# Patient Record
Sex: Female | Born: 1935
Health system: Southern US, Community
[De-identification: ages and names within clinical notes are randomized; demographics above are authoritative.]

## PROBLEM LIST (undated history)

## (undated) DIAGNOSIS — D649 Anemia, unspecified: Secondary | ICD-10-CM

## (undated) DIAGNOSIS — R233 Spontaneous ecchymoses: Secondary | ICD-10-CM

## (undated) DIAGNOSIS — R609 Edema, unspecified: Secondary | ICD-10-CM

## (undated) DIAGNOSIS — K219 Gastro-esophageal reflux disease without esophagitis: Secondary | ICD-10-CM

## (undated) DIAGNOSIS — D693 Immune thrombocytopenic purpura: Secondary | ICD-10-CM

## (undated) DIAGNOSIS — M1990 Unspecified osteoarthritis, unspecified site: Secondary | ICD-10-CM

## (undated) DIAGNOSIS — I219 Acute myocardial infarction, unspecified: Secondary | ICD-10-CM

## (undated) DIAGNOSIS — R238 Other skin changes: Secondary | ICD-10-CM

## (undated) DIAGNOSIS — C44511 Basal cell carcinoma of skin of breast: Secondary | ICD-10-CM

## (undated) DIAGNOSIS — R351 Nocturia: Secondary | ICD-10-CM

## (undated) DIAGNOSIS — I639 Cerebral infarction, unspecified: Secondary | ICD-10-CM

## (undated) DIAGNOSIS — H409 Unspecified glaucoma: Secondary | ICD-10-CM

## (undated) DIAGNOSIS — E119 Type 2 diabetes mellitus without complications: Secondary | ICD-10-CM

## (undated) DIAGNOSIS — Z8719 Personal history of other diseases of the digestive system: Secondary | ICD-10-CM

## (undated) DIAGNOSIS — R600 Localized edema: Secondary | ICD-10-CM

## (undated) DIAGNOSIS — R42 Dizziness and giddiness: Secondary | ICD-10-CM

## (undated) DIAGNOSIS — J439 Emphysema, unspecified: Secondary | ICD-10-CM

## (undated) DIAGNOSIS — E785 Hyperlipidemia, unspecified: Secondary | ICD-10-CM

## (undated) DIAGNOSIS — I10 Essential (primary) hypertension: Secondary | ICD-10-CM

## (undated) DIAGNOSIS — L309 Dermatitis, unspecified: Secondary | ICD-10-CM

## (undated) DIAGNOSIS — Z9989 Dependence on other enabling machines and devices: Secondary | ICD-10-CM

## (undated) HISTORY — PX: BREAST EXCISIONAL BIOPSY: SUR124

## (undated) HISTORY — DX: Basal cell carcinoma of skin of breast: C44.511

## (undated) HISTORY — PX: ESOPHAGOGASTRODUODENOSCOPY: SHX1529

## (undated) HISTORY — PX: BREAST LUMPECTOMY: SHX2

## (undated) HISTORY — DX: Unspecified glaucoma: H40.9

## (undated) HISTORY — DX: Essential (primary) hypertension: I10

## (undated) HISTORY — DX: Cerebral infarction, unspecified: I63.9

## (undated) HISTORY — DX: Type 2 diabetes mellitus without complications: E11.9

## (undated) HISTORY — DX: Hyperlipidemia, unspecified: E78.5

## (undated) HISTORY — PX: COLONOSCOPY: SHX174

## (undated) HISTORY — PX: TONSILLECTOMY: SUR1361

## (undated) HISTORY — PX: CATARACT EXTRACTION: SUR2

## (undated) HISTORY — PX: REFRACTIVE SURGERY: SHX103

## (undated) HISTORY — DX: Immune thrombocytopenic purpura: D69.3

---

## 1995-03-14 HISTORY — PX: MYOMECTOMY: SHX85

## 1996-02-07 DIAGNOSIS — C44511 Basal cell carcinoma of skin of breast: Secondary | ICD-10-CM

## 1996-02-07 HISTORY — DX: Basal cell carcinoma of skin of breast: C44.511

## 1997-07-29 ENCOUNTER — Ambulatory Visit (HOSPITAL_COMMUNITY): Admission: RE | Admit: 1997-07-29 | Discharge: 1997-07-29 | Payer: Self-pay | Admitting: Obstetrics and Gynecology

## 1997-08-27 ENCOUNTER — Other Ambulatory Visit: Admission: RE | Admit: 1997-08-27 | Discharge: 1997-08-27 | Payer: Self-pay | Admitting: Obstetrics and Gynecology

## 1998-08-12 ENCOUNTER — Encounter: Payer: Self-pay | Admitting: *Deleted

## 1998-08-12 ENCOUNTER — Ambulatory Visit (HOSPITAL_COMMUNITY): Admission: RE | Admit: 1998-08-12 | Discharge: 1998-08-12 | Payer: Self-pay | Admitting: *Deleted

## 1999-03-02 ENCOUNTER — Encounter (INDEPENDENT_AMBULATORY_CARE_PROVIDER_SITE_OTHER): Payer: Self-pay

## 1999-03-02 ENCOUNTER — Other Ambulatory Visit: Admission: RE | Admit: 1999-03-02 | Discharge: 1999-03-02 | Payer: Self-pay | Admitting: Obstetrics and Gynecology

## 1999-03-31 ENCOUNTER — Ambulatory Visit (HOSPITAL_COMMUNITY): Admission: RE | Admit: 1999-03-31 | Discharge: 1999-03-31 | Payer: Self-pay | Admitting: Obstetrics and Gynecology

## 1999-03-31 ENCOUNTER — Encounter (INDEPENDENT_AMBULATORY_CARE_PROVIDER_SITE_OTHER): Payer: Self-pay | Admitting: Specialist

## 2002-10-29 ENCOUNTER — Ambulatory Visit (HOSPITAL_COMMUNITY): Admission: RE | Admit: 2002-10-29 | Discharge: 2002-10-29 | Payer: Self-pay | Admitting: *Deleted

## 2003-10-23 ENCOUNTER — Encounter: Admission: RE | Admit: 2003-10-23 | Discharge: 2003-10-23 | Payer: Self-pay | Admitting: Obstetrics and Gynecology

## 2006-02-06 HISTORY — PX: PARATHYROIDECTOMY: SHX19

## 2006-04-19 ENCOUNTER — Ambulatory Visit (HOSPITAL_COMMUNITY): Admission: RE | Admit: 2006-04-19 | Discharge: 2006-04-19 | Payer: Self-pay | Admitting: Hematology and Oncology

## 2006-07-05 ENCOUNTER — Ambulatory Visit (HOSPITAL_COMMUNITY): Admission: RE | Admit: 2006-07-05 | Discharge: 2006-07-06 | Payer: Self-pay | Admitting: Surgery

## 2006-07-05 ENCOUNTER — Encounter (INDEPENDENT_AMBULATORY_CARE_PROVIDER_SITE_OTHER): Payer: Self-pay | Admitting: Surgery

## 2006-08-09 ENCOUNTER — Encounter: Admission: RE | Admit: 2006-08-09 | Discharge: 2006-08-09 | Payer: Self-pay | Admitting: Internal Medicine

## 2007-03-10 DIAGNOSIS — D693 Immune thrombocytopenic purpura: Secondary | ICD-10-CM

## 2007-03-10 HISTORY — DX: Immune thrombocytopenic purpura: D69.3

## 2007-04-01 ENCOUNTER — Ambulatory Visit: Payer: Self-pay | Admitting: Oncology

## 2007-04-09 LAB — CHCC SMEAR

## 2007-04-09 LAB — COMPREHENSIVE METABOLIC PANEL
ALT: 12 U/L (ref 0–35)
AST: 17 U/L (ref 0–37)
CO2: 25 mEq/L (ref 19–32)
Calcium: 9.1 mg/dL (ref 8.4–10.5)
Chloride: 105 mEq/L (ref 96–112)
Sodium: 141 mEq/L (ref 135–145)
Total Protein: 6.9 g/dL (ref 6.0–8.3)

## 2007-04-09 LAB — CBC WITH DIFFERENTIAL/PLATELET
Basophils Absolute: 0 10*3/uL (ref 0.0–0.1)
EOS%: 0.8 % (ref 0.0–7.0)
Eosinophils Absolute: 0.1 10*3/uL (ref 0.0–0.5)
HCT: 42 % (ref 34.8–46.6)
HGB: 14.5 g/dL (ref 11.6–15.9)
MONO#: 0.5 10*3/uL (ref 0.1–0.9)
NEUT#: 4.3 10*3/uL (ref 1.5–6.5)
NEUT%: 65 % (ref 39.6–76.8)
RDW: 13.1 % (ref 11.3–14.5)
WBC: 6.6 10*3/uL (ref 3.9–10.0)
lymph#: 1.7 10*3/uL (ref 0.9–3.3)

## 2007-04-09 LAB — RHEUMATOID FACTOR: Rhuematoid fact SerPl-aCnc: <20 IU/mL (ref 0–20)

## 2007-04-09 LAB — LACTATE DEHYDROGENASE: LDH: 227 U/L (ref 94–250)

## 2007-04-10 LAB — ANA: Anti Nuclear Antibody(ANA): POSITIVE — AB

## 2007-04-10 LAB — PROTHROMBIN TIME: INR: 1 (ref 0.0–1.5)

## 2007-04-10 LAB — ANTI-NUCLEAR AB-TITER (ANA TITER)

## 2007-04-12 LAB — BASIC METABOLIC PANEL
CO2: 23 mEq/L (ref 19–32)
Chloride: 106 mEq/L (ref 96–112)
Creatinine, Ser: 0.95 mg/dL (ref 0.40–1.20)
Potassium: 4.2 mEq/L (ref 3.5–5.3)
Sodium: 141 mEq/L (ref 135–145)

## 2007-04-12 LAB — CBC WITH DIFFERENTIAL/PLATELET
BASO%: 0.3 % (ref 0.0–2.0)
EOS%: 0.2 % (ref 0.0–7.0)
HCT: 41.1 % (ref 34.8–46.6)
MCH: 29.6 pg (ref 26.0–34.0)
MCHC: 35.1 g/dL (ref 32.0–36.0)
MONO#: 0.2 10*3/uL (ref 0.1–0.9)
NEUT%: 84.5 % — ABNORMAL HIGH (ref 39.6–76.8)
RDW: 13.6 % (ref 11.3–14.5)
WBC: 7.3 10*3/uL (ref 3.9–10.0)
lymph#: 0.9 10*3/uL (ref 0.9–3.3)

## 2007-04-15 LAB — CBC WITH DIFFERENTIAL/PLATELET
Basophils Absolute: 0 10*3/uL (ref 0.0–0.1)
EOS%: 0.1 % (ref 0.0–7.0)
Eosinophils Absolute: 0 10*3/uL (ref 0.0–0.5)
HCT: 41.1 % (ref 34.8–46.6)
HGB: 14.3 g/dL (ref 11.6–15.9)
LYMPH%: 14.7 % (ref 14.0–48.0)
MCH: 29.5 pg (ref 26.0–34.0)
MCV: 85.1 fL (ref 81.0–101.0)
MONO%: 1.1 % (ref 0.0–13.0)
NEUT#: 6.9 10*3/uL — ABNORMAL HIGH (ref 1.5–6.5)
NEUT%: 83.9 % — ABNORMAL HIGH (ref 39.6–76.8)
Platelets: 89 10*3/uL — ABNORMAL LOW (ref 145–400)
RDW: 13.6 % (ref 11.3–14.5)

## 2007-04-15 LAB — BASIC METABOLIC PANEL
BUN: 17 mg/dL (ref 6–23)
CO2: 22 mEq/L (ref 19–32)
Chloride: 104 mEq/L (ref 96–112)
Creatinine, Ser: 0.83 mg/dL (ref 0.40–1.20)
Potassium: 4.3 mEq/L (ref 3.5–5.3)

## 2007-04-18 LAB — BASIC METABOLIC PANEL
BUN: 15 mg/dL (ref 6–23)
Calcium: 8.7 mg/dL (ref 8.4–10.5)
Creatinine, Ser: 0.77 mg/dL (ref 0.40–1.20)
Glucose, Bld: 154 mg/dL — ABNORMAL HIGH (ref 70–99)

## 2007-04-18 LAB — CBC WITH DIFFERENTIAL/PLATELET
Basophils Absolute: 0 10*3/uL (ref 0.0–0.1)
EOS%: 0.9 % (ref 0.0–7.0)
Eosinophils Absolute: 0.1 10*3/uL (ref 0.0–0.5)
HGB: 13.9 g/dL (ref 11.6–15.9)
LYMPH%: 19.9 % (ref 14.0–48.0)
MCH: 29 pg (ref 26.0–34.0)
MCV: 85.1 fL (ref 81.0–101.0)
MONO%: 5.7 % (ref 0.0–13.0)
NEUT#: 6.1 10*3/uL (ref 1.5–6.5)
Platelets: 86 10*3/uL — ABNORMAL LOW (ref 145–400)

## 2007-04-25 LAB — CBC WITH DIFFERENTIAL/PLATELET
BASO%: 0.3 % (ref 0.0–2.0)
LYMPH%: 23.2 % (ref 14.0–48.0)
MCHC: 34.4 g/dL (ref 32.0–36.0)
MONO#: 0.6 10*3/uL (ref 0.1–0.9)
NEUT#: 6.4 10*3/uL (ref 1.5–6.5)
Platelets: 20 10*3/uL — ABNORMAL LOW (ref 145–400)
RBC: 4.99 10*6/uL (ref 3.70–5.32)
RDW: 13.7 % (ref 11.3–14.5)
WBC: 9.3 10*3/uL (ref 3.9–10.0)
lymph#: 2.2 10*3/uL (ref 0.9–3.3)

## 2007-05-09 LAB — CBC WITH DIFFERENTIAL/PLATELET
Basophils Absolute: 0 10*3/uL (ref 0.0–0.1)
EOS%: 0.1 % (ref 0.0–7.0)
HCT: 41.8 % (ref 34.8–46.6)
HGB: 14.2 g/dL (ref 11.6–15.9)
LYMPH%: 12.1 % — ABNORMAL LOW (ref 14.0–48.0)
MCH: 29 pg (ref 26.0–34.0)
MCV: 85.3 fL (ref 81.0–101.0)
MONO%: 2.1 % (ref 0.0–13.0)
NEUT%: 85.3 % — ABNORMAL HIGH (ref 39.6–76.8)
Platelets: 33 10*3/uL — ABNORMAL LOW (ref 145–400)

## 2007-05-09 LAB — COMPREHENSIVE METABOLIC PANEL
AST: 12 U/L (ref 0–37)
BUN: 16 mg/dL (ref 6–23)
Calcium: 9.5 mg/dL (ref 8.4–10.5)
Chloride: 102 mEq/L (ref 96–112)
Creatinine, Ser: 0.86 mg/dL (ref 0.40–1.20)

## 2007-05-14 LAB — COMPREHENSIVE METABOLIC PANEL
Alkaline Phosphatase: 46 U/L (ref 39–117)
Creatinine, Ser: 0.86 mg/dL (ref 0.40–1.20)
Glucose, Bld: 242 mg/dL — ABNORMAL HIGH (ref 70–99)
Sodium: 138 mEq/L (ref 135–145)
Total Bilirubin: 0.5 mg/dL (ref 0.3–1.2)
Total Protein: 6.7 g/dL (ref 6.0–8.3)

## 2007-05-14 LAB — CBC WITH DIFFERENTIAL/PLATELET
BASO%: 0.4 % (ref 0.0–2.0)
Eosinophils Absolute: 0 10*3/uL (ref 0.0–0.5)
LYMPH%: 14.9 % (ref 14.0–48.0)
MCHC: 34.7 g/dL (ref 32.0–36.0)
MCV: 85.4 fL (ref 81.0–101.0)
MONO%: 2.2 % (ref 0.0–13.0)
Platelets: 24 10*3/uL — ABNORMAL LOW (ref 145–400)
RBC: 5 10*6/uL (ref 3.70–5.32)

## 2007-05-16 ENCOUNTER — Ambulatory Visit: Payer: Self-pay | Admitting: Oncology

## 2007-05-21 ENCOUNTER — Other Ambulatory Visit: Admission: RE | Admit: 2007-05-21 | Discharge: 2007-05-21 | Payer: Self-pay | Admitting: Oncology

## 2007-05-21 ENCOUNTER — Encounter (HOSPITAL_COMMUNITY): Payer: Self-pay | Admitting: Oncology

## 2007-05-21 LAB — CBC WITH DIFFERENTIAL/PLATELET
BASO%: 0.5 % (ref 0.0–2.0)
Basophils Absolute: 0 10*3/uL (ref 0.0–0.1)
Eosinophils Absolute: 0.1 10*3/uL (ref 0.0–0.5)
HCT: 42.4 % (ref 34.8–46.6)
HGB: 14.5 g/dL (ref 11.6–15.9)
LYMPH%: 23.3 % (ref 14.0–48.0)
MCHC: 34.1 g/dL (ref 32.0–36.0)
MONO#: 0.4 10*3/uL (ref 0.1–0.9)
NEUT%: 70.8 % (ref 39.6–76.8)
Platelets: 32 10*3/uL — ABNORMAL LOW (ref 145–400)
WBC: 8 10*3/uL (ref 3.9–10.0)

## 2007-05-21 LAB — COMPREHENSIVE METABOLIC PANEL
BUN: 18 mg/dL (ref 6–23)
CO2: 27 mEq/L (ref 19–32)
Calcium: 9.2 mg/dL (ref 8.4–10.5)
Chloride: 99 mEq/L (ref 96–112)
Creatinine, Ser: 0.92 mg/dL (ref 0.40–1.20)
Glucose, Bld: 174 mg/dL — ABNORMAL HIGH (ref 70–99)
Total Bilirubin: 0.6 mg/dL (ref 0.3–1.2)

## 2007-05-21 LAB — LACTATE DEHYDROGENASE: LDH: 227 U/L (ref 94–250)

## 2007-05-29 LAB — LACTATE DEHYDROGENASE: LDH: 241 U/L (ref 94–250)

## 2007-05-29 LAB — CBC WITH DIFFERENTIAL/PLATELET
Eosinophils Absolute: 0.1 10*3/uL (ref 0.0–0.5)
HCT: 44.3 % (ref 34.8–46.6)
HGB: 14.8 g/dL (ref 11.6–15.9)
LYMPH%: 16.8 % (ref 14.0–48.0)
MONO#: 0.4 10*3/uL (ref 0.1–0.9)
NEUT#: 6.2 10*3/uL (ref 1.5–6.5)
NEUT%: 76 % (ref 39.6–76.8)
Platelets: 156 10*3/uL (ref 145–400)
WBC: 8.2 10*3/uL (ref 3.9–10.0)
lymph#: 1.4 10*3/uL (ref 0.9–3.3)

## 2007-05-29 LAB — COMPREHENSIVE METABOLIC PANEL
ALT: 15 U/L (ref 0–35)
CO2: 22 mEq/L (ref 19–32)
Calcium: 9.1 mg/dL (ref 8.4–10.5)
Chloride: 101 mEq/L (ref 96–112)
Creatinine, Ser: 0.86 mg/dL (ref 0.40–1.20)
Glucose, Bld: 203 mg/dL — ABNORMAL HIGH (ref 70–99)
Total Bilirubin: 0.8 mg/dL (ref 0.3–1.2)
Total Protein: 6.9 g/dL (ref 6.0–8.3)

## 2007-06-05 LAB — CBC WITH DIFFERENTIAL/PLATELET
BASO%: 2 % (ref 0.0–2.0)
EOS%: 0.2 % (ref 0.0–7.0)
HCT: 43.9 % (ref 34.8–46.6)
MCH: 29.1 pg (ref 26.0–34.0)
MCHC: 33.5 g/dL (ref 32.0–36.0)
MONO#: 0.5 10*3/uL (ref 0.1–0.9)
NEUT%: 84.6 % — ABNORMAL HIGH (ref 39.6–76.8)
RDW: 13 % (ref 11.3–14.5)
WBC: 8.8 10*3/uL (ref 3.9–10.0)
lymph#: 0.7 10*3/uL — ABNORMAL LOW (ref 0.9–3.3)

## 2007-06-12 LAB — COMPREHENSIVE METABOLIC PANEL
ALT: 17 U/L (ref 0–35)
Alkaline Phosphatase: 40 U/L (ref 39–117)
CO2: 23 mEq/L (ref 19–32)
Creatinine, Ser: 0.75 mg/dL (ref 0.40–1.20)
Glucose, Bld: 204 mg/dL — ABNORMAL HIGH (ref 70–99)
Total Bilirubin: 0.6 mg/dL (ref 0.3–1.2)

## 2007-06-12 LAB — CBC WITH DIFFERENTIAL/PLATELET
BASO%: 0.7 % (ref 0.0–2.0)
Basophils Absolute: 0.1 10*3/uL (ref 0.0–0.1)
EOS%: 0.7 % (ref 0.0–7.0)
HCT: 41.1 % (ref 34.8–46.6)
HGB: 14 g/dL (ref 11.6–15.9)
LYMPH%: 14.1 % (ref 14.0–48.0)
MCH: 29.2 pg (ref 26.0–34.0)
MCHC: 34 g/dL (ref 32.0–36.0)
MCV: 85.7 fL (ref 81.0–101.0)
NEUT%: 80.9 % — ABNORMAL HIGH (ref 39.6–76.8)
Platelets: 79 10*3/uL — ABNORMAL LOW (ref 145–400)

## 2007-06-12 LAB — LACTATE DEHYDROGENASE: LDH: 253 U/L — ABNORMAL HIGH (ref 94–250)

## 2007-07-02 ENCOUNTER — Ambulatory Visit: Payer: Self-pay | Admitting: Oncology

## 2007-07-04 LAB — CBC WITH DIFFERENTIAL/PLATELET
Basophils Absolute: 0.1 10*3/uL (ref 0.0–0.1)
HCT: 41.2 % (ref 34.8–46.6)
HGB: 14 g/dL (ref 11.6–15.9)
MONO#: 0.4 10*3/uL (ref 0.1–0.9)
NEUT%: 73.2 % (ref 39.6–76.8)
Platelets: 94 10*3/uL — ABNORMAL LOW (ref 145–400)
WBC: 7 10*3/uL (ref 3.9–10.0)
lymph#: 1.3 10*3/uL (ref 0.9–3.3)

## 2007-07-11 LAB — CBC WITH DIFFERENTIAL/PLATELET
BASO%: 1.3 % (ref 0.0–2.0)
Basophils Absolute: 0.1 10*3/uL (ref 0.0–0.1)
EOS%: 0.6 % (ref 0.0–7.0)
HGB: 14.8 g/dL (ref 11.6–15.9)
MCH: 30 pg (ref 26.0–34.0)
MCHC: 34.9 g/dL (ref 32.0–36.0)
RDW: 13.4 % (ref 11.3–14.5)
WBC: 9.5 10*3/uL (ref 3.9–10.0)
lymph#: 1.6 10*3/uL (ref 0.9–3.3)

## 2007-07-18 LAB — CBC WITH DIFFERENTIAL/PLATELET
Basophils Absolute: 0.1 10*3/uL (ref 0.0–0.1)
Eosinophils Absolute: 0 10*3/uL (ref 0.0–0.5)
HGB: 14.2 g/dL (ref 11.6–15.9)
NEUT#: 6.5 10*3/uL (ref 1.5–6.5)
RDW: 13.8 % (ref 11.3–14.5)
lymph#: 1.4 10*3/uL (ref 0.9–3.3)

## 2007-07-25 LAB — COMPREHENSIVE METABOLIC PANEL
Alkaline Phosphatase: 43 U/L (ref 39–117)
Creatinine, Ser: 0.82 mg/dL (ref 0.40–1.20)
Glucose, Bld: 196 mg/dL — ABNORMAL HIGH (ref 70–99)
Sodium: 140 mEq/L (ref 135–145)
Total Bilirubin: 0.6 mg/dL (ref 0.3–1.2)
Total Protein: 6.7 g/dL (ref 6.0–8.3)

## 2007-07-25 LAB — CBC WITH DIFFERENTIAL/PLATELET
Eosinophils Absolute: 0 10*3/uL (ref 0.0–0.5)
LYMPH%: 12.7 % — ABNORMAL LOW (ref 14.0–48.0)
MCHC: 34.8 g/dL (ref 32.0–36.0)
MCV: 86 fL (ref 81.0–101.0)
MONO%: 3.9 % (ref 0.0–13.0)
Platelets: 118 10*3/uL — ABNORMAL LOW (ref 145–400)
RBC: 4.82 10*6/uL (ref 3.70–5.32)

## 2007-08-01 LAB — CBC WITH DIFFERENTIAL/PLATELET
Basophils Absolute: 0.1 10*3/uL (ref 0.0–0.1)
Eosinophils Absolute: 0.1 10*3/uL (ref 0.0–0.5)
HCT: 41.7 % (ref 34.8–46.6)
HGB: 14.5 g/dL (ref 11.6–15.9)
LYMPH%: 13.5 % — ABNORMAL LOW (ref 14.0–48.0)
MCHC: 34.8 g/dL (ref 32.0–36.0)
MONO#: 0.5 10*3/uL (ref 0.1–0.9)
NEUT#: 7.4 10*3/uL — ABNORMAL HIGH (ref 1.5–6.5)
NEUT%: 79.2 % — ABNORMAL HIGH (ref 39.6–76.8)
Platelets: 119 10*3/uL — ABNORMAL LOW (ref 145–400)
WBC: 9.3 10*3/uL (ref 3.9–10.0)

## 2007-08-08 LAB — CBC WITH DIFFERENTIAL/PLATELET
EOS%: 1 % (ref 0.0–7.0)
Eosinophils Absolute: 0.1 10*3/uL (ref 0.0–0.5)
LYMPH%: 15.5 % (ref 14.0–48.0)
MCH: 30.4 pg (ref 26.0–34.0)
MCHC: 34.6 g/dL (ref 32.0–36.0)
MCV: 87.9 fL (ref 81.0–101.0)
MONO%: 8 % (ref 0.0–13.0)
Platelets: 136 10*3/uL — ABNORMAL LOW (ref 145–400)
RBC: 4.91 10*6/uL (ref 3.70–5.32)
RDW: 13.8 % (ref 11.3–14.5)

## 2007-08-12 ENCOUNTER — Ambulatory Visit: Payer: Self-pay | Admitting: Oncology

## 2007-08-15 LAB — CBC WITH DIFFERENTIAL/PLATELET
BASO%: 1.2 % (ref 0.0–2.0)
LYMPH%: 13.8 % — ABNORMAL LOW (ref 14.0–48.0)
MCHC: 34.4 g/dL (ref 32.0–36.0)
MONO#: 0.5 10*3/uL (ref 0.1–0.9)
MONO%: 5.1 % (ref 0.0–13.0)
Platelets: 140 10*3/uL — ABNORMAL LOW (ref 145–400)
RBC: 4.74 10*6/uL (ref 3.70–5.32)
RDW: 13.4 % (ref 11.3–14.5)
WBC: 9.7 10*3/uL (ref 3.9–10.0)

## 2007-08-22 LAB — CBC WITH DIFFERENTIAL/PLATELET
BASO%: 0.8 % (ref 0.0–2.0)
Eosinophils Absolute: 0 10*3/uL (ref 0.0–0.5)
HCT: 40 % (ref 34.8–46.6)
MCHC: 34.3 g/dL (ref 32.0–36.0)
MONO#: 0.4 10*3/uL (ref 0.1–0.9)
NEUT#: 9.1 10*3/uL — ABNORMAL HIGH (ref 1.5–6.5)
RBC: 4.51 10*6/uL (ref 3.70–5.32)
WBC: 10.6 10*3/uL — ABNORMAL HIGH (ref 3.9–10.0)
lymph#: 1.1 10*3/uL (ref 0.9–3.3)

## 2007-08-29 LAB — CBC WITH DIFFERENTIAL/PLATELET
BASO%: 0.3 % (ref 0.0–2.0)
Eosinophils Absolute: 0.1 10*3/uL (ref 0.0–0.5)
MCHC: 34.1 g/dL (ref 32.0–36.0)
MONO#: 0.7 10*3/uL (ref 0.1–0.9)
NEUT#: 8.1 10*3/uL — ABNORMAL HIGH (ref 1.5–6.5)
RBC: 4.49 10*6/uL (ref 3.70–5.32)
WBC: 10.2 10*3/uL — ABNORMAL HIGH (ref 3.9–10.0)
lymph#: 1.3 10*3/uL (ref 0.9–3.3)

## 2007-08-29 LAB — COMPREHENSIVE METABOLIC PANEL
ALT: 13 U/L (ref 0–35)
Albumin: 4.2 g/dL (ref 3.5–5.2)
CO2: 25 mEq/L (ref 19–32)
Calcium: 9.8 mg/dL (ref 8.4–10.5)
Chloride: 101 mEq/L (ref 96–112)
Glucose, Bld: 104 mg/dL — ABNORMAL HIGH (ref 70–99)
Potassium: 3.7 mEq/L (ref 3.5–5.3)
Sodium: 139 mEq/L (ref 135–145)
Total Bilirubin: 0.5 mg/dL (ref 0.3–1.2)
Total Protein: 6.4 g/dL (ref 6.0–8.3)

## 2007-08-29 LAB — LACTATE DEHYDROGENASE: LDH: 244 U/L (ref 94–250)

## 2007-09-05 LAB — CBC WITH DIFFERENTIAL/PLATELET
BASO%: 0.9 % (ref 0.0–2.0)
EOS%: 0.6 % (ref 0.0–7.0)
HCT: 40.1 % (ref 34.8–46.6)
MCH: 30.5 pg (ref 26.0–34.0)
MCHC: 34.5 g/dL (ref 32.0–36.0)
NEUT%: 79.5 % — ABNORMAL HIGH (ref 39.6–76.8)
RBC: 4.54 10*6/uL (ref 3.70–5.32)
lymph#: 1.1 10*3/uL (ref 0.9–3.3)

## 2007-09-12 LAB — CBC WITH DIFFERENTIAL/PLATELET
BASO%: 0.6 % (ref 0.0–2.0)
EOS%: 0.3 % (ref 0.0–7.0)
HCT: 40.2 % (ref 34.8–46.6)
LYMPH%: 11.4 % — ABNORMAL LOW (ref 14.0–48.0)
MCH: 30.4 pg (ref 26.0–34.0)
MCHC: 34.1 g/dL (ref 32.0–36.0)
MCV: 89.2 fL (ref 81.0–101.0)
MONO%: 3.9 % (ref 0.0–13.0)
NEUT%: 83.8 % — ABNORMAL HIGH (ref 39.6–76.8)
Platelets: 138 10*3/uL — ABNORMAL LOW (ref 145–400)
RBC: 4.51 10*6/uL (ref 3.70–5.32)

## 2007-09-26 LAB — CBC WITH DIFFERENTIAL/PLATELET
Basophils Absolute: 0.1 10*3/uL (ref 0.0–0.1)
EOS%: 0.2 % (ref 0.0–7.0)
Eosinophils Absolute: 0 10*3/uL (ref 0.0–0.5)
HCT: 41.6 % (ref 34.8–46.6)
HGB: 14.2 g/dL (ref 11.6–15.9)
MCH: 30.1 pg (ref 26.0–34.0)
MCV: 88.1 fL (ref 81.0–101.0)
NEUT#: 7.8 10*3/uL — ABNORMAL HIGH (ref 1.5–6.5)
NEUT%: 81.1 % — ABNORMAL HIGH (ref 39.6–76.8)
RDW: 12.5 % (ref 11.3–14.5)
lymph#: 1.4 10*3/uL (ref 0.9–3.3)

## 2007-10-03 ENCOUNTER — Ambulatory Visit: Payer: Self-pay | Admitting: Oncology

## 2007-10-03 LAB — CBC WITH DIFFERENTIAL/PLATELET
Basophils Absolute: 0.1 10*3/uL (ref 0.0–0.1)
EOS%: 0.3 % (ref 0.0–7.0)
Eosinophils Absolute: 0 10*3/uL (ref 0.0–0.5)
HGB: 14 g/dL (ref 11.6–15.9)
LYMPH%: 11.7 % — ABNORMAL LOW (ref 14.0–48.0)
MCH: 30.1 pg (ref 26.0–34.0)
MCV: 88.5 fL (ref 81.0–101.0)
MONO%: 5.2 % (ref 0.0–13.0)
NEUT#: 8.4 10*3/uL — ABNORMAL HIGH (ref 1.5–6.5)
NEUT%: 81.9 % — ABNORMAL HIGH (ref 39.6–76.8)
Platelets: 149 10*3/uL (ref 145–400)

## 2007-10-10 LAB — CBC WITH DIFFERENTIAL/PLATELET
Basophils Absolute: 0.1 10*3/uL (ref 0.0–0.1)
Eosinophils Absolute: 0 10*3/uL (ref 0.0–0.5)
HGB: 13.6 g/dL (ref 11.6–15.9)
MCV: 86.8 fL (ref 81.0–101.0)
MONO#: 0.2 10*3/uL (ref 0.1–0.9)
MONO%: 2.7 % (ref 0.0–13.0)
NEUT#: 7.1 10*3/uL — ABNORMAL HIGH (ref 1.5–6.5)
RDW: 12.1 % (ref 11.3–14.5)
WBC: 8.4 10*3/uL (ref 3.9–10.0)

## 2007-10-15 LAB — COMPREHENSIVE METABOLIC PANEL
Alkaline Phosphatase: 46 U/L (ref 39–117)
BUN: 14 mg/dL (ref 6–23)
CO2: 26 mEq/L (ref 19–32)
Glucose, Bld: 102 mg/dL — ABNORMAL HIGH (ref 70–99)
Total Bilirubin: 0.4 mg/dL (ref 0.3–1.2)

## 2007-10-15 LAB — CBC WITH DIFFERENTIAL/PLATELET
Basophils Absolute: 0 10*3/uL (ref 0.0–0.1)
Eosinophils Absolute: 0 10*3/uL (ref 0.0–0.5)
HCT: 38.8 % (ref 34.8–46.6)
HGB: 13.3 g/dL (ref 11.6–15.9)
LYMPH%: 13.6 % — ABNORMAL LOW (ref 14.0–48.0)
MCV: 88.9 fL (ref 81.0–101.0)
MONO%: 5.8 % (ref 0.0–13.0)
NEUT#: 6.5 10*3/uL (ref 1.5–6.5)
NEUT%: 79.8 % — ABNORMAL HIGH (ref 39.6–76.8)
Platelets: 119 10*3/uL — ABNORMAL LOW (ref 145–400)

## 2007-10-24 LAB — CBC WITH DIFFERENTIAL/PLATELET
EOS%: 0.1 % (ref 0.0–7.0)
Eosinophils Absolute: 0 10*3/uL (ref 0.0–0.5)
LYMPH%: 11.6 % — ABNORMAL LOW (ref 14.0–48.0)
MCH: 29.8 pg (ref 26.0–34.0)
MCV: 88 fL (ref 81.0–101.0)
MONO%: 2.7 % (ref 0.0–13.0)
Platelets: 160 10*3/uL (ref 145–400)
RBC: 4.66 10*6/uL (ref 3.70–5.32)
RDW: 13.1 % (ref 11.3–14.5)

## 2007-10-31 LAB — CBC WITH DIFFERENTIAL/PLATELET
BASO%: 0.3 % (ref 0.0–2.0)
Eosinophils Absolute: 0 10*3/uL (ref 0.0–0.5)
LYMPH%: 12.6 % — ABNORMAL LOW (ref 14.0–48.0)
MCHC: 33.8 g/dL (ref 32.0–36.0)
MCV: 88 fL (ref 81.0–101.0)
MONO#: 0.4 10*3/uL (ref 0.1–0.9)
MONO%: 4.6 % (ref 0.0–13.0)
NEUT#: 7.5 10*3/uL — ABNORMAL HIGH (ref 1.5–6.5)
Platelets: 143 10*3/uL — ABNORMAL LOW (ref 145–400)
RBC: 4.7 10*6/uL (ref 3.70–5.32)
RDW: 13.2 % (ref 11.3–14.5)
WBC: 9.1 10*3/uL (ref 3.9–10.0)

## 2007-10-31 LAB — COMPREHENSIVE METABOLIC PANEL
ALT: 15 U/L (ref 0–35)
Albumin: 4.2 g/dL (ref 3.5–5.2)
Alkaline Phosphatase: 49 U/L (ref 39–117)
Potassium: 4.1 mEq/L (ref 3.5–5.3)
Sodium: 139 mEq/L (ref 135–145)
Total Bilirubin: 0.4 mg/dL (ref 0.3–1.2)
Total Protein: 6.7 g/dL (ref 6.0–8.3)

## 2007-11-07 LAB — CBC WITH DIFFERENTIAL/PLATELET
BASO%: 0.5 % (ref 0.0–2.0)
Basophils Absolute: 0 10*3/uL (ref 0.0–0.1)
EOS%: 0.2 % (ref 0.0–7.0)
HCT: 40.5 % (ref 34.8–46.6)
HGB: 13.6 g/dL (ref 11.6–15.9)
LYMPH%: 12.2 % — ABNORMAL LOW (ref 14.0–48.0)
MCH: 28.8 pg (ref 26.0–34.0)
MCHC: 33.6 g/dL (ref 32.0–36.0)
MCV: 85.7 fL (ref 81.0–101.0)
MONO%: 3.7 % (ref 0.0–13.0)
NEUT%: 83.3 % — ABNORMAL HIGH (ref 39.6–76.8)

## 2007-11-14 LAB — CBC WITH DIFFERENTIAL/PLATELET
BASO%: 0.6 % (ref 0.0–2.0)
Basophils Absolute: 0 10*3/uL (ref 0.0–0.1)
EOS%: 0.5 % (ref 0.0–7.0)
MCH: 28.7 pg (ref 26.0–34.0)
MCHC: 33.7 g/dL (ref 32.0–36.0)
MCV: 85.2 fL (ref 81.0–101.0)
MONO%: 5.3 % (ref 0.0–13.0)
RBC: 4.64 10*6/uL (ref 3.70–5.32)
RDW: 12.6 % (ref 11.3–14.5)

## 2007-11-19 ENCOUNTER — Ambulatory Visit: Payer: Self-pay | Admitting: Oncology

## 2007-11-21 LAB — CBC WITH DIFFERENTIAL/PLATELET
Eosinophils Absolute: 0 10*3/uL (ref 0.0–0.5)
MCV: 85.6 fL (ref 81.0–101.0)
MONO%: 3.7 % (ref 0.0–13.0)
NEUT#: 7.4 10*3/uL — ABNORMAL HIGH (ref 1.5–6.5)
RBC: 4.88 10*6/uL (ref 3.70–5.32)
RDW: 12.7 % (ref 11.3–14.5)
WBC: 8.7 10*3/uL (ref 3.9–10.0)
lymph#: 1 10*3/uL (ref 0.9–3.3)

## 2007-11-28 LAB — CBC WITH DIFFERENTIAL/PLATELET
BASO%: 0.7 % (ref 0.0–2.0)
EOS%: 0.2 % (ref 0.0–7.0)
MCH: 28.6 pg (ref 26.0–34.0)
MCHC: 33.7 g/dL (ref 32.0–36.0)
RDW: 12.7 % (ref 11.3–14.5)
lymph#: 1.3 10*3/uL (ref 0.9–3.3)

## 2007-12-05 LAB — CBC WITH DIFFERENTIAL/PLATELET
Basophils Absolute: 0 10*3/uL (ref 0.0–0.1)
EOS%: 0.3 % (ref 0.0–7.0)
Eosinophils Absolute: 0 10*3/uL (ref 0.0–0.5)
HGB: 13.4 g/dL (ref 11.6–15.9)
MCH: 28.6 pg (ref 26.0–34.0)
NEUT#: 6.4 10*3/uL (ref 1.5–6.5)
RDW: 13 % (ref 11.3–14.5)
lymph#: 0.9 10*3/uL (ref 0.9–3.3)

## 2007-12-12 LAB — CBC WITH DIFFERENTIAL/PLATELET
BASO%: 1.1 % (ref 0.0–2.0)
Basophils Absolute: 0.1 10*3/uL (ref 0.0–0.1)
EOS%: 0.7 % (ref 0.0–7.0)
HCT: 41.5 % (ref 34.8–46.6)
HGB: 13.9 g/dL (ref 11.6–15.9)
LYMPH%: 15.9 % (ref 14.0–48.0)
MCH: 28.4 pg (ref 26.0–34.0)
MCHC: 33.5 g/dL (ref 32.0–36.0)
NEUT%: 74.7 % (ref 39.6–76.8)
Platelets: 158 10*3/uL (ref 145–400)

## 2007-12-19 LAB — CBC WITH DIFFERENTIAL/PLATELET
BASO%: 0.2 % (ref 0.0–2.0)
Basophils Absolute: 0 10*3/uL (ref 0.0–0.1)
EOS%: 0.2 % (ref 0.0–7.0)
HCT: 42.4 % (ref 34.8–46.6)
MCH: 28.8 pg (ref 26.0–34.0)
MCHC: 33.5 g/dL (ref 32.0–36.0)
MCV: 86.2 fL (ref 81.0–101.0)
MONO%: 4.6 % (ref 0.0–13.0)
NEUT%: 81.6 % — ABNORMAL HIGH (ref 39.6–76.8)
RDW: 14 % (ref 11.3–14.5)
lymph#: 1.1 10*3/uL (ref 0.9–3.3)

## 2007-12-19 LAB — COMPREHENSIVE METABOLIC PANEL
ALT: 16 U/L (ref 0–35)
AST: 16 U/L (ref 0–37)
Alkaline Phosphatase: 58 U/L (ref 39–117)
BUN: 17 mg/dL (ref 6–23)
Calcium: 9.4 mg/dL (ref 8.4–10.5)
Chloride: 100 mEq/L (ref 96–112)
Creatinine, Ser: 0.84 mg/dL (ref 0.40–1.20)

## 2007-12-24 LAB — CBC WITH DIFFERENTIAL/PLATELET
BASO%: 1.2 % (ref 0.0–2.0)
EOS%: 1.1 % (ref 0.0–7.0)
HCT: 38.4 % (ref 34.8–46.6)
MCH: 28.3 pg (ref 26.0–34.0)
MCHC: 33.7 g/dL (ref 32.0–36.0)
MONO#: 0.7 10*3/uL (ref 0.1–0.9)
NEUT%: 69 % (ref 39.6–76.8)
RBC: 4.57 10*6/uL (ref 3.70–5.32)
WBC: 7.7 10*3/uL (ref 3.9–10.0)
lymph#: 1.5 10*3/uL (ref 0.9–3.3)

## 2007-12-24 LAB — COMPREHENSIVE METABOLIC PANEL
ALT: 13 U/L (ref 0–35)
AST: 15 U/L (ref 0–37)
CO2: 25 mEq/L (ref 19–32)
Chloride: 101 mEq/L (ref 96–112)
Creatinine, Ser: 0.7 mg/dL (ref 0.40–1.20)
Sodium: 139 mEq/L (ref 135–145)
Total Bilirubin: 0.5 mg/dL (ref 0.3–1.2)
Total Protein: 6.3 g/dL (ref 6.0–8.3)

## 2008-01-07 ENCOUNTER — Ambulatory Visit: Payer: Self-pay | Admitting: Oncology

## 2008-01-09 LAB — CBC WITH DIFFERENTIAL/PLATELET
BASO%: 1.1 % (ref 0.0–2.0)
Basophils Absolute: 0.1 10*3/uL (ref 0.0–0.1)
EOS%: 1.2 % (ref 0.0–7.0)
HCT: 38.8 % (ref 34.8–46.6)
HGB: 12.9 g/dL (ref 11.6–15.9)
LYMPH%: 25.9 % (ref 14.0–48.0)
MCH: 27.9 pg (ref 26.0–34.0)
MCHC: 33.3 g/dL (ref 32.0–36.0)
MCV: 83.9 fL (ref 81.0–101.0)
MONO%: 8.7 % (ref 0.0–13.0)
NEUT%: 63 % (ref 39.6–76.8)
lymph#: 2.4 10*3/uL (ref 0.9–3.3)

## 2008-01-10 ENCOUNTER — Encounter: Admission: RE | Admit: 2008-01-10 | Discharge: 2008-01-10 | Payer: Self-pay | Admitting: Internal Medicine

## 2008-01-23 LAB — CBC WITH DIFFERENTIAL/PLATELET
Basophils Absolute: 0.1 10*3/uL (ref 0.0–0.1)
Eosinophils Absolute: 0.1 10*3/uL (ref 0.0–0.5)
HCT: 36.2 % (ref 34.8–46.6)
HGB: 12.4 g/dL (ref 11.6–15.9)
LYMPH%: 25.6 % (ref 14.0–48.0)
MONO#: 0.8 10*3/uL (ref 0.1–0.9)
NEUT#: 4.7 10*3/uL (ref 1.5–6.5)
NEUT%: 60.9 % (ref 39.6–76.8)
Platelets: 207 10*3/uL (ref 145–400)
WBC: 7.8 10*3/uL (ref 3.9–10.0)

## 2008-02-13 LAB — CBC WITH DIFFERENTIAL/PLATELET
Eosinophils Absolute: 0 10*3/uL (ref 0.0–0.5)
HCT: 37.5 % (ref 34.8–46.6)
HGB: 12.4 g/dL (ref 11.6–15.9)
LYMPH%: 13.3 % — ABNORMAL LOW (ref 14.0–48.0)
MONO#: 0.3 10*3/uL (ref 0.1–0.9)
NEUT#: 6.3 10*3/uL (ref 1.5–6.5)
NEUT%: 82.4 % — ABNORMAL HIGH (ref 39.6–76.8)
Platelets: 195 10*3/uL (ref 145–400)
WBC: 7.7 10*3/uL (ref 3.9–10.0)
lymph#: 1 10*3/uL (ref 0.9–3.3)

## 2008-03-03 ENCOUNTER — Ambulatory Visit: Payer: Self-pay | Admitting: Oncology

## 2008-03-05 LAB — CBC WITH DIFFERENTIAL/PLATELET
BASO%: 0.4 % (ref 0.0–2.0)
Basophils Absolute: 0 10*3/uL (ref 0.0–0.1)
EOS%: 1.5 % (ref 0.0–7.0)
HCT: 36.2 % (ref 34.8–46.6)
HGB: 12.1 g/dL (ref 11.6–15.9)
LYMPH%: 24.6 % (ref 14.0–48.0)
MCH: 27.2 pg (ref 26.0–34.0)
MCHC: 33.3 g/dL (ref 32.0–36.0)
MONO#: 0.6 10*3/uL (ref 0.1–0.9)
NEUT%: 64.7 % (ref 39.6–76.8)
Platelets: 174 10*3/uL (ref 145–400)

## 2008-03-26 LAB — CBC WITH DIFFERENTIAL/PLATELET
Basophils Absolute: 0 10*3/uL (ref 0.0–0.1)
Eosinophils Absolute: 0 10*3/uL (ref 0.0–0.5)
LYMPH%: 14 % (ref 14.0–48.0)
MCV: 80.5 fL — ABNORMAL LOW (ref 81.0–101.0)
MONO%: 4.3 % (ref 0.0–13.0)
NEUT#: 6 10*3/uL (ref 1.5–6.5)
NEUT%: 81.1 % — ABNORMAL HIGH (ref 39.6–76.8)
Platelets: 206 10*3/uL (ref 145–400)
RBC: 4.74 10*6/uL (ref 3.70–5.32)

## 2008-03-26 LAB — COMPREHENSIVE METABOLIC PANEL
Alkaline Phosphatase: 71 U/L (ref 39–117)
BUN: 16 mg/dL (ref 6–23)
Creatinine, Ser: 0.78 mg/dL (ref 0.40–1.20)
Glucose, Bld: 157 mg/dL — ABNORMAL HIGH (ref 70–99)
Sodium: 139 mEq/L (ref 135–145)
Total Bilirubin: 0.3 mg/dL (ref 0.3–1.2)

## 2008-04-16 LAB — CBC WITH DIFFERENTIAL/PLATELET
Basophils Absolute: 0 10*3/uL (ref 0.0–0.1)
Eosinophils Absolute: 0.1 10*3/uL (ref 0.0–0.5)
HCT: 36.5 % (ref 34.8–46.6)
HGB: 12 g/dL (ref 11.6–15.9)
LYMPH%: 22.5 % (ref 14.0–49.7)
MCV: 79.1 fL — ABNORMAL LOW (ref 79.5–101.0)
MONO%: 10.3 % (ref 0.0–14.0)
NEUT#: 4.6 10*3/uL (ref 1.5–6.5)
Platelets: 195 10*3/uL (ref 145–400)
RDW: 14.8 % — ABNORMAL HIGH (ref 11.2–14.5)

## 2008-04-16 LAB — COMPREHENSIVE METABOLIC PANEL
Albumin: 4 g/dL (ref 3.5–5.2)
Alkaline Phosphatase: 59 U/L (ref 39–117)
BUN: 14 mg/dL (ref 6–23)
Calcium: 8.9 mg/dL (ref 8.4–10.5)
Glucose, Bld: 76 mg/dL (ref 70–99)
Potassium: 4.1 mEq/L (ref 3.5–5.3)

## 2008-05-04 ENCOUNTER — Ambulatory Visit: Payer: Self-pay | Admitting: Oncology

## 2008-05-07 LAB — CBC WITH DIFFERENTIAL/PLATELET
Basophils Absolute: 0.1 10*3/uL (ref 0.0–0.1)
Eosinophils Absolute: 0 10*3/uL (ref 0.0–0.5)
HGB: 12.5 g/dL (ref 11.6–15.9)
LYMPH%: 18.9 % (ref 14.0–49.7)
MCV: 77.8 fL — ABNORMAL LOW (ref 79.5–101.0)
MONO#: 0.3 10*3/uL (ref 0.1–0.9)
MONO%: 5.4 % (ref 0.0–14.0)
NEUT#: 4.7 10*3/uL (ref 1.5–6.5)
Platelets: 136 10*3/uL — ABNORMAL LOW (ref 145–400)
RDW: 15 % — ABNORMAL HIGH (ref 11.2–14.5)

## 2008-05-28 LAB — COMPREHENSIVE METABOLIC PANEL
AST: 13 U/L (ref 0–37)
Alkaline Phosphatase: 64 U/L (ref 39–117)
BUN: 11 mg/dL (ref 6–23)
Creatinine, Ser: 0.72 mg/dL (ref 0.40–1.20)
Total Bilirubin: 0.3 mg/dL (ref 0.3–1.2)

## 2008-05-28 LAB — CBC WITH DIFFERENTIAL/PLATELET
EOS%: 1.5 % (ref 0.0–7.0)
Eosinophils Absolute: 0.1 10*3/uL (ref 0.0–0.5)
MCH: 25.2 pg (ref 25.1–34.0)
MCV: 78.2 fL — ABNORMAL LOW (ref 79.5–101.0)
MONO%: 8.9 % (ref 0.0–14.0)
NEUT#: 4.4 10*3/uL (ref 1.5–6.5)
RBC: 4.9 10*6/uL (ref 3.70–5.45)
RDW: 16.3 % — ABNORMAL HIGH (ref 11.2–14.5)
WBC: 6.9 10*3/uL (ref 3.9–10.3)
lymph#: 1.7 10*3/uL (ref 0.9–3.3)

## 2008-05-28 LAB — LACTATE DEHYDROGENASE: LDH: 185 U/L (ref 94–250)

## 2008-06-18 ENCOUNTER — Ambulatory Visit: Payer: Self-pay | Admitting: Oncology

## 2008-06-18 LAB — CBC WITH DIFFERENTIAL/PLATELET
Basophils Absolute: 0 10*3/uL (ref 0.0–0.1)
Eosinophils Absolute: 0.1 10*3/uL (ref 0.0–0.5)
HGB: 12.6 g/dL (ref 11.6–15.9)
NEUT#: 4 10*3/uL (ref 1.5–6.5)
RDW: 16.8 % — ABNORMAL HIGH (ref 11.2–14.5)
WBC: 6.3 10*3/uL (ref 3.9–10.3)
lymph#: 1.6 10*3/uL (ref 0.9–3.3)

## 2008-07-09 LAB — CBC WITH DIFFERENTIAL/PLATELET
BASO%: 1 % (ref 0.0–2.0)
EOS%: 1.5 % (ref 0.0–7.0)
HCT: 39.8 % (ref 34.8–46.6)
MCH: 25 pg — ABNORMAL LOW (ref 25.1–34.0)
MCHC: 32.2 g/dL (ref 31.5–36.0)
NEUT%: 54.9 % (ref 38.4–76.8)
lymph#: 1.8 10*3/uL (ref 0.9–3.3)

## 2008-07-28 ENCOUNTER — Ambulatory Visit: Payer: Self-pay | Admitting: Oncology

## 2008-07-30 LAB — CBC WITH DIFFERENTIAL/PLATELET
BASO%: 0.5 % (ref 0.0–2.0)
Basophils Absolute: 0 10*3/uL (ref 0.0–0.1)
EOS%: 0.7 % (ref 0.0–7.0)
HGB: 13.1 g/dL (ref 11.6–15.9)
MCH: 26.3 pg (ref 25.1–34.0)
MCHC: 33.4 g/dL (ref 31.5–36.0)
RDW: 16.6 % — ABNORMAL HIGH (ref 11.2–14.5)
lymph#: 1.4 10*3/uL (ref 0.9–3.3)

## 2008-08-21 LAB — CBC WITH DIFFERENTIAL/PLATELET
BASO%: 0.6 % (ref 0.0–2.0)
Eosinophils Absolute: 0.1 10*3/uL (ref 0.0–0.5)
HCT: 39.6 % (ref 34.8–46.6)
HGB: 13.1 g/dL (ref 11.6–15.9)
MCHC: 33.1 g/dL (ref 31.5–36.0)
MONO#: 0.6 10*3/uL (ref 0.1–0.9)
NEUT#: 4.5 10*3/uL (ref 1.5–6.5)
NEUT%: 65.7 % (ref 38.4–76.8)
Platelets: 151 10*3/uL (ref 145–400)
WBC: 6.8 10*3/uL (ref 3.9–10.3)
lymph#: 1.6 10*3/uL (ref 0.9–3.3)

## 2008-08-21 LAB — COMPREHENSIVE METABOLIC PANEL
ALT: 10 U/L (ref 0–35)
CO2: 25 mEq/L (ref 19–32)
Calcium: 9.2 mg/dL (ref 8.4–10.5)
Chloride: 103 mEq/L (ref 96–112)
Creatinine, Ser: 0.89 mg/dL (ref 0.40–1.20)
Glucose, Bld: 152 mg/dL — ABNORMAL HIGH (ref 70–99)
Total Protein: 6.4 g/dL (ref 6.0–8.3)

## 2008-08-21 LAB — LACTATE DEHYDROGENASE: LDH: 165 U/L (ref 94–250)

## 2008-09-15 ENCOUNTER — Ambulatory Visit: Payer: Self-pay | Admitting: Oncology

## 2008-09-17 LAB — CBC WITH DIFFERENTIAL/PLATELET
BASO%: 0.9 % (ref 0.0–2.0)
Eosinophils Absolute: 0.1 10*3/uL (ref 0.0–0.5)
LYMPH%: 30.4 % (ref 14.0–49.7)
MCHC: 33.3 g/dL (ref 31.5–36.0)
MONO#: 0.5 10*3/uL (ref 0.1–0.9)
NEUT#: 3.4 10*3/uL (ref 1.5–6.5)
RBC: 5.22 10*6/uL (ref 3.70–5.45)
RDW: 16.5 % — ABNORMAL HIGH (ref 11.2–14.5)
WBC: 5.8 10*3/uL (ref 3.9–10.3)
lymph#: 1.8 10*3/uL (ref 0.9–3.3)

## 2008-10-15 ENCOUNTER — Ambulatory Visit: Payer: Self-pay | Admitting: Oncology

## 2008-10-15 LAB — CBC WITH DIFFERENTIAL/PLATELET
BASO%: 0.4 % (ref 0.0–2.0)
EOS%: 1.4 % (ref 0.0–7.0)
Eosinophils Absolute: 0.1 10*3/uL (ref 0.0–0.5)
LYMPH%: 27.8 % (ref 14.0–49.7)
MCHC: 33.4 g/dL (ref 31.5–36.0)
MCV: 81.4 fL (ref 79.5–101.0)
MONO%: 8.5 % (ref 0.0–14.0)
NEUT#: 3.4 10*3/uL (ref 1.5–6.5)
Platelets: 116 10*3/uL — ABNORMAL LOW (ref 145–400)
RBC: 5 10*6/uL (ref 3.70–5.45)
RDW: 15.7 % — ABNORMAL HIGH (ref 11.2–14.5)

## 2008-11-10 ENCOUNTER — Ambulatory Visit: Payer: Self-pay | Admitting: Oncology

## 2008-11-12 LAB — CBC WITH DIFFERENTIAL/PLATELET
BASO%: 1.3 % (ref 0.0–2.0)
Basophils Absolute: 0.1 10*3/uL (ref 0.0–0.1)
EOS%: 1.1 % (ref 0.0–7.0)
HCT: 43.8 % (ref 34.8–46.6)
HGB: 14.5 g/dL (ref 11.6–15.9)
MCH: 27.7 pg (ref 25.1–34.0)
MCHC: 33.1 g/dL (ref 31.5–36.0)
MCV: 83.7 fL (ref 79.5–101.0)
MONO%: 9.7 % (ref 0.0–14.0)
NEUT%: 55.6 % (ref 38.4–76.8)
RDW: 15.3 % — ABNORMAL HIGH (ref 11.2–14.5)
lymph#: 2 10*3/uL (ref 0.9–3.3)

## 2008-12-09 LAB — CBC WITH DIFFERENTIAL/PLATELET
BASO%: 0.5 % (ref 0.0–2.0)
Basophils Absolute: 0 10*3/uL (ref 0.0–0.1)
EOS%: 1.3 % (ref 0.0–7.0)
HGB: 14.6 g/dL (ref 11.6–15.9)
MCH: 28 pg (ref 25.1–34.0)
MONO#: 0.6 10*3/uL (ref 0.1–0.9)
RDW: 14.5 % (ref 11.2–14.5)
WBC: 6.2 10*3/uL (ref 3.9–10.3)
lymph#: 2 10*3/uL (ref 0.9–3.3)

## 2009-01-04 ENCOUNTER — Ambulatory Visit: Payer: Self-pay | Admitting: Oncology

## 2009-01-06 LAB — CBC WITH DIFFERENTIAL/PLATELET
BASO%: 0.7 % (ref 0.0–2.0)
Basophils Absolute: 0 10*3/uL (ref 0.0–0.1)
Eosinophils Absolute: 0.1 10*3/uL (ref 0.0–0.5)
HCT: 43 % (ref 34.8–46.6)
HGB: 14.5 g/dL (ref 11.6–15.9)
LYMPH%: 32.5 % (ref 14.0–49.7)
MONO#: 0.5 10*3/uL (ref 0.1–0.9)
NEUT%: 55.1 % (ref 38.4–76.8)
Platelets: 122 10*3/uL — ABNORMAL LOW (ref 145–400)
WBC: 5.3 10*3/uL (ref 3.9–10.3)
lymph#: 1.7 10*3/uL (ref 0.9–3.3)

## 2009-01-06 LAB — COMPREHENSIVE METABOLIC PANEL
ALT: 14 U/L (ref 0–35)
BUN: 12 mg/dL (ref 6–23)
CO2: 27 mEq/L (ref 19–32)
Calcium: 8.9 mg/dL (ref 8.4–10.5)
Chloride: 104 mEq/L (ref 96–112)
Creatinine, Ser: 0.69 mg/dL (ref 0.40–1.20)
Glucose, Bld: 128 mg/dL — ABNORMAL HIGH (ref 70–99)
Total Bilirubin: 0.4 mg/dL (ref 0.3–1.2)

## 2009-01-06 LAB — LACTATE DEHYDROGENASE: LDH: 181 U/L (ref 94–250)

## 2009-02-03 ENCOUNTER — Inpatient Hospital Stay (HOSPITAL_COMMUNITY): Admission: EM | Admit: 2009-02-03 | Discharge: 2009-02-07 | Payer: Self-pay | Admitting: Emergency Medicine

## 2009-02-03 DIAGNOSIS — I639 Cerebral infarction, unspecified: Secondary | ICD-10-CM

## 2009-02-03 HISTORY — DX: Cerebral infarction, unspecified: I63.9

## 2009-02-06 ENCOUNTER — Encounter (INDEPENDENT_AMBULATORY_CARE_PROVIDER_SITE_OTHER): Payer: Self-pay | Admitting: Internal Medicine

## 2009-02-12 ENCOUNTER — Encounter: Admission: RE | Admit: 2009-02-12 | Discharge: 2009-05-05 | Payer: Self-pay | Admitting: Neurology

## 2009-03-09 ENCOUNTER — Ambulatory Visit: Payer: Self-pay | Admitting: Oncology

## 2009-03-18 LAB — CBC WITH DIFFERENTIAL/PLATELET
BASO%: 0.7 % (ref 0.0–2.0)
HCT: 41.1 % (ref 34.8–46.6)
MCHC: 32.6 g/dL (ref 31.5–36.0)
MONO#: 0.4 10*3/uL (ref 0.1–0.9)
NEUT%: 62.8 % (ref 38.4–76.8)
RDW: 13.5 % (ref 11.2–14.5)
WBC: 6.8 10*3/uL (ref 3.9–10.3)
lymph#: 2 10*3/uL (ref 0.9–3.3)

## 2009-05-05 ENCOUNTER — Ambulatory Visit: Payer: Self-pay | Admitting: Oncology

## 2009-05-07 LAB — CBC WITH DIFFERENTIAL/PLATELET
Eosinophils Absolute: 0.1 10*3/uL (ref 0.0–0.5)
MONO#: 0.6 10*3/uL (ref 0.1–0.9)
MONO%: 8.3 % (ref 0.0–14.0)
NEUT#: 4 10*3/uL (ref 1.5–6.5)
RBC: 4.98 10*6/uL (ref 3.70–5.45)
RDW: 13.9 % (ref 11.2–14.5)
WBC: 6.9 10*3/uL (ref 3.9–10.3)
nRBC: 0 % (ref 0–0)

## 2009-08-02 ENCOUNTER — Ambulatory Visit: Payer: Self-pay | Admitting: Oncology

## 2009-08-03 LAB — CBC WITH DIFFERENTIAL/PLATELET
EOS%: 2.3 % (ref 0.0–7.0)
MCH: 29.2 pg (ref 25.1–34.0)
MCV: 86 fL (ref 79.5–101.0)
MONO%: 9.6 % (ref 0.0–14.0)
RBC: 4.48 10*6/uL (ref 3.70–5.45)
RDW: 14.9 % — ABNORMAL HIGH (ref 11.2–14.5)

## 2009-10-01 ENCOUNTER — Ambulatory Visit: Payer: Self-pay | Admitting: Oncology

## 2009-10-05 LAB — CBC WITH DIFFERENTIAL/PLATELET
BASO%: 0.9 % (ref 0.0–2.0)
EOS%: 2.6 % (ref 0.0–7.0)
HCT: 43.4 % (ref 34.8–46.6)
LYMPH%: 31.7 % (ref 14.0–49.7)
MCH: 29.1 pg (ref 25.1–34.0)
MCHC: 33.4 g/dL (ref 31.5–36.0)
NEUT%: 56.1 % (ref 38.4–76.8)
Platelets: 141 10*3/uL — ABNORMAL LOW (ref 145–400)
lymph#: 2.2 10*3/uL (ref 0.9–3.3)

## 2009-11-26 ENCOUNTER — Ambulatory Visit: Payer: Self-pay | Admitting: Oncology

## 2010-01-24 ENCOUNTER — Ambulatory Visit: Payer: Self-pay | Admitting: Oncology

## 2010-01-25 LAB — COMPREHENSIVE METABOLIC PANEL
Albumin: 3.6 g/dL (ref 3.5–5.2)
BUN: 13 mg/dL (ref 6–23)
CO2: 29 mEq/L (ref 19–32)
Calcium: 9 mg/dL (ref 8.4–10.5)
Chloride: 106 mEq/L (ref 96–112)
Creatinine, Ser: 0.85 mg/dL (ref 0.40–1.20)
Glucose, Bld: 122 mg/dL — ABNORMAL HIGH (ref 70–99)

## 2010-01-25 LAB — CBC WITH DIFFERENTIAL/PLATELET
Basophils Absolute: 0.1 10*3/uL (ref 0.0–0.1)
Eosinophils Absolute: 0.1 10*3/uL (ref 0.0–0.5)
HCT: 41.9 % (ref 34.8–46.6)
HGB: 14.2 g/dL (ref 11.6–15.9)
MCH: 29.6 pg (ref 25.1–34.0)
MONO#: 0.6 10*3/uL (ref 0.1–0.9)
NEUT#: 3.7 10*3/uL (ref 1.5–6.5)
NEUT%: 57.8 % (ref 38.4–76.8)
WBC: 6.3 10*3/uL (ref 3.9–10.3)
lymph#: 1.9 10*3/uL (ref 0.9–3.3)

## 2010-01-25 LAB — LACTATE DEHYDROGENASE: LDH: 158 U/L (ref 94–250)

## 2010-01-27 ENCOUNTER — Ambulatory Visit: Payer: Self-pay | Admitting: Emergency Medicine

## 2010-03-10 NOTE — Assessment & Plan Note (Signed)
Summary: FLU SHOT  Nurse Visit   Vital Signs:  Patient profile:   77 year old female Temp:     98.1 degrees F oral  Vitals Entered By: Lajean Saver RN (January 27, 2010 10:14 AM)  Allergies (verified): No Known Drug Allergies  Immunizations Administered:  Influenza Vaccine:    Vaccine Type: fluzone    Site: left deltoid    Mfr: Sanofi Pasteur    Dose: 0.5 ml    Route: IM    Given by: Lajean Saver RN    Exp. Date: 08/06/2010    Lot #: [id number]    VIS given: 08/31/09 version given January 27, 2010.   Immunizations Administered:  Influenza Vaccine:    Vaccine Type: fluzone    Site: left deltoid    Mfr: Sanofi Pasteur    Dose: 0.5 ml    Route: IM    Given by: Lajean Saver RN    Exp. Date: 08/06/2010    Lot #: [id number]    VIS given: 08/31/09 version given January 27, 2010.  Flu Vaccine Consent Questions:    Do you have a history of severe allergic reactions to this vaccine? no    Any prior history of allergic reactions to egg and/or gelatin? no    Do you have a sensitivity to the preservative Thimersol? no    Do you have a past history of Guillan-Barre Syndrome? no    Do you currently have an acute febrile illness? no    Have you ever had a severe reaction to latex? no    Vaccine information given and explained to patient? yes    Are you currently pregnant? no

## 2010-04-24 LAB — GLUCOSE, CAPILLARY
Glucose-Capillary: 121 mg/dL — ABNORMAL HIGH (ref 70–99)
Glucose-Capillary: 128 mg/dL — ABNORMAL HIGH (ref 70–99)
Glucose-Capillary: 144 mg/dL — ABNORMAL HIGH (ref 70–99)
Glucose-Capillary: 156 mg/dL — ABNORMAL HIGH (ref 70–99)

## 2010-04-26 ENCOUNTER — Encounter (HOSPITAL_BASED_OUTPATIENT_CLINIC_OR_DEPARTMENT_OTHER): Payer: Medicare Other | Admitting: Oncology

## 2010-04-26 ENCOUNTER — Other Ambulatory Visit (HOSPITAL_COMMUNITY): Payer: Self-pay | Admitting: Oncology

## 2010-04-26 DIAGNOSIS — D693 Immune thrombocytopenic purpura: Secondary | ICD-10-CM

## 2010-04-26 LAB — CBC WITH DIFFERENTIAL/PLATELET
BASO%: 0.3 % (ref 0.0–2.0)
Basophils Absolute: 0 10*3/uL (ref 0.0–0.1)
EOS%: 1.4 % (ref 0.0–7.0)
HCT: 41.7 % (ref 34.8–46.6)
HGB: 14.2 g/dL (ref 11.6–15.9)
MCHC: 34.1 g/dL (ref 31.5–36.0)
MONO#: 0.6 10*3/uL (ref 0.1–0.9)
NEUT%: 60.2 % (ref 38.4–76.8)
RDW: 14.1 % (ref 11.2–14.5)
WBC: 7 10*3/uL (ref 3.9–10.3)
lymph#: 2.1 10*3/uL (ref 0.9–3.3)

## 2010-05-09 LAB — CARDIAC PANEL(CRET KIN+CKTOT+MB+TROPI)
CK, MB: 2.1 ng/mL (ref 0.3–4.0)
CK, MB: 2.8 ng/mL (ref 0.3–4.0)
Relative Index: INVALID (ref 0.0–2.5)
Total CK: 43 U/L (ref 7–177)
Total CK: 55 U/L (ref 7–177)
Troponin I: 0.01 ng/mL (ref 0.00–0.06)
Troponin I: 0.02 ng/mL (ref 0.00–0.06)

## 2010-05-09 LAB — COMPREHENSIVE METABOLIC PANEL
ALT: 17 U/L (ref 0–35)
AST: 25 U/L (ref 0–37)
Albumin: 3.3 g/dL — ABNORMAL LOW (ref 3.5–5.2)
Albumin: 3.5 g/dL (ref 3.5–5.2)
Alkaline Phosphatase: 72 U/L (ref 39–117)
Alkaline Phosphatase: 78 U/L (ref 39–117)
BUN: 11 mg/dL (ref 6–23)
CO2: 26 mEq/L (ref 19–32)
Chloride: 103 mEq/L (ref 96–112)
Chloride: 103 mEq/L (ref 96–112)
Creatinine, Ser: 0.64 mg/dL (ref 0.4–1.2)
Creatinine, Ser: 0.71 mg/dL (ref 0.4–1.2)
GFR calc non Af Amer: >60 mL/min (ref >60)
Glucose, Bld: 122 mg/dL — ABNORMAL HIGH (ref 70–99)
Potassium: 3.9 mEq/L (ref 3.5–5.1)
Potassium: 4.2 mEq/L (ref 3.5–5.1)
Sodium: 139 mEq/L (ref 135–145)
Total Bilirubin: 0.4 mg/dL (ref 0.3–1.2)
Total Bilirubin: 0.5 mg/dL (ref 0.3–1.2)
Total Protein: 6.2 g/dL (ref 6.0–8.3)

## 2010-05-09 LAB — SEDIMENTATION RATE: Sed Rate: 11 mm/hr (ref 0–22)

## 2010-05-09 LAB — DIFFERENTIAL
Basophils Absolute: 0 10*3/uL (ref 0.0–0.1)
Basophils Relative: 0 % (ref 0–1)
Eosinophils Absolute: 0 10*3/uL (ref 0.0–0.7)
Eosinophils Relative: 0 % (ref 0–5)
Monocytes Absolute: 0.3 10*3/uL (ref 0.1–1.0)
Monocytes Relative: 4 % (ref 3–12)
Neutro Abs: 6.1 10*3/uL (ref 1.7–7.7)

## 2010-05-09 LAB — GLUCOSE, CAPILLARY
Glucose-Capillary: 123 mg/dL — ABNORMAL HIGH (ref 70–99)
Glucose-Capillary: 134 mg/dL — ABNORMAL HIGH (ref 70–99)
Glucose-Capillary: 186 mg/dL — ABNORMAL HIGH (ref 70–99)

## 2010-05-09 LAB — CBC
HCT: 41.4 % (ref 36.0–46.0)
HCT: 42.7 % (ref 36.0–46.0)
Hemoglobin: 13.8 g/dL (ref 12.0–15.0)
Hemoglobin: 14.2 g/dL (ref 12.0–15.0)
MCV: 86.3 fL (ref 78.0–100.0)
Platelets: 136 10*3/uL — ABNORMAL LOW (ref 150–400)
RBC: 4.98 MIL/uL (ref 3.87–5.11)
RDW: 13.8 % (ref 11.5–15.5)
RDW: 14 % (ref 11.5–15.5)

## 2010-05-09 LAB — LIPID PANEL
LDL Cholesterol: 136 mg/dL — ABNORMAL HIGH (ref 0–99)
Total CHOL/HDL Ratio: 4.8 RATIO
Triglycerides: 141 mg/dL (ref <150)
VLDL: 28 mg/dL (ref 0–40)

## 2010-05-09 LAB — APTT: aPTT: 32 seconds (ref 24–37)

## 2010-05-09 LAB — HEMOGLOBIN A1C: Hgb A1c MFr Bld: 7.2 % — ABNORMAL HIGH (ref 4.6–6.1)

## 2010-05-09 LAB — CK TOTAL AND CKMB (NOT AT ARMC): CK, MB: 3 ng/mL (ref 0.3–4.0)

## 2010-05-09 LAB — ACETYLCHOLINE RECEPTOR, BINDING: Acetylcholine Receptor Ab: <0.3 nmol/L (ref <=0.30)

## 2010-05-09 LAB — BASIC METABOLIC PANEL
BUN: 14 mg/dL (ref 6–23)
CO2: 27 mEq/L (ref 19–32)
Chloride: 105 mEq/L (ref 96–112)
Creatinine, Ser: 0.68 mg/dL (ref 0.4–1.2)
Potassium: 4.3 mEq/L (ref 3.5–5.1)

## 2010-05-09 LAB — C-REACTIVE PROTEIN: CRP: 0.8 mg/dL — ABNORMAL HIGH (ref <0.6)

## 2010-05-09 LAB — SJOGRENS SYNDROME-B EXTRACTABLE NUCLEAR ANTIBODY: SSB (La) (ENA) Antibody, IgG: <0.2 AI (ref <1.0)

## 2010-06-21 NOTE — Op Note (Signed)
NAMETERRENCE, PIZANA                 ACCOUNT NO.:  [id number]   MEDICAL RECORD NO.:  [id number]          PATIENT TYPE:  AMB   LOCATION:  DAY                          FACILITY[facility]  [facility]   PHYSICIAN:  Velora Heckler, MD      DATE OF BIRTH:  01-14-1929   DATE OF PROCEDURE:  07/05/2006  DATE OF DISCHARGE:                               OPERATIVE REPORT   PREOPERATIVE DIAGNOSIS:  Primary hyperparathyroidism.   POSTOPERATIVE DIAGNOSIS:  Primary hyperparathyroidism.   PROCEDURE:  Left superior minimally invasive parathyroidectomy.   SURGEON:  Velora Heckler, M.D., FACS   ASSISTANT:  Sheppard Plumber. Earlene Plater, M.D., FACS   ANESTHESIA:  General per Dr. Lucille Passy.   ESTIMATED BLOOD LOSS:  Minimal.   PREPARATION:  Betadine.   COMPLICATIONS:  None.   INDICATIONS:  Chelsea [name] is a pleasant, 77 year old, white female, from  [place], [place].  She recently relocated to [place],  [place].  She has had longstanding osteoporosis.  Primary care  physician noted elevated serum calcium levels ranging from 10.6 to 11.2.  An intact PTH level was elevated at 108.  A Sestamibi scan performed at  [facility] in March 2008 demonstrated uptake in the left  superior position suspicious for parathyroid adenoma.  The patient now  comes to surgery for parathyroidectomy.   The procedure is done in OR #11 at [facility].  The patient is brought to the operating room and placed in a supine  position on the operating room table.  Following the administration of  general endotracheal anesthesia, the patient is positioned and then  prepped and draped in the usual strict aseptic fashion.  After  ascertaining that an adequate level of anesthesia had been obtained, a  left neck incision was made with a #15 blade.  Dissection was carried  through subcutaneous tissues and platysma.  Hemostasis is obtained via  electrocautery.  Skin flaps are elevated cephalad and  caudad and a  Weitlaner retractor was placed for exposure.  Strap muscles are incised  in the midline and reflected laterally.  The left thyroid lobe is  exposed.  Venous tributaries to the left lobe are divided between small  Ligaclips and with the harmonic scalpel.  The gland is rolled  anteriorly.  Exploration in the tracheal esophageal groove demonstrates  an enlarged, left superior parathyroid gland.  It is gently dissected  out with blunt dissection.  The recurrent nerve was identified and  preserved.  Vascular structures to the parathyroid gland are divided  between small Ligaclips.  The gland is excised in its entirety and  submitted to pathology for review.  Frozen section by Dr. Guerry Bruin  confirms parathyroid tissue consistent with parathyroid adenoma.  Exploration inferiorly shows no evidence of enlarged parathyroid tissue.  Good hemostasis is noted.  Surgicel is placed in the operative field.  Strap muscles are reapproximated in the midline with interrupted 3-0  Vicryl sutures.  The platysma is closed with interrupted 3-0 Vicryl  sutures.  Local Marcaine anesthetic is injected  circumferentially.  The skin is closed with a  running 4-0 Monocryl  subcuticular suture.  The wound is washed and dried and Benzoin and  Steri-Strips are applied.  Sterile dressings are applied.  The patient  is awakened from anesthesia and brought to the recovery room in stable  condition.  The patient tolerated the procedure well.      Velora Heckler, MD  Electronically Signed     TMG/MEDQ  D:  07/05/2006  T:  07/05/2006  Job:  [id number]   cc:   Juline Patch, M.D.  Fax: [phone]   Velora Heckler, MD  [phone] [address] [place]  [place] [address]

## 2010-07-28 ENCOUNTER — Other Ambulatory Visit (HOSPITAL_COMMUNITY): Payer: Self-pay | Admitting: Oncology

## 2010-07-28 ENCOUNTER — Encounter (HOSPITAL_BASED_OUTPATIENT_CLINIC_OR_DEPARTMENT_OTHER): Payer: Medicare Other | Admitting: Oncology

## 2010-07-28 DIAGNOSIS — D693 Immune thrombocytopenic purpura: Secondary | ICD-10-CM

## 2010-07-28 LAB — COMPREHENSIVE METABOLIC PANEL
ALT: 14 U/L (ref 0–35)
AST: 19 U/L (ref 0–37)
Calcium: 9.8 mg/dL (ref 8.4–10.5)
Chloride: 102 mEq/L (ref 96–112)
Creatinine, Ser: 0.76 mg/dL (ref 0.50–1.10)
Sodium: 139 mEq/L (ref 135–145)
Total Bilirubin: 0.4 mg/dL (ref 0.3–1.2)
Total Protein: 6.8 g/dL (ref 6.0–8.3)

## 2010-07-28 LAB — LACTATE DEHYDROGENASE: LDH: 193 U/L (ref 94–250)

## 2010-07-28 LAB — CBC WITH DIFFERENTIAL/PLATELET
BASO%: 0.7 % (ref 0.0–2.0)
EOS%: 1.5 % (ref 0.0–7.0)
HCT: 39.7 % (ref 34.8–46.6)
MCH: 30.5 pg (ref 25.1–34.0)
MCHC: 34.3 g/dL (ref 31.5–36.0)
NEUT%: 58.4 % (ref 38.4–76.8)
RBC: 4.47 10*6/uL (ref 3.70–5.45)
WBC: 6.5 10*3/uL (ref 3.9–10.3)
lymph#: 2 10*3/uL (ref 0.9–3.3)

## 2010-09-07 ENCOUNTER — Other Ambulatory Visit: Payer: Self-pay | Admitting: Dermatology

## 2010-11-01 LAB — BONE MARROW EXAM

## 2010-12-19 ENCOUNTER — Other Ambulatory Visit (HOSPITAL_BASED_OUTPATIENT_CLINIC_OR_DEPARTMENT_OTHER): Payer: Medicare Other

## 2010-12-19 ENCOUNTER — Other Ambulatory Visit: Payer: Self-pay | Admitting: Oncology

## 2010-12-19 ENCOUNTER — Other Ambulatory Visit (HOSPITAL_COMMUNITY): Payer: Self-pay | Admitting: Oncology

## 2010-12-19 DIAGNOSIS — D693 Immune thrombocytopenic purpura: Secondary | ICD-10-CM

## 2010-12-19 LAB — CBC WITH DIFFERENTIAL/PLATELET
Basophils Absolute: 0.1 10*3/uL (ref 0.0–0.1)
EOS%: 0.9 % (ref 0.0–7.0)
HCT: 39 % (ref 34.8–46.6)
HGB: 13 g/dL (ref 11.6–15.9)
MCH: 29.5 pg (ref 25.1–34.0)
MCV: 88.5 fL (ref 79.5–101.0)
MONO%: 9.1 % (ref 0.0–14.0)
NEUT%: 63.9 % (ref 38.4–76.8)
RDW: 14.3 % (ref 11.2–14.5)

## 2011-02-27 DIAGNOSIS — H40039 Anatomical narrow angle, unspecified eye: Secondary | ICD-10-CM | POA: Diagnosis not present

## 2011-02-27 DIAGNOSIS — H402290 Chronic angle-closure glaucoma, unspecified eye, stage unspecified: Secondary | ICD-10-CM | POA: Diagnosis not present

## 2011-02-27 DIAGNOSIS — H1045 Other chronic allergic conjunctivitis: Secondary | ICD-10-CM | POA: Diagnosis not present

## 2011-03-27 DIAGNOSIS — Z803 Family history of malignant neoplasm of breast: Secondary | ICD-10-CM | POA: Diagnosis not present

## 2011-03-27 DIAGNOSIS — Z1231 Encounter for screening mammogram for malignant neoplasm of breast: Secondary | ICD-10-CM | POA: Diagnosis not present

## 2011-04-04 DIAGNOSIS — N63 Unspecified lump in unspecified breast: Secondary | ICD-10-CM | POA: Diagnosis not present

## 2011-04-04 DIAGNOSIS — R928 Other abnormal and inconclusive findings on diagnostic imaging of breast: Secondary | ICD-10-CM | POA: Diagnosis not present

## 2011-04-14 ENCOUNTER — Encounter: Payer: Self-pay | Admitting: Oncology

## 2011-04-14 ENCOUNTER — Ambulatory Visit (HOSPITAL_BASED_OUTPATIENT_CLINIC_OR_DEPARTMENT_OTHER): Payer: Medicare Other | Admitting: Oncology

## 2011-04-14 ENCOUNTER — Other Ambulatory Visit (HOSPITAL_BASED_OUTPATIENT_CLINIC_OR_DEPARTMENT_OTHER): Payer: Medicare Other | Admitting: Lab

## 2011-04-14 ENCOUNTER — Telehealth: Payer: Self-pay | Admitting: Oncology

## 2011-04-14 VITALS — BP 142/85 | HR 98 | Temp 97.2°F | Ht 63.5 in | Wt 207.8 lb

## 2011-04-14 DIAGNOSIS — Z862 Personal history of diseases of the blood and blood-forming organs and certain disorders involving the immune mechanism: Secondary | ICD-10-CM | POA: Insufficient documentation

## 2011-04-14 DIAGNOSIS — D693 Immune thrombocytopenic purpura: Secondary | ICD-10-CM | POA: Diagnosis not present

## 2011-04-14 LAB — CBC WITH DIFFERENTIAL/PLATELET
EOS%: 1.7 % (ref 0.0–7.0)
MCH: 28.9 pg (ref 25.1–34.0)
MCHC: 33.4 g/dL (ref 31.5–36.0)
MCV: 86.7 fL (ref 79.5–101.0)
MONO%: 10.2 % (ref 0.0–14.0)
RBC: 4.77 10*6/uL (ref 3.70–5.45)
RDW: 16 % — ABNORMAL HIGH (ref 11.2–14.5)

## 2011-04-14 LAB — COMPREHENSIVE METABOLIC PANEL
AST: 17 U/L (ref 0–37)
Albumin: 4 g/dL (ref 3.5–5.2)
Alkaline Phosphatase: 74 U/L (ref 39–117)
Potassium: 4.1 mEq/L (ref 3.5–5.3)
Sodium: 140 mEq/L (ref 135–145)
Total Protein: 6.4 g/dL (ref 6.0–8.3)

## 2011-04-14 NOTE — Progress Notes (Signed)
This office note has been dictated.  #[id number]

## 2011-04-14 NOTE — Telephone Encounter (Signed)
pt stated that she will call to scheduled 1 year appt

## 2011-04-14 NOTE — Progress Notes (Signed)
CC:   Juline Patch, M.D. Levert Feinstein, MD  PROBLEM LIST: 1. ITP diagnosed in February 2009.  Bone marrow was carried out on     06/20/2007 showing 40% to 50% cellularity and increased     megakaryocytes.  ANA was positive at 1:160.  The patient was     treated with prednisone initially and then Rituxan.  Rituxan was     given in April and May of 2009.  Prednisone was weaned and     ultimately discontinued in April 2010.  The patient has remained in     remission since that time off of all treatment. 2. Hypertension. 3. Diabetes mellitus. 4. Dyslipidemia. 5. Osteoporosis. 6. History of primary hyperparathyroidism due to a parathyroid adenoma     with surgical resection on 07/05/2006. 7. 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  The patient     remains with some visual impairment.  MEDICATIONS: 1. Aspirin 81 mg daily. 2. Lipitor 5 mg daily. 3. Vitamin D3 two thousand units daily. 4. Fish oil omega-3 fatty acids 1000 mg daily. 5. Ibuprofen 200 mg every 6 hours as needed. 6. Metformin 500 mg twice daily. 7. Diovan HCT 320/25 mg 1 tablet daily.  HISTORY:  Chelsea [name] is now 78 years old and is followed by [place] for a history of ITP going back to February 2009. I.e., 4 years ago.  The patient is off treatment.  She is here today with her daughter, with whom she lives.  The patient was last seen by [place] on 07/28/2010.  There have been no problems related to her ITP.  She has had a normal platelet count going back now about 3 years.  The patient's main problem is visual impairment as a residual of her stroke.  She is not able to drive.  Her health has been generally excellent without any health problems since her last visit here on July 28, 2010.  She is without complaints today.  No bleeding or bruising problems.  She does have decreased energy and decreased stamina over the past several years.  PHYSICAL EXAMINATION:  Weight is 207.8 pounds, height 5 feet 3-1/2 inches, body  surface area 2.05 sq. m.  Blood pressure 142/85.  Other vital signs are normal.  There is no scleral icterus.  Mouth and pharynx are benign.  No peripheral adenopathy palpable.  Heart and lungs: Normal.  Abdomen with the patient sitting is obese, nontender, with no organomegaly or masses palpable.  Extremities:  No peripheral edema. Neurologic:  Grossly normal.  Breasts:  Not examined.  The patient does have yearly mammograms at [facility].  She had these most recently on March 27, 2011.  LABORATORY DATA:  Today, white count 7.0, ANC 4.5, hemoglobin 13.8, hematocrit 41.3, platelets 209,000.  Chemistries today are pending. Chemistries from 07/28/2010 were normal except for a glucose of 108.  IMAGING STUDIES: 1. MRI of the head without IV contrast on 02/03/2009 showed advanced     atrophy and chronic microvascular ischemic change with no acute     brainstem stroke observed.  There was no parasellar, cavernous     sinus or orbital apex mass to explain the patient's 3rd nerve     palsy. 2. CT angiogram of head and neck carried out on 02/03/2009 showed no     significant extracranial atherosclerotic change.  There was also an     unremarkable CT angiography of the intracranial circulation.  IMPRESSION AND PLAN:  Ms. Wolfley continues to do well, now 4  years from the time of diagnosis.  Ms. Pinder will have a CBC done by Dr. Ricki Miller in approximately 6 months.  We will plan to see her again in 1 year, at which time we will check CBC and chemistries.    ______________________________ Samul Dada, M.D. DSM/MEDQ  D:  04/14/2011  T:  04/14/2011  Job:  [id number]

## 2011-06-19 DIAGNOSIS — E559 Vitamin D deficiency, unspecified: Secondary | ICD-10-CM | POA: Diagnosis not present

## 2011-06-19 DIAGNOSIS — R21 Rash and other nonspecific skin eruption: Secondary | ICD-10-CM | POA: Diagnosis not present

## 2011-06-19 DIAGNOSIS — E119 Type 2 diabetes mellitus without complications: Secondary | ICD-10-CM | POA: Diagnosis not present

## 2011-06-19 DIAGNOSIS — I10 Essential (primary) hypertension: Secondary | ICD-10-CM | POA: Diagnosis not present

## 2011-06-22 DIAGNOSIS — M949 Disorder of cartilage, unspecified: Secondary | ICD-10-CM | POA: Diagnosis not present

## 2011-06-22 DIAGNOSIS — E119 Type 2 diabetes mellitus without complications: Secondary | ICD-10-CM | POA: Diagnosis not present

## 2011-06-22 DIAGNOSIS — I10 Essential (primary) hypertension: Secondary | ICD-10-CM | POA: Diagnosis not present

## 2011-06-22 DIAGNOSIS — M899 Disorder of bone, unspecified: Secondary | ICD-10-CM | POA: Diagnosis not present

## 2011-06-22 DIAGNOSIS — E559 Vitamin D deficiency, unspecified: Secondary | ICD-10-CM | POA: Diagnosis not present

## 2011-10-06 DIAGNOSIS — B351 Tinea unguium: Secondary | ICD-10-CM | POA: Diagnosis not present

## 2011-10-06 DIAGNOSIS — M79609 Pain in unspecified limb: Secondary | ICD-10-CM | POA: Diagnosis not present

## 2011-12-13 DIAGNOSIS — E119 Type 2 diabetes mellitus without complications: Secondary | ICD-10-CM | POA: Diagnosis not present

## 2011-12-18 DIAGNOSIS — I10 Essential (primary) hypertension: Secondary | ICD-10-CM | POA: Diagnosis not present

## 2011-12-18 DIAGNOSIS — Z23 Encounter for immunization: Secondary | ICD-10-CM | POA: Diagnosis not present

## 2011-12-18 DIAGNOSIS — E119 Type 2 diabetes mellitus without complications: Secondary | ICD-10-CM | POA: Diagnosis not present

## 2011-12-18 DIAGNOSIS — E7800 Pure hypercholesterolemia, unspecified: Secondary | ICD-10-CM | POA: Diagnosis not present

## 2011-12-18 DIAGNOSIS — Z1331 Encounter for screening for depression: Secondary | ICD-10-CM | POA: Diagnosis not present

## 2011-12-20 DIAGNOSIS — H409 Unspecified glaucoma: Secondary | ICD-10-CM | POA: Diagnosis not present

## 2011-12-20 DIAGNOSIS — H35369 Drusen (degenerative) of macula, unspecified eye: Secondary | ICD-10-CM | POA: Diagnosis not present

## 2011-12-20 DIAGNOSIS — H402290 Chronic angle-closure glaucoma, unspecified eye, stage unspecified: Secondary | ICD-10-CM | POA: Diagnosis not present

## 2011-12-20 DIAGNOSIS — H1045 Other chronic allergic conjunctivitis: Secondary | ICD-10-CM | POA: Diagnosis not present

## 2011-12-20 DIAGNOSIS — H2510 Age-related nuclear cataract, unspecified eye: Secondary | ICD-10-CM | POA: Diagnosis not present

## 2012-03-25 DIAGNOSIS — E119 Type 2 diabetes mellitus without complications: Secondary | ICD-10-CM | POA: Diagnosis not present

## 2012-03-28 DIAGNOSIS — I10 Essential (primary) hypertension: Secondary | ICD-10-CM | POA: Diagnosis not present

## 2012-03-28 DIAGNOSIS — Z0000 Encounter for general adult medical examination without abnormal findings: Secondary | ICD-10-CM | POA: Diagnosis not present

## 2012-03-28 DIAGNOSIS — H1045 Other chronic allergic conjunctivitis: Secondary | ICD-10-CM | POA: Diagnosis not present

## 2012-03-28 DIAGNOSIS — H402290 Chronic angle-closure glaucoma, unspecified eye, stage unspecified: Secondary | ICD-10-CM | POA: Diagnosis not present

## 2012-03-28 DIAGNOSIS — H2510 Age-related nuclear cataract, unspecified eye: Secondary | ICD-10-CM | POA: Diagnosis not present

## 2012-03-28 DIAGNOSIS — H409 Unspecified glaucoma: Secondary | ICD-10-CM | POA: Diagnosis not present

## 2012-03-28 DIAGNOSIS — Z1331 Encounter for screening for depression: Secondary | ICD-10-CM | POA: Diagnosis not present

## 2012-04-18 DIAGNOSIS — H402290 Chronic angle-closure glaucoma, unspecified eye, stage unspecified: Secondary | ICD-10-CM | POA: Diagnosis not present

## 2012-04-18 DIAGNOSIS — H409 Unspecified glaucoma: Secondary | ICD-10-CM | POA: Diagnosis not present

## 2012-04-18 DIAGNOSIS — H538 Other visual disturbances: Secondary | ICD-10-CM | POA: Diagnosis not present

## 2012-04-18 DIAGNOSIS — H2510 Age-related nuclear cataract, unspecified eye: Secondary | ICD-10-CM | POA: Diagnosis not present

## 2012-04-18 DIAGNOSIS — E119 Type 2 diabetes mellitus without complications: Secondary | ICD-10-CM | POA: Diagnosis not present

## 2012-04-18 DIAGNOSIS — H01009 Unspecified blepharitis unspecified eye, unspecified eyelid: Secondary | ICD-10-CM | POA: Diagnosis not present

## 2012-04-25 DIAGNOSIS — N63 Unspecified lump in unspecified breast: Secondary | ICD-10-CM | POA: Diagnosis not present

## 2012-05-07 DIAGNOSIS — H2510 Age-related nuclear cataract, unspecified eye: Secondary | ICD-10-CM | POA: Diagnosis not present

## 2012-05-07 DIAGNOSIS — H269 Unspecified cataract: Secondary | ICD-10-CM | POA: Diagnosis not present

## 2012-06-27 DIAGNOSIS — E119 Type 2 diabetes mellitus without complications: Secondary | ICD-10-CM | POA: Diagnosis not present

## 2012-06-27 DIAGNOSIS — J301 Allergic rhinitis due to pollen: Secondary | ICD-10-CM | POA: Diagnosis not present

## 2012-06-27 DIAGNOSIS — R7309 Other abnormal glucose: Secondary | ICD-10-CM | POA: Diagnosis not present

## 2012-06-27 DIAGNOSIS — Z0000 Encounter for general adult medical examination without abnormal findings: Secondary | ICD-10-CM | POA: Diagnosis not present

## 2012-06-27 DIAGNOSIS — I10 Essential (primary) hypertension: Secondary | ICD-10-CM | POA: Diagnosis not present

## 2012-07-02 DIAGNOSIS — H2510 Age-related nuclear cataract, unspecified eye: Secondary | ICD-10-CM | POA: Diagnosis not present

## 2012-07-02 DIAGNOSIS — H269 Unspecified cataract: Secondary | ICD-10-CM | POA: Diagnosis not present

## 2012-07-04 DIAGNOSIS — Q828 Other specified congenital malformations of skin: Secondary | ICD-10-CM | POA: Diagnosis not present

## 2012-07-04 DIAGNOSIS — M79609 Pain in unspecified limb: Secondary | ICD-10-CM | POA: Diagnosis not present

## 2012-07-04 DIAGNOSIS — B351 Tinea unguium: Secondary | ICD-10-CM | POA: Diagnosis not present

## 2012-10-02 DIAGNOSIS — D1801 Hemangioma of skin and subcutaneous tissue: Secondary | ICD-10-CM | POA: Diagnosis not present

## 2012-10-02 DIAGNOSIS — L821 Other seborrheic keratosis: Secondary | ICD-10-CM | POA: Diagnosis not present

## 2012-10-02 DIAGNOSIS — L299 Pruritus, unspecified: Secondary | ICD-10-CM | POA: Diagnosis not present

## 2012-10-02 DIAGNOSIS — L723 Sebaceous cyst: Secondary | ICD-10-CM | POA: Diagnosis not present

## 2012-10-02 DIAGNOSIS — Z85828 Personal history of other malignant neoplasm of skin: Secondary | ICD-10-CM | POA: Diagnosis not present

## 2012-10-02 DIAGNOSIS — D239 Other benign neoplasm of skin, unspecified: Secondary | ICD-10-CM | POA: Diagnosis not present

## 2012-10-24 DIAGNOSIS — B351 Tinea unguium: Secondary | ICD-10-CM | POA: Diagnosis not present

## 2012-10-24 DIAGNOSIS — M79609 Pain in unspecified limb: Secondary | ICD-10-CM | POA: Diagnosis not present

## 2012-10-24 DIAGNOSIS — Q828 Other specified congenital malformations of skin: Secondary | ICD-10-CM | POA: Diagnosis not present

## 2012-12-11 DIAGNOSIS — I10 Essential (primary) hypertension: Secondary | ICD-10-CM | POA: Diagnosis not present

## 2012-12-11 DIAGNOSIS — E119 Type 2 diabetes mellitus without complications: Secondary | ICD-10-CM | POA: Diagnosis not present

## 2012-12-11 DIAGNOSIS — E559 Vitamin D deficiency, unspecified: Secondary | ICD-10-CM | POA: Diagnosis not present

## 2012-12-16 DIAGNOSIS — I10 Essential (primary) hypertension: Secondary | ICD-10-CM | POA: Diagnosis not present

## 2012-12-16 DIAGNOSIS — Z23 Encounter for immunization: Secondary | ICD-10-CM | POA: Diagnosis not present

## 2012-12-16 DIAGNOSIS — K219 Gastro-esophageal reflux disease without esophagitis: Secondary | ICD-10-CM | POA: Diagnosis not present

## 2012-12-16 DIAGNOSIS — I8390 Asymptomatic varicose veins of unspecified lower extremity: Secondary | ICD-10-CM | POA: Diagnosis not present

## 2013-01-23 DIAGNOSIS — H35379 Puckering of macula, unspecified eye: Secondary | ICD-10-CM | POA: Diagnosis not present

## 2013-01-23 DIAGNOSIS — H402290 Chronic angle-closure glaucoma, unspecified eye, stage unspecified: Secondary | ICD-10-CM | POA: Diagnosis not present

## 2013-01-23 DIAGNOSIS — E119 Type 2 diabetes mellitus without complications: Secondary | ICD-10-CM | POA: Diagnosis not present

## 2013-01-23 DIAGNOSIS — Z961 Presence of intraocular lens: Secondary | ICD-10-CM | POA: Diagnosis not present

## 2013-01-23 DIAGNOSIS — H01009 Unspecified blepharitis unspecified eye, unspecified eyelid: Secondary | ICD-10-CM | POA: Diagnosis not present

## 2013-01-23 DIAGNOSIS — D3130 Benign neoplasm of unspecified choroid: Secondary | ICD-10-CM | POA: Diagnosis not present

## 2013-03-24 DIAGNOSIS — IMO0001 Reserved for inherently not codable concepts without codable children: Secondary | ICD-10-CM | POA: Diagnosis not present

## 2013-03-24 DIAGNOSIS — E7800 Pure hypercholesterolemia, unspecified: Secondary | ICD-10-CM | POA: Diagnosis not present

## 2013-04-16 DIAGNOSIS — IMO0001 Reserved for inherently not codable concepts without codable children: Secondary | ICD-10-CM | POA: Diagnosis not present

## 2013-04-16 DIAGNOSIS — I10 Essential (primary) hypertension: Secondary | ICD-10-CM | POA: Diagnosis not present

## 2013-04-16 DIAGNOSIS — K219 Gastro-esophageal reflux disease without esophagitis: Secondary | ICD-10-CM | POA: Diagnosis not present

## 2013-04-16 DIAGNOSIS — D693 Immune thrombocytopenic purpura: Secondary | ICD-10-CM | POA: Diagnosis not present

## 2013-04-29 DIAGNOSIS — Z803 Family history of malignant neoplasm of breast: Secondary | ICD-10-CM | POA: Diagnosis not present

## 2013-04-29 DIAGNOSIS — Z1231 Encounter for screening mammogram for malignant neoplasm of breast: Secondary | ICD-10-CM | POA: Diagnosis not present

## 2013-05-28 DIAGNOSIS — Z961 Presence of intraocular lens: Secondary | ICD-10-CM | POA: Diagnosis not present

## 2013-05-28 DIAGNOSIS — H402290 Chronic angle-closure glaucoma, unspecified eye, stage unspecified: Secondary | ICD-10-CM | POA: Diagnosis not present

## 2013-05-28 DIAGNOSIS — H01009 Unspecified blepharitis unspecified eye, unspecified eyelid: Secondary | ICD-10-CM | POA: Diagnosis not present

## 2013-07-14 DIAGNOSIS — E119 Type 2 diabetes mellitus without complications: Secondary | ICD-10-CM | POA: Diagnosis not present

## 2013-07-14 DIAGNOSIS — I10 Essential (primary) hypertension: Secondary | ICD-10-CM | POA: Diagnosis not present

## 2013-07-17 DIAGNOSIS — E119 Type 2 diabetes mellitus without complications: Secondary | ICD-10-CM | POA: Diagnosis not present

## 2013-07-17 DIAGNOSIS — R42 Dizziness and giddiness: Secondary | ICD-10-CM | POA: Diagnosis not present

## 2013-07-17 DIAGNOSIS — L299 Pruritus, unspecified: Secondary | ICD-10-CM | POA: Diagnosis not present

## 2013-08-27 DIAGNOSIS — H524 Presbyopia: Secondary | ICD-10-CM | POA: Diagnosis not present

## 2013-08-27 DIAGNOSIS — E119 Type 2 diabetes mellitus without complications: Secondary | ICD-10-CM | POA: Diagnosis not present

## 2013-08-27 DIAGNOSIS — H402290 Chronic angle-closure glaucoma, unspecified eye, stage unspecified: Secondary | ICD-10-CM | POA: Diagnosis not present

## 2013-09-26 DIAGNOSIS — H402290 Chronic angle-closure glaucoma, unspecified eye, stage unspecified: Secondary | ICD-10-CM | POA: Diagnosis not present

## 2013-10-21 DIAGNOSIS — H402290 Chronic angle-closure glaucoma, unspecified eye, stage unspecified: Secondary | ICD-10-CM | POA: Diagnosis not present

## 2013-11-17 DIAGNOSIS — E119 Type 2 diabetes mellitus without complications: Secondary | ICD-10-CM | POA: Diagnosis not present

## 2013-11-17 DIAGNOSIS — E780 Pure hypercholesterolemia: Secondary | ICD-10-CM | POA: Diagnosis not present

## 2013-11-18 ENCOUNTER — Ambulatory Visit (INDEPENDENT_AMBULATORY_CARE_PROVIDER_SITE_OTHER): Payer: Medicare Other | Admitting: Podiatry

## 2013-11-18 ENCOUNTER — Encounter: Payer: Self-pay | Admitting: Podiatry

## 2013-11-18 DIAGNOSIS — M79673 Pain in unspecified foot: Secondary | ICD-10-CM

## 2013-11-18 DIAGNOSIS — B351 Tinea unguium: Secondary | ICD-10-CM

## 2013-11-18 NOTE — Patient Instructions (Signed)
Diabetes and Foot Care Diabetes may cause you to have problems because of poor blood supply (circulation) to your feet and legs. This may cause the skin on your feet to become thinner, break easier, and heal more slowly. Your skin may become dry, and the skin may peel and crack. You may also have nerve damage in your legs and feet causing decreased feeling in them. You may not notice minor injuries to your feet that could lead to infections or more serious problems. Taking care of your feet is one of the most important things you can do for yourself.  HOME CARE INSTRUCTIONS  Wear shoes at all times, even in the house. Do not go barefoot. Bare feet are easily injured.  Check your feet daily for blisters, cuts, and redness. If you cannot see the bottom of your feet, use a mirror or ask someone for help.  Wash your feet with warm water (do not use hot water) and mild soap. Then pat your feet and the areas between your toes until they are completely dry. Do not soak your feet as this can dry your skin.  Apply a moisturizing lotion or petroleum jelly (that does not contain alcohol and is unscented) to the skin on your feet and to dry, brittle toenails. Do not apply lotion between your toes.  Trim your toenails straight across. Do not dig under them or around the cuticle. File the edges of your nails with an emery board or nail file.  Do not cut corns or calluses or try to remove them with medicine.  Wear clean socks or stockings every day. Make sure they are not too tight. Do not wear knee-high stockings since they may decrease blood flow to your legs.  Wear shoes that fit properly and have enough cushioning. To break in new shoes, wear them for just a few hours a day. This prevents you from injuring your feet. Always look in your shoes before you put them on to be sure there are no objects inside.  Do not cross your legs. This may decrease the blood flow to your feet.  If you find a minor scrape,  cut, or break in the skin on your feet, keep it and the skin around it clean and dry. These areas may be cleansed with mild soap and water. Do not cleanse the area with peroxide, alcohol, or iodine.  When you remove an adhesive bandage, be sure not to damage the skin around it.  If you have a wound, look at it several times a day to make sure it is healing.  Do not use heating pads or hot water bottles. They may burn your skin. If you have lost feeling in your feet or legs, you may not know it is happening until it is too late.  Make sure your health care provider performs a complete foot exam at least annually or more often if you have foot problems. Report any cuts, sores, or bruises to your health care provider immediately. SEEK MEDICAL CARE IF:   You have an injury that is not healing.  You have cuts or breaks in the skin.  You have an ingrown nail.  You notice redness on your legs or feet.  You feel burning or tingling in your legs or feet.  You have pain or cramps in your legs and feet.  Your legs or feet are numb.  Your feet always feel cold. SEEK IMMEDIATE MEDICAL CARE IF:   There is increasing redness,   swelling, or pain in or around a wound.  There is a red line that goes up your leg.  Pus is coming from a wound.  You develop a fever or as directed by your health care provider.  You notice a bad smell coming from an ulcer or wound. Document Released: 01/21/2000 Document Revised: 09/25/2012 Document Reviewed: 07/02/2012 ExitCare Patient Information 2015 [employer]. This information is not intended to replace advice given to you by your health care provider. Make sure you discuss any questions you have with your health care provider.  

## 2013-11-18 NOTE — Progress Notes (Signed)
Subjective:     Patient ID: Chelsea [name], female   DOB: Jul 16, 1928, 80 y.o.   MRN: [id number]  HPI patient presents stating I have pain in my nails and they're thick and I cannot cut   Review of Systems     Objective:   Physical Exam Neurovascular status intact with thick yellow brittle toenails 1-5 both feet    Assessment:     Mycotic nail infection with pain 1-5 both feet    Plan:     Debris painful nailbeds 1-5 both feet with no iatrogenic bleeding noted

## 2013-11-24 DIAGNOSIS — Z0000 Encounter for general adult medical examination without abnormal findings: Secondary | ICD-10-CM | POA: Diagnosis not present

## 2013-11-24 DIAGNOSIS — E1165 Type 2 diabetes mellitus with hyperglycemia: Secondary | ICD-10-CM | POA: Diagnosis not present

## 2013-11-24 DIAGNOSIS — Z1389 Encounter for screening for other disorder: Secondary | ICD-10-CM | POA: Diagnosis not present

## 2013-11-24 DIAGNOSIS — Z23 Encounter for immunization: Secondary | ICD-10-CM | POA: Diagnosis not present

## 2013-12-18 DIAGNOSIS — H402213 Chronic angle-closure glaucoma, right eye, severe stage: Secondary | ICD-10-CM | POA: Diagnosis not present

## 2013-12-18 DIAGNOSIS — Z23 Encounter for immunization: Secondary | ICD-10-CM | POA: Diagnosis not present

## 2013-12-18 DIAGNOSIS — H402221 Chronic angle-closure glaucoma, left eye, mild stage: Secondary | ICD-10-CM | POA: Diagnosis not present

## 2014-02-23 ENCOUNTER — Ambulatory Visit (INDEPENDENT_AMBULATORY_CARE_PROVIDER_SITE_OTHER): Payer: Medicare Other | Admitting: Podiatry

## 2014-02-23 DIAGNOSIS — M79673 Pain in unspecified foot: Secondary | ICD-10-CM

## 2014-02-23 DIAGNOSIS — B351 Tinea unguium: Secondary | ICD-10-CM | POA: Diagnosis not present

## 2014-02-23 DIAGNOSIS — E1165 Type 2 diabetes mellitus with hyperglycemia: Secondary | ICD-10-CM | POA: Diagnosis not present

## 2014-02-23 DIAGNOSIS — I10 Essential (primary) hypertension: Secondary | ICD-10-CM | POA: Diagnosis not present

## 2014-02-23 NOTE — Progress Notes (Signed)
Patient ID: Chelsea [name], female   DOB: 01-16-1929, 81 y.o.   MRN: [id number]  Subjective: 81 year old female presents the office for painful, elongated toenails which are painful particularly shoe gear. She states they are thickened she and is unable to cut them herself. She denies any recent redness or drainage from the nail sites. No other complaints at this time.  Objective: AAO 3, NAD DP/PT pulses palpable, CRT less than 3 seconds Protective sensation appears to be intact with Simms was monofilament Nails hypertrophic, dystrophic, elongated, brittle, discolored 10. No swelling erythema or drainage from the nail sites. No open lesions or pre-ulcer lesions identified. No pain with calf compression, swelling, warmth, erythema. No areas of tenderness bilateral lower extremities there is no overlying edema, erythema, increase in warmth.  Assessment: 81 year old female with symptomatic onychomycosis  Plan: -Nails are showing debrided 10 without complication/bleeding -Discussed daily foot inspection. -Follow-up in 3 months or sooner should any problems arise (She would like to follow-up in 4 months) In the meantime, encouraged call the office with any questions, concerns, change in symptoms.

## 2014-02-26 DIAGNOSIS — D649 Anemia, unspecified: Secondary | ICD-10-CM | POA: Diagnosis not present

## 2014-02-26 DIAGNOSIS — E1165 Type 2 diabetes mellitus with hyperglycemia: Secondary | ICD-10-CM | POA: Diagnosis not present

## 2014-02-26 DIAGNOSIS — I10 Essential (primary) hypertension: Secondary | ICD-10-CM | POA: Diagnosis not present

## 2014-02-26 DIAGNOSIS — K219 Gastro-esophageal reflux disease without esophagitis: Secondary | ICD-10-CM | POA: Diagnosis not present

## 2014-03-02 ENCOUNTER — Other Ambulatory Visit: Payer: Medicare Other

## 2014-03-16 DIAGNOSIS — Z1212 Encounter for screening for malignant neoplasm of rectum: Secondary | ICD-10-CM | POA: Diagnosis not present

## 2014-03-19 ENCOUNTER — Encounter: Payer: Self-pay | Admitting: Gastroenterology

## 2014-03-24 ENCOUNTER — Ambulatory Visit (INDEPENDENT_AMBULATORY_CARE_PROVIDER_SITE_OTHER): Payer: Medicare Other | Admitting: Gastroenterology

## 2014-03-24 ENCOUNTER — Encounter: Payer: Self-pay | Admitting: Gastroenterology

## 2014-03-24 VITALS — BP 140/86 | HR 88 | Ht 63.5 in | Wt 201.0 lb

## 2014-03-24 DIAGNOSIS — Z862 Personal history of diseases of the blood and blood-forming organs and certain disorders involving the immune mechanism: Secondary | ICD-10-CM | POA: Diagnosis not present

## 2014-03-24 DIAGNOSIS — R195 Other fecal abnormalities: Secondary | ICD-10-CM | POA: Insufficient documentation

## 2014-03-24 NOTE — Patient Instructions (Signed)
You have been scheduled for a colonoscopy. Please follow written instructions given to you at your visit today.  Please pick up your prep kit at the pharmacy within the next 1-3 days. If you use inhalers (even only as needed), please bring them with you on the day of your procedure.  

## 2014-03-24 NOTE — Progress Notes (Signed)
_                                                                                                                History of Present Illness:  Ms. Gray is a pleasant 81 year old white female with history of ITP and diabetes referred for evaluation of Hemoccult-positive stool.  This was noted on routine testing.  The patient has no GI complaints including change of bowel habits, abdominal pain, melena or hematochezia.  Approximately one year ago hemoglobin was 12.5.  In January it was 11.1 with MCV 80.  Platelet count was 269,000.  The patient does not take any gastric irritants including nonsteroidals.  Last colonoscopy was over 10 years ago.   Past Medical History  Diagnosis Date  . ITP (idiopathic thrombocytopenic purpura) 2/09  . Basal cell carcinoma of breast 1998  . HTN (hypertension)   . DM (diabetes mellitus)   . Dyslipidemia   . Osteoporosis   . Hyperparathyroidism, primary 07/05/06  . Stroke 02/03/09    mid brain, diplopia  . Hyperlipidemia   . Glaucoma    Past Surgical History  Procedure Laterality Date  . Tonsillectomy     family history includes Breast cancer in her mother; Colon cancer in her father; Diabetes in her father; Heart disease in her maternal grandfather; Multiple sclerosis in her daughter. Current Outpatient Prescriptions  Medication Sig Dispense Refill  . aspirin 81 MG tablet Take 81 mg by mouth daily.    . calcium carbonate (TUMS - DOSED IN MG ELEMENTAL CALCIUM) 500 MG chewable tablet Chew 1 tablet by mouth as needed for indigestion or heartburn.    . Cholecalciferol (VITAMIN D3) 2000 UNITS TABS Take 2,000 Units by mouth daily.    Marland Kitchen Fexofenadine HCl (ALLEGRA PO) Take 180 mg by mouth daily.     . metFORMIN (GLUCOPHAGE-XR) 500 MG 24 hr tablet Take 500 mg by mouth 2 (two) times daily.    . Omega 3 1200 MG CAPS Take 1 capsule by mouth daily.    . pantoprazole (PROTONIX) 40 MG tablet Take 40 mg by mouth as needed.    .  valsartan-hydrochlorothiazide (DIOVAN-HCT) 320-25 MG per tablet Take 0.5 tablets by mouth daily.      No current facility-administered medications for this visit.   Allergies as of 03/24/2014 - Review Complete 03/24/2014  Allergen Reaction Noted  . Travatan z [travoprost (bak free)]  03/24/2014    reports that she has never smoked. She has never used smokeless tobacco. She reports that she does not drink alcohol. Her drug history is not on file.   Review of Systems: She has lower extremity weakness and is unstable and plating.  She walks with a walker.  Pertinent positive and negative review of systems were noted in the above HPI section. All other review of systems were otherwise negative.  Vital signs were reviewed in today's medical record Physical Exam: General: Well developed , well nourished, no acute distress Skin: anicteric Head: Normocephalic and atraumatic Eyes:  sclerae anicteric, EOMI  Ears: Normal auditory acuity Mouth: No deformity or lesions Neck: Supple, no masses or thyromegaly Lungs: Clear throughout to auscultation Heart: Regular rate and rhythm; no murmurs, rubs or bruits Abdomen: Soft, non tender and non distended. No masses, hepatosplenomegaly or hernias noted. Normal Bowel sounds Rectal:deferred Musculoskeletal: Symmetrical with no gross deformities  Skin: No lesions on visible extremities Pulses:  Normal pulses noted Extremities: No clubbing, cyanosis, edema or deformities noted Neurological: Alert oriented x 4, grossly nonfocal Cervical Nodes:  No significant cervical adenopathy Inguinal Nodes: No significant inguinal adenopathy Psychological:  Alert and cooperative. Normal mood and affect  See Assessment and Plan under Problem List

## 2014-03-24 NOTE — Assessment & Plan Note (Signed)
Hemoccult-positive stool with approximate 1.5 g in hemoglobin drop in the past year and microcytic RBC indices certainly suggest a chronic GI bleeding source.  Occult malignancy, AVMs and polyps should be ruled out.  Recommendations #1 colonoscopy; if negative I will schedule upper endoscopy

## 2014-04-06 ENCOUNTER — Telehealth: Payer: Self-pay | Admitting: Gastroenterology

## 2014-04-06 MED ORDER — NA SULFATE-K SULFATE-MG SULF 17.5-3.13-1.6 GM/177ML PO SOLN
1.0000 | Freq: Once | ORAL | Status: DC
Start: 2014-04-06 — End: 2014-05-11

## 2014-04-06 NOTE — Telephone Encounter (Signed)
Prep sent to the pharmacy

## 2014-04-10 DIAGNOSIS — H402221 Chronic angle-closure glaucoma, left eye, mild stage: Secondary | ICD-10-CM | POA: Diagnosis not present

## 2014-04-10 DIAGNOSIS — H402213 Chronic angle-closure glaucoma, right eye, severe stage: Secondary | ICD-10-CM | POA: Diagnosis not present

## 2014-05-11 ENCOUNTER — Ambulatory Visit (AMBULATORY_SURGERY_CENTER): Payer: Medicare Other | Admitting: Gastroenterology

## 2014-05-11 ENCOUNTER — Encounter: Payer: Self-pay | Admitting: Gastroenterology

## 2014-05-11 VITALS — BP 114/58 | HR 67 | Temp 97.5°F | Resp 26 | Ht 63.5 in | Wt 201.0 lb

## 2014-05-11 DIAGNOSIS — K5731 Diverticulosis of large intestine without perforation or abscess with bleeding: Secondary | ICD-10-CM

## 2014-05-11 DIAGNOSIS — R195 Other fecal abnormalities: Secondary | ICD-10-CM | POA: Diagnosis not present

## 2014-05-11 DIAGNOSIS — E119 Type 2 diabetes mellitus without complications: Secondary | ICD-10-CM | POA: Diagnosis not present

## 2014-05-11 DIAGNOSIS — K5730 Diverticulosis of large intestine without perforation or abscess without bleeding: Secondary | ICD-10-CM

## 2014-05-11 DIAGNOSIS — Z8673 Personal history of transient ischemic attack (TIA), and cerebral infarction without residual deficits: Secondary | ICD-10-CM | POA: Diagnosis not present

## 2014-05-11 DIAGNOSIS — I10 Essential (primary) hypertension: Secondary | ICD-10-CM | POA: Diagnosis not present

## 2014-05-11 MED ORDER — SODIUM CHLORIDE 0.9 % IV SOLN: 500.0000 mL | INTRAVENOUS | Status: DC

## 2014-05-11 NOTE — Patient Instructions (Addendum)
YOU HAD AN ENDOSCOPIC PROCEDURE TODAY AT [facility]:   Refer to the procedure report that was given to you for any specific questions about what was found during the examination.  If the procedure report does not answer your questions, please call your gastroenterologist to clarify.  If you requested that your care partner not be given the details of your procedure findings, then the procedure report has been included in a sealed envelope for you to review at your convenience later.  YOU SHOULD EXPECT: Some feelings of bloating in the abdomen. Passage of more gas than usual.  Walking can help get rid of the air that was put into your GI tract during the procedure and reduce the bloating. If you had a lower endoscopy (such as a colonoscopy or flexible sigmoidoscopy) you may notice spotting of blood in your stool or on the toilet paper. If you underwent a bowel prep for your procedure, you may not have a normal bowel movement for a few days.  Please Note:  You might notice some irritation and congestion in your nose or some drainage.  This is from the oxygen used during your procedure.  There is no need for concern and it should clear up in a day or so.  SYMPTOMS TO REPORT IMMEDIATELY:   Following lower endoscopy (colonoscopy or flexible sigmoidoscopy):  Excessive amounts of blood in the stool  Significant tenderness or worsening of abdominal pains  Swelling of the abdomen that is new, acute  Fever of 100F or higher    For urgent or emergent issues, a gastroenterologist can be reached at any hour by calling [phone].   DIET: Your first meal following the procedure should be a small meal and then it is ok to progress to your normal diet. Heavy or fried foods are harder to digest and may make you feel nauseous or bloated.  Likewise, meals heavy in dairy and vegetables can increase bloating.  Drink plenty of fluids but you should avoid alcoholic beverages for 24  hours.  ACTIVITY:  You should plan to take it easy for the rest of today and you should NOT DRIVE or use heavy machinery until tomorrow (because of the sedation medicines used during the test).    FOLLOW UP: Our staff will call the number listed on your records the next business day following your procedure to check on you and address any questions or concerns that you may have regarding the information given to you following your procedure. If we do not reach you, we will leave a message.  However, if you are feeling well and you are not experiencing any problems, there is no need to return our call.  We will assume that you have returned to your regular daily activities without incident.  If any biopsies were taken you will be contacted by phone or by letter within the next 1-3 weeks.  Please call us at [phone] if you have not heard about the biopsies in 3 weeks.    SIGNATURES/CONFIDENTIALITY: You and/or your care partner have signed paperwork which will be entered into your electronic medical record.  These signatures attest to the fact that that the information above on your After Visit Summary has been reviewed and is understood.  Full responsibility of the confidentiality of this discharge information lies with you and/or your care-partner.   INFORMATION ON DIVERTICULOSIS GIVEN TO YOU TODAY  PRE VISIT WITH NURSE FOR ENDOSCOPY INSTRUCTIONS SET UP AND ENDOSCOPY SET UP SEE  TOP OF PAGE FOR DATE AND TIME FOR [facility]

## 2014-05-11 NOTE — Op Note (Signed)
[place]  [address] [place], [address]   COLONOSCOPY PROCEDURE REPORT  PATIENT: [name], Chelsea  MR#: [id number] BIRTHDATE: [birth year] , 85  yrs. old GENDER: female ENDOSCOPIST: Inda Castle, MD REFERRED BY: PROCEDURE DATE:  05/11/2014 PROCEDURE:   Colonoscopy, diagnostic First Screening Colonoscopy - Avg.  risk and is 50 yrs.  old or older - No.  Prior Negative Screening - Now for repeat screening. 10 or more years since last screening  History of Adenoma - Now for follow-up colonoscopy & has been > or = to 3 yrs.  N/A ASA CLASS:   Class II INDICATIONS:Evaluation of unexplained GI bleeding and heme-positive stool. MEDICATIONS: Monitored anesthesia care and Propofol 140 mg IV  DESCRIPTION OF PROCEDURE:   After the risks benefits and alternatives of the procedure were thoroughly explained, informed consent was obtained.  The digital rectal exam revealed no abnormalities of the rectum.   The LB CF-H180AL Loaner [id number] endoscope was introduced through the anus and advanced to the cecum, which was identified by both the appendix and ileocecal valve. No adverse events experienced.   The quality of the prep was (Suprep was used) good.  The instrument was then slowly withdrawn as the colon was fully examined.      COLON FINDINGS: There was mild diverticulosis noted in the ascending colon.   The examination was otherwise normal.  Retroflexed views revealed no abnormalities. The time to cecum = 2.9 Withdrawal time = 6.2   The scope was withdrawn and the procedure completed. COMPLICATIONS: There were no immediate complications.  ENDOSCOPIC IMPRESSION: 1.   Mild diverticulosis was noted in the ascending colon 2.   The examination was otherwise normal  RECOMMENDATIONS: Upper endoscopy will be scheduled  eSigned:  Inda Castle, MD 05/11/2014 1:21 PM   cc: [name], MD

## 2014-05-11 NOTE — Progress Notes (Signed)
Report to PACU, RN, vss, BBS= Clear.  

## 2014-05-12 ENCOUNTER — Telehealth: Payer: Self-pay

## 2014-05-12 NOTE — Telephone Encounter (Signed)
  Follow up Call-  Call back number 05/11/2014  Post procedure Call Back phone  # [phone]  Permission to leave phone message Yes     Patient questions:  Do you have a fever, pain , or abdominal swelling? No. Pain Score  0 *  Have you tolerated food without any problems? Yes.    Have you been able to return to your normal activities? Yes.    Do you have any questions about your discharge instructions: Diet   No. Medications  No. Follow up visit  No.  Do you have questions or concerns about your Care? No.  Actions: * If pain score is 4 or above: No action needed, pain <4.

## 2014-05-25 ENCOUNTER — Ambulatory Visit: Payer: Medicare Other

## 2014-05-26 ENCOUNTER — Ambulatory Visit (AMBULATORY_SURGERY_CENTER): Payer: Self-pay

## 2014-05-26 VITALS — Ht 62.5 in | Wt 198.6 lb

## 2014-05-26 DIAGNOSIS — R195 Other fecal abnormalities: Secondary | ICD-10-CM

## 2014-05-26 NOTE — Progress Notes (Signed)
Per pt, no allergies to soy or egg products.Pt not taking any weight loss meds or using  O2 at home. 

## 2014-06-08 ENCOUNTER — Ambulatory Visit: Payer: Medicare Other

## 2014-06-09 ENCOUNTER — Encounter: Payer: Self-pay | Admitting: Gastroenterology

## 2014-06-09 ENCOUNTER — Ambulatory Visit (AMBULATORY_SURGERY_CENTER): Payer: Medicare Other | Admitting: Gastroenterology

## 2014-06-09 VITALS — BP 107/59 | HR 67 | Temp 97.2°F | Resp 20 | Ht 62.0 in | Wt 198.0 lb

## 2014-06-09 DIAGNOSIS — K921 Melena: Secondary | ICD-10-CM | POA: Diagnosis not present

## 2014-06-09 DIAGNOSIS — I10 Essential (primary) hypertension: Secondary | ICD-10-CM | POA: Diagnosis not present

## 2014-06-09 DIAGNOSIS — R195 Other fecal abnormalities: Secondary | ICD-10-CM | POA: Diagnosis not present

## 2014-06-09 DIAGNOSIS — Z8673 Personal history of transient ischemic attack (TIA), and cerebral infarction without residual deficits: Secondary | ICD-10-CM | POA: Diagnosis not present

## 2014-06-09 DIAGNOSIS — E119 Type 2 diabetes mellitus without complications: Secondary | ICD-10-CM | POA: Diagnosis not present

## 2014-06-09 DIAGNOSIS — K222 Esophageal obstruction: Secondary | ICD-10-CM | POA: Diagnosis not present

## 2014-06-09 DIAGNOSIS — E213 Hyperparathyroidism, unspecified: Secondary | ICD-10-CM | POA: Diagnosis not present

## 2014-06-09 DIAGNOSIS — K449 Diaphragmatic hernia without obstruction or gangrene: Secondary | ICD-10-CM | POA: Diagnosis not present

## 2014-06-09 MED ORDER — SODIUM CHLORIDE 0.9 % IV SOLN: 500.0000 mL | INTRAVENOUS | Status: DC

## 2014-06-09 NOTE — Patient Instructions (Signed)
Discharge instructions given. Normal exam. Hemoccults cards given in recovery room. Handout on a hiatal hernia. Resume previous medications. YOU HAD AN ENDOSCOPIC PROCEDURE TODAY AT [facility]:   Refer to the procedure report that was given to you for any specific questions about what was found during the examination.  If the procedure report does not answer your questions, please call your gastroenterologist to clarify.  If you requested that your care partner not be given the details of your procedure findings, then the procedure report has been included in a sealed envelope for you to review at your convenience later.  YOU SHOULD EXPECT: Some feelings of bloating in the abdomen. Passage of more gas than usual.  Walking can help get rid of the air that was put into your GI tract during the procedure and reduce the bloating. If you had a lower endoscopy (such as a colonoscopy or flexible sigmoidoscopy) you may notice spotting of blood in your stool or on the toilet paper. If you underwent a bowel prep for your procedure, you may not have a normal bowel movement for a few days.  Please Note:  You might notice some irritation and congestion in your nose or some drainage.  This is from the oxygen used during your procedure.  There is no need for concern and it should clear up in a day or so.  SYMPTOMS TO REPORT IMMEDIATELY:    Following upper endoscopy (EGD)  Vomiting of blood or coffee ground material  New chest pain or pain under the shoulder blades  Painful or persistently difficult swallowing  New shortness of breath  Fever of 100F or higher  Black, tarry-looking stools  For urgent or emergent issues, a gastroenterologist can be reached at any hour by calling [phone].   DIET: Your first meal following the procedure should be a small meal and then it is ok to progress to your normal diet. Heavy or fried foods are harder to digest and may make you feel nauseous or  bloated.  Likewise, meals heavy in dairy and vegetables can increase bloating.  Drink plenty of fluids but you should avoid alcoholic beverages for 24 hours.  ACTIVITY:  You should plan to take it easy for the rest of today and you should NOT DRIVE or use heavy machinery until tomorrow (because of the sedation medicines used during the test).    FOLLOW UP: Our staff will call the number listed on your records the next business day following your procedure to check on you and address any questions or concerns that you may have regarding the information given to you following your procedure. If we do not reach you, we will leave a message.  However, if you are feeling well and you are not experiencing any problems, there is no need to return our call.  We will assume that you have returned to your regular daily activities without incident.  If any biopsies were taken you will be contacted by phone or by letter within the next 1-3 weeks.  Please call us at [phone] if you have not heard about the biopsies in 3 weeks.    SIGNATURES/CONFIDENTIALITY: You and/or your care partner have signed paperwork which will be entered into your electronic medical record.  These signatures attest to the fact that that the information above on your After Visit Summary has been reviewed and is understood.  Full responsibility of the confidentiality of this discharge information lies with you and/or your care-partner.

## 2014-06-09 NOTE — Progress Notes (Signed)
Stable to RR 

## 2014-06-09 NOTE — Op Note (Addendum)
[place]  [address] [place], [address]   ENDOSCOPY PROCEDURE REPORT  PATIENT: Chelsea, [name]  MR#: [id number] BIRTHDATE: Sep 24, 1928 , 85  yrs. old GENDER: female ENDOSCOPIST: Inda Castle, MD REFERRED BY:  [name], M.D. PROCEDURE DATE:  06/09/2014 PROCEDURE:  EGD, diagnostic ASA CLASS:     Class II INDICATIONS:  occult blood positive. MEDICATIONS: Monitored anesthesia care, Propofol 100 mg IV, Lidocaine 40 mg IV, and Simethicone 40mg  PO TOPICAL ANESTHETIC:  DESCRIPTION OF PROCEDURE: After the risks benefits and alternatives of the procedure were thoroughly explained, informed consent was obtained.  The LB [id number] endoscope was introduced through the mouth and advanced to the second portion of the duodenum , Without limitations.  The instrument was slowly withdrawn as the mucosa was fully examined.    ESOPHAGUS: There was a peptic stricture at the gastroesophageal junction.  The stricture was traversable. STOMACH: A 7 cm hiatal hernia was noted.  cm hiatal hernia was noted. The remainder the exam was normal. Retroflexed views revealed no abnormalities.     The scope was then withdrawn from the patient and the procedure completed.  COMPLICATIONS: There were no immediate complications.  ENDOSCOPIC IMPRESSION: 1.   There was a stricture at the gastroesophageal junction 2.  large sliding hiatal hernia 3.   EGD was otherwise normal  RECOMMENDATIONS: Hemmoccult stools  REPEAT EXAM:  eSigned:  Inda Castle, MD 06/09/2014 10:12 AM Revised: 06/09/2014 10:12 AM    CC:

## 2014-06-10 ENCOUNTER — Telehealth: Payer: Self-pay

## 2014-06-10 NOTE — Telephone Encounter (Signed)
No answer, left voicemail

## 2014-06-17 DIAGNOSIS — N390 Urinary tract infection, site not specified: Secondary | ICD-10-CM | POA: Diagnosis not present

## 2014-06-17 DIAGNOSIS — E559 Vitamin D deficiency, unspecified: Secondary | ICD-10-CM | POA: Diagnosis not present

## 2014-06-17 DIAGNOSIS — D649 Anemia, unspecified: Secondary | ICD-10-CM | POA: Diagnosis not present

## 2014-06-17 DIAGNOSIS — E1165 Type 2 diabetes mellitus with hyperglycemia: Secondary | ICD-10-CM | POA: Diagnosis not present

## 2014-06-17 DIAGNOSIS — I10 Essential (primary) hypertension: Secondary | ICD-10-CM | POA: Diagnosis not present

## 2014-06-22 ENCOUNTER — Ambulatory Visit: Payer: Medicare Other

## 2014-06-24 ENCOUNTER — Telehealth: Payer: Self-pay | Admitting: Gastroenterology

## 2014-06-24 DIAGNOSIS — Z23 Encounter for immunization: Secondary | ICD-10-CM | POA: Diagnosis not present

## 2014-06-24 DIAGNOSIS — D509 Iron deficiency anemia, unspecified: Secondary | ICD-10-CM | POA: Diagnosis not present

## 2014-06-24 DIAGNOSIS — E1165 Type 2 diabetes mellitus with hyperglycemia: Secondary | ICD-10-CM | POA: Diagnosis not present

## 2014-06-24 DIAGNOSIS — E780 Pure hypercholesterolemia: Secondary | ICD-10-CM | POA: Diagnosis not present

## 2014-06-24 DIAGNOSIS — I10 Essential (primary) hypertension: Secondary | ICD-10-CM | POA: Diagnosis not present

## 2014-06-24 NOTE — Telephone Encounter (Signed)
Received records from [facility] forwarded to Dr. Deatra Ina 06/24/14 fbg,

## 2014-06-26 ENCOUNTER — Other Ambulatory Visit: Payer: Self-pay

## 2014-06-26 ENCOUNTER — Other Ambulatory Visit: Payer: Medicare Other

## 2014-06-26 ENCOUNTER — Other Ambulatory Visit (INDEPENDENT_AMBULATORY_CARE_PROVIDER_SITE_OTHER): Payer: Medicare Other

## 2014-06-26 DIAGNOSIS — K921 Melena: Secondary | ICD-10-CM

## 2014-06-26 LAB — HEMOCCULT SLIDES (X 3 CARDS)
FECAL OCCULT BLD: NEGATIVE
OCCULT 1: NEGATIVE
OCCULT 2: NEGATIVE
OCCULT 3: NEGATIVE
OCCULT 4: NEGATIVE
OCCULT 5: POSITIVE — AB

## 2014-06-29 ENCOUNTER — Other Ambulatory Visit: Payer: Self-pay

## 2014-06-29 ENCOUNTER — Telehealth: Payer: Self-pay

## 2014-06-29 DIAGNOSIS — K921 Melena: Secondary | ICD-10-CM

## 2014-06-29 NOTE — Telephone Encounter (Signed)
Patient is interested in procedure, but has to arrange her transportation. Per her request, information is mailed to her home. Call back in 1 week.

## 2014-07-09 ENCOUNTER — Other Ambulatory Visit: Payer: Self-pay

## 2014-07-09 DIAGNOSIS — K921 Melena: Secondary | ICD-10-CM

## 2014-07-21 ENCOUNTER — Ambulatory Visit (INDEPENDENT_AMBULATORY_CARE_PROVIDER_SITE_OTHER): Payer: Self-pay | Admitting: Gastroenterology

## 2014-07-21 DIAGNOSIS — R195 Other fecal abnormalities: Secondary | ICD-10-CM

## 2014-07-21 NOTE — Progress Notes (Signed)
Patient here for capsule endoscopy. Tolerated procedure. Verbalizes understanding of written and verbal instructions. Lot [id number] and expiration 2017-07.

## 2014-07-24 ENCOUNTER — Encounter: Payer: Self-pay | Admitting: Gastroenterology

## 2014-08-20 ENCOUNTER — Telehealth: Payer: Self-pay | Admitting: Gastroenterology

## 2014-08-20 ENCOUNTER — Other Ambulatory Visit: Payer: Self-pay

## 2014-08-20 DIAGNOSIS — D649 Anemia, unspecified: Secondary | ICD-10-CM

## 2014-08-20 NOTE — Telephone Encounter (Signed)
Clarified instructions for the hemoccults cards. Verified for her she does not need an appointment for her CBC and she does not need to fast.

## 2014-08-24 DIAGNOSIS — D509 Iron deficiency anemia, unspecified: Secondary | ICD-10-CM | POA: Diagnosis not present

## 2014-08-31 DIAGNOSIS — H26493 Other secondary cataract, bilateral: Secondary | ICD-10-CM | POA: Diagnosis not present

## 2014-08-31 DIAGNOSIS — H402213 Chronic angle-closure glaucoma, right eye, severe stage: Secondary | ICD-10-CM | POA: Diagnosis not present

## 2014-08-31 DIAGNOSIS — H402221 Chronic angle-closure glaucoma, left eye, mild stage: Secondary | ICD-10-CM | POA: Diagnosis not present

## 2014-08-31 DIAGNOSIS — E119 Type 2 diabetes mellitus without complications: Secondary | ICD-10-CM | POA: Diagnosis not present

## 2014-09-08 ENCOUNTER — Other Ambulatory Visit (INDEPENDENT_AMBULATORY_CARE_PROVIDER_SITE_OTHER): Payer: Medicare Other

## 2014-09-08 DIAGNOSIS — Z85828 Personal history of other malignant neoplasm of skin: Secondary | ICD-10-CM | POA: Diagnosis not present

## 2014-09-08 DIAGNOSIS — C44319 Basal cell carcinoma of skin of other parts of face: Secondary | ICD-10-CM | POA: Diagnosis not present

## 2014-09-08 DIAGNOSIS — D485 Neoplasm of uncertain behavior of skin: Secondary | ICD-10-CM | POA: Diagnosis not present

## 2014-09-08 DIAGNOSIS — C4401 Basal cell carcinoma of skin of lip: Secondary | ICD-10-CM | POA: Diagnosis not present

## 2014-09-08 DIAGNOSIS — L723 Sebaceous cyst: Secondary | ICD-10-CM | POA: Diagnosis not present

## 2014-09-08 DIAGNOSIS — L304 Erythema intertrigo: Secondary | ICD-10-CM | POA: Diagnosis not present

## 2014-09-08 DIAGNOSIS — D2261 Melanocytic nevi of right upper limb, including shoulder: Secondary | ICD-10-CM | POA: Diagnosis not present

## 2014-09-08 DIAGNOSIS — L570 Actinic keratosis: Secondary | ICD-10-CM | POA: Diagnosis not present

## 2014-09-08 DIAGNOSIS — D649 Anemia, unspecified: Secondary | ICD-10-CM | POA: Diagnosis not present

## 2014-09-08 DIAGNOSIS — L821 Other seborrheic keratosis: Secondary | ICD-10-CM | POA: Diagnosis not present

## 2014-09-08 LAB — CBC WITH DIFFERENTIAL/PLATELET
BASOS ABS: 0 10*3/uL (ref 0.0–0.1)
BASOS PCT: 0.6 % (ref 0.0–3.0)
Eosinophils Absolute: 0.1 10*3/uL (ref 0.0–0.7)
Eosinophils Relative: 1.8 % (ref 0.0–5.0)
HCT: 37.3 % (ref 36.0–46.0)
HEMOGLOBIN: 12.1 g/dL (ref 12.0–15.0)
LYMPHS PCT: 34.6 % (ref 12.0–46.0)
Lymphs Abs: 2.2 10*3/uL (ref 0.7–4.0)
MCHC: 32.4 g/dL (ref 30.0–36.0)
MCV: 75.7 fl — ABNORMAL LOW (ref 78.0–100.0)
MONO ABS: 0.7 10*3/uL (ref 0.1–1.0)
MONOS PCT: 10.4 % (ref 3.0–12.0)
NEUTROS ABS: 3.3 10*3/uL (ref 1.4–7.7)
Neutrophils Relative %: 52.6 % (ref 43.0–77.0)
Platelets: 250 10*3/uL (ref 150.0–400.0)
RBC: 4.92 Mil/uL (ref 3.87–5.11)
RDW: 25.1 % — AB (ref 11.5–15.5)
WBC: 6.4 10*3/uL (ref 4.0–10.5)

## 2014-09-09 ENCOUNTER — Other Ambulatory Visit: Payer: Self-pay

## 2014-09-09 DIAGNOSIS — D539 Nutritional anemia, unspecified: Secondary | ICD-10-CM

## 2014-09-09 MED ORDER — FERROUS SULFATE 325 (65 FE) MG PO TABS: 325.0000 mg | ORAL_TABLET | Freq: Every day | ORAL | Status: DC

## 2014-09-10 ENCOUNTER — Other Ambulatory Visit (INDEPENDENT_AMBULATORY_CARE_PROVIDER_SITE_OTHER): Payer: Medicare Other

## 2014-09-10 ENCOUNTER — Other Ambulatory Visit: Payer: Self-pay | Admitting: *Deleted

## 2014-09-10 DIAGNOSIS — K921 Melena: Secondary | ICD-10-CM

## 2014-09-10 LAB — HEMOCCULT SLIDES (X 3 CARDS)
Fecal Occult Blood: NEGATIVE
OCCULT 1: NEGATIVE
OCCULT 2: NEGATIVE
OCCULT 3: NEGATIVE
OCCULT 4: NEGATIVE
OCCULT 5: NEGATIVE

## 2014-09-11 ENCOUNTER — Other Ambulatory Visit: Payer: Self-pay

## 2014-09-11 DIAGNOSIS — D649 Anemia, unspecified: Secondary | ICD-10-CM

## 2014-09-11 NOTE — Progress Notes (Signed)
Quick Note:  Please inform the patient that Hemoccults were negative. Her hemoglobin is stable. She should continue iron. No further GI testing. ______

## 2014-09-16 DIAGNOSIS — D485 Neoplasm of uncertain behavior of skin: Secondary | ICD-10-CM | POA: Diagnosis not present

## 2014-09-16 DIAGNOSIS — Z85828 Personal history of other malignant neoplasm of skin: Secondary | ICD-10-CM | POA: Diagnosis not present

## 2014-09-16 DIAGNOSIS — L98499 Non-pressure chronic ulcer of skin of other sites with unspecified severity: Secondary | ICD-10-CM | POA: Diagnosis not present

## 2014-09-18 DIAGNOSIS — Z803 Family history of malignant neoplasm of breast: Secondary | ICD-10-CM | POA: Diagnosis not present

## 2014-09-18 DIAGNOSIS — Z1231 Encounter for screening mammogram for malignant neoplasm of breast: Secondary | ICD-10-CM | POA: Diagnosis not present

## 2014-09-25 DIAGNOSIS — R921 Mammographic calcification found on diagnostic imaging of breast: Secondary | ICD-10-CM | POA: Diagnosis not present

## 2014-10-01 ENCOUNTER — Other Ambulatory Visit: Payer: Self-pay | Admitting: Radiology

## 2014-10-01 DIAGNOSIS — D0511 Intraductal carcinoma in situ of right breast: Secondary | ICD-10-CM | POA: Diagnosis not present

## 2014-10-01 DIAGNOSIS — R921 Mammographic calcification found on diagnostic imaging of breast: Secondary | ICD-10-CM | POA: Diagnosis not present

## 2014-10-01 DIAGNOSIS — Z0000 Encounter for general adult medical examination without abnormal findings: Secondary | ICD-10-CM | POA: Diagnosis not present

## 2014-10-05 ENCOUNTER — Telehealth: Payer: Self-pay | Admitting: *Deleted

## 2014-10-05 DIAGNOSIS — C50211 Malignant neoplasm of upper-inner quadrant of right female breast: Secondary | ICD-10-CM | POA: Insufficient documentation

## 2014-10-05 NOTE — Telephone Encounter (Signed)
Confirmed [facility] for 10/07/14 at 8am .  Instructions and contact information given. 

## 2014-10-06 DIAGNOSIS — C4401 Basal cell carcinoma of skin of lip: Secondary | ICD-10-CM | POA: Diagnosis not present

## 2014-10-06 DIAGNOSIS — Z85828 Personal history of other malignant neoplasm of skin: Secondary | ICD-10-CM | POA: Diagnosis not present

## 2014-10-06 NOTE — Progress Notes (Signed)
[facility]  [phone] Fax:([phone]) [place] Note   Patient Care Team: Thressa Sheller, MD as PCP - General (Internal Medicine) Thressa Sheller, MD as Consulting Physician (Internal Medicine) Fanny Skates, MD as Consulting Physician (General Surgery) Truitt Merle, MD as Consulting Physician (Hematology) Thea Silversmith, MD as Consulting Physician (Radiation Oncology) Mauro Kaufmann, RN as Registered Nurse Rockwell Germany, RN as Registered Nurse Sylvan Cheese, NP as Nurse Practitioner (Nurse Practitioner) 10/07/2014  CHIEF COMPLAINTS/PURPOSE OF CONSULTATION:  Newly diagnosed right breast DCIS  Oncology History   Breast cancer of upper-inner quadrant of right female breast   Staging form: Breast, AJCC 7th Edition     Clinical: Stage 0 (Tis (DCIS), N0, M0) - Unsigned       Breast cancer of upper-inner quadrant of right female breast   10/01/2014 Initial Diagnosis Breast cancer of upper-inner quadrant of right female breast   10/01/2014 Initial Biopsy Right breast needle core biopsy showed ductal carcinoma in situ with necrosis and calcification.  '  HISTORY OF PRESENTING ILLNESS:  Chelsea [name] 81 y.o. female is here because of newly diagnosed right breast DCIS. She presents to our multidisciplinary breast clinic with her daughter.  This was discovered by screening mammogram. She did not have palpable breast mass, and denies any new symptoms.  She lives with her daughter, works around with a cane. She is not very active, able to do self-care, but not much other activity. She had a stroke 6 years ago, and she has residual double vision. She also has multiple other medical issues, as listed below. She was previously seen at our cancer center by Dr. Ulanda Edison for ITP in 2009, she received prednisone and Rituxan, and has not had recurrence since then.  MEDICAL HISTORY:  Past Medical History  Diagnosis Date  . ITP (idiopathic  thrombocytopenic purpura) 2/09  . Basal cell carcinoma of breast 1998    left  . HTN (hypertension)   . DM (diabetes mellitus)   . Dyslipidemia   . Osteoporosis   . Hyperparathyroidism, primary 07/05/06  . Stroke 02/03/09    mid brain, diplopia  . Hyperlipidemia   . Glaucoma     Bil  . Cataract     SURGICAL HISTORY: Past Surgical History  Procedure Laterality Date  . Tonsillectomy    . Myomectomy N/A 03/14/1995    late 90's per pt  . Cataract extraction      Bil  . Refractive surgery      Bil  . Parathyroidectomy     GYN HISTORY  Menarchal: 15 LMP: in 1970 Contraceptive: no   HRT: no  G1P1:   SOCIAL HISTORY: Social History   Social History  . Marital Status: Divorced    Spouse Name: N/A  . Number of Children: N/A  . Years of Education: N/A   Occupational History  . Not on file.   Social History Main Topics  . Smoking status: Never Smoker   . Smokeless tobacco: Never Used  . Alcohol Use: No  . Drug Use: No  . Sexual Activity: Not on file   Other Topics Concern  . Not on file   Social History Narrative    FAMILY HISTORY: Family History  Problem Relation Age of Onset  . Breast cancer Mother 12  . Colon cancer Father   . Diabetes Father     questionable  . Multiple sclerosis Daughter   . Heart disease Maternal Grandfather     ALLERGIES:  is allergic to travatan z.  MEDICATIONS:  Current Outpatient Prescriptions  Medication Sig Dispense Refill  . aspirin 81 MG tablet Take 81 mg by mouth daily.    . Cholecalciferol (VITAMIN D3) 2000 UNITS TABS Take 2,000 Units by mouth daily.    Marland Kitchen doxycycline (VIBRAMYCIN) 100 MG capsule Take 100 mg by mouth 2 (two) times daily. X 5 days through 10/11/14    . ferrous sulfate (FERROUSUL) 325 (65 FE) MG tablet Take 1 tablet (325 mg total) by mouth daily with breakfast. 30 tablet 3  . metFORMIN (GLUCOPHAGE-XR) 500 MG 24 hr tablet Take 500 mg by mouth 2 (two) times daily.    . Omega 3 1200 MG CAPS Take 1 capsule by  mouth daily.    . pantoprazole (PROTONIX) 40 MG tablet Take 40 mg by mouth as needed.    . valsartan-hydrochlorothiazide (DIOVAN-HCT) 320-25 MG per tablet Take 0.5 tablets by mouth daily.     . calcium carbonate (TUMS - DOSED IN MG ELEMENTAL CALCIUM) 500 MG chewable tablet Chew 1 tablet by mouth as needed for indigestion or heartburn.    . fexofenadine (ALLEGRA) 180 MG tablet Take 180 mg by mouth daily as needed.   3   No current facility-administered medications for this visit.    REVIEW OF SYSTEMS:   Constitutional: Denies fevers, chills or abnormal night sweats Eyes: Denies blurriness of vision, double vision or watery eyes Ears, nose, mouth, throat, and face: Denies mucositis or sore throat Respiratory: Denies cough, dyspnea or wheezes Cardiovascular: Denies palpitation, chest discomfort or lower extremity swelling Gastrointestinal:  Denies nausea, heartburn or change in bowel habits Skin: Denies abnormal skin rashes Lymphatics: Denies new lymphadenopathy or easy bruising Neurological:Denies numbness, tingling or new weaknesses Behavioral/Psych: Mood is stable, no new changes  All other systems were reviewed with the patient and are negative.  PHYSICAL EXAMINATION: ECOG PERFORMANCE STATUS: 3 - Symptomatic, >50% confined to bed  Filed Vitals:   10/07/14 0906  BP: 164/86  Pulse: 87  Temp: 98.3 F (36.8 C)  Resp: 18   Filed Weights   10/07/14 0906  Weight: 194 lb 11.2 oz (88.315 kg)    GENERAL:alert, no distress and comfortable SKIN: skin color, texture, turgor are normal, no rashes or significant lesions EYES: normal, conjunctiva are pink and non-injected, sclera clear OROPHARYNX:no exudate, no erythema and lips, buccal mucosa, and tongue normal  NECK: supple, thyroid normal size, non-tender, without nodularity LYMPH:  no palpable lymphadenopathy in the cervical, axillary or inguinal LUNGS: clear to auscultation and percussion with normal breathing effort HEART: regular  rate & rhythm and no murmurs and no lower extremity edema ABDOMEN:abdomen soft, non-tender and normal bowel sounds Musculoskeletal:no cyanosis of digits and no clubbing  PSYCH: alert & oriented x 3 with fluent speech NEURO: no focal motor/sensory deficits Breasts: Breast inspection showed them to be symmetrical with no nipple discharge. (+) Ecchymosis at the right breast biopsy site. There is a 1.5 cm lump at the biopsy site, likely hematoma from the biopsy. Palpation of the left breast and axilla revealed no obvious mass that I could appreciate.   LABORATORY DATA:  I have reviewed the data as listed Lab Results  Component Value Date   WBC 5.5 10/07/2014   HGB 13.0 10/07/2014   HCT 39.7 10/07/2014   MCV 76.8* 10/07/2014   PLT 207 10/07/2014    Recent Labs  10/07/14 0820  NA 142  K 4.5  CO2 30*  GLUCOSE 119  BUN 11.6  CREATININE 0.8  CALCIUM 9.4  PROT 7.1  ALBUMIN 3.7  AST 20  ALT 19  ALKPHOS 62  BILITOT 0.32    PATHOLOGY REPORT  Diagnosis 10/01/2014 Breast, right, needle core biopsy, calcs - DUCTAL CARCINOMA IN SITU WITH NECROSIS AND CALCIFICATIONS. - SEE MICROSCOPIC DESCRIPTION. Microscopic Comment The findings are consistent with intermediate grade ductal carcinoma in situ. Estrogen and progesterone receptors will be performed.   RADIOGRAPHIC STUDIES: I have personally reviewed the radiological images as listed and agreed with the findings in the report.  Diagnostic mammogram 09/25/2014 The new cluster of heterogeneous calcifications in the right breast is suspicious of malignancy. Biopsies recommended.  ASSESSMENT & PLAN:  81 year old Caucasian female, postmenopausal, with multiple medical comorbidities, presented with screening distracted right breast DCIS.  1. Left breast DCIS, intermediate-grade, ER/PR result pending  -I discussed her scan and breast biopsy results. -The patient had early stage disease. She is considered cured of disease after completing  surgical resection.  -She was seen by Dr. Dalbert Batman today, lumpectomy without sentinel lymph nodes biopsy was discussed with patient and her daughter. Given her advanced age and medical comorbidities, she does have some risk with anesthesia. Patient and her daughter are in favor of having surgery. Any form of adjuvant treatment is for prevention of disease recurrence.  The tumor ER/PR status is still pending today. Given her advanced age and comorbidities, I think the benefit of antiestrogen chemoprevention is limited even if her tumor is ER or PR positive. I do not strongly recommend it. Potential side effects of tamoxifen and anastrozole or discussed with patient and her daughter. If she has wide negative surgical margin, she may not benefit much from adjuvant radiation and also, given her advanced age. She was seen by Dr. Pablo Ledger today.  Plan -She is likely going to proceed with right breast lumpectomy without sentinel lymph node -I'll plan to see her back after surgery, to review her surgical path, and discuss adjuvant antiestrogen therapy again.    All questions were answered. The patient knows to call the clinic with any problems, questions or concerns. I spent 40 minutes counseling the patient face to face. The total time spent in the appointment was 55 minutes and more than 50% was on counseling.     Truitt Merle, MD 10/07/2014 11:23 PM

## 2014-10-07 ENCOUNTER — Encounter: Payer: Self-pay | Admitting: Hematology

## 2014-10-07 ENCOUNTER — Ambulatory Visit: Payer: Medicare Other | Admitting: Physical Therapy

## 2014-10-07 ENCOUNTER — Other Ambulatory Visit: Payer: Self-pay | Admitting: General Surgery

## 2014-10-07 ENCOUNTER — Ambulatory Visit (HOSPITAL_BASED_OUTPATIENT_CLINIC_OR_DEPARTMENT_OTHER): Payer: Medicare Other | Admitting: Hematology

## 2014-10-07 ENCOUNTER — Encounter: Payer: Self-pay | Admitting: Skilled Nursing Facility1

## 2014-10-07 ENCOUNTER — Encounter: Payer: Self-pay | Admitting: Nurse Practitioner

## 2014-10-07 ENCOUNTER — Ambulatory Visit
Admission: RE | Admit: 2014-10-07 | Discharge: 2014-10-07 | Disposition: A | Discharge status: Home / Self Care | Payer: Medicare Other | Source: Ambulatory Visit | Attending: Radiation Oncology | Admitting: Radiation Oncology

## 2014-10-07 ENCOUNTER — Other Ambulatory Visit (HOSPITAL_BASED_OUTPATIENT_CLINIC_OR_DEPARTMENT_OTHER): Payer: Medicare Other

## 2014-10-07 ENCOUNTER — Ambulatory Visit: Payer: Medicare Other

## 2014-10-07 VITALS — BP 164/86 | HR 87 | Temp 98.3°F | Resp 18 | Ht 62.0 in | Wt 194.7 lb

## 2014-10-07 DIAGNOSIS — C50211 Malignant neoplasm of upper-inner quadrant of right female breast: Secondary | ICD-10-CM

## 2014-10-07 DIAGNOSIS — C50911 Malignant neoplasm of unspecified site of right female breast: Secondary | ICD-10-CM

## 2014-10-07 DIAGNOSIS — D539 Nutritional anemia, unspecified: Secondary | ICD-10-CM | POA: Diagnosis not present

## 2014-10-07 DIAGNOSIS — D0511 Intraductal carcinoma in situ of right breast: Secondary | ICD-10-CM | POA: Diagnosis not present

## 2014-10-07 DIAGNOSIS — D0512 Intraductal carcinoma in situ of left breast: Secondary | ICD-10-CM

## 2014-10-07 DIAGNOSIS — D693 Immune thrombocytopenic purpura: Secondary | ICD-10-CM | POA: Diagnosis not present

## 2014-10-07 DIAGNOSIS — E892 Postprocedural hypoparathyroidism: Secondary | ICD-10-CM | POA: Diagnosis not present

## 2014-10-07 DIAGNOSIS — K219 Gastro-esophageal reflux disease without esophagitis: Secondary | ICD-10-CM | POA: Diagnosis not present

## 2014-10-07 DIAGNOSIS — I10 Essential (primary) hypertension: Secondary | ICD-10-CM | POA: Diagnosis not present

## 2014-10-07 DIAGNOSIS — E119 Type 2 diabetes mellitus without complications: Secondary | ICD-10-CM | POA: Diagnosis not present

## 2014-10-07 DIAGNOSIS — Z862 Personal history of diseases of the blood and blood-forming organs and certain disorders involving the immune mechanism: Secondary | ICD-10-CM | POA: Diagnosis not present

## 2014-10-07 DIAGNOSIS — Z8673 Personal history of transient ischemic attack (TIA), and cerebral infarction without residual deficits: Secondary | ICD-10-CM | POA: Diagnosis not present

## 2014-10-07 LAB — COMPREHENSIVE METABOLIC PANEL (CC13)
ALBUMIN: 3.7 g/dL (ref 3.5–5.0)
ALK PHOS: 69 U/L (ref 40–150)
ALT: 19 U/L (ref 0–55)
AST: 20 U/L (ref 5–34)
Anion Gap: 7 mEq/L (ref 3–11)
BUN: 11.6 mg/dL (ref 7.0–26.0)
CO2: 30 mEq/L — ABNORMAL HIGH (ref 22–29)
Calcium: 9.4 mg/dL (ref 8.4–10.4)
Chloride: 105 mEq/L (ref 98–109)
Creatinine: 0.8 mg/dL (ref 0.6–1.1)
EGFR: 65 mL/min/{1.73_m2} — AB (ref >90)
GLUCOSE: 119 mg/dL (ref 70–140)
POTASSIUM: 4.5 meq/L (ref 3.5–5.1)
SODIUM: 142 meq/L (ref 136–145)
Total Bilirubin: 0.32 mg/dL (ref 0.20–1.20)
Total Protein: 7.1 g/dL (ref 6.4–8.3)

## 2014-10-07 LAB — CBC WITH DIFFERENTIAL/PLATELET
BASO%: 1 % (ref 0.0–2.0)
Basophils Absolute: 0.1 10*3/uL (ref 0.0–0.1)
EOS%: 3.4 % (ref 0.0–7.0)
Eosinophils Absolute: 0.2 10*3/uL (ref 0.0–0.5)
HCT: 39.7 % (ref 34.8–46.6)
HEMOGLOBIN: 13 g/dL (ref 11.6–15.9)
LYMPH%: 28.9 % (ref 14.0–49.7)
MCH: 25.1 pg (ref 25.1–34.0)
MCHC: 32.7 g/dL (ref 31.5–36.0)
MCV: 76.8 fL — ABNORMAL LOW (ref 79.5–101.0)
MONO#: 0.6 10*3/uL (ref 0.1–0.9)
MONO%: 10.5 % (ref 0.0–14.0)
NEUT#: 3.1 10*3/uL (ref 1.5–6.5)
NEUT%: 56.2 % (ref 38.4–76.8)
Platelets: 207 10*3/uL (ref 145–400)
RBC: 5.17 10*6/uL (ref 3.70–5.45)
RDW: 21.6 % — ABNORMAL HIGH (ref 11.2–14.5)
WBC: 5.5 10*3/uL (ref 3.9–10.3)
lymph#: 1.6 10*3/uL (ref 0.9–3.3)

## 2014-10-07 NOTE — Progress Notes (Signed)
Subjective:     Patient ID: Chelsea [name], female   DOB: 02-Aug-1928, 81 y.o.   MRN: [id number]  HPI   Review of Systems     Objective:   Physical Exam For the patient to understand and be given the tools to implement a healthy plant based diet during their cancer diagnosis.     Assessment:     Patient was seen today and found to be in good spirits and accompanied by her daughter. Pts ht 5'2'', BMI 35.7, and wt 194 pounds. Pts current/relevant medications: hydrochlorothiazide, metformin, vitamin D, omega 3, ferrous sulfate. Pt does have type 2 diabetes and walks with a cane. Pts Co2 30, GFR 65.      Plan:     Dietitian educated the patient on implementing a plant based diet by incorporating more plant proteins, fruits, and vegetables. As a part of a healthy routine physical activity was discussed. The importance of legitimate, evidence based information was discussed and examples were given. A folder of evidence based information with a focus on a plant based diet and general nutrition during cancer was given to the patient.  As a part of the continuum of care the cancer dietitian's contact information was given to the patient in the event they would like to have a follow up appointment.

## 2014-10-07 NOTE — Progress Notes (Signed)
Checked in new patient br pt. Pt has 2 insurances so may not need my services but has my card if needed.Pt states she received packet in the mail and has it with her.

## 2014-10-07 NOTE — Progress Notes (Addendum)
  Radiation Oncology         [phone]) [phone] ________________________________  Initial Outpatient Consultation - Date: 10/07/2014   Name: Chelsea [name] MRN: [id number]   DOB: [birth year]  REFERRING PHYSICIAN: Fanny Skates, MD  DIAGNOSIS AND STAGE: Breast cancer of upper-inner quadrant of right female breast   Staging form: Breast, AJCC 7th Edition     Clinical: Stage 0 (Tis (DCIS), N0, M0) - Unsigned   HISTORY OF PRESENT ILLNESS:Chelsea [name] is a 81 y.o. female presenting to clinic in regards to her cancer of the right female breast. A needle core biopsy was completed. Ductal carcinoma in SITU with necrosis and calcifications were discovered. Estrogen and progesterone receptors are pending. She reports symptoms of fatigue and currently lives with her daughter.  PREVIOUS RADIATION THERAPY: No  Past medical, social and family history were reviewed in the electronic chart. Review of symptoms was reviewed in the electronic chart. Medications were reviewed in the electronic chart.   OBGYN HISTORY: The patient presented with her first menstrual period at the age of 81 years old. She is currently not experiencing menstural cycles and the approximate date of her last period was stated as the "mid 57's". The patient has carried one child to term and experienced her first live birth at the age of 81 years old. She denies using hormone replacement, birth control pills or hormone shots for contraceptions. In the year of 2010, a pap smear was complete. In the years of 2005 and 2016, the patient completed a colonoscopy. In the years of 2009 and 2013, the patient completed a bone density.  PHYSICAL EXAM:  Vitals 10/07/2014 06/09/2014    BP 164/86 mmHg    Pulse Rate 87    Resp 18    Temp 98.3 F (36.8 C)    Temp Source Oral    SpO2 97 %    Weight 194 lb 11.2 oz (88.315 kg)    Height 5\' 2"  (1.575 m)          IMPRESSION: Chelsea [name] is a 81 year old female presenting to clinic in regards to her  Stage 0 DCIS of the right female breast. Radiation therapy is not recommended at this time.  PLAN: Due to her age and this small area of DCIS, I think she would be fine to proceed with surgery alone. As long as her margins are adequate, she can consider anti-estrogen therapy as well. I am doubtful that radiation will provide any benefit for her. She understands she will need annual mammograms for monitoring. Dr. Burr Medico may consider anti-estrogen therapy. She will meet with Dr. Burr Medico after surgery to discuss this treatment option.   If the patient develops any further questions or concerns, she has been encouraged to contact Dr. Pablo Ledger.  I spent 30 minutes  face to face with the patient and more than 50% of that time was spent in counseling and/or coordination of care.   ------------------------------------------------  Thea Silversmith, MD

## 2014-10-07 NOTE — Progress Notes (Signed)
Ms. Bleiler is a very pleasant 81 y.o. female from [place], [place] with newly diagnosed grade 2 ductal carcinoma in situ of the right breast. Estrogen and progesterone markers are still pending at this time.  She presents today with her daughter to the [facility]) for treatment consideration and recommendations from the breast surgeon, radiation oncologist, and medical oncologist.     I briefly met with Ms. Teti and her daughter during her [facility] visit today. We discussed the purpose of the Survivorship Clinic, which will include monitoring for recurrence, coordinating completion of age and gender-appropriate cancer screenings, promotion of overall wellness, as well as managing potential late/long-term side effects of anti-cancer treatments.    The treatment plan for Ms. Cardon will likely include surgery.   As of today, the intent of treatment for Ms. Schlender is cure, therefore she will be eligible for the Survivorship Clinic upon her completion of treatment.  Her survivorship care plan (SCP) document will be drafted and updated throughout the course of her treatment trajectory. She will receive the SCP in an office visit with myself in the Survivorship Clinic once she has completed treatment.   Ms. Lafoy was encouraged to ask questions and all questions were answered to her satisfaction.  She was given my business card and encouraged to contact me with any concerns regarding survivorship.  I look forward to participating in her care.   [name], [place] [phone]

## 2014-10-13 DIAGNOSIS — Z4802 Encounter for removal of sutures: Secondary | ICD-10-CM | POA: Diagnosis not present

## 2014-10-14 ENCOUNTER — Telehealth: Payer: Self-pay | Admitting: Hematology

## 2014-10-14 ENCOUNTER — Telehealth: Payer: Self-pay | Admitting: *Deleted

## 2014-10-14 ENCOUNTER — Other Ambulatory Visit: Payer: Self-pay | Admitting: *Deleted

## 2014-10-14 NOTE — Telephone Encounter (Signed)
Patient called in to reschedule her appointment °

## 2014-10-14 NOTE — Telephone Encounter (Signed)
lvm for pt regarding to Sept appt.... °

## 2014-10-14 NOTE — Telephone Encounter (Signed)
Left message for a return phone call to follow from [facility] 10/07/14.  Awaiting patient response.

## 2014-10-20 ENCOUNTER — Encounter (HOSPITAL_COMMUNITY): Payer: Self-pay

## 2014-10-20 ENCOUNTER — Other Ambulatory Visit: Payer: Self-pay

## 2014-10-20 ENCOUNTER — Encounter (HOSPITAL_COMMUNITY)
Admission: RE | Admit: 2014-10-20 | Discharge: 2014-10-20 | Disposition: A | Discharge status: Home / Self Care | Payer: Medicare Other | Source: Ambulatory Visit | Attending: General Surgery | Admitting: General Surgery

## 2014-10-20 DIAGNOSIS — M1990 Unspecified osteoarthritis, unspecified site: Secondary | ICD-10-CM | POA: Diagnosis not present

## 2014-10-20 DIAGNOSIS — D0511 Intraductal carcinoma in situ of right breast: Secondary | ICD-10-CM | POA: Diagnosis not present

## 2014-10-20 DIAGNOSIS — I69398 Other sequelae of cerebral infarction: Secondary | ICD-10-CM | POA: Diagnosis not present

## 2014-10-20 DIAGNOSIS — K219 Gastro-esophageal reflux disease without esophagitis: Secondary | ICD-10-CM | POA: Diagnosis not present

## 2014-10-20 DIAGNOSIS — E119 Type 2 diabetes mellitus without complications: Secondary | ICD-10-CM | POA: Diagnosis not present

## 2014-10-20 DIAGNOSIS — Z170 Estrogen receptor positive status [ER+]: Secondary | ICD-10-CM | POA: Diagnosis not present

## 2014-10-20 DIAGNOSIS — D539 Nutritional anemia, unspecified: Secondary | ICD-10-CM | POA: Diagnosis not present

## 2014-10-20 DIAGNOSIS — Z803 Family history of malignant neoplasm of breast: Secondary | ICD-10-CM | POA: Diagnosis not present

## 2014-10-20 DIAGNOSIS — J449 Chronic obstructive pulmonary disease, unspecified: Secondary | ICD-10-CM | POA: Diagnosis not present

## 2014-10-20 DIAGNOSIS — H539 Unspecified visual disturbance: Secondary | ICD-10-CM | POA: Diagnosis not present

## 2014-10-20 DIAGNOSIS — I10 Essential (primary) hypertension: Secondary | ICD-10-CM | POA: Diagnosis not present

## 2014-10-20 HISTORY — DX: Anemia, unspecified: D64.9

## 2014-10-20 HISTORY — DX: Emphysema, unspecified: J43.9

## 2014-10-20 HISTORY — DX: Unspecified osteoarthritis, unspecified site: M19.90

## 2014-10-20 HISTORY — DX: Dermatitis, unspecified: L30.9

## 2014-10-20 HISTORY — DX: Edema, unspecified: R60.9

## 2014-10-20 HISTORY — DX: Dizziness and giddiness: R42

## 2014-10-20 HISTORY — DX: Spontaneous ecchymoses: R23.3

## 2014-10-20 HISTORY — DX: Nocturia: R35.1

## 2014-10-20 HISTORY — DX: Other skin changes: R23.8

## 2014-10-20 HISTORY — DX: Personal history of other diseases of the digestive system: Z87.19

## 2014-10-20 HISTORY — DX: Gastro-esophageal reflux disease without esophagitis: K21.9

## 2014-10-20 HISTORY — DX: Localized edema: R60.0

## 2014-10-20 LAB — COMPREHENSIVE METABOLIC PANEL
ALT: 16 U/L (ref 14–54)
AST: 25 U/L (ref 15–41)
Albumin: 3.6 g/dL (ref 3.5–5.0)
Alkaline Phosphatase: 62 U/L (ref 38–126)
Anion gap: 10 (ref 5–15)
BUN: 11 mg/dL (ref 6–20)
CALCIUM: 9.2 mg/dL (ref 8.9–10.3)
CHLORIDE: 103 mmol/L (ref 101–111)
CO2: 26 mmol/L (ref 22–32)
CREATININE: 0.81 mg/dL (ref 0.44–1.00)
Glucose, Bld: 152 mg/dL — ABNORMAL HIGH (ref 65–99)
Potassium: 4.7 mmol/L (ref 3.5–5.1)
Sodium: 139 mmol/L (ref 135–145)
Total Bilirubin: 0.2 mg/dL — ABNORMAL LOW (ref 0.3–1.2)
Total Protein: 6.9 g/dL (ref 6.5–8.1)

## 2014-10-20 LAB — CBC WITH DIFFERENTIAL/PLATELET
Basophils Absolute: 0 10*3/uL (ref 0.0–0.1)
Basophils Relative: 1 % (ref 0–1)
EOS PCT: 3 % (ref 0–5)
Eosinophils Absolute: 0.2 10*3/uL (ref 0.0–0.7)
HCT: 40.7 % (ref 36.0–46.0)
Hemoglobin: 13.1 g/dL (ref 12.0–15.0)
LYMPHS ABS: 2.1 10*3/uL (ref 0.7–4.0)
LYMPHS PCT: 33 % (ref 12–46)
MCH: 25.7 pg — AB (ref 26.0–34.0)
MCHC: 32.2 g/dL (ref 30.0–36.0)
MCV: 79.8 fL (ref 78.0–100.0)
MONO ABS: 0.5 10*3/uL (ref 0.1–1.0)
MONOS PCT: 8 % (ref 3–12)
Neutro Abs: 3.6 10*3/uL (ref 1.7–7.7)
Neutrophils Relative %: 55 % (ref 43–77)
PLATELETS: 207 10*3/uL (ref 150–400)
RBC: 5.1 MIL/uL (ref 3.87–5.11)
RDW: 18.6 % — ABNORMAL HIGH (ref 11.5–15.5)
WBC: 6.4 10*3/uL (ref 4.0–10.5)

## 2014-10-20 LAB — GLUCOSE, CAPILLARY: Glucose-Capillary: 153 mg/dL — ABNORMAL HIGH (ref 65–99)

## 2014-10-20 MED ORDER — CHLORHEXIDINE GLUCONATE 4 % EX LIQD: 1.0000 "application " | Freq: Once | CUTANEOUS | Status: DC

## 2014-10-20 NOTE — Pre-Procedure Instructions (Signed)
Chelsea [name]  10/20/2014      [id number] - [place], [place] [address] Phone: [phone] Fax: [phone]    Your procedure is scheduled on Fri, Sept 16 @ 7:30 AM  Report to [facility] Admitting at 5:30 AM  Call this number if you have problems the morning of surgery:  [phone]   Remember:  Do not eat food or drink liquids after midnight.  Take these medicines the morning of surgery with A SIP OF WATER Allegra(Fexofenadine) and Pantoprazole(Protonix)              Stop taking your Aspirin and Omega 3. No Goody's,BC's,Aleve,Ibuprofen,Fish Oil,or any Herbal Medications.               How to Manage Your Diabetes Before Surgery   Why is it important to control my blood sugar before and after surgery?   Improving blood sugar levels before and after surgery helps healing and can limit problems.  A way of improving blood sugar control is eating a healthy diet by:  - Eating less sugar and carbohydrates  - Increasing activity/exercise  - Talk with your doctor about reaching your blood sugar goals  High blood sugars (greater than 180 mg/dL) can raise your risk of infections and slow down your recovery so you will need to focus on controlling your diabetes during the weeks before surgery.  Make sure that the doctor who takes care of your diabetes knows about your planned surgery including the date and location.  How do I manage my blood sugars before surgery?   Check your blood sugar at least 4 times a day, 2 days before surgery to make sure that they are not too high or low.   Check your blood sugar the morning of your surgery when you wake up and every 2               hours until you get to the Short-Stay unit.  If your blood sugar is less than 70 mg/dL, you will need to treat for low blood sugar by:  Treat a low blood sugar (less than 70 mg/dL) with 1/2 cup of clear juice (cranberry or  apple), 4 glucose tablets, OR glucose gel.  Recheck blood sugar in 15 minutes after treatment (to make sure it is greater than 70 mg/dL).  If blood sugar is not greater than 70 mg/dL on re-check, call [phone] for further instructions.   Report your blood sugar to the Short-Stay nurse when you get to Short-Stay.  References:  [facility], 2007 "How to Manage your Diabetes Before and After Surgery".  What do I do about my diabetes medications?   Do not take oral diabetes medicines (pills) the morning of surgery.          Do not take other diabetes injectables the day of surgery including Byetta, Victoza, Bydureon, and Trulicity.    If your CBG is greater than 220 mg/dL, you may take 1/2 of your sliding scale (correction) dose of insulin.   For patients with "Insulin Pumps":  Contact your diabetes doctor for specific instructions before surgery.   Decrease basal insulin rates by 20% at midnight the night before surgery.  Note that if your surgery is planned to be longer than 2 hours, your insulin pump will be removed and intravenous (IV) insulin will be started and managed by the nurses and anesthesiologist.  You  will be able to restart your insulin pump once you are awake and able to manage it.  Make sure to bring insulin pump supplies to the hospital with you in case your site needs to be changed.         Do not wear jewelry, make-up or nail polish.  Do not wear lotions, powders, or perfumes.  You may wear deodorant.  Do not shave 48 hours prior to surgery.  Men may shave face and neck.  Do not bring valuables to the hospital.  [facility] is not responsible for any belongings or valuables.  Contacts, dentures or bridgework may not be worn into surgery.  Leave your suitcase in the car.  After surgery it may be brought to your room.  For patients admitted to the hospital, discharge time will be determined by your treatment  team.  Patients discharged the day of surgery will not be allowed to drive home.    Special instructions:  [facility] - Preparing for Surgery  Before surgery, you can play an important role.  Because skin is not sterile, your skin needs to be as free of germs as possible.  You can reduce the number of germs on you skin by washing with CHG (chlorahexidine gluconate) soap before surgery.  CHG is an antiseptic cleaner which kills germs and bonds with the skin to continue killing germs even after washing.  Please DO NOT use if you have an allergy to CHG or antibacterial soaps.  If your skin becomes reddened/irritated stop using the CHG and inform your nurse when you arrive at Short Stay.  Do not shave (including legs and underarms) for at least 48 hours prior to the first CHG shower.  You may shave your face.  Please follow these instructions carefully:   1.  Shower with CHG Soap the night before surgery and the                                morning of Surgery.  2.  If you choose to wash your hair, wash your hair first as usual with your       normal shampoo.  3.  After you shampoo, rinse your hair and body thoroughly to remove the                      Shampoo.  4.  Use CHG as you would any other liquid soap.  You can apply chg directly       to the skin and wash gently with scrungie or a clean washcloth.  5.  Apply the CHG Soap to your body ONLY FROM THE NECK DOWN.        Do not use on open wounds or open sores.  Avoid contact with your eyes,       ears, mouth and genitals (private parts).  Wash genitals (private parts)       with your normal soap.  6.  Wash thoroughly, paying special attention to the area where your surgery        will be performed.  7.  Thoroughly rinse your body with warm water from the neck down.  8.  DO NOT shower/wash with your normal soap after using and rinsing off       the CHG Soap.  9.  Pat yourself dry with a clean towel.  10.  Wear clean pajamas.             11.  Place clean sheets on your bed the night of your first shower and do not        sleep with pets.  Day of Surgery  Do not apply any lotions/deoderants the morning of surgery.  Please wear clean clothes to the hospital/surgery center.    Please read over the following fact sheets that you were given. Pain Booklet, Coughing and Deep Breathing and Surgical Site Infection Prevention

## 2014-10-20 NOTE — Progress Notes (Addendum)
Cardiologist denies having one  Medical Md is Dr.Brian Noah Delaine  Echo report in epic from 2011  Heart cath denies ever having one  Stress test denies ever having one  EKG denies having one in the past yr  CXR denies having one in the past yr

## 2014-10-21 LAB — HEMOGLOBIN A1C
HEMOGLOBIN A1C: 7.2 % — AB (ref 4.8–5.6)
MEAN PLASMA GLUCOSE: 160 mg/dL

## 2014-10-22 DIAGNOSIS — Z0000 Encounter for general adult medical examination without abnormal findings: Secondary | ICD-10-CM | POA: Diagnosis not present

## 2014-10-22 DIAGNOSIS — C50911 Malignant neoplasm of unspecified site of right female breast: Secondary | ICD-10-CM | POA: Diagnosis not present

## 2014-10-22 NOTE — H&P (Signed)
Chelsea [name]  Location: [facility] Patient #: [id number] DOB: Jul 07, 1928 Undefined / Language: Chelsea [name] / Race: White Female       History of Present Illness The patient is a 81 year old female who presents with breast cancer. This is an 81 year old Caucasian female, referred by Dr. 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 She is being seen in the [facility] today by Dr. Pablo Ledger, Dr. Burr Medico, and me. Dr. Dorita Fray is her PCP.  Recent imaging studies reveal a small focal density in the right breast, centrally, and slightly medially, is slightly above the nipple. Image guided biopsy shows ductal carcinoma in situ with necrosis. Estrogen receptor and progesterone receptor studies are pending. stage Tis, NX We discussed her case in breast conference this morning.       Recommendations are partial mastectomy with radioactive seed. Might be able to avoid radiation therapy if we get good margins. She might be offered antiestrogen therapy but might not need that.       I had a long talk with her and her daughter. She is in favor of breast conservation. She will be scheduled for right breast lumpectomy with radioactive seed localization. I discussed the indications, details, techniques, and numerous risk of the surgery with them. She's aware the risk of bleeding, infection, cosmetic deformity, skin necrosis, nerve damage with chronic pain, and other unforeseen prompts. She understands all these issues. All of her questions are answered. She agrees with this plan. Comorbidities and include no prior breast disease. ITP treated by Dr. Ralene Ok and with long-term remission. Recent evaluation for heme positive stools with extensive testing by Dr. Deatra Ina, negative. Hypertension. Diabetes. History parathyroidectomy by Dr. Harlow Asa 2008. Stroke 2010 with some visual changes residual.  GERD. Recent basal cell cancer removed from left face. Family history reveals her mother was diagnosed with breast cancer at age 45 and died at age 69 from recurrent disease. No ovarian cancer. The patient lives with her daughter, her daughter's husband, and her daughter's 32 year old son. She and relates with a cane.  Addendum Note ER/PR both positive.    Medication History Conni Slipper, RN; 10/07/2014 10:46 AM) Medications Reconciled  Physical Exam  General Mental Status-Alert. General Appearance-Consistent with stated age. Hydration-Well hydrated. Voice-Normal. Note: Pleasant. Alert. Oriented. Mental status normal. Ambulate with a cane.   Head and Neck Head-normocephalic, atraumatic with no lesions or palpable masses. Trachea-midline. Thyroid Gland Characteristics - normal size and consistency. Note: Bandage left face at nasolabial fold from recent skin biopsy.   Eye Eyeball - Bilateral-Extraocular movements intact. Sclera/Conjunctiva - Bilateral-No scleral icterus.  Chest and Lung Exam Chest and lung exam reveals -quiet, even and easy respiratory effort with no use of accessory muscles and on auscultation, normal breath sounds, no adventitious sounds and normal vocal resonance. Inspection Chest Wall - Normal. Back - normal.  Breast Note: No axillary adenopathy in either axilla. Small bruise right breast. No hematoma. No mass in either breast   Cardiovascular Cardiovascular examination reveals -normal heart sounds, regular rate and rhythm with no murmurs and normal pedal pulses bilaterally.  Abdomen Inspection Inspection of the abdomen reveals - No Hernias. Skin - Scar - no surgical scars. Palpation/Percussion Palpation and Percussion of the abdomen reveal - Soft, Non Tender, No Rebound tenderness, No Rigidity (guarding) and No hepatosplenomegaly. Auscultation Auscultation of the abdomen reveals - Bowel sounds  normal.  Neurologic Neurologic evaluation reveals -alert and oriented x 3 with no  impairment of recent or remote memory. Mental Status-Normal.  Musculoskeletal Normal Exam - Left-Upper Extremity Strength Normal and Lower Extremity Strength Normal. Normal Exam - Right-Upper Extremity Strength Normal and Lower Extremity Strength Normal.  Lymphatic Head & Neck  General Head & Neck Lymphatics: Bilateral - Description - Normal. Axillary  General Axillary Region: Bilateral - Description - Normal. Tenderness - Non Tender. Femoral & Inguinal  Generalized Femoral & Inguinal Lymphatics: Bilateral - Description - Normal. Tenderness - Non Tender.    Assessment & Plan  DUCTAL CARCINOMA IN SITU OF RIGHT BREAST (233.0  D05.11) Current Plans  You are being scheduled for surgery - Our schedulers will call you.  Your recent imaging studies and biopsies show an area of ductal carcinoma in situ in the right breast, centrally and slightly medially. This is a small area that can be removed by a lumpectomy, and you do not need a mastectomy. You have agreed to proceed with a right breast lumpectomy with radioactive seed localization. We have discussed the indications, techniques, and numerous risk of this surgery in detail You will not need any lymph node biopsy Please read the printed information that I have given you     You should hear from our office's scheduling department within 5 working days about the location, date, and time of surgery. We try to make accommodations for patient's preferences in scheduling surgery, but sometimes the OR schedule or the surgeon's schedule prevents [place] from making those accommodations.  If you have not heard from our office [phone]) in 5 working days, call the office and ask for your surgeon's nurse.  If you have other questions about your diagnosis, plan, or surgery, call the office and ask for your surgeon's nurse. Pt Education - [facility] Breast  Cancer Information Given - [facility] "Breast Journey" Package Pt Education - Lumpectomy and Axillary Lymph Node Dissection: cancer    HISTORY OF CEREBROVASCULAR ACCIDENT (V12.54  Z86.73) Impression: Visual changes persist BENIGN HYPERTENSION (401.1  I10) CHRONIC GERD (530.81  K21.9) ANEMIA, DEFICIENCY (281.9  D53.9) Impression: Dr. Deatra Ina has performed extensive GI workup which is negative HISTORY OF PARATHYROIDECTOMY (V15.29  E89.2) Impression: Primary hyperparathyroidism. Dr. Harlow Asa. 2008. HISTORY OF ITP (V12.3  Z86.2) Impression: Treated by [place] in remission. CONTROLLED TYPE 2 DIABETES MELLITUS WITHOUT COMPLICATION, WITHOUT LONG-TERM CURRENT USE OF INSULIN (250.00  E11.9) Addendum Note(Ramyah Pankowski M. Dalbert Batman MD; 10/12/2014 4:43 PM) ER/PR both positive(+).    Edsel Petrin. Dalbert Batman, M.D., [facility] General and Minimally invasive Surgery Breast and Colorectal Surgery Office:   [phone] Pager:   [phone]

## 2014-10-23 ENCOUNTER — Encounter (HOSPITAL_COMMUNITY): Payer: Self-pay | Admitting: *Deleted

## 2014-10-23 ENCOUNTER — Encounter (HOSPITAL_COMMUNITY)
Admission: RE | Disposition: A | Discharge status: Home / Self Care | Payer: Self-pay | Source: Ambulatory Visit | Attending: General Surgery

## 2014-10-23 ENCOUNTER — Ambulatory Visit (HOSPITAL_COMMUNITY)
Admission: RE | Admit: 2014-10-23 | Discharge: 2014-10-23 | Disposition: A | Discharge status: Home / Self Care | Payer: Medicare Other | Source: Ambulatory Visit | Attending: General Surgery | Admitting: General Surgery

## 2014-10-23 ENCOUNTER — Ambulatory Visit (HOSPITAL_COMMUNITY): Payer: Medicare Other | Admitting: Anesthesiology

## 2014-10-23 DIAGNOSIS — C50211 Malignant neoplasm of upper-inner quadrant of right female breast: Secondary | ICD-10-CM | POA: Diagnosis present

## 2014-10-23 DIAGNOSIS — J449 Chronic obstructive pulmonary disease, unspecified: Secondary | ICD-10-CM | POA: Insufficient documentation

## 2014-10-23 DIAGNOSIS — D539 Nutritional anemia, unspecified: Secondary | ICD-10-CM | POA: Insufficient documentation

## 2014-10-23 DIAGNOSIS — D0511 Intraductal carcinoma in situ of right breast: Secondary | ICD-10-CM | POA: Diagnosis not present

## 2014-10-23 DIAGNOSIS — Z0000 Encounter for general adult medical examination without abnormal findings: Secondary | ICD-10-CM | POA: Diagnosis not present

## 2014-10-23 DIAGNOSIS — Z170 Estrogen receptor positive status [ER+]: Secondary | ICD-10-CM | POA: Insufficient documentation

## 2014-10-23 DIAGNOSIS — I69398 Other sequelae of cerebral infarction: Secondary | ICD-10-CM | POA: Insufficient documentation

## 2014-10-23 DIAGNOSIS — I10 Essential (primary) hypertension: Secondary | ICD-10-CM | POA: Diagnosis not present

## 2014-10-23 DIAGNOSIS — M1990 Unspecified osteoarthritis, unspecified site: Secondary | ICD-10-CM | POA: Diagnosis not present

## 2014-10-23 DIAGNOSIS — Z803 Family history of malignant neoplasm of breast: Secondary | ICD-10-CM | POA: Diagnosis not present

## 2014-10-23 DIAGNOSIS — E119 Type 2 diabetes mellitus without complications: Secondary | ICD-10-CM | POA: Insufficient documentation

## 2014-10-23 DIAGNOSIS — K219 Gastro-esophageal reflux disease without esophagitis: Secondary | ICD-10-CM | POA: Insufficient documentation

## 2014-10-23 DIAGNOSIS — D649 Anemia, unspecified: Secondary | ICD-10-CM | POA: Diagnosis not present

## 2014-10-23 DIAGNOSIS — D0581 Other specified type of carcinoma in situ of right breast: Secondary | ICD-10-CM | POA: Diagnosis not present

## 2014-10-23 DIAGNOSIS — H539 Unspecified visual disturbance: Secondary | ICD-10-CM | POA: Insufficient documentation

## 2014-10-23 DIAGNOSIS — R921 Mammographic calcification found on diagnostic imaging of breast: Secondary | ICD-10-CM | POA: Diagnosis not present

## 2014-10-23 HISTORY — PX: BREAST LUMPECTOMY WITH RADIOACTIVE SEED LOCALIZATION: SHX6424

## 2014-10-23 LAB — GLUCOSE, CAPILLARY
GLUCOSE-CAPILLARY: 114 mg/dL — AB (ref 65–99)
Glucose-Capillary: 124 mg/dL — ABNORMAL HIGH (ref 65–99)

## 2014-10-23 SURGERY — BREAST LUMPECTOMY WITH RADIOACTIVE SEED LOCALIZATION
Anesthesia: General | Site: Breast | Laterality: Right

## 2014-10-23 MED ORDER — SODIUM CHLORIDE 0.9 % IJ SOLN
INTRAMUSCULAR | Status: AC
  Filled 2014-10-23: qty 10

## 2014-10-23 MED ORDER — EPHEDRINE SULFATE 50 MG/ML IJ SOLN
INTRAMUSCULAR | Status: AC
  Filled 2014-10-23: qty 1

## 2014-10-23 MED ORDER — BUPIVACAINE-EPINEPHRINE (PF) 0.25% -1:200000 IJ SOLN
INTRAMUSCULAR | Status: AC
  Filled 2014-10-23: qty 30

## 2014-10-23 MED ORDER — OXYCODONE HCL 5 MG PO TABS
5.0000 mg | ORAL_TABLET | Freq: Once | ORAL | Status: AC | PRN
  Administered 2014-10-23: 5 mg via ORAL

## 2014-10-23 MED ORDER — ONDANSETRON HCL 4 MG/2ML IJ SOLN
INTRAMUSCULAR | Status: DC | PRN
  Administered 2014-10-23: 4 mg via INTRAVENOUS

## 2014-10-23 MED ORDER — SUCCINYLCHOLINE CHLORIDE 20 MG/ML IJ SOLN
INTRAMUSCULAR | Status: AC
  Filled 2014-10-23: qty 1

## 2014-10-23 MED ORDER — SODIUM CHLORIDE 0.9 % IJ SOLN: 3.0000 mL | INTRAMUSCULAR | Status: DC | PRN

## 2014-10-23 MED ORDER — MIDAZOLAM HCL 2 MG/2ML IJ SOLN
INTRAMUSCULAR | Status: AC
  Filled 2014-10-23: qty 2

## 2014-10-23 MED ORDER — ACETAMINOPHEN 325 MG PO TABS
ORAL_TABLET | ORAL | Status: AC
  Administered 2014-10-23: 650 mg via ORAL
  Filled 2014-10-23: qty 2

## 2014-10-23 MED ORDER — FENTANYL CITRATE (PF) 100 MCG/2ML IJ SOLN
INTRAMUSCULAR | Status: AC
  Administered 2014-10-23: 50 ug via INTRAVENOUS
  Filled 2014-10-23: qty 2

## 2014-10-23 MED ORDER — 0.9 % SODIUM CHLORIDE (POUR BTL) OPTIME
TOPICAL | Status: DC | PRN
  Administered 2014-10-23: 1000 mL

## 2014-10-23 MED ORDER — SODIUM CHLORIDE 0.9 % IV SOLN: 250.0000 mL | INTRAVENOUS | Status: DC | PRN

## 2014-10-23 MED ORDER — ONDANSETRON HCL 4 MG/2ML IJ SOLN
INTRAMUSCULAR | Status: AC
  Filled 2014-10-23: qty 2

## 2014-10-23 MED ORDER — OXYCODONE HCL 5 MG PO TABS
ORAL_TABLET | ORAL | Status: AC
  Filled 2014-10-23: qty 1

## 2014-10-23 MED ORDER — FENTANYL CITRATE (PF) 100 MCG/2ML IJ SOLN
25.0000 ug | INTRAMUSCULAR | Status: DC | PRN
  Administered 2014-10-23: 50 ug via INTRAVENOUS

## 2014-10-23 MED ORDER — OXYCODONE HCL 5 MG/5ML PO SOLN: 5.0000 mg | Freq: Once | ORAL | Status: AC | PRN

## 2014-10-23 MED ORDER — CEFAZOLIN SODIUM-DEXTROSE 2-3 GM-% IV SOLR
2.0000 g | INTRAVENOUS | Status: AC
  Administered 2014-10-23: 2 g via INTRAVENOUS

## 2014-10-23 MED ORDER — BUPIVACAINE-EPINEPHRINE 0.25% -1:200000 IJ SOLN
INTRAMUSCULAR | Status: DC | PRN
  Administered 2014-10-23: 15 mL

## 2014-10-23 MED ORDER — ACETAMINOPHEN 650 MG RE SUPP
650.0000 mg | RECTAL | Status: DC | PRN
  Filled 2014-10-23: qty 1

## 2014-10-23 MED ORDER — ACETAMINOPHEN 325 MG PO TABS
650.0000 mg | ORAL_TABLET | ORAL | Status: DC | PRN
  Administered 2014-10-23: 650 mg via ORAL
  Filled 2014-10-23 (×2): qty 2

## 2014-10-23 MED ORDER — LACTATED RINGERS IV SOLN
INTRAVENOUS | Status: DC | PRN
  Administered 2014-10-23: 07:00:00 via INTRAVENOUS

## 2014-10-23 MED ORDER — SODIUM CHLORIDE 0.9 % IV SOLN: INTRAVENOUS | Status: DC

## 2014-10-23 MED ORDER — CEFAZOLIN SODIUM-DEXTROSE 2-3 GM-% IV SOLR
INTRAVENOUS | Status: AC
  Filled 2014-10-23: qty 50

## 2014-10-23 MED ORDER — FENTANYL CITRATE (PF) 250 MCG/5ML IJ SOLN
INTRAMUSCULAR | Status: AC
  Filled 2014-10-23: qty 5

## 2014-10-23 MED ORDER — FENTANYL CITRATE (PF) 100 MCG/2ML IJ SOLN
INTRAMUSCULAR | Status: DC | PRN
  Administered 2014-10-23 (×2): 50 ug via INTRAVENOUS

## 2014-10-23 MED ORDER — HYDROCODONE-ACETAMINOPHEN 5-325 MG PO TABS: 1.0000 | ORAL_TABLET | Freq: Four times a day (QID) | ORAL | Status: DC | PRN

## 2014-10-23 MED ORDER — PROPOFOL 10 MG/ML IV BOLUS
INTRAVENOUS | Status: AC
  Filled 2014-10-23: qty 20

## 2014-10-23 MED ORDER — OXYCODONE HCL 5 MG PO TABS
ORAL_TABLET | ORAL | Status: AC
  Administered 2014-10-23: 5 mg via ORAL
  Filled 2014-10-23: qty 1

## 2014-10-23 MED ORDER — OXYCODONE HCL 5 MG PO TABS
5.0000 mg | ORAL_TABLET | ORAL | Status: DC | PRN
  Administered 2014-10-23: 5 mg via ORAL
  Filled 2014-10-23: qty 2

## 2014-10-23 MED ORDER — PHENYLEPHRINE 40 MCG/ML (10ML) SYRINGE FOR IV PUSH (FOR BLOOD PRESSURE SUPPORT)
PREFILLED_SYRINGE | INTRAVENOUS | Status: AC
  Filled 2014-10-23: qty 10

## 2014-10-23 MED ORDER — ROCURONIUM BROMIDE 50 MG/5ML IV SOLN
INTRAVENOUS | Status: AC
  Filled 2014-10-23: qty 1

## 2014-10-23 MED ORDER — LIDOCAINE HCL (CARDIAC) 20 MG/ML IV SOLN
INTRAVENOUS | Status: DC | PRN
  Administered 2014-10-23: 60 mg via INTRAVENOUS

## 2014-10-23 MED ORDER — SODIUM CHLORIDE 0.9 % IJ SOLN: 3.0000 mL | Freq: Two times a day (BID) | INTRAMUSCULAR | Status: DC

## 2014-10-23 MED ORDER — ONDANSETRON HCL 4 MG/2ML IJ SOLN: 4.0000 mg | Freq: Four times a day (QID) | INTRAMUSCULAR | Status: DC | PRN

## 2014-10-23 MED ORDER — PROPOFOL 10 MG/ML IV BOLUS
INTRAVENOUS | Status: DC | PRN
  Administered 2014-10-23: 120 mg via INTRAVENOUS

## 2014-10-23 SURGICAL SUPPLY — 47 items
APPLIER CLIP 9.375 MED OPEN (MISCELLANEOUS) ×3
BINDER BREAST LRG (GAUZE/BANDAGES/DRESSINGS) IMPLANT
BINDER BREAST XLRG (GAUZE/BANDAGES/DRESSINGS) ×3 IMPLANT
BLADE SURG 15 STRL LF DISP TIS (BLADE) ×1 IMPLANT
BLADE SURG 15 STRL SS (BLADE) ×2
CANISTER SUCTION 2500CC (MISCELLANEOUS) ×3 IMPLANT
CHLORAPREP W/TINT 26ML (MISCELLANEOUS) ×3 IMPLANT
CLIP APPLIE 9.375 MED OPEN (MISCELLANEOUS) ×1 IMPLANT
COVER PROBE W GEL 5X96 (DRAPES) ×3 IMPLANT
COVER SURGICAL LIGHT HANDLE (MISCELLANEOUS) ×3 IMPLANT
DERMABOND ADVANCED (GAUZE/BANDAGES/DRESSINGS) ×2
DERMABOND ADVANCED .7 DNX12 (GAUZE/BANDAGES/DRESSINGS) ×1 IMPLANT
DEVICE DUBIN SPECIMEN MAMMOGRA (MISCELLANEOUS) ×3 IMPLANT
DRAPE CHEST BREAST 15X10 FENES (DRAPES) ×3 IMPLANT
DRAPE UTILITY W/TAPE 26X15 (DRAPES) ×3 IMPLANT
ELECT CAUTERY BLADE 6.4 (BLADE) ×3 IMPLANT
ELECT REM PT RETURN 9FT ADLT (ELECTROSURGICAL) ×3
ELECTRODE REM PT RTRN 9FT ADLT (ELECTROSURGICAL) ×1 IMPLANT
GAUZE SPONGE 4X4 12PLY STRL (GAUZE/BANDAGES/DRESSINGS) ×3 IMPLANT
GLOVE BIOGEL PI IND STRL 7.0 (GLOVE) ×1 IMPLANT
GLOVE BIOGEL PI INDICATOR 7.0 (GLOVE) ×2
GLOVE EUDERMIC 7 POWDERFREE (GLOVE) ×6 IMPLANT
GLOVE SURG SS PI 7.0 STRL IVOR (GLOVE) ×3 IMPLANT
GOWN STRL REUS W/ TWL LRG LVL3 (GOWN DISPOSABLE) ×1 IMPLANT
GOWN STRL REUS W/ TWL XL LVL3 (GOWN DISPOSABLE) ×1 IMPLANT
GOWN STRL REUS W/TWL LRG LVL3 (GOWN DISPOSABLE) ×2
GOWN STRL REUS W/TWL XL LVL3 (GOWN DISPOSABLE) ×2
KIT BASIN OR (CUSTOM PROCEDURE TRAY) ×3 IMPLANT
KIT MARKER MARGIN INK (KITS) ×3 IMPLANT
LIQUID BAND (GAUZE/BANDAGES/DRESSINGS) ×3 IMPLANT
NEEDLE HYPO 25X1 1.5 SAFETY (NEEDLE) ×3 IMPLANT
NS IRRIG 1000ML POUR BTL (IV SOLUTION) ×3 IMPLANT
PACK SURGICAL SETUP 50X90 (CUSTOM PROCEDURE TRAY) ×3 IMPLANT
PAD ABD 8X10 STRL (GAUZE/BANDAGES/DRESSINGS) ×3 IMPLANT
PENCIL BUTTON HOLSTER BLD 10FT (ELECTRODE) ×3 IMPLANT
SPONGE LAP 18X18 X RAY DECT (DISPOSABLE) ×3 IMPLANT
SUT MNCRL AB 4-0 PS2 18 (SUTURE) ×3 IMPLANT
SUT SILK 2 0 SH (SUTURE) ×3 IMPLANT
SUT VIC AB 2-0 CT1 36 (SUTURE) ×3 IMPLANT
SUT VIC AB 3-0 SH 18 (SUTURE) ×3 IMPLANT
SYR BULB 3OZ (MISCELLANEOUS) ×3 IMPLANT
SYR CONTROL 10ML LL (SYRINGE) ×3 IMPLANT
TOWEL OR 17X24 6PK STRL BLUE (TOWEL DISPOSABLE) ×3 IMPLANT
TOWEL OR 17X26 10 PK STRL BLUE (TOWEL DISPOSABLE) ×3 IMPLANT
TUBE CONNECTING 12'X1/4 (SUCTIONS) ×1
TUBE CONNECTING 12X1/4 (SUCTIONS) ×2 IMPLANT
YANKAUER SUCT BULB TIP NO VENT (SUCTIONS) ×3 IMPLANT

## 2014-10-23 NOTE — Interval H&P Note (Signed)
History and Physical Interval Note:  10/23/2014 6:58 AM  Chelsea [name]  has presented today for surgery, with the diagnosis of ductal carcinoma in situ right breast   The various methods of treatment have been discussed with the patient and family. After consideration of risks, benefits and other options for treatment, the patient has consented to  Procedure(s): RIGHT BREAST LUMPECTOMY WITH RADIOACTIVE SEED LOCALIZATION (Right) as a surgical intervention .  The patient's history has been reviewed, patient examined, no change in status, stable for surgery.  I have reviewed the patient's chart and labs.  Questions were answered to the patient's satisfaction.     [name]

## 2014-10-23 NOTE — Anesthesia Postprocedure Evaluation (Signed)
  Anesthesia Post-op Note  Patient: Chelsea [name]  Procedure(s) Performed: Procedure(s): RIGHT BREAST LUMPECTOMY WITH RADIOACTIVE SEED LOCALIZATION (Right)  Patient Location: PACU  Anesthesia Type:General  Level of Consciousness: awake  Airway and Oxygen Therapy: Patient Spontanous Breathing  Post-op Pain: mild  Post-op Assessment: Post-op Vital signs reviewed, Patient's Cardiovascular Status Stable, Respiratory Function Stable, No signs of Nausea or vomiting and Pain level controlled              Post-op Vital Signs: Reviewed and stable  Last Vitals:  Filed Vitals:   10/23/14 1008  BP: 130/60  Pulse: 66  Temp:   Resp:     Complications: No apparent anesthesia complications

## 2014-10-23 NOTE — Discharge Instructions (Signed)
[facility] °Office Phone Number [phone] ° °BREAST BIOPSY/ PARTIAL MASTECTOMY: POST OP INSTRUCTIONS ° °Always review your discharge instruction sheet given to you by the facility where your surgery was performed. ° °IF YOU HAVE DISABILITY OR FAMILY LEAVE FORMS, YOU MUST BRING THEM TO THE OFFICE FOR PROCESSING.  DO NOT GIVE THEM TO YOUR DOCTOR. ° °1. A prescription for pain medication may be given to you upon discharge.  Take your pain medication as prescribed, if needed.  If narcotic pain medicine is not needed, then you may take acetaminophen (Tylenol) or ibuprofen (Advil) as needed. °2. Take your usually prescribed medications unless otherwise directed °3. If you need a refill on your pain medication, please contact your pharmacy.  They will contact our office to request authorization.  Prescriptions will not be filled after 5pm or on week-ends. °4. You should eat very light the first 24 hours after surgery, such as soup, crackers, pudding, etc.  Resume your normal diet the day after surgery. °5. Most patients will experience some swelling and bruising in the breast.  Ice packs and a good support bra will help.  Swelling and bruising can take several days to resolve.  °6. It is common to experience some constipation if taking pain medication after surgery.  Increasing fluid intake and taking a stool softener will usually help or prevent this problem from occurring.  A mild laxative (Milk of Magnesia or Miralax) should be taken according to package directions if there are no bowel movements after 48 hours. °7. Unless discharge instructions indicate otherwise, you may remove your bandages 24-48 hours after surgery, and you may shower at that time.  You may have steri-strips (small skin tapes) in place directly over the incision.  These strips should be left on the skin for 7-10 days.  If your surgeon used skin glue on the incision, you may shower in 24 hours.  The glue will flake off over the  next 2-3 weeks.  Any sutures or staples will be removed at the office during your follow-up visit. °8. ACTIVITIES:  You may resume regular daily activities (gradually increasing) beginning the next day.  Wearing a good support bra or sports bra minimizes pain and swelling.  You may have sexual intercourse when it is comfortable. °a. You may drive when you no longer are taking prescription pain medication, you can comfortably wear a seatbelt, and you can safely maneuver your car and apply brakes. °b. RETURN TO WORK:  ______________________________________________________________________________________ °9. You should see your doctor in the office for a follow-up appointment approximately two weeks after your surgery.  Your doctor’s nurse will typically make your follow-up appointment when she calls you with your pathology report.  Expect your pathology report 2-3 business days after your surgery.  You may call to check if you do not hear from us after three days. °10. OTHER INSTRUCTIONS: _______________________________________________________________________________________________ _____________________________________________________________________________________________________________________________________ °_____________________________________________________________________________________________________________________________________ °_____________________________________________________________________________________________________________________________________ ° °WHEN TO CALL YOUR DOCTOR: °1. Fever over 101.0 °2. Nausea and/or vomiting. °3. Extreme swelling or bruising. °4. Continued bleeding from incision. °5. Increased pain, redness, or drainage from the incision. ° °The clinic staff is available to answer your questions during regular business hours.  Please don’t hesitate to call and ask to speak to one of the nurses for clinical concerns.  If you have a medical emergency, go to the nearest  emergency room or call 911.  A surgeon from [facility] is always on call at the hospital. ° °For further questions, please visit [email or web address]  °

## 2014-10-23 NOTE — Anesthesia Preprocedure Evaluation (Addendum)
Anesthesia Evaluation  Patient identified by MRN, date of birth, ID band Patient awake    Reviewed: Allergy & Precautions, NPO status , Patient's Chart, lab work & pertinent test results  Airway Mallampati: II   Neck ROM: full    Dental   Pulmonary COPD,    breath sounds clear to auscultation       Cardiovascular hypertension,  Rhythm:regular Rate:Normal     Neuro/Psych CVA    GI/Hepatic hiatal hernia, GERD  ,  Endo/Other  diabetes, Type 2  Renal/GU      Musculoskeletal  (+) Arthritis ,   Abdominal   Peds  Hematology   Anesthesia Other Findings   Reproductive/Obstetrics                            Anesthesia Physical Anesthesia Plan  ASA: III  Anesthesia Plan: General   Post-op Pain Management:    Induction: Intravenous  Airway Management Planned: LMA  Additional Equipment:   Intra-op Plan:   Post-operative Plan:   Informed Consent: I have reviewed the patients History and Physical, chart, labs and discussed the procedure including the risks, benefits and alternatives for the proposed anesthesia with the patient or authorized representative who has indicated his/her understanding and acceptance.     Plan Discussed with: CRNA, Anesthesiologist and Surgeon  Anesthesia Plan Comments:         Anesthesia Quick Evaluation

## 2014-10-23 NOTE — Anesthesia Procedure Notes (Signed)
Procedure Name: LMA Insertion Date/Time: 10/23/2014 7:21 AM Performed by: Izora Gala Pre-anesthesia Checklist: Patient identified, Emergency Drugs available, Suction available and Patient being monitored Patient Re-evaluated:Patient Re-evaluated prior to inductionOxygen Delivery Method: Circle system utilized Preoxygenation: Pre-oxygenation with 100% oxygen Intubation Type: IV induction Ventilation: Mask ventilation without difficulty LMA: LMA inserted LMA Size: 4.0 Number of attempts: 1 Placement Confirmation: ETT inserted through vocal cords under direct vision,  positive ETCO2 and breath sounds checked- equal and bilateral Tube secured with: Tape Dental Injury: Teeth and Oropharynx as per pre-operative assessment

## 2014-10-23 NOTE — Op Note (Signed)
Patient Name:           Chelsea [name]   Date of Surgery:        10/23/2014  Pre op Diagnosis:      Ductal carcinoma in situ right breast, estrogen receptor positive, progesterone receptor positive, upper inner quadrant  Post op Diagnosis:    Same  Procedure:                 Right partial mastectomy with radioactive seed localization  Surgeon:                     Edsel Petrin. Dalbert Batman, M.D., FACS  Assistant:                      OR staff  Operative Indications:    This is an 81 year old Caucasian female, referred by Dr. Joanell Rising at [facility] mammography for evaluation of recently diagnosed ductal carcinoma in situ right breast, central and slightly medial. She was seen in the [facility] recently by Dr. Pablo Ledger, Dr. Burr Medico, and me. Dr. Dorita Fray is her PCP.  Recent imaging studies reveal a small focal density in the right breast, centrally, and  medially, and slightly above the nipple. Image guided biopsy shows ductal carcinoma in situ with necrosis. Estrogen receptor and progesterone receptor studies are positive. stage Tis, NX We discussed her case in breast conference this morning.   Recommendations are partial mastectomy with radioactive seed. Might be able to avoid radiation therapy if we get good margins. She might be offered antiestrogen therapy.  I had a long talk with her and her daughter. She is in favor of breast conservation. She will be scheduled for right breast lumpectomy with radioactive seed localization.  Family history reveals her mother was diagnosed with breast cancer at age 63 and died at age 62 from recurrent disease. No ovarian cancer. The patient lives with her daughter, her daughter's husband, and her daughter's 101 year old son. She ambulates with a cane.  Operative Findings:       The case was delayed for a short while because the imaging films were not delivered by the courier, and we had to get [facility] mammography to copy them and send another  copy over.  I had a conversation with Dr. Marcelo Baldy who was very helpful.  The biopsy marker clip is present in the upper inner quadrant.  The radioactive seed had migrated about 1.5 cm medially.  This required [place] to resect not only the seed but to go much further medially to be sure that we got the cancer, in hopes of having a negative margin and getting a good margin, hopefully to avoid radiation therapy.  The specimen mammogram looked good with both the marker clip and the seed within the specimen and a fair amount of tissue medially  Procedure in Detail:          Following the induction of general LMA anesthesia the patient's right breast was prepped and draped in a sterile fashion.  Surgical timeout was performed.  Intravenous antibiotics were given.  0.5% Marcaine with epinephrine was used as local infiltration anesthetic.  Using the neoprobe I could hear the radioactive signal and I marked that area on the breast.  The incision was planned with a marking pen to be sure to encompass a fair amount of tissue medial to the radioactive seed, as described above.  The incision was made.  The breast tissue was dissected around the seed and the  marker clip,  and significant area medially as described above.  The neoprobe was used frequently to guide the dissection.  The specimen was removed and marked with silk sutures and the 6 color ink kit to orient the pathologist.  Specimen mammogram looked good as described above.  The radioactive signal was within the specimen.  Specimen was sent to the lab.      After irrigating the wound and ensuring good hemostasis the lumpectomy cavity was marked with metal clips.  The breast tissues were closed in layers with interrupted 2-0 and 3-0 Vicryl.  The skin was closed with a running subcuticular 4-0 Monocryl and Dermabond.  After the Dermabond dried we placed clean bandages and a breast binder.  The patient tolerated the procedure well was taken to PACU in stable condition.   EBL 15 mL.  Counts correct.  Complications none.        Edsel Petrin. Dalbert Batman, M.D., FACS General and Minimally Invasive Surgery Breast and Colorectal Surgery  10/23/2014 8:48 AM

## 2014-10-23 NOTE — Transfer of Care (Signed)
Immediate Anesthesia Transfer of Care Note  Patient: Chelsea [name]  Procedure(s) Performed: Procedure(s): RIGHT BREAST LUMPECTOMY WITH RADIOACTIVE SEED LOCALIZATION (Right)  Patient Location: PACU  Anesthesia Type:General  Level of Consciousness: awake, alert  and oriented  Airway & Oxygen Therapy: Patient Spontanous Breathing and Patient connected to face mask oxygen  Post-op Assessment: Report given to RN, Post -op Vital signs reviewed and stable and Patient moving all extremities X 4  Post vital signs: Reviewed and stable  Last Vitals:  Filed Vitals:   10/23/14 0604  BP: 157/80  Pulse: 76  Temp: 36.8 C  Resp: 18    Complications: No apparent anesthesia complications

## 2014-10-26 ENCOUNTER — Encounter (HOSPITAL_COMMUNITY): Payer: Self-pay | Admitting: General Surgery

## 2014-10-26 NOTE — Progress Notes (Signed)
Quick Note:  Call pathology report to patient. Tell her that she had a 1 cm area of ductal carcinoma in situ. Margins are widely negative. This is good news. She will not need any further surgery. Make sure she has follow-up appointments with medical oncology and radiation oncology. let know that you have been able to discuss this with her..  hmi ______

## 2014-10-28 ENCOUNTER — Telehealth: Payer: Self-pay | Admitting: Gastroenterology

## 2014-10-28 NOTE — Telephone Encounter (Signed)
Order is in.

## 2014-10-30 ENCOUNTER — Telehealth: Payer: Self-pay | Admitting: Hematology

## 2014-10-30 NOTE — Telephone Encounter (Signed)
per [employer] to r/s pt appt-cld & r/s appt left message w/time & date-mailed avs

## 2014-11-03 ENCOUNTER — Other Ambulatory Visit (INDEPENDENT_AMBULATORY_CARE_PROVIDER_SITE_OTHER): Payer: Medicare Other

## 2014-11-03 DIAGNOSIS — D539 Nutritional anemia, unspecified: Secondary | ICD-10-CM

## 2014-11-03 LAB — CBC WITH DIFFERENTIAL/PLATELET
BASOS ABS: 0.1 10*3/uL (ref 0.0–0.1)
Basophils Relative: 1.1 % (ref 0.0–3.0)
EOS ABS: 0.3 10*3/uL (ref 0.0–0.7)
EOS PCT: 5.7 % — AB (ref 0.0–5.0)
HEMATOCRIT: 39.7 % (ref 36.0–46.0)
HEMOGLOBIN: 12.9 g/dL (ref 12.0–15.0)
Lymphocytes Relative: 29.7 % (ref 12.0–46.0)
Lymphs Abs: 1.6 10*3/uL (ref 0.7–4.0)
MCHC: 32.5 g/dL (ref 30.0–36.0)
MCV: 80.7 fl (ref 78.0–100.0)
Monocytes Absolute: 0.6 10*3/uL (ref 0.1–1.0)
Monocytes Relative: 11.3 % (ref 3.0–12.0)
Neutro Abs: 2.8 10*3/uL (ref 1.4–7.7)
Neutrophils Relative %: 52.2 % (ref 43.0–77.0)
Platelets: 207 10*3/uL (ref 150.0–400.0)
RBC: 4.92 Mil/uL (ref 3.87–5.11)
RDW: 19.6 % — ABNORMAL HIGH (ref 11.5–15.5)
WBC: 5.3 10*3/uL (ref 4.0–10.5)

## 2014-11-04 ENCOUNTER — Telehealth: Payer: Self-pay | Admitting: Hematology

## 2014-11-04 ENCOUNTER — Other Ambulatory Visit: Payer: Self-pay | Admitting: *Deleted

## 2014-11-04 DIAGNOSIS — C50211 Malignant neoplasm of upper-inner quadrant of right female breast: Secondary | ICD-10-CM

## 2014-11-04 NOTE — Telephone Encounter (Signed)
Spoke with patient and she declines survivorship at this time

## 2014-11-05 ENCOUNTER — Ambulatory Visit: Payer: Medicare Other | Admitting: Hematology

## 2014-11-06 ENCOUNTER — Ambulatory Visit: Payer: Medicare Other | Admitting: Hematology

## 2014-11-10 ENCOUNTER — Telehealth: Payer: Self-pay | Admitting: Hematology

## 2014-11-10 ENCOUNTER — Encounter: Payer: Self-pay | Admitting: Hematology

## 2014-11-10 ENCOUNTER — Ambulatory Visit (HOSPITAL_BASED_OUTPATIENT_CLINIC_OR_DEPARTMENT_OTHER): Payer: Medicare Other | Admitting: Hematology

## 2014-11-10 VITALS — BP 140/73 | HR 77 | Temp 98.2°F | Resp 18 | Ht 62.0 in | Wt 193.5 lb

## 2014-11-10 DIAGNOSIS — D0512 Intraductal carcinoma in situ of left breast: Secondary | ICD-10-CM | POA: Diagnosis not present

## 2014-11-10 DIAGNOSIS — Z170 Estrogen receptor positive status [ER+]: Secondary | ICD-10-CM

## 2014-11-10 DIAGNOSIS — C50211 Malignant neoplasm of upper-inner quadrant of right female breast: Secondary | ICD-10-CM

## 2014-11-10 NOTE — Telephone Encounter (Signed)
per pof to sch pt appt-gave pt copy of avs °

## 2014-11-10 NOTE — Progress Notes (Signed)
Chelsea [name]  [phone] [phone]  Clinic Follow Up Note   Patient Care Team: Thressa Sheller, MD as PCP - General (Internal Medicine) Thressa Sheller, MD as Consulting Physician (Internal Medicine) Fanny Skates, MD as Consulting Physician (General Surgery) Truitt Merle, MD as Consulting Physician (Hematology) Thea Silversmith, MD as Consulting Physician (Radiation Oncology) Mauro Kaufmann, RN as Registered Nurse Rockwell Germany, RN as Registered Nurse Sylvan Cheese, NP as Nurse Practitioner (Nurse Practitioner) 11/10/2014  CHIEF COMPLAINTS:  Follow up right breast DCIS  Oncology History   Breast cancer of upper-inner quadrant of right female breast   Staging form: Breast, AJCC 7th Edition     Clinical: Stage 0 (Tis (DCIS), N0, M0) - Unsigned       Breast cancer of upper-inner quadrant of right female breast ([place])   09/25/2014 Mammogram The new cluster of heterogeneous calcifications in the right breast upper inner quadrant is suspicious of malignancy.   10/01/2014 Receptors her2 ER 100% positive, PR 40% positive   10/01/2014 Initial Diagnosis Breast cancer of upper-inner quadrant of right female breast   10/01/2014 Initial Biopsy Right breast needle core biopsy showed ductal carcinoma in situ with necrosis and calcification.   10/23/2014 Surgery Right breast lumpectomy, surgical margins were negative.   10/23/2014 Pathology Results Right breast lumpectomy showed DCIS with calcification and necrosis, 1 cm, margins were negative.  '  HISTORY OF PRESENTING ILLNESS:  Chelsea [name] 81 y.o. female is here because of newly diagnosed right breast DCIS. She presents to our multidisciplinary breast clinic with her daughter.  This was discovered by screening mammogram. She did not have palpable breast mass, and denies any new symptoms.  She lives with her daughter, works around with a cane. She is not very active, able to do self-care, but not  much other activity. She had a stroke 6 years ago, and she has residual double vision. She also has multiple other medical issues, as listed below. She was previously seen at our cancer center by Dr. Ulanda Edison for ITP in 2009, she received prednisone and Rituxan, and has not had recurrence since then.  INTERIM HISTORY [place] returns for follow-up. She underwent right breast lumpectomy, which showed DCIS. She tolerated surgery very well, occasionally has little pain at his incision, she is not taking any pain medication. No arm swelling. She has good appetite, no other new complaints. She lives was her daughter and her family, not very physically active, still independent in her ADLs. She does not drive, was brought in by her son-in-law today.  MEDICAL HISTORY:  Past Medical History  Diagnosis Date  . ITP (idiopathic thrombocytopenic purpura) 2/09  . Osteoporosis   . Stroke ([place]) 02/03/09    mid brain, diplopia  . Hyperlipidemia   . Glaucoma     Bil  . HTN (hypertension)     takes Diovan daily  . GERD (gastroesophageal reflux disease)     takes Protonix daily  . Anemia     takes Ferrous Sulfate daily  . Bruises easily   . Emphysema lung ([place])   . Dizziness   . Arthritis   . Joint pain   . Joint swelling   . Peripheral edema   . Eczema   . Basal cell carcinoma of breast 1998  . History of hiatal hernia   . Nocturia   . DM (diabetes mellitus) ([place])     takes Metformin daily    SURGICAL HISTORY: Past Surgical History  Procedure Laterality Date  .  Myomectomy N/A 03/14/1995    late 90's per pt  . Cataract extraction Bilateral   . Refractive surgery Bilateral   . Parathyroidectomy  2008  . Tonsillectomy  as a child  . Colonoscopy    . Esophagogastroduodenoscopy    . Breast lumpectomy with radioactive seed localization Right 10/23/2014    Procedure: RIGHT BREAST LUMPECTOMY WITH RADIOACTIVE SEED LOCALIZATION;  Surgeon: Fanny Skates, MD;  Location: [facility];  Service: General;   Laterality: Right;   GYN HISTORY  Menarchal: 15 LMP: in 1970 Contraceptive: no   HRT: no  G1P1:   SOCIAL HISTORY: Social History   Social History  . Marital Status: Divorced    Spouse Name: N/A  . Number of Children: N/A  . Years of Education: N/A   Occupational History  . Not on file.   Social History Main Topics  . Smoking status: Never Smoker   . Smokeless tobacco: Never Used  . Alcohol Use: No  . Drug Use: No  . Sexual Activity: Not on file   Other Topics Concern  . Not on file   Social History Narrative    FAMILY HISTORY: Family History  Problem Relation Age of Onset  . Breast cancer Mother 37  . Colon cancer Father   . Diabetes Father     questionable  . Multiple sclerosis Daughter   . Heart disease Maternal Grandfather     ALLERGIES:  is allergic to travatan z.  MEDICATIONS:  Current Outpatient Prescriptions  Medication Sig Dispense Refill  . aspirin 81 MG tablet Take 81 mg by mouth daily.    . calcium carbonate (TUMS - DOSED IN MG ELEMENTAL CALCIUM) 500 MG chewable tablet Chew 1 tablet by mouth as needed for indigestion or heartburn.    . Cholecalciferol (VITAMIN D3) 2000 UNITS TABS Take 2,000 Units by mouth daily.    . ferrous sulfate (FERROUSUL) 325 (65 FE) MG tablet Take 1 tablet (325 mg total) by mouth daily with breakfast. 30 tablet 3  . fexofenadine (ALLEGRA) 180 MG tablet Take 180 mg by mouth daily as needed for allergies.   3  . HYDROcodone-acetaminophen (NORCO) 5-325 MG per tablet Take 1-2 tablets by mouth every 6 (six) hours as needed for moderate pain or severe pain. 30 tablet 0  . metFORMIN (GLUCOPHAGE) 500 MG tablet Take 500 mg by mouth 2 (two) times daily with a meal.    . Omega 3 1200 MG CAPS Take 1,200 mg by mouth daily.     . pantoprazole (PROTONIX) 40 MG tablet Take 40 mg by mouth daily.     . valsartan-hydrochlorothiazide (DIOVAN-HCT) 320-25 MG per tablet Take 0.5 tablets by mouth daily.      No current facility-administered  medications for this visit.    REVIEW OF SYSTEMS:   Constitutional: Denies fevers, chills or abnormal night sweats Eyes: Denies blurriness of vision, double vision or watery eyes Ears, nose, mouth, throat, and face: Denies mucositis or sore throat Respiratory: Denies cough, dyspnea or wheezes Cardiovascular: Denies palpitation, chest discomfort or lower extremity swelling Gastrointestinal:  Denies nausea, heartburn or change in bowel habits Skin: Denies abnormal skin rashes Lymphatics: Denies new lymphadenopathy or easy bruising Neurological:Denies numbness, tingling or new weaknesses Behavioral/Psych: Mood is stable, no new changes  All other systems were reviewed with the patient and are negative.  PHYSICAL EXAMINATION: ECOG PERFORMANCE STATUS: 2  Filed Vitals:   11/10/14 0906  BP: 140/73  Pulse: 77  Temp: 98.2 F (36.8 C)  Resp: 18  Filed Weights   11/10/14 0906  Weight: 193 lb 8 oz (87.771 kg)    GENERAL:alert, no distress and comfortable SKIN: skin color, texture, turgor are normal, no rashes or significant lesions EYES: normal, conjunctiva are pink and non-injected, sclera clear OROPHARYNX:no exudate, no erythema and lips, buccal mucosa, and tongue normal  NECK: supple, thyroid normal size, non-tender, without nodularity LYMPH:  no palpable lymphadenopathy in the cervical, axillary or inguinal LUNGS: clear to auscultation and percussion with normal breathing effort HEART: regular rate & rhythm and no murmurs and no lower extremity edema ABDOMEN:abdomen soft, non-tender and normal bowel sounds Musculoskeletal:no cyanosis of digits and no clubbing  PSYCH: alert & oriented x 3 with fluent speech NEURO: no focal motor/sensory deficits Breasts: Breast inspection showed them to be symmetrical with no nipple discharge. Right breast lumpectomy incision site clean, no discharge or skin erythema. Healing well. . Palpation of the left breast and axilla revealed no obvious mass  that I could appreciate.   LABORATORY DATA:  I have reviewed the data as listed Lab Results  Component Value Date   WBC 5.3 11/03/2014   HGB 12.9 11/03/2014   HCT 39.7 11/03/2014   MCV 80.7 11/03/2014   PLT 207.0 11/03/2014    Recent Labs  10/07/14 0820 10/20/14 1417  NA 142 139  K 4.5 4.7  CL  --  103  CO2 30* 26  GLUCOSE 119 152*  BUN 11.6 11  CREATININE 0.8 0.81  CALCIUM 9.4 9.2  GFRNONAA  --  >60  GFRAA  --  >60  PROT 7.1 6.9  ALBUMIN 3.7 3.6  AST 20 25  ALT 19 16  ALKPHOS 69 62  BILITOT 0.32 0.2*    PATHOLOGY REPORT  Diagnosis 10/23/2014  Breast, lumpectomy, Right - DUCTAL CARCINOMA IN SITU WITH CALCIFICATIONS AND NECROSIS, 1 CM. - MARGINS NOT INVOLVED. - CLOSEST MARGIN POSTERIOR AT 0.9 CM. - BIOPSY SITE REACTION. - FIBROCYSTIC CHANGES WITH FOCAL USUAL DUCTAL HYPERPLASIA AND CALCIFICATIONS. Microscopic Comment BREAST, IN SITU CARCINOMA Specimen, including laterality: Right breast. Procedure (include lymph node sampling sentinel-non-sentinel): Localized lumpectomy without lymph nodes. Grade of carcinoma: High grade. Necrosis: Present. Estimated tumor size: (glass slide measurement): 1 cm Treatment effect: No If present, treatment effect in breast tissue, lymph nodes or both: N/A Distance to closest margin: 0.9 cm from posterior and superior margins. If margin positive, focally or broadly: N/A Breast prognostic profile: [id number] Estrogen receptor: 100%, positive, strong staining. Progesterone receptor: 40%, strong staining. Lymph nodes: Examined: 0 Sentinel 0 Non-sentinel 0 Total Lymph nodes with metastasis: N/A Isolated tumor cells (< 0.2 mm): N/A Micrometastasis ( > 0.2 mm and < 2.0 mm): N/A Macrometastasis (> 2.0 mm): N/A Extranodal extension: N/A TNM: pTis, pNX   RADIOGRAPHIC STUDIES: I have personally reviewed the radiological images as listed and agreed with the findings in the report.  Diagnostic mammogram 09/25/2014 The new  cluster of heterogeneous calcifications in the right breast is suspicious of malignancy. Biopsies recommended.  ASSESSMENT & PLAN:  81 year old Caucasian female, postmenopausal, with multiple medical comorbidities, presented with screening distracted right breast DCIS.  1. Right breast DCIS, stage 0, intermediate-grade, ER/PR positive -I discussed her surgical pathology findings. -The patient had early stage disease. She is considered cured of disease by complete surgical resection.  -Her surgical margins were widely negative (0.9cm), I do not think she needs radiation due to her advanced age. -Her tumor is ER/PR positive, we discussed the role of chemoprevention with tamoxifen or anastrozole. Giving her advanced age and  comorbidities, I think the benefit is small, certainly there is no survival benefit. So I do not recommend-in therapy after surgery. -We discussed breast cancer surveillance. She agrees to continue annual mammogram. I'll see her every 6-12 months for physical exam including breast exam -I encouraged her to have healthy diet, and to be as physically active as she can.  2. She will continue follow-up with her primary care physician for other medical issues.  Plan -I'll see her back in 6 months with lab and physical exam -She is scheduled to have a flu shot early next months.   All questions were answered. The patient knows to call the clinic with any problems, questions or concerns. I spent 20 minutes counseling the patient face to face. The total time spent in the appointment was 25 minutes and more than 50% was on counseling.     Truitt Merle, MD 11/10/2014 9:31 AM

## 2014-12-03 DIAGNOSIS — H402221 Chronic angle-closure glaucoma, left eye, mild stage: Secondary | ICD-10-CM | POA: Diagnosis not present

## 2014-12-03 DIAGNOSIS — H402213 Chronic angle-closure glaucoma, right eye, severe stage: Secondary | ICD-10-CM | POA: Diagnosis not present

## 2014-12-30 ENCOUNTER — Encounter: Payer: Self-pay | Admitting: Podiatry

## 2014-12-30 ENCOUNTER — Ambulatory Visit (INDEPENDENT_AMBULATORY_CARE_PROVIDER_SITE_OTHER): Payer: Medicare Other | Admitting: Podiatry

## 2014-12-30 DIAGNOSIS — M79673 Pain in unspecified foot: Secondary | ICD-10-CM

## 2014-12-30 DIAGNOSIS — Z85828 Personal history of other malignant neoplasm of skin: Secondary | ICD-10-CM | POA: Diagnosis not present

## 2014-12-30 DIAGNOSIS — I8312 Varicose veins of left lower extremity with inflammation: Secondary | ICD-10-CM | POA: Diagnosis not present

## 2014-12-30 DIAGNOSIS — B351 Tinea unguium: Secondary | ICD-10-CM | POA: Diagnosis not present

## 2014-12-30 DIAGNOSIS — I872 Venous insufficiency (chronic) (peripheral): Secondary | ICD-10-CM | POA: Diagnosis not present

## 2014-12-30 DIAGNOSIS — I8311 Varicose veins of right lower extremity with inflammation: Secondary | ICD-10-CM | POA: Diagnosis not present

## 2014-12-31 NOTE — Progress Notes (Signed)
Subjective:     Patient ID: Chelsea [name], female   DOB: 1928/04/27, 81 y.o.   MRN: [id number]  HPI patient presents with significant elongation of nails 1-5 both feet that are thick yellow and brittle and she cannot cut them herself with long-term history of diabetes   Review of Systems     Objective:   Physical Exam Neurovascular status diminished but intact with thick yellow brittle nailbeds 1-5 both feet that are painful    Assessment:     Mycotic nail infection with pain 1-5 both feet    Plan:     Debride at risk nails that she cannot take care of herself 1-5 both feet with no iatrogenic bleeding noted

## 2015-01-04 ENCOUNTER — Encounter: Payer: Self-pay | Admitting: Nurse Practitioner

## 2015-01-04 DIAGNOSIS — C50211 Malignant neoplasm of upper-inner quadrant of right female breast: Secondary | ICD-10-CM

## 2015-01-04 NOTE — Progress Notes (Signed)
The Survivorship Care Plan was mailed to [name] as she reported not being able to come in to the Survivorship Clinic for an in-person visit at this time. A letter was mailed to her outlining the purpose of the content of the care plan, as well as encouraging her to reach out to me with any questions or concerns.  My business card was included in the correspondence to the patient as well.  A copy of the care plan was also routed/faxed/mailed to [name], MD, the patient's PCP.  I will not be placing any follow-up appointments to the Survivorship Clinic for Chelsea [name], but I am happy to see her at any time in the future for any survivorship concerns that may arise. Thank you for allowing me to participate in her care!  [name], [place] [phone]

## 2015-01-25 DIAGNOSIS — E119 Type 2 diabetes mellitus without complications: Secondary | ICD-10-CM | POA: Diagnosis not present

## 2015-01-25 DIAGNOSIS — E559 Vitamin D deficiency, unspecified: Secondary | ICD-10-CM | POA: Diagnosis not present

## 2015-01-25 DIAGNOSIS — Z23 Encounter for immunization: Secondary | ICD-10-CM | POA: Diagnosis not present

## 2015-01-25 DIAGNOSIS — D649 Anemia, unspecified: Secondary | ICD-10-CM | POA: Diagnosis not present

## 2015-01-25 DIAGNOSIS — Z0000 Encounter for general adult medical examination without abnormal findings: Secondary | ICD-10-CM | POA: Diagnosis not present

## 2015-01-25 DIAGNOSIS — I10 Essential (primary) hypertension: Secondary | ICD-10-CM | POA: Diagnosis not present

## 2015-02-10 ENCOUNTER — Telehealth: Payer: Self-pay | Admitting: Internal Medicine

## 2015-02-10 NOTE — Telephone Encounter (Signed)
pt cld to r/s appt-gave r/s time & date 

## 2015-04-07 ENCOUNTER — Ambulatory Visit: Payer: Medicare Other | Admitting: Podiatry

## 2015-04-08 ENCOUNTER — Encounter: Payer: Self-pay | Admitting: Podiatry

## 2015-04-08 ENCOUNTER — Ambulatory Visit (INDEPENDENT_AMBULATORY_CARE_PROVIDER_SITE_OTHER): Payer: Medicare Other | Admitting: Podiatry

## 2015-04-08 DIAGNOSIS — Q828 Other specified congenital malformations of skin: Secondary | ICD-10-CM

## 2015-04-08 DIAGNOSIS — B351 Tinea unguium: Secondary | ICD-10-CM | POA: Diagnosis not present

## 2015-04-08 DIAGNOSIS — M79673 Pain in unspecified foot: Secondary | ICD-10-CM

## 2015-04-08 NOTE — Progress Notes (Signed)
Patient ID: Chelsea [name], female   DOB: Mar 26, 1928, 82 y.o.   MRN: [id number] Complaint:  Visit Type: Patient returns to my office for continued preventative foot care services. Complaint: Patient states" my nails have grown long and thick and become painful to walk and wear shoes" Patient has been diagnosed with DM with no foot complications. The patient presents for preventative foot care services. No changes to ROS.  Painful callus under the outside ball left foot.  She is concerned about her right foot hammer toes being tight in her shoes.l  Podiatric Exam: Vascular: dorsalis pedis and posterior tibial pulses are palpable bilateral. Capillary return is immediate. Temperature gradient is WNL. Skin turgor WNL  Sensorium: Normal Semmes Weinstein monofilament test. Normal tactile sensation bilaterally. Nail Exam: Pt has thick disfigured discolored nails with subungual debris noted bilateral entire nail secon  through fifth toenails Ulcer Exam: There is no evidence of ulcer or pre-ulcerative changes or infection. Orthopedic Exam: Muscle tone and strength are WNL. No limitations in general ROM. No crepitus or effusions noted. Foot type and digits show no abnormalities. Bony prominences are unremarkable. Hammer toes 2-4 B/L. Skin:  Porokeratosis sub 5th left foot.No infection or ulcers  Diagnosis:  Onychomycosis, , Pain in right toe, pain in left toes  Treatment & Plan Procedures and Treatment: Consent by patient was obtained for treatment procedures. The patient understood the discussion of treatment and procedures well. All questions were answered thoroughly reviewed. Debridement of mycotic and hypertrophic toenails, 1 through 5 bilateral and clearing of subungual debris. No ulceration, no infection noted. Dispense crest pad.  Debride porokeratosis left foot.  Return Visit-Office Procedure: Patient instructed to return to the office for a follow up visit 3 months for continued evaluation and  treatment.    Gardiner Barefoot DPM

## 2015-04-20 DIAGNOSIS — I10 Essential (primary) hypertension: Secondary | ICD-10-CM | POA: Diagnosis not present

## 2015-04-20 DIAGNOSIS — E119 Type 2 diabetes mellitus without complications: Secondary | ICD-10-CM | POA: Diagnosis not present

## 2015-04-21 DIAGNOSIS — H402213 Chronic angle-closure glaucoma, right eye, severe stage: Secondary | ICD-10-CM | POA: Diagnosis not present

## 2015-04-21 DIAGNOSIS — H402221 Chronic angle-closure glaucoma, left eye, mild stage: Secondary | ICD-10-CM | POA: Diagnosis not present

## 2015-04-26 DIAGNOSIS — I10 Essential (primary) hypertension: Secondary | ICD-10-CM | POA: Diagnosis not present

## 2015-04-26 DIAGNOSIS — E559 Vitamin D deficiency, unspecified: Secondary | ICD-10-CM | POA: Diagnosis not present

## 2015-04-26 DIAGNOSIS — E119 Type 2 diabetes mellitus without complications: Secondary | ICD-10-CM | POA: Diagnosis not present

## 2015-04-29 DIAGNOSIS — D539 Nutritional anemia, unspecified: Secondary | ICD-10-CM | POA: Diagnosis not present

## 2015-04-29 DIAGNOSIS — K219 Gastro-esophageal reflux disease without esophagitis: Secondary | ICD-10-CM | POA: Diagnosis not present

## 2015-04-29 DIAGNOSIS — E119 Type 2 diabetes mellitus without complications: Secondary | ICD-10-CM | POA: Diagnosis not present

## 2015-04-29 DIAGNOSIS — D0511 Intraductal carcinoma in situ of right breast: Secondary | ICD-10-CM | POA: Diagnosis not present

## 2015-04-29 DIAGNOSIS — I10 Essential (primary) hypertension: Secondary | ICD-10-CM | POA: Diagnosis not present

## 2015-04-29 DIAGNOSIS — Z8673 Personal history of transient ischemic attack (TIA), and cerebral infarction without residual deficits: Secondary | ICD-10-CM | POA: Diagnosis not present

## 2015-04-29 DIAGNOSIS — E892 Postprocedural hypoparathyroidism: Secondary | ICD-10-CM | POA: Diagnosis not present

## 2015-04-29 DIAGNOSIS — Z862 Personal history of diseases of the blood and blood-forming organs and certain disorders involving the immune mechanism: Secondary | ICD-10-CM | POA: Diagnosis not present

## 2015-05-11 ENCOUNTER — Ambulatory Visit: Payer: Medicare Other | Admitting: Hematology

## 2015-05-11 ENCOUNTER — Other Ambulatory Visit: Payer: Medicare Other

## 2015-05-17 ENCOUNTER — Telehealth: Payer: Self-pay | Admitting: Hematology

## 2015-05-17 ENCOUNTER — Telehealth: Payer: Self-pay | Admitting: Nurse Practitioner

## 2015-05-17 NOTE — Telephone Encounter (Signed)
returned call and lvm for pt confirming April appt

## 2015-05-17 NOTE — Telephone Encounter (Signed)
Called patient's daughter to introduce to the [facility] telemedicine video visit.  There was no answer and voicemail was left.

## 2015-05-18 ENCOUNTER — Ambulatory Visit (HOSPITAL_BASED_OUTPATIENT_CLINIC_OR_DEPARTMENT_OTHER): Payer: Medicare Other | Admitting: Hematology

## 2015-05-18 ENCOUNTER — Encounter: Payer: Self-pay | Admitting: Hematology

## 2015-05-18 ENCOUNTER — Telehealth: Payer: Self-pay | Admitting: Hematology

## 2015-05-18 ENCOUNTER — Other Ambulatory Visit (HOSPITAL_BASED_OUTPATIENT_CLINIC_OR_DEPARTMENT_OTHER): Payer: Medicare Other

## 2015-05-18 VITALS — BP 154/75 | HR 73 | Temp 97.7°F | Resp 18 | Wt 184.5 lb

## 2015-05-18 DIAGNOSIS — D0512 Intraductal carcinoma in situ of left breast: Secondary | ICD-10-CM

## 2015-05-18 DIAGNOSIS — C50211 Malignant neoplasm of upper-inner quadrant of right female breast: Secondary | ICD-10-CM

## 2015-05-18 LAB — CBC WITH DIFFERENTIAL/PLATELET
BASO%: 1.4 % (ref 0.0–2.0)
BASOS ABS: 0.1 10*3/uL (ref 0.0–0.1)
EOS ABS: 0.1 10*3/uL (ref 0.0–0.5)
EOS%: 2.9 % (ref 0.0–7.0)
HEMATOCRIT: 44.1 % (ref 34.8–46.6)
HEMOGLOBIN: 14.5 g/dL (ref 11.6–15.9)
LYMPH#: 1.5 10*3/uL (ref 0.9–3.3)
LYMPH%: 30.8 % (ref 14.0–49.7)
MCH: 29.6 pg (ref 25.1–34.0)
MCHC: 33 g/dL (ref 31.5–36.0)
MCV: 89.8 fL (ref 79.5–101.0)
MONO#: 0.5 10*3/uL (ref 0.1–0.9)
MONO%: 10.8 % (ref 0.0–14.0)
NEUT#: 2.6 10*3/uL (ref 1.5–6.5)
NEUT%: 54.1 % (ref 38.4–76.8)
PLATELETS: 159 10*3/uL (ref 145–400)
RBC: 4.91 10*6/uL (ref 3.70–5.45)
RDW: 13.6 % (ref 11.2–14.5)
WBC: 4.9 10*3/uL (ref 3.9–10.3)

## 2015-05-18 LAB — COMPREHENSIVE METABOLIC PANEL
ALBUMIN: 3.6 g/dL (ref 3.5–5.0)
ALK PHOS: 47 U/L (ref 40–150)
ALT: 14 U/L (ref 0–55)
ANION GAP: 9 meq/L (ref 3–11)
AST: 18 U/L (ref 5–34)
BUN: 11.9 mg/dL (ref 7.0–26.0)
CALCIUM: 9.4 mg/dL (ref 8.4–10.4)
CO2: 29 mEq/L (ref 22–29)
CREATININE: 0.8 mg/dL (ref 0.6–1.1)
Chloride: 105 mEq/L (ref 98–109)
EGFR: 68 mL/min/{1.73_m2} — ABNORMAL LOW (ref >90)
Glucose: 105 mg/dl (ref 70–140)
POTASSIUM: 4 meq/L (ref 3.5–5.1)
Sodium: 143 mEq/L (ref 136–145)
Total Bilirubin: 0.68 mg/dL (ref 0.20–1.20)
Total Protein: 6.8 g/dL (ref 6.4–8.3)

## 2015-05-18 NOTE — Telephone Encounter (Signed)
Gave adn printed April 2017 and April 2018

## 2015-05-18 NOTE — Telephone Encounter (Signed)
Gave adn pritned appt sched and avs for pt for Aug 2017 and April 2018

## 2015-05-18 NOTE — Progress Notes (Signed)
[name]  [phone] [phone]  Clinic Follow Up Note   Patient Care Team: Jani Gravel, MD as PCP - General (Internal Medicine) Thressa Sheller, MD as Consulting Physician (Internal Medicine) Fanny Skates, MD as Consulting Physician (General Surgery) Truitt Merle, MD as Consulting Physician (Hematology) Thea Silversmith, MD as Consulting Physician (Radiation Oncology) Mauro Kaufmann, RN as Registered Nurse Rockwell Germany, RN as Registered Nurse Sylvan Cheese, NP as Nurse Practitioner (Nurse Practitioner)  CHIEF COMPLAINTS:  Follow up right breast DCIS  Oncology History   Breast cancer of upper-inner quadrant of right female breast   Staging form: Breast, AJCC 7th Edition     Clinical: Stage 0 (Tis (DCIS), N0, M0) - Unsigned       Breast cancer of upper-inner quadrant of right female breast ([place])   09/25/2014 Mammogram Right breast: new cluster of heterogeneous calcifications in upper inner quadrant, suspicious of malignancy.   10/01/2014 Initial Biopsy Right breast needle core biopsy; ductal carcinoma in situ with necrosis and calcification, ER+ (100%), PR+ (40%)   10/02/2014 Clinical Stage Stage 0: Tis N0   10/23/2014 Definitive Surgery Right breast lumpectomy Chelsea [name]): DCIS with calcifications and necrosis, 1 cm, surgical margins were negative.   10/23/2014 Pathologic Stage Stage 0: Tis N0   01/04/2015 Survivorship Survivorship care plan completed and mailed to patient in lieu of in person visit.  '  HISTORY OF PRESENTING ILLNESS:  Chelsea [name] 82 y.o. female is here because of newly diagnosed right breast DCIS. She presents to our multidisciplinary breast clinic with her daughter.  This was discovered by screening mammogram. She did not have palpable breast mass, and denies any new symptoms.  She lives with her daughter, works around with a cane. She is not very active, able to do self-care, but not much other activity. She had a  stroke 6 years ago, and she has residual double vision. She also has multiple other medical issues, as listed below. She was previously seen at our cancer center by Dr. Ulanda Edison for ITP in 2009, she received prednisone and Rituxan, and has not had recurrence since then.  CURRENT THERAPY: [facility]   INTERIM HISTORY Chelsea [name] returns for follow-up. She is doing well. She denies any new pain, or other symptoms. She lives with her daughter and her family, but still quite independent. She denies any ED visit or hospitalization in the past 6 months.   MEDICAL HISTORY:  Past Medical History  Diagnosis Date  . ITP (idiopathic thrombocytopenic purpura) 2/09  . Osteoporosis   . Stroke ([place]) 02/03/09    mid brain, diplopia  . Hyperlipidemia   . Glaucoma     Bil  . HTN (hypertension)     takes Diovan daily  . GERD (gastroesophageal reflux disease)     takes Protonix daily  . Anemia     takes Ferrous Sulfate daily  . Bruises easily   . Emphysema lung ([place])   . Dizziness   . Arthritis   . Joint pain   . Joint swelling   . Peripheral edema   . Eczema   . Basal cell carcinoma of breast 1998  . History of hiatal hernia   . Nocturia   . DM (diabetes mellitus) ([place])     takes Metformin daily    SURGICAL HISTORY: Past Surgical History  Procedure Laterality Date  . Myomectomy N/A 03/14/1995    late 90's per pt  . Cataract extraction Bilateral   . Refractive surgery Bilateral   .  Parathyroidectomy  2008  . Tonsillectomy  as a child  . Colonoscopy    . Esophagogastroduodenoscopy    . Breast lumpectomy with radioactive seed localization Right 10/23/2014    Procedure: RIGHT BREAST LUMPECTOMY WITH RADIOACTIVE SEED LOCALIZATION;  Surgeon: Fanny Skates, MD;  Location: [facility];  Service: General;  Laterality: Right;   GYN HISTORY  Menarchal: 15 LMP: in 1970 Contraceptive: no   HRT: no  G1P1:   SOCIAL HISTORY: Social History   Social History  . Marital Status: Divorced    Spouse Name:  N/A  . Number of Children: N/A  . Years of Education: N/A   Occupational History  . Not on file.   Social History Main Topics  . Smoking status: Never Smoker   . Smokeless tobacco: Never Used  . Alcohol Use: No  . Drug Use: No  . Sexual Activity: Not on file   Other Topics Concern  . Not on file   Social History Narrative    FAMILY HISTORY: Family History  Problem Relation Age of Onset  . Breast cancer Mother 22  . Colon cancer Father   . Diabetes Father     questionable  . Multiple sclerosis Daughter   . Heart disease Maternal Grandfather     ALLERGIES:  is allergic to travatan z.  MEDICATIONS:  Current Outpatient Prescriptions  Medication Sig Dispense Refill  . aspirin 81 MG tablet Take 81 mg by mouth daily.    . calcium carbonate (TUMS - DOSED IN MG ELEMENTAL CALCIUM) 500 MG chewable tablet Chew 1 tablet by mouth as needed for indigestion or heartburn.    . Cholecalciferol (VITAMIN D3) 2000 UNITS TABS Take 2,000 Units by mouth daily.    . ferrous sulfate (FERROUSUL) 325 (65 FE) MG tablet Take 1 tablet (325 mg total) by mouth daily with breakfast. 30 tablet 3  . fexofenadine (ALLEGRA) 180 MG tablet Take 180 mg by mouth daily as needed for allergies.   3  . metFORMIN (GLUCOPHAGE) 500 MG tablet Take 500 mg by mouth 2 (two) times daily with a meal.    . Omega 3 1200 MG CAPS Take 1,200 mg by mouth daily.     . pantoprazole (PROTONIX) 40 MG tablet Take 40 mg by mouth daily.     . valsartan-hydrochlorothiazide (DIOVAN-HCT) 320-25 MG per tablet Take 0.5 tablets by mouth daily.      No current facility-administered medications for this visit.    REVIEW OF SYSTEMS:   Constitutional: Denies fevers, chills or abnormal night sweats Eyes: Denies blurriness of vision, double vision or watery eyes Ears, nose, mouth, throat, and face: Denies mucositis or sore throat Respiratory: Denies cough, dyspnea or wheezes Cardiovascular: Denies palpitation, chest discomfort or lower  extremity swelling Gastrointestinal:  Denies nausea, heartburn or change in bowel habits Skin: Denies abnormal skin rashes Lymphatics: Denies new lymphadenopathy or easy bruising Neurological:Denies numbness, tingling or new weaknesses Behavioral/Psych: Mood is stable, no new changes  All other systems were reviewed with the patient and are negative.  PHYSICAL EXAMINATION: ECOG PERFORMANCE STATUS: 2  Filed Vitals:   05/18/15 0842  BP: 154/75  Pulse: 73  Temp: 97.7 F (36.5 C)  Resp: 18   Filed Weights   05/18/15 0842  Weight: 184 lb 8 oz (83.689 kg)    GENERAL:alert, no distress and comfortable SKIN: skin color, texture, turgor are normal, no rashes or significant lesions EYES: normal, conjunctiva are pink and non-injected, sclera clear OROPHARYNX:no exudate, no erythema and lips, buccal mucosa, and  tongue normal  NECK: supple, thyroid normal size, non-tender, without nodularity LYMPH:  no palpable lymphadenopathy in the cervical, axillary or inguinal LUNGS: clear to auscultation and percussion with normal breathing effort HEART: regular rate & rhythm and no murmurs and no lower extremity edema ABDOMEN:abdomen soft, non-tender and normal bowel sounds Musculoskeletal:no cyanosis of digits and no clubbing  PSYCH: alert & oriented x 3 with fluent speech NEURO: no focal motor/sensory deficits Breasts: Breast inspection showed them to be symmetrical with no nipple discharge. Right breast lumpectomy incision site clean, no discharge or skin erythema. Healing well. . Palpation of the left breast and axilla revealed no obvious mass that I could appreciate.   LABORATORY DATA:  I have reviewed the data as listed Lab Results  Component Value Date   WBC 4.9 05/18/2015   HGB 14.5 05/18/2015   HCT 44.1 05/18/2015   MCV 89.8 05/18/2015   PLT 159 05/18/2015    Recent Labs  10/07/14 0820 10/20/14 1417 05/18/15 0816  NA 142 139 143  K 4.5 4.7 4.0  CL  --  103  --   CO2 30* 26  29  GLUCOSE 119 152* 105  BUN 11.6 11 11.9  CREATININE 0.8 0.81 0.8  CALCIUM 9.4 9.2 9.4  GFRNONAA  --  >60  --   GFRAA  --  >60  --   PROT 7.1 6.9 6.8  ALBUMIN 3.7 3.6 3.6  AST 20 25 18   ALT 19 16 14   ALKPHOS 69 62 47  BILITOT 0.32 0.2* 0.68    PATHOLOGY REPORT  Diagnosis 10/23/2014  Breast, lumpectomy, Right - DUCTAL CARCINOMA IN SITU WITH CALCIFICATIONS AND NECROSIS, 1 CM. - MARGINS NOT INVOLVED. - CLOSEST MARGIN POSTERIOR AT 0.9 CM. - BIOPSY SITE REACTION. - FIBROCYSTIC CHANGES WITH FOCAL USUAL DUCTAL HYPERPLASIA AND CALCIFICATIONS. Microscopic Comment BREAST, IN SITU CARCINOMA Specimen, including laterality: Right breast. Procedure (include lymph node sampling sentinel-non-sentinel): Localized lumpectomy without lymph nodes. Grade of carcinoma: High grade. Necrosis: Present. Estimated tumor size: (glass slide measurement): 1 cm Treatment effect: No If present, treatment effect in breast tissue, lymph nodes or both: N/A Distance to closest margin: 0.9 cm from posterior and superior margins. If margin positive, focally or broadly: N/A Breast prognostic profile: [id number] Estrogen receptor: 100%, positive, strong staining. Progesterone receptor: 40%, strong staining. Lymph nodes: Examined: 0 Sentinel 0 Non-sentinel 0 Total Lymph nodes with metastasis: N/A Isolated tumor cells (< 0.2 mm): N/A Micrometastasis ( > 0.2 mm and < 2.0 mm): N/A Macrometastasis (> 2.0 mm): N/A Extranodal extension: N/A TNM: pTis, pNX   RADIOGRAPHIC STUDIES: I have personally reviewed the radiological images as listed and agreed with the findings in the report.  Diagnostic mammogram 09/25/2014 The new cluster of heterogeneous calcifications in the right breast is suspicious of malignancy. Biopsies recommended.  ASSESSMENT & PLAN:  82 year old Caucasian female, postmenopausal, with multiple medical comorbidities, presented with screening distracted right breast DCIS.  1. Right breast  DCIS, stage 0, intermediate-grade, ER/PR positive -I previously discussed her surgical pathology findings. -The patient had early stage disease. She is considered cured of disease by complete surgical resection.  -Her surgical margins were widely negative (0.9cm), I do not think she needs radiation due to her advanced age. -Her tumor is ER/PR positive, we discussed the role of chemoprevention with tamoxifen or anastrozole. Giving her advanced age and comorbidities, I think the benefit is small, certainly there is no survival benefit. So I do not recommend-in therapy after surgery. -Will continue breast cancer surveillance, including  annual mammogram, self exam and following up with [place] once a year.  -I offered her following up with our survivorship clinic, she declined  -I encouraged her to have healthy diet, and to be as physically active as she can.  2. She will continue follow-up with her primary care physician for other medical issues.  Plan -I'll see her back in 6 months with physical exam, no lab  -She will have mammogram in sep    All questions were answered. The patient knows to call the clinic with any problems, questions or concerns.  I spent 20 minutes counseling the patient face to face. The total time spent in the appointment was 25 minutes and more than 50% was on counseling.     Truitt Merle, MD 05/18/2015    Darreld Mclean

## 2015-05-19 ENCOUNTER — Telehealth: Payer: Self-pay

## 2015-05-19 NOTE — Telephone Encounter (Signed)
Called to speak with patient about telemedicine. No Answer, Left message

## 2015-07-08 ENCOUNTER — Ambulatory Visit: Payer: Medicare Other | Admitting: Podiatry

## 2015-07-15 ENCOUNTER — Ambulatory Visit: Payer: Medicare Other | Admitting: Podiatry

## 2015-07-16 DIAGNOSIS — M810 Age-related osteoporosis without current pathological fracture: Secondary | ICD-10-CM | POA: Diagnosis not present

## 2015-07-20 DIAGNOSIS — E119 Type 2 diabetes mellitus without complications: Secondary | ICD-10-CM | POA: Diagnosis not present

## 2015-07-20 DIAGNOSIS — E559 Vitamin D deficiency, unspecified: Secondary | ICD-10-CM | POA: Diagnosis not present

## 2015-07-20 DIAGNOSIS — I10 Essential (primary) hypertension: Secondary | ICD-10-CM | POA: Diagnosis not present

## 2015-07-26 DIAGNOSIS — E1142 Type 2 diabetes mellitus with diabetic polyneuropathy: Secondary | ICD-10-CM | POA: Diagnosis not present

## 2015-07-26 DIAGNOSIS — R609 Edema, unspecified: Secondary | ICD-10-CM | POA: Diagnosis not present

## 2015-07-28 ENCOUNTER — Other Ambulatory Visit: Payer: Self-pay | Admitting: Internal Medicine

## 2015-07-28 DIAGNOSIS — R609 Edema, unspecified: Secondary | ICD-10-CM

## 2015-07-30 ENCOUNTER — Ambulatory Visit
Admission: RE | Admit: 2015-07-30 | Discharge: 2015-07-30 | Disposition: A | Discharge status: Home / Self Care | Payer: Medicare Other | Source: Ambulatory Visit | Attending: Internal Medicine | Admitting: Internal Medicine

## 2015-07-30 DIAGNOSIS — R609 Edema, unspecified: Secondary | ICD-10-CM

## 2015-07-30 DIAGNOSIS — M7989 Other specified soft tissue disorders: Secondary | ICD-10-CM | POA: Diagnosis not present

## 2015-08-23 DIAGNOSIS — R609 Edema, unspecified: Secondary | ICD-10-CM | POA: Diagnosis not present

## 2015-08-23 DIAGNOSIS — E119 Type 2 diabetes mellitus without complications: Secondary | ICD-10-CM | POA: Diagnosis not present

## 2015-08-23 DIAGNOSIS — I10 Essential (primary) hypertension: Secondary | ICD-10-CM | POA: Diagnosis not present

## 2015-08-26 ENCOUNTER — Encounter: Payer: Self-pay | Admitting: Podiatry

## 2015-08-26 ENCOUNTER — Ambulatory Visit (INDEPENDENT_AMBULATORY_CARE_PROVIDER_SITE_OTHER): Payer: Medicare Other | Admitting: Podiatry

## 2015-08-26 DIAGNOSIS — M79673 Pain in unspecified foot: Secondary | ICD-10-CM

## 2015-08-26 DIAGNOSIS — B351 Tinea unguium: Secondary | ICD-10-CM | POA: Diagnosis not present

## 2015-08-26 NOTE — Progress Notes (Signed)
Patient ID: Chelsea [name], female   DOB: [birth year], 82 y.o.   MRN: [id number] Complaint:  Visit Type: Patient returns to my office for continued preventative foot care services. Complaint: Patient states" my nails have grown long and thick and become painful to walk and wear shoes" Patient has been diagnosed with DM with no foot complications. The patient presents for preventative foot care services. No changes to ROS.  Painful callus under the outside ball left foot.  She is concerned about her right foot hammer toes being tight in her shoes.l  Podiatric Exam: Vascular: dorsalis pedis and posterior tibial pulses are palpable bilateral. Capillary return is immediate. Temperature gradient is WNL. Skin turgor WNL  Sensorium: Normal Semmes Weinstein monofilament test. Normal tactile sensation bilaterally. Nail Exam: Pt has thick disfigured discolored nails with subungual debris noted bilateral entire nail secon  through fifth toenails Ulcer Exam: There is no evidence of ulcer or pre-ulcerative changes or infection. Orthopedic Exam: Muscle tone and strength are WNL. No limitations in general ROM. No crepitus or effusions noted. Foot type and digits show no abnormalities. Bony prominences are unremarkable. Hammer toes 2-4 B/L. Skin: Asymptomatic sub 5th metatarsal left foot..No infection or ulcers  Diagnosis:  Onychomycosis, , Pain in right toe, pain in left toes  Treatment & Plan Procedures and Treatment: Consent by patient was obtained for treatment procedures. The patient understood the discussion of treatment and procedures well. All questions were answered thoroughly reviewed. Debridement of mycotic and hypertrophic toenails, 1 through 5 bilateral and clearing of subungual debris. No ulceration, no infection noted.  Return Visit-Office Procedure: Patient instructed to return to the office for a follow up visit 3 months for continued evaluation and treatment.    Gardiner Barefoot DPM

## 2015-09-02 DIAGNOSIS — H402213 Chronic angle-closure glaucoma, right eye, severe stage: Secondary | ICD-10-CM | POA: Diagnosis not present

## 2015-09-02 DIAGNOSIS — E119 Type 2 diabetes mellitus without complications: Secondary | ICD-10-CM | POA: Diagnosis not present

## 2015-09-02 DIAGNOSIS — Z961 Presence of intraocular lens: Secondary | ICD-10-CM | POA: Diagnosis not present

## 2015-09-02 DIAGNOSIS — H402221 Chronic angle-closure glaucoma, left eye, mild stage: Secondary | ICD-10-CM | POA: Diagnosis not present

## 2015-09-02 DIAGNOSIS — H26491 Other secondary cataract, right eye: Secondary | ICD-10-CM | POA: Diagnosis not present

## 2015-09-02 DIAGNOSIS — H26492 Other secondary cataract, left eye: Secondary | ICD-10-CM | POA: Diagnosis not present

## 2015-09-23 DIAGNOSIS — H26492 Other secondary cataract, left eye: Secondary | ICD-10-CM | POA: Diagnosis not present

## 2015-11-02 DIAGNOSIS — R922 Inconclusive mammogram: Secondary | ICD-10-CM | POA: Diagnosis not present

## 2015-11-02 DIAGNOSIS — Z853 Personal history of malignant neoplasm of breast: Secondary | ICD-10-CM | POA: Diagnosis not present

## 2015-11-19 DIAGNOSIS — L821 Other seborrheic keratosis: Secondary | ICD-10-CM | POA: Diagnosis not present

## 2015-11-19 DIAGNOSIS — L720 Epidermal cyst: Secondary | ICD-10-CM | POA: Diagnosis not present

## 2015-11-19 DIAGNOSIS — D1801 Hemangioma of skin and subcutaneous tissue: Secondary | ICD-10-CM | POA: Diagnosis not present

## 2015-11-19 DIAGNOSIS — Z85828 Personal history of other malignant neoplasm of skin: Secondary | ICD-10-CM | POA: Diagnosis not present

## 2015-11-23 DIAGNOSIS — E892 Postprocedural hypoparathyroidism: Secondary | ICD-10-CM | POA: Diagnosis not present

## 2015-11-23 DIAGNOSIS — Z862 Personal history of diseases of the blood and blood-forming organs and certain disorders involving the immune mechanism: Secondary | ICD-10-CM | POA: Diagnosis not present

## 2015-11-23 DIAGNOSIS — Z8673 Personal history of transient ischemic attack (TIA), and cerebral infarction without residual deficits: Secondary | ICD-10-CM | POA: Diagnosis not present

## 2015-11-23 DIAGNOSIS — K219 Gastro-esophageal reflux disease without esophagitis: Secondary | ICD-10-CM | POA: Diagnosis not present

## 2015-11-23 DIAGNOSIS — I10 Essential (primary) hypertension: Secondary | ICD-10-CM | POA: Diagnosis not present

## 2015-11-23 DIAGNOSIS — E119 Type 2 diabetes mellitus without complications: Secondary | ICD-10-CM | POA: Diagnosis not present

## 2015-11-23 DIAGNOSIS — D0511 Intraductal carcinoma in situ of right breast: Secondary | ICD-10-CM | POA: Diagnosis not present

## 2015-11-23 DIAGNOSIS — D539 Nutritional anemia, unspecified: Secondary | ICD-10-CM | POA: Diagnosis not present

## 2015-11-30 DIAGNOSIS — R609 Edema, unspecified: Secondary | ICD-10-CM | POA: Diagnosis not present

## 2015-11-30 DIAGNOSIS — I10 Essential (primary) hypertension: Secondary | ICD-10-CM | POA: Diagnosis not present

## 2015-12-09 DIAGNOSIS — E119 Type 2 diabetes mellitus without complications: Secondary | ICD-10-CM | POA: Diagnosis not present

## 2015-12-09 DIAGNOSIS — E559 Vitamin D deficiency, unspecified: Secondary | ICD-10-CM | POA: Diagnosis not present

## 2015-12-09 DIAGNOSIS — I10 Essential (primary) hypertension: Secondary | ICD-10-CM | POA: Diagnosis not present

## 2015-12-09 DIAGNOSIS — Z23 Encounter for immunization: Secondary | ICD-10-CM | POA: Diagnosis not present

## 2015-12-09 DIAGNOSIS — M8580 Other specified disorders of bone density and structure, unspecified site: Secondary | ICD-10-CM | POA: Diagnosis not present

## 2015-12-16 ENCOUNTER — Ambulatory Visit: Payer: Medicare Other | Admitting: Podiatry

## 2015-12-23 ENCOUNTER — Ambulatory Visit: Payer: Medicare Other | Admitting: Podiatry

## 2016-01-05 ENCOUNTER — Encounter: Payer: Self-pay | Admitting: Podiatry

## 2016-01-05 ENCOUNTER — Ambulatory Visit (INDEPENDENT_AMBULATORY_CARE_PROVIDER_SITE_OTHER): Payer: Medicare Other | Admitting: Podiatry

## 2016-01-05 VITALS — Ht 62.0 in | Wt 184.0 lb

## 2016-01-05 DIAGNOSIS — M79676 Pain in unspecified toe(s): Secondary | ICD-10-CM

## 2016-01-05 DIAGNOSIS — B351 Tinea unguium: Secondary | ICD-10-CM

## 2016-01-05 NOTE — Progress Notes (Signed)
Patient ID: [name], female   DOB: 10-Jul-1928, 82 y.o.   MRN: [id number] Complaint:  Visit Type: Patient returns to my office for continued preventative foot care services. Complaint: Patient states" my nails have grown long and thick and become painful to walk and wear shoes" Patient has been diagnosed with DM with no foot complications. The patient presents for preventative foot care services. No changes to ROS.  Painful callus under the outside ball left foot.  She is concerned about her right foot hammer toes being tight in her shoes.l  Podiatric Exam: Vascular: dorsalis pedis and posterior tibial pulses are palpable bilateral. Capillary return is immediate. Temperature gradient is WNL. Skin turgor WNL  Sensorium: Normal Semmes Weinstein monofilament test. Normal tactile sensation bilaterally. Nail Exam: Pt has thick disfigured discolored nails with subungual debris noted bilateral entire nail secon  through fifth toenails Ulcer Exam: There is no evidence of ulcer or pre-ulcerative changes or infection. Orthopedic Exam: Muscle tone and strength are WNL. No limitations in general ROM. No crepitus or effusions noted. Foot type and digits show no abnormalities. Bony prominences are unremarkable. Hammer toes 2-4 B/L. Skin: Asymptomatic sub 5th metatarsal left foot..No infection or ulcers  Diagnosis:  Onychomycosis, , Pain in right toe, pain in left toes  Treatment & Plan Procedures and Treatment: Consent by patient was obtained for treatment procedures. The patient understood the discussion of treatment and procedures well. All questions were answered thoroughly reviewed. Debridement of mycotic and hypertrophic toenails, 1 through 5 bilateral and clearing of subungual debris. No ulceration, no infection noted.  Return Visit-Office Procedure: Patient instructed to return to the office for a follow up visit 3 months for continued evaluation and treatment.    Gardiner Barefoot DPM

## 2016-04-12 ENCOUNTER — Encounter: Payer: Self-pay | Admitting: Podiatry

## 2016-04-12 ENCOUNTER — Ambulatory Visit (INDEPENDENT_AMBULATORY_CARE_PROVIDER_SITE_OTHER): Payer: Medicare Other | Admitting: Podiatry

## 2016-04-12 VITALS — Ht 62.0 in | Wt 184.0 lb

## 2016-04-12 DIAGNOSIS — B351 Tinea unguium: Secondary | ICD-10-CM | POA: Diagnosis not present

## 2016-04-12 DIAGNOSIS — M79676 Pain in unspecified toe(s): Secondary | ICD-10-CM | POA: Diagnosis not present

## 2016-04-12 NOTE — Progress Notes (Signed)
Patient ID: JAVARIA KNAPKE, female   DOB: 25-Oct-1928, 83 y.o.   MRN: [id number] Complaint:  Visit Type: Patient returns to my office for continued preventative foot care services. Complaint: Patient states" my nails have grown long and thick and become painful to walk and wear shoes" Patient has been diagnosed with DM with no foot complications. The patient presents for preventative foot care services. No changes to ROS.  l  Podiatric Exam: Vascular: dorsalis pedis and posterior tibial pulses are palpable bilateral. Capillary return is immediate. Temperature gradient is WNL. Skin turgor WNL  Sensorium: Normal Semmes Weinstein monofilament test. Normal tactile sensation bilaterally. Nail Exam: Pt has thick disfigured discolored nails with subungual debris noted bilateral entire nail secon  through fifth toenails Ulcer Exam: There is no evidence of ulcer or pre-ulcerative changes or infection. Orthopedic Exam: Muscle tone and strength are WNL. No limitations in general ROM. No crepitus or effusions noted. Foot type and digits show no abnormalities. Bony prominences are unremarkable. Hammer toes 2-4 B/L. Skin: Asymptomatic sub 5th metatarsal left foot..No infection or ulcers  Diagnosis:  Onychomycosis, , Pain in right toe, pain in left toes  Treatment & Plan Procedures and Treatment: Consent by patient was obtained for treatment procedures. The patient understood the discussion of treatment and procedures well. All questions were answered thoroughly reviewed. Debridement of mycotic and hypertrophic toenails, 1 through 5 bilateral and clearing of subungual debris. No ulceration, no infection noted.  Return Visit-Office Procedure: Patient instructed to return to the office for a follow up visit 3 months for continued evaluation and treatment.    Gardiner Barefoot DPM

## 2016-04-21 DIAGNOSIS — H01003 Unspecified blepharitis right eye, unspecified eyelid: Secondary | ICD-10-CM | POA: Diagnosis not present

## 2016-04-21 DIAGNOSIS — H402213 Chronic angle-closure glaucoma, right eye, severe stage: Secondary | ICD-10-CM | POA: Diagnosis not present

## 2016-04-21 DIAGNOSIS — H402221 Chronic angle-closure glaucoma, left eye, mild stage: Secondary | ICD-10-CM | POA: Diagnosis not present

## 2016-05-04 DIAGNOSIS — N6324 Unspecified lump in the left breast, lower inner quadrant: Secondary | ICD-10-CM | POA: Diagnosis not present

## 2016-05-15 NOTE — Progress Notes (Signed)
[facility]  [phone] Fax:([phone]  Clinic Follow Up Note   Patient Care Team: Jani Gravel, MD as PCP - General (Internal Medicine) Thressa Sheller, MD as Consulting Physician (Internal Medicine) Fanny Skates, MD as Consulting Physician (General Surgery) Truitt Merle, MD as Consulting Physician (Hematology) Thea Silversmith, MD (Inactive) as Consulting Physician (Radiation Oncology) Mauro Kaufmann, RN as Registered Nurse Rockwell Germany, RN as Registered Nurse Sylvan Cheese, NP as Nurse Practitioner (Nurse Practitioner)  CHIEF COMPLAINTS:  Follow up right breast DCIS  Oncology History   Breast cancer of upper-inner quadrant of right female breast   Staging form: Breast, AJCC 7th Edition     Clinical: Stage 0 (Tis (DCIS), N0, M0) - Unsigned       Breast cancer of upper-inner quadrant of right female breast ([place])   09/25/2014 Mammogram    Right breast: new cluster of heterogeneous calcifications in upper inner quadrant, suspicious of malignancy.      10/01/2014 Initial Biopsy    Right breast needle core biopsy; ductal carcinoma in situ with necrosis and calcification, ER+ (100%), PR+ (40%)      10/02/2014 Clinical Stage    Stage 0: Tis N0      10/23/2014 Definitive Surgery    Right breast lumpectomy [facility]): DCIS with calcifications and necrosis, 1 cm, surgical margins were negative.      10/23/2014 Pathologic Stage    Stage 0: Tis N0      01/04/2015 Survivorship    Survivorship care plan completed and mailed to patient in lieu of in person visit.     ' HISTORY OF PRESENTING ILLNESS:  Chelsea [name] 83 y.o. female is here because of newly diagnosed right breast DCIS. She presents to our multidisciplinary breast clinic with her daughter.  This was discovered by screening mammogram. She did not have palpable breast mass, and denies any new symptoms.  She lives with her daughter, works around with a cane. She is not very  active, able to do self-care, but not much other activity. She had a stroke 6 years ago, and she has residual double vision. She also has multiple other medical issues, as listed below. She was previously seen at our cancer center by Dr. Ulanda Edison for ITP in 2009, she received prednisone and Rituxan, and has not had recurrence since then.  CURRENT THERAPY: [facility]   INTERIM HISTORY Adelyna returns for follow-up. She is doing well today. She had 8 teeth pulled recently, so she is still eating soft foods until she gets her dentures. She has had some problems with constipation, even after eating a lot of fruit. Pt has some leg swelling; left leg is worse than her right. Denies recent injury. Her cardiologist ordered an [place], which was normal. She tried compression socks, but she did not like them. Denies any other concerns.   MEDICAL HISTORY:  Past Medical History:  Diagnosis Date  . Anemia    takes Ferrous Sulfate daily  . Arthritis   . Basal cell carcinoma of breast 1998  . Bruises easily   . Dizziness   . DM (diabetes mellitus) ([place])    takes Metformin daily  . Eczema   . Emphysema lung ([place])   . GERD (gastroesophageal reflux disease)    takes Protonix daily  . Glaucoma    Bil  . History of hiatal hernia   . HTN (hypertension)    takes Diovan daily  . Hyperlipidemia   . ITP (idiopathic thrombocytopenic purpura) 2/09  .  Joint pain   . Joint swelling   . Nocturia   . Osteoporosis   . Peripheral edema   . Stroke ([place]) 02/03/09   mid brain, diplopia    SURGICAL HISTORY: Past Surgical History:  Procedure Laterality Date  . BREAST LUMPECTOMY WITH RADIOACTIVE SEED LOCALIZATION Right 10/23/2014   Procedure: RIGHT BREAST LUMPECTOMY WITH RADIOACTIVE SEED LOCALIZATION;  Surgeon: Fanny Skates, MD;  Location: [facility];  Service: General;  Laterality: Right;  . CATARACT EXTRACTION Bilateral   . COLONOSCOPY    . ESOPHAGOGASTRODUODENOSCOPY    . MYOMECTOMY N/A 03/14/1995   late 90's per pt    . PARATHYROIDECTOMY  2008  . REFRACTIVE SURGERY Bilateral   . TONSILLECTOMY  as a child   GYN HISTORY  Menarchal: 60 LMP: in 1970 Contraceptive: no   HRT: no  G1P1:  SOCIAL HISTORY: Social History   Social History  . Marital status: Divorced    Spouse name: N/A  . Number of children: N/A  . Years of education: N/A   Occupational History  . Not on file.   Social History Main Topics  . Smoking status: Never Smoker  . Smokeless tobacco: Never Used  . Alcohol use No  . Drug use: No  . Sexual activity: Not on file   Other Topics Concern  . Not on file   Social History Narrative  . No narrative on file    FAMILY HISTORY: Family History  Problem Relation Age of Onset  . Breast cancer Mother 49  . Colon cancer Father   . Diabetes Father     questionable  . Multiple sclerosis Daughter   . Heart disease Maternal Grandfather     ALLERGIES:  is allergic to travatan z [travoprost (bak free)].  MEDICATIONS:  Current Outpatient Prescriptions  Medication Sig Dispense Refill  . aspirin 81 MG tablet Take 81 mg by mouth daily.    . calcium carbonate (TUMS - DOSED IN MG ELEMENTAL CALCIUM) 500 MG chewable tablet Chew 1 tablet by mouth as needed for indigestion or heartburn.    . Cholecalciferol (VITAMIN D3) 2000 UNITS TABS Take 2,000 Units by mouth daily.    . fexofenadine (ALLEGRA) 180 MG tablet Take 180 mg by mouth daily as needed for allergies.   3  . metFORMIN (GLUCOPHAGE) 500 MG tablet Take 500 mg by mouth 2 (two) times daily with a meal.    . Omega 3 1200 MG CAPS Take 1,200 mg by mouth daily.     . pantoprazole (PROTONIX) 40 MG tablet Take 40 mg by mouth daily.     . valsartan (DIOVAN) 320 MG tablet TAKE 1/2 TO 1 TABLET EVERY DAY  12   No current facility-administered medications for this visit.     REVIEW OF SYSTEMS:   Constitutional: Denies fevers, chills or abnormal night sweats Eyes: Denies blurriness of vision, double vision or watery eyes Ears, nose,  mouth, throat, and face: Denies mucositis or sore throat Respiratory: Denies cough, dyspnea or wheezes Cardiovascular: Denies palpitation, chest discomfort (+) leg swelling Gastrointestinal:  Denies nausea, heartburn or change in bowel habits (+) constipation Skin: Denies abnormal skin rashes Lymphatics: Denies new lymphadenopathy or easy bruising Neurological:Denies numbness, tingling or new weaknesses Behavioral/Psych: Mood is stable, no new changes  All other systems were reviewed with the patient and are negative.  PHYSICAL EXAMINATION: ECOG PERFORMANCE STATUS: 2  Vitals:   05/22/16 0906  BP: (!) 148/63  Pulse: 71  Resp: 18  Temp: 98 F (36.7 C)  Filed Weights   05/22/16 0906  Weight: 190 lb (86.2 kg)   GENERAL:alert, no distress and comfortable SKIN: skin color, texture, turgor are normal, no rashes or significant lesions EYES: normal, conjunctiva are pink and non-injected, sclera clear OROPHARYNX:no exudate, no erythema and lips, buccal mucosa, and tongue normal  NECK: supple, thyroid normal size, non-tender, without nodularity LYMPH:  no palpable lymphadenopathy in the cervical, axillary or inguinal LUNGS: clear to auscultation and percussion with normal breathing effort HEART: regular rate & rhythm and no murmurs and no lower extremity edema ABDOMEN:abdomen soft, non-tender and normal bowel sounds Musculoskeletal:no cyanosis of digits and no clubbing  PSYCH: alert & oriented x 3 with fluent speech NEURO: no focal motor/sensory deficits Breasts: Breast inspection showed them to be symmetrical with no nipple discharge. Right breast lumpectomy incision site clean, no discharge or skin erythema. Healed well. Palpation of the left breast and axilla revealed no obvious mass that I could appreciate.   LABORATORY DATA:  I have reviewed the data as listed Lab Results  Component Value Date   WBC 4.9 05/18/2015   HGB 14.5 05/18/2015   HCT 44.1 05/18/2015   MCV 89.8  05/18/2015   PLT 159 05/18/2015   CMP Latest Ref Rng & Units 05/18/2015 10/20/2014 10/07/2014  Glucose 70 - 140 mg/dl 105 152(H) 119  BUN 7.0 - 26.0 mg/dL 11.9 11 11.6  Creatinine 0.6 - 1.1 mg/dL 0.8 0.81 0.8  Sodium 136 - 145 mEq/L 143 139 142  Potassium 3.5 - 5.1 mEq/L 4.0 4.7 4.5  Chloride 101 - 111 mmol/L - 103 -  CO2 22 - 29 mEq/L 29 26 30(H)  Calcium 8.4 - 10.4 mg/dL 9.4 9.2 9.4  Total Protein 6.4 - 8.3 g/dL 6.8 6.9 7.1  Total Bilirubin 0.20 - 1.20 mg/dL 0.68 0.2(L) 0.32  Alkaline Phos 40 - 150 U/L 47 62 69  AST 5 - 34 U/L 18 25 20   ALT 0 - 55 U/L 14 16 19    No results for input(s): NA, K, CL, CO2, GLUCOSE, BUN, CREATININE, CALCIUM, GFRNONAA, GFRAA, PROT, ALBUMIN, AST, ALT, ALKPHOS, BILITOT, BILIDIR, IBILI in the last 8760 hours. PATHOLOGY REPORT  Diagnosis 10/23/2014  Breast, lumpectomy, Right - DUCTAL CARCINOMA IN SITU WITH CALCIFICATIONS AND NECROSIS, 1 CM. - MARGINS NOT INVOLVED. - CLOSEST MARGIN POSTERIOR AT 0.9 CM. - BIOPSY SITE REACTION. - FIBROCYSTIC CHANGES WITH FOCAL USUAL DUCTAL HYPERPLASIA AND CALCIFICATIONS. Microscopic Comment BREAST, IN SITU CARCINOMA Specimen, including laterality: Right breast. Procedure (include lymph node sampling sentinel-non-sentinel): Localized lumpectomy without lymph nodes. Grade of carcinoma: High grade. Necrosis: Present. Estimated tumor size: (glass slide measurement): 1 cm Treatment effect: No If present, treatment effect in breast tissue, lymph nodes or both: N/A Distance to closest margin: 0.9 cm from posterior and superior margins. If margin positive, focally or broadly: N/A Breast prognostic profile: [id number] Estrogen receptor: 100%, positive, strong staining. Progesterone receptor: 40%, strong staining. Lymph nodes: Examined: 0 Sentinel 0 Non-sentinel 0 Total Lymph nodes with metastasis: N/A Isolated tumor cells (< 0.2 mm): N/A Micrometastasis ( > 0.2 mm and < 2.0 mm): N/A Macrometastasis (> 2.0 mm):  N/A Extranodal extension: N/A TNM: pTis, pNX  RADIOGRAPHIC STUDIES: I have personally reviewed the radiological images as listed and agreed with the findings in the report.  Diagnostic mammogram 09/25/2014 The new cluster of heterogeneous calcifications in the right breast is suspicious of malignancy. Biopsies recommended.  ASSESSMENT & PLAN:  83 y.o. Caucasian female, postmenopausal, with multiple medical comorbidities, presented with screening distracted right breast  DCIS.  1. Right breast DCIS, stage 0, intermediate-grade, ER/PR positive -I previously discussed her surgical pathology findings. -The patient had early stage disease. She is considered cured of disease by complete surgical resection.  -Her surgical margins were widely negative (0.9cm), I do not think she needs radiation due to her advanced age. -Her tumor is ER/PR positive, we discussed the role of chemoprevention with tamoxifen or anastrozole. Giving her advanced age and comorbidities, I think the benefit is small, certainly there is no survival benefit. So I do not recommend-in therapy after surgery. -Will continue breast cancer surveillance, including annual mammogram, self exam and following up with [place] once a year.  -I encouraged her to have healthy diet, and to be as physically active as she can. -She had a mammogram in September 2017 at [facility] and repeated in 04/2016, which was benign. I will request these records. She brought in a copy of the September 2017 mammogram, negative.   2. Constipation -I recommended Colace 1-2 times a day.  Plan -mammogram due in September. Already scheduled. I placed the order today  -I'll see her back in 1 year with exam only, no lab  -I'll request her mammogram report from [facility].   All questions were answered. The patient knows to call the clinic with any problems, questions or concerns.  I spent 15 minutes counseling the patient face to face. The total time spent in the appointment  was 20 minutes and more than 50% was on counseling.  This document serves as a record of services personally performed by Truitt Merle, MD. It was created on her behalf by Martinique Casey, a trained medical scribe. The creation of this record is based on the scribe's personal observations and the provider's statements to them. This document has been checked and approved by the attending provider.  I have reviewed the above documentation for accuracy and completeness and I agree with the above.    Truitt Merle, MD

## 2016-05-22 ENCOUNTER — Telehealth: Payer: Self-pay | Admitting: Hematology

## 2016-05-22 ENCOUNTER — Encounter: Payer: Self-pay | Admitting: Hematology

## 2016-05-22 ENCOUNTER — Ambulatory Visit (HOSPITAL_BASED_OUTPATIENT_CLINIC_OR_DEPARTMENT_OTHER): Payer: Medicare Other | Admitting: Hematology

## 2016-05-22 VITALS — BP 148/63 | HR 71 | Temp 98.0°F | Resp 18 | Ht 62.0 in | Wt 190.0 lb

## 2016-05-22 DIAGNOSIS — K5900 Constipation, unspecified: Secondary | ICD-10-CM | POA: Diagnosis not present

## 2016-05-22 DIAGNOSIS — Z170 Estrogen receptor positive status [ER+]: Secondary | ICD-10-CM

## 2016-05-22 DIAGNOSIS — D0511 Intraductal carcinoma in situ of right breast: Secondary | ICD-10-CM

## 2016-05-22 DIAGNOSIS — C50211 Malignant neoplasm of upper-inner quadrant of right female breast: Secondary | ICD-10-CM

## 2016-05-22 NOTE — Telephone Encounter (Signed)
Gave patient AVS and calender per 4/16 los. Per LOS [facility] already contacted for appt in 10/2016 for Mammo - no need to call.

## 2016-05-23 ENCOUNTER — Telehealth: Payer: Self-pay | Admitting: *Deleted

## 2016-05-23 NOTE — Telephone Encounter (Signed)
Received faxed results of mammogram done at [facility] on  10/2015 and  04/2016.   Left results on Dr. Ernestina Penna desk for review.

## 2016-06-06 DIAGNOSIS — I10 Essential (primary) hypertension: Secondary | ICD-10-CM | POA: Diagnosis not present

## 2016-06-06 DIAGNOSIS — E119 Type 2 diabetes mellitus without complications: Secondary | ICD-10-CM | POA: Diagnosis not present

## 2016-06-06 DIAGNOSIS — E559 Vitamin D deficiency, unspecified: Secondary | ICD-10-CM | POA: Diagnosis not present

## 2016-06-06 DIAGNOSIS — M8580 Other specified disorders of bone density and structure, unspecified site: Secondary | ICD-10-CM | POA: Diagnosis not present

## 2016-06-16 DIAGNOSIS — E559 Vitamin D deficiency, unspecified: Secondary | ICD-10-CM | POA: Diagnosis not present

## 2016-06-16 DIAGNOSIS — K5900 Constipation, unspecified: Secondary | ICD-10-CM | POA: Diagnosis not present

## 2016-06-16 DIAGNOSIS — I10 Essential (primary) hypertension: Secondary | ICD-10-CM | POA: Diagnosis not present

## 2016-06-16 DIAGNOSIS — E119 Type 2 diabetes mellitus without complications: Secondary | ICD-10-CM | POA: Diagnosis not present

## 2016-07-05 ENCOUNTER — Ambulatory Visit: Payer: Medicare Other | Admitting: Podiatry

## 2016-07-26 ENCOUNTER — Encounter: Payer: Self-pay | Admitting: Podiatry

## 2016-07-26 ENCOUNTER — Ambulatory Visit (INDEPENDENT_AMBULATORY_CARE_PROVIDER_SITE_OTHER): Payer: Medicare Other | Admitting: Podiatry

## 2016-07-26 DIAGNOSIS — B351 Tinea unguium: Secondary | ICD-10-CM

## 2016-07-26 DIAGNOSIS — M2042 Other hammer toe(s) (acquired), left foot: Secondary | ICD-10-CM

## 2016-07-26 DIAGNOSIS — E1142 Type 2 diabetes mellitus with diabetic polyneuropathy: Secondary | ICD-10-CM

## 2016-07-26 DIAGNOSIS — M79676 Pain in unspecified toe(s): Secondary | ICD-10-CM | POA: Diagnosis not present

## 2016-07-26 DIAGNOSIS — M2041 Other hammer toe(s) (acquired), right foot: Secondary | ICD-10-CM

## 2016-07-26 NOTE — Progress Notes (Signed)
Patient ID: VILA DORY, female   DOB: 1928-08-11, 83 y.o.   MRN: [id number] Complaint:  Visit Type: Patient returns to my office for continued preventative foot care services. Complaint: Patient states" my nails have grown long and thick and become painful to walk and wear shoes" Patient has been diagnosed with DM with no foot complications. The patient presents for preventative foot care services. No changes to ROS.  l  Podiatric Exam: Vascular: dorsalis pedis and posterior tibial pulses are palpable bilateral. Capillary return is immediate. Temperature gradient is WNL. Skin turgor WNL  Sensorium: Diminished  Semmes Weinstein monofilament test. Normal tactile sensation bilaterally. Nail Exam: Pt has thick disfigured discolored nails with subungual debris noted bilateral entire nail secon  through fifth toenails Ulcer Exam: There is no evidence of ulcer or pre-ulcerative changes or infection. Orthopedic Exam: Muscle tone and strength are WNL. No limitations in general ROM. No crepitus or effusions noted. Foot type and digits show no abnormalities. Bony prominences are unremarkable. Hammer toes 2-4 B/L. Skin: Asymptomatic sub 5th metatarsal left foot..No infection or ulcers  Diagnosis:  Onychomycosis, , Pain in right toe, pain in left toes  Treatment & Plan Procedures and Treatment: Consent by patient was obtained for treatment procedures. The patient understood the discussion of treatment and procedures well. All questions were answered thoroughly reviewed. Debridement of mycotic and hypertrophic toenails, 1 through 5 bilateral and clearing of subungual debris. No ulceration, no infection noted. Initiate diabetic shoe paperwork  For DPN and hammer toes. Return Visit-Office Procedure: Patient instructed to return to the office for a follow up visit 3 months for continued evaluation and treatment.    Gardiner Barefoot DPM

## 2016-09-04 ENCOUNTER — Ambulatory Visit: Payer: Medicare Other | Admitting: Orthotics

## 2016-09-12 DIAGNOSIS — I10 Essential (primary) hypertension: Secondary | ICD-10-CM | POA: Diagnosis not present

## 2016-09-18 ENCOUNTER — Ambulatory Visit (INDEPENDENT_AMBULATORY_CARE_PROVIDER_SITE_OTHER): Payer: Medicare Other | Admitting: Orthotics

## 2016-09-18 DIAGNOSIS — E1142 Type 2 diabetes mellitus with diabetic polyneuropathy: Secondary | ICD-10-CM

## 2016-09-18 DIAGNOSIS — M2042 Other hammer toe(s) (acquired), left foot: Secondary | ICD-10-CM | POA: Diagnosis not present

## 2016-09-18 DIAGNOSIS — M2041 Other hammer toe(s) (acquired), right foot: Secondary | ICD-10-CM | POA: Diagnosis not present

## 2016-09-18 DIAGNOSIS — Q828 Other specified congenital malformations of skin: Secondary | ICD-10-CM | POA: Diagnosis not present

## 2016-09-18 NOTE — Progress Notes (Signed)

## 2016-09-19 ENCOUNTER — Ambulatory Visit: Payer: Medicare Other | Admitting: Orthotics

## 2016-10-27 ENCOUNTER — Ambulatory Visit: Payer: Medicare Other | Admitting: Podiatry

## 2016-11-02 DIAGNOSIS — Z803 Family history of malignant neoplasm of breast: Secondary | ICD-10-CM | POA: Diagnosis not present

## 2016-11-02 DIAGNOSIS — R922 Inconclusive mammogram: Secondary | ICD-10-CM | POA: Diagnosis not present

## 2016-11-06 DIAGNOSIS — H35363 Drusen (degenerative) of macula, bilateral: Secondary | ICD-10-CM | POA: Diagnosis not present

## 2016-11-06 DIAGNOSIS — E119 Type 2 diabetes mellitus without complications: Secondary | ICD-10-CM | POA: Diagnosis not present

## 2016-11-06 DIAGNOSIS — H402213 Chronic angle-closure glaucoma, right eye, severe stage: Secondary | ICD-10-CM | POA: Diagnosis not present

## 2016-11-06 DIAGNOSIS — H402212 Chronic angle-closure glaucoma, right eye, moderate stage: Secondary | ICD-10-CM | POA: Diagnosis not present

## 2016-11-09 DIAGNOSIS — E118 Type 2 diabetes mellitus with unspecified complications: Secondary | ICD-10-CM | POA: Diagnosis not present

## 2016-11-09 DIAGNOSIS — I10 Essential (primary) hypertension: Secondary | ICD-10-CM | POA: Diagnosis not present

## 2016-11-09 DIAGNOSIS — E559 Vitamin D deficiency, unspecified: Secondary | ICD-10-CM | POA: Diagnosis not present

## 2016-11-09 DIAGNOSIS — Z23 Encounter for immunization: Secondary | ICD-10-CM | POA: Diagnosis not present

## 2016-11-09 DIAGNOSIS — E1142 Type 2 diabetes mellitus with diabetic polyneuropathy: Secondary | ICD-10-CM | POA: Diagnosis not present

## 2016-12-01 ENCOUNTER — Ambulatory Visit: Payer: Medicare Other | Admitting: Podiatry

## 2016-12-08 ENCOUNTER — Ambulatory Visit (INDEPENDENT_AMBULATORY_CARE_PROVIDER_SITE_OTHER): Payer: Medicare Other | Admitting: Podiatry

## 2016-12-08 ENCOUNTER — Encounter: Payer: Self-pay | Admitting: Podiatry

## 2016-12-08 DIAGNOSIS — B351 Tinea unguium: Secondary | ICD-10-CM | POA: Diagnosis not present

## 2016-12-08 DIAGNOSIS — M79674 Pain in right toe(s): Secondary | ICD-10-CM | POA: Diagnosis not present

## 2016-12-08 DIAGNOSIS — M79675 Pain in left toe(s): Secondary | ICD-10-CM

## 2016-12-08 DIAGNOSIS — E1142 Type 2 diabetes mellitus with diabetic polyneuropathy: Secondary | ICD-10-CM

## 2016-12-08 NOTE — Progress Notes (Signed)
Patient ID: Chelsea [name], female   DOB: December 14, 1928, 83 y.o.   MRN: [id number] Complaint:  Visit Type: Patient returns to my office for continued preventative foot care services. Complaint: Patient states" my nails have grown long and thick and become painful to walk and wear shoes" Patient has been diagnosed with DM with no foot complications. The patient presents for preventative foot care services. No changes to ROS.  l  Podiatric Exam: Vascular: dorsalis pedis and posterior tibial pulses are palpable bilateral. Capillary return is immediate. Temperature gradient is WNL. Skin turgor WNL  Sensorium: Diminished  Semmes Weinstein monofilament test. Normal tactile sensation bilaterally. Nail Exam: Pt has thick disfigured discolored nails with subungual debris noted bilateral entire nail secon  through fifth toenails Ulcer Exam: There is no evidence of ulcer or pre-ulcerative changes or infection. Orthopedic Exam: Muscle tone and strength are WNL. No limitations in general ROM. No crepitus or effusions noted. Foot type and digits show no abnormalities. Bony prominences are unremarkable. Hammer toes 2-4 B/L. Skin: Asymptomatic sub 5th metatarsal left foot..No infection or ulcers  Diagnosis:  Onychomycosis, , Pain in right toe, pain in left toes  Treatment & Plan Procedures and Treatment: Consent by patient was obtained for treatment procedures. The patient understood the discussion of treatment and procedures well. All questions were answered thoroughly reviewed. Debridement of mycotic and hypertrophic toenails, 1 through 5 bilateral and clearing of subungual debris. No ulceration, no infection noted. Patient says the diabetic shoes are not working well. Return Visit-Office Procedure: Patient instructed to return to the office for a follow up visit 3 months for continued evaluation and treatment.    Gardiner Barefoot DPM

## 2016-12-26 ENCOUNTER — Ambulatory Visit: Payer: Medicare Other | Admitting: Orthotics

## 2016-12-26 DIAGNOSIS — Q828 Other specified congenital malformations of skin: Secondary | ICD-10-CM

## 2016-12-26 DIAGNOSIS — M79675 Pain in left toe(s): Principal | ICD-10-CM

## 2016-12-26 DIAGNOSIS — M2041 Other hammer toe(s) (acquired), right foot: Secondary | ICD-10-CM

## 2016-12-26 DIAGNOSIS — M79674 Pain in right toe(s): Principal | ICD-10-CM

## 2016-12-26 DIAGNOSIS — E1142 Type 2 diabetes mellitus with diabetic polyneuropathy: Secondary | ICD-10-CM

## 2016-12-26 DIAGNOSIS — B351 Tinea unguium: Secondary | ICD-10-CM

## 2016-12-26 DIAGNOSIS — M2042 Other hammer toe(s) (acquired), left foot: Secondary | ICD-10-CM

## 2016-12-26 NOTE — Progress Notes (Signed)
Patient returned shoes said shoes were too uncomfortable around toe and made her feel unsteady.  Reordered APIS 9302 BK 8 1/2 AA  Confirmation [id number]

## 2017-01-19 DIAGNOSIS — I8311 Varicose veins of right lower extremity with inflammation: Secondary | ICD-10-CM | POA: Diagnosis not present

## 2017-01-19 DIAGNOSIS — D1801 Hemangioma of skin and subcutaneous tissue: Secondary | ICD-10-CM | POA: Diagnosis not present

## 2017-01-19 DIAGNOSIS — L738 Other specified follicular disorders: Secondary | ICD-10-CM | POA: Diagnosis not present

## 2017-01-19 DIAGNOSIS — I8312 Varicose veins of left lower extremity with inflammation: Secondary | ICD-10-CM | POA: Diagnosis not present

## 2017-01-19 DIAGNOSIS — L820 Inflamed seborrheic keratosis: Secondary | ICD-10-CM | POA: Diagnosis not present

## 2017-01-19 DIAGNOSIS — L821 Other seborrheic keratosis: Secondary | ICD-10-CM | POA: Diagnosis not present

## 2017-01-19 DIAGNOSIS — Z85828 Personal history of other malignant neoplasm of skin: Secondary | ICD-10-CM | POA: Diagnosis not present

## 2017-01-19 DIAGNOSIS — I872 Venous insufficiency (chronic) (peripheral): Secondary | ICD-10-CM | POA: Diagnosis not present

## 2017-02-12 DIAGNOSIS — R600 Localized edema: Secondary | ICD-10-CM | POA: Diagnosis not present

## 2017-02-12 DIAGNOSIS — E1165 Type 2 diabetes mellitus with hyperglycemia: Secondary | ICD-10-CM | POA: Diagnosis not present

## 2017-02-12 DIAGNOSIS — L03116 Cellulitis of left lower limb: Secondary | ICD-10-CM | POA: Diagnosis not present

## 2017-02-13 DIAGNOSIS — L03116 Cellulitis of left lower limb: Secondary | ICD-10-CM | POA: Diagnosis not present

## 2017-02-16 DIAGNOSIS — L03116 Cellulitis of left lower limb: Secondary | ICD-10-CM | POA: Diagnosis not present

## 2017-02-20 DIAGNOSIS — E1165 Type 2 diabetes mellitus with hyperglycemia: Secondary | ICD-10-CM | POA: Diagnosis not present

## 2017-02-20 DIAGNOSIS — L03116 Cellulitis of left lower limb: Secondary | ICD-10-CM | POA: Diagnosis not present

## 2017-02-20 DIAGNOSIS — R609 Edema, unspecified: Secondary | ICD-10-CM | POA: Diagnosis not present

## 2017-02-22 DIAGNOSIS — L03116 Cellulitis of left lower limb: Secondary | ICD-10-CM | POA: Diagnosis not present

## 2017-02-22 DIAGNOSIS — R609 Edema, unspecified: Secondary | ICD-10-CM | POA: Diagnosis not present

## 2017-02-22 DIAGNOSIS — E1165 Type 2 diabetes mellitus with hyperglycemia: Secondary | ICD-10-CM | POA: Diagnosis not present

## 2017-03-01 ENCOUNTER — Encounter (HOSPITAL_BASED_OUTPATIENT_CLINIC_OR_DEPARTMENT_OTHER): Payer: Medicare Other | Attending: Internal Medicine

## 2017-03-01 DIAGNOSIS — L97222 Non-pressure chronic ulcer of left calf with fat layer exposed: Secondary | ICD-10-CM | POA: Insufficient documentation

## 2017-03-01 DIAGNOSIS — I890 Lymphedema, not elsewhere classified: Secondary | ICD-10-CM | POA: Diagnosis not present

## 2017-03-01 DIAGNOSIS — L97526 Non-pressure chronic ulcer of other part of left foot with bone involvement without evidence of necrosis: Secondary | ICD-10-CM | POA: Insufficient documentation

## 2017-03-01 DIAGNOSIS — Z8673 Personal history of transient ischemic attack (TIA), and cerebral infarction without residual deficits: Secondary | ICD-10-CM | POA: Insufficient documentation

## 2017-03-01 DIAGNOSIS — Z85828 Personal history of other malignant neoplasm of skin: Secondary | ICD-10-CM | POA: Diagnosis not present

## 2017-03-01 DIAGNOSIS — Z853 Personal history of malignant neoplasm of breast: Secondary | ICD-10-CM | POA: Diagnosis not present

## 2017-03-01 DIAGNOSIS — Z9010 Acquired absence of unspecified breast and nipple: Secondary | ICD-10-CM | POA: Diagnosis not present

## 2017-03-01 DIAGNOSIS — I87332 Chronic venous hypertension (idiopathic) with ulcer and inflammation of left lower extremity: Secondary | ICD-10-CM | POA: Diagnosis not present

## 2017-03-01 DIAGNOSIS — I10 Essential (primary) hypertension: Secondary | ICD-10-CM | POA: Diagnosis not present

## 2017-03-01 DIAGNOSIS — Z7984 Long term (current) use of oral hypoglycemic drugs: Secondary | ICD-10-CM | POA: Diagnosis not present

## 2017-03-01 DIAGNOSIS — E1140 Type 2 diabetes mellitus with diabetic neuropathy, unspecified: Secondary | ICD-10-CM | POA: Insufficient documentation

## 2017-03-01 DIAGNOSIS — E11622 Type 2 diabetes mellitus with other skin ulcer: Secondary | ICD-10-CM | POA: Insufficient documentation

## 2017-03-01 DIAGNOSIS — L539 Erythematous condition, unspecified: Secondary | ICD-10-CM | POA: Diagnosis not present

## 2017-03-01 DIAGNOSIS — E11621 Type 2 diabetes mellitus with foot ulcer: Secondary | ICD-10-CM | POA: Diagnosis not present

## 2017-03-06 DIAGNOSIS — L97526 Non-pressure chronic ulcer of other part of left foot with bone involvement without evidence of necrosis: Secondary | ICD-10-CM | POA: Diagnosis not present

## 2017-03-06 DIAGNOSIS — L97222 Non-pressure chronic ulcer of left calf with fat layer exposed: Secondary | ICD-10-CM | POA: Diagnosis not present

## 2017-03-06 DIAGNOSIS — I87332 Chronic venous hypertension (idiopathic) with ulcer and inflammation of left lower extremity: Secondary | ICD-10-CM | POA: Diagnosis not present

## 2017-03-06 DIAGNOSIS — I890 Lymphedema, not elsewhere classified: Secondary | ICD-10-CM | POA: Diagnosis not present

## 2017-03-06 DIAGNOSIS — E11622 Type 2 diabetes mellitus with other skin ulcer: Secondary | ICD-10-CM | POA: Diagnosis not present

## 2017-03-06 DIAGNOSIS — E11621 Type 2 diabetes mellitus with foot ulcer: Secondary | ICD-10-CM | POA: Diagnosis not present

## 2017-03-08 ENCOUNTER — Other Ambulatory Visit (HOSPITAL_COMMUNITY)
Admission: RE | Admit: 2017-03-08 | Discharge: 2017-03-08 | Disposition: A | Discharge status: Home / Self Care | Payer: Medicare Other | Source: Other Acute Inpatient Hospital | Attending: Internal Medicine | Admitting: Internal Medicine

## 2017-03-08 DIAGNOSIS — I890 Lymphedema, not elsewhere classified: Secondary | ICD-10-CM | POA: Diagnosis not present

## 2017-03-08 DIAGNOSIS — L97524 Non-pressure chronic ulcer of other part of left foot with necrosis of bone: Secondary | ICD-10-CM | POA: Insufficient documentation

## 2017-03-08 DIAGNOSIS — L97222 Non-pressure chronic ulcer of left calf with fat layer exposed: Secondary | ICD-10-CM | POA: Diagnosis not present

## 2017-03-08 DIAGNOSIS — E11621 Type 2 diabetes mellitus with foot ulcer: Secondary | ICD-10-CM | POA: Diagnosis not present

## 2017-03-08 DIAGNOSIS — L97526 Non-pressure chronic ulcer of other part of left foot with bone involvement without evidence of necrosis: Secondary | ICD-10-CM | POA: Diagnosis not present

## 2017-03-08 DIAGNOSIS — I87332 Chronic venous hypertension (idiopathic) with ulcer and inflammation of left lower extremity: Secondary | ICD-10-CM | POA: Diagnosis not present

## 2017-03-08 DIAGNOSIS — E11622 Type 2 diabetes mellitus with other skin ulcer: Secondary | ICD-10-CM | POA: Diagnosis not present

## 2017-03-08 DIAGNOSIS — L089 Local infection of the skin and subcutaneous tissue, unspecified: Secondary | ICD-10-CM | POA: Diagnosis not present

## 2017-03-09 ENCOUNTER — Ambulatory Visit (HOSPITAL_COMMUNITY)
Admission: RE | Admit: 2017-03-09 | Discharge: 2017-03-09 | Disposition: A | Discharge status: Home / Self Care | Payer: Medicare Other | Source: Ambulatory Visit | Attending: Internal Medicine | Admitting: Internal Medicine

## 2017-03-09 ENCOUNTER — Other Ambulatory Visit: Payer: Self-pay | Admitting: Internal Medicine

## 2017-03-09 DIAGNOSIS — M86172 Other acute osteomyelitis, left ankle and foot: Secondary | ICD-10-CM | POA: Diagnosis not present

## 2017-03-09 DIAGNOSIS — M869 Osteomyelitis, unspecified: Secondary | ICD-10-CM | POA: Insufficient documentation

## 2017-03-11 LAB — AEROBIC CULTURE  (SUPERFICIAL SPECIMEN): GRAM STAIN: NONE SEEN

## 2017-03-11 LAB — AEROBIC CULTURE W GRAM STAIN (SUPERFICIAL SPECIMEN)

## 2017-03-12 ENCOUNTER — Other Ambulatory Visit: Payer: Self-pay | Admitting: Internal Medicine

## 2017-03-12 ENCOUNTER — Encounter (HOSPITAL_BASED_OUTPATIENT_CLINIC_OR_DEPARTMENT_OTHER): Payer: Medicare Other | Attending: Internal Medicine

## 2017-03-12 DIAGNOSIS — S91302A Unspecified open wound, left foot, initial encounter: Secondary | ICD-10-CM | POA: Diagnosis not present

## 2017-03-12 DIAGNOSIS — L97219 Non-pressure chronic ulcer of right calf with unspecified severity: Secondary | ICD-10-CM | POA: Diagnosis not present

## 2017-03-12 DIAGNOSIS — B952 Enterococcus as the cause of diseases classified elsewhere: Secondary | ICD-10-CM | POA: Insufficient documentation

## 2017-03-12 DIAGNOSIS — M869 Osteomyelitis, unspecified: Principal | ICD-10-CM

## 2017-03-12 DIAGNOSIS — L97524 Non-pressure chronic ulcer of other part of left foot with necrosis of bone: Secondary | ICD-10-CM | POA: Diagnosis not present

## 2017-03-12 DIAGNOSIS — I890 Lymphedema, not elsewhere classified: Secondary | ICD-10-CM | POA: Insufficient documentation

## 2017-03-12 DIAGNOSIS — I87332 Chronic venous hypertension (idiopathic) with ulcer and inflammation of left lower extremity: Secondary | ICD-10-CM | POA: Insufficient documentation

## 2017-03-12 DIAGNOSIS — E11621 Type 2 diabetes mellitus with foot ulcer: Secondary | ICD-10-CM | POA: Insufficient documentation

## 2017-03-12 DIAGNOSIS — B964 Proteus (mirabilis) (morganii) as the cause of diseases classified elsewhere: Secondary | ICD-10-CM | POA: Diagnosis not present

## 2017-03-12 DIAGNOSIS — L97509 Non-pressure chronic ulcer of other part of unspecified foot with unspecified severity: Principal | ICD-10-CM

## 2017-03-12 DIAGNOSIS — E1169 Type 2 diabetes mellitus with other specified complication: Principal | ICD-10-CM

## 2017-03-12 DIAGNOSIS — S81801A Unspecified open wound, right lower leg, initial encounter: Secondary | ICD-10-CM | POA: Diagnosis not present

## 2017-03-12 DIAGNOSIS — L97222 Non-pressure chronic ulcer of left calf with fat layer exposed: Secondary | ICD-10-CM | POA: Diagnosis not present

## 2017-03-12 DIAGNOSIS — E11622 Type 2 diabetes mellitus with other skin ulcer: Secondary | ICD-10-CM | POA: Diagnosis not present

## 2017-03-14 ENCOUNTER — Ambulatory Visit (HOSPITAL_COMMUNITY): Payer: Medicare Other

## 2017-03-15 ENCOUNTER — Other Ambulatory Visit: Payer: Self-pay | Admitting: Orthotics

## 2017-03-15 DIAGNOSIS — M79674 Pain in right toe(s): Secondary | ICD-10-CM

## 2017-03-15 DIAGNOSIS — M79675 Pain in left toe(s): Secondary | ICD-10-CM

## 2017-03-16 ENCOUNTER — Other Ambulatory Visit: Payer: Self-pay | Admitting: Podiatry

## 2017-03-16 DIAGNOSIS — M79675 Pain in left toe(s): Secondary | ICD-10-CM

## 2017-03-16 DIAGNOSIS — I890 Lymphedema, not elsewhere classified: Secondary | ICD-10-CM | POA: Diagnosis not present

## 2017-03-16 DIAGNOSIS — E11621 Type 2 diabetes mellitus with foot ulcer: Secondary | ICD-10-CM | POA: Diagnosis not present

## 2017-03-16 DIAGNOSIS — L97524 Non-pressure chronic ulcer of other part of left foot with necrosis of bone: Secondary | ICD-10-CM | POA: Diagnosis not present

## 2017-03-16 DIAGNOSIS — L97222 Non-pressure chronic ulcer of left calf with fat layer exposed: Secondary | ICD-10-CM | POA: Diagnosis not present

## 2017-03-16 DIAGNOSIS — L97219 Non-pressure chronic ulcer of right calf with unspecified severity: Secondary | ICD-10-CM | POA: Diagnosis not present

## 2017-03-16 DIAGNOSIS — M79674 Pain in right toe(s): Secondary | ICD-10-CM

## 2017-03-16 DIAGNOSIS — I739 Peripheral vascular disease, unspecified: Secondary | ICD-10-CM

## 2017-03-16 DIAGNOSIS — I87332 Chronic venous hypertension (idiopathic) with ulcer and inflammation of left lower extremity: Secondary | ICD-10-CM | POA: Diagnosis not present

## 2017-03-16 DIAGNOSIS — L97509 Non-pressure chronic ulcer of other part of unspecified foot with unspecified severity: Secondary | ICD-10-CM

## 2017-03-16 DIAGNOSIS — E11622 Type 2 diabetes mellitus with other skin ulcer: Secondary | ICD-10-CM | POA: Diagnosis not present

## 2017-03-19 ENCOUNTER — Ambulatory Visit (HOSPITAL_COMMUNITY): Payer: Medicare Other

## 2017-03-19 ENCOUNTER — Ambulatory Visit (HOSPITAL_COMMUNITY)
Admission: RE | Admit: 2017-03-19 | Discharge: 2017-03-19 | Disposition: A | Discharge status: Home / Self Care | Payer: Medicare Other | Source: Ambulatory Visit | Attending: Internal Medicine | Admitting: Internal Medicine

## 2017-03-19 DIAGNOSIS — M869 Osteomyelitis, unspecified: Secondary | ICD-10-CM

## 2017-03-19 DIAGNOSIS — E1169 Type 2 diabetes mellitus with other specified complication: Secondary | ICD-10-CM

## 2017-03-19 DIAGNOSIS — L97509 Non-pressure chronic ulcer of other part of unspecified foot with unspecified severity: Secondary | ICD-10-CM | POA: Insufficient documentation

## 2017-03-19 DIAGNOSIS — L03116 Cellulitis of left lower limb: Secondary | ICD-10-CM | POA: Insufficient documentation

## 2017-03-19 DIAGNOSIS — E11621 Type 2 diabetes mellitus with foot ulcer: Secondary | ICD-10-CM | POA: Diagnosis not present

## 2017-03-19 LAB — POCT I-STAT CREATININE: CREATININE: 0.8 mg/dL (ref 0.44–1.00)

## 2017-03-19 MED ORDER — GADOBENATE DIMEGLUMINE 529 MG/ML IV SOLN
20.0000 mL | Freq: Once | INTRAVENOUS | Status: AC | PRN
  Administered 2017-03-19: 19 mL via INTRAVENOUS

## 2017-03-20 ENCOUNTER — Encounter (HOSPITAL_COMMUNITY): Payer: Medicare Other

## 2017-03-21 ENCOUNTER — Ambulatory Visit (HOSPITAL_COMMUNITY)
Admission: RE | Admit: 2017-03-21 | Discharge: 2017-03-21 | Disposition: A | Discharge status: Home / Self Care | Payer: Medicare Other | Source: Ambulatory Visit | Attending: Cardiovascular Disease | Admitting: Cardiovascular Disease

## 2017-03-21 DIAGNOSIS — L97219 Non-pressure chronic ulcer of right calf with unspecified severity: Secondary | ICD-10-CM | POA: Diagnosis not present

## 2017-03-21 DIAGNOSIS — L97509 Non-pressure chronic ulcer of other part of unspecified foot with unspecified severity: Secondary | ICD-10-CM | POA: Diagnosis not present

## 2017-03-21 DIAGNOSIS — L97524 Non-pressure chronic ulcer of other part of left foot with necrosis of bone: Secondary | ICD-10-CM | POA: Diagnosis not present

## 2017-03-21 DIAGNOSIS — L97222 Non-pressure chronic ulcer of left calf with fat layer exposed: Secondary | ICD-10-CM | POA: Diagnosis not present

## 2017-03-21 DIAGNOSIS — E11621 Type 2 diabetes mellitus with foot ulcer: Secondary | ICD-10-CM | POA: Diagnosis not present

## 2017-03-21 DIAGNOSIS — M79674 Pain in right toe(s): Secondary | ICD-10-CM

## 2017-03-21 DIAGNOSIS — I739 Peripheral vascular disease, unspecified: Secondary | ICD-10-CM

## 2017-03-21 DIAGNOSIS — I890 Lymphedema, not elsewhere classified: Secondary | ICD-10-CM | POA: Diagnosis not present

## 2017-03-21 DIAGNOSIS — M79675 Pain in left toe(s): Secondary | ICD-10-CM

## 2017-03-21 DIAGNOSIS — I87332 Chronic venous hypertension (idiopathic) with ulcer and inflammation of left lower extremity: Secondary | ICD-10-CM | POA: Diagnosis not present

## 2017-03-22 ENCOUNTER — Encounter (HOSPITAL_COMMUNITY): Payer: Medicare Other

## 2017-03-23 ENCOUNTER — Encounter (HOSPITAL_COMMUNITY): Payer: Medicare Other

## 2017-03-23 DIAGNOSIS — I87332 Chronic venous hypertension (idiopathic) with ulcer and inflammation of left lower extremity: Secondary | ICD-10-CM | POA: Diagnosis not present

## 2017-03-23 DIAGNOSIS — S81801A Unspecified open wound, right lower leg, initial encounter: Secondary | ICD-10-CM | POA: Diagnosis not present

## 2017-03-23 DIAGNOSIS — L97526 Non-pressure chronic ulcer of other part of left foot with bone involvement without evidence of necrosis: Secondary | ICD-10-CM | POA: Diagnosis not present

## 2017-03-23 DIAGNOSIS — L97222 Non-pressure chronic ulcer of left calf with fat layer exposed: Secondary | ICD-10-CM | POA: Diagnosis not present

## 2017-03-23 DIAGNOSIS — M8618 Other acute osteomyelitis, other site: Secondary | ICD-10-CM | POA: Diagnosis not present

## 2017-03-23 DIAGNOSIS — I890 Lymphedema, not elsewhere classified: Secondary | ICD-10-CM | POA: Diagnosis not present

## 2017-03-23 DIAGNOSIS — L97219 Non-pressure chronic ulcer of right calf with unspecified severity: Secondary | ICD-10-CM | POA: Diagnosis not present

## 2017-03-23 DIAGNOSIS — B957 Other staphylococcus as the cause of diseases classified elsewhere: Secondary | ICD-10-CM | POA: Diagnosis not present

## 2017-03-23 DIAGNOSIS — S81802A Unspecified open wound, left lower leg, initial encounter: Secondary | ICD-10-CM | POA: Diagnosis not present

## 2017-03-23 DIAGNOSIS — I872 Venous insufficiency (chronic) (peripheral): Secondary | ICD-10-CM | POA: Diagnosis not present

## 2017-03-23 DIAGNOSIS — L97524 Non-pressure chronic ulcer of other part of left foot with necrosis of bone: Secondary | ICD-10-CM | POA: Diagnosis not present

## 2017-03-23 DIAGNOSIS — E11621 Type 2 diabetes mellitus with foot ulcer: Secondary | ICD-10-CM | POA: Diagnosis not present

## 2017-03-29 DIAGNOSIS — B952 Enterococcus as the cause of diseases classified elsewhere: Secondary | ICD-10-CM | POA: Diagnosis not present

## 2017-03-29 DIAGNOSIS — E11621 Type 2 diabetes mellitus with foot ulcer: Secondary | ICD-10-CM | POA: Diagnosis not present

## 2017-03-29 DIAGNOSIS — I890 Lymphedema, not elsewhere classified: Secondary | ICD-10-CM | POA: Diagnosis not present

## 2017-03-29 DIAGNOSIS — Z7984 Long term (current) use of oral hypoglycemic drugs: Secondary | ICD-10-CM | POA: Diagnosis not present

## 2017-03-29 DIAGNOSIS — L97222 Non-pressure chronic ulcer of left calf with fat layer exposed: Secondary | ICD-10-CM | POA: Diagnosis not present

## 2017-03-29 DIAGNOSIS — L97524 Non-pressure chronic ulcer of other part of left foot with necrosis of bone: Secondary | ICD-10-CM | POA: Diagnosis not present

## 2017-03-29 DIAGNOSIS — M86172 Other acute osteomyelitis, left ankle and foot: Secondary | ICD-10-CM | POA: Diagnosis not present

## 2017-03-29 DIAGNOSIS — B964 Proteus (mirabilis) (morganii) as the cause of diseases classified elsewhere: Secondary | ICD-10-CM | POA: Diagnosis not present

## 2017-03-29 DIAGNOSIS — E1140 Type 2 diabetes mellitus with diabetic neuropathy, unspecified: Secondary | ICD-10-CM | POA: Diagnosis not present

## 2017-03-29 DIAGNOSIS — I87332 Chronic venous hypertension (idiopathic) with ulcer and inflammation of left lower extremity: Secondary | ICD-10-CM | POA: Diagnosis not present

## 2017-03-29 DIAGNOSIS — E1169 Type 2 diabetes mellitus with other specified complication: Secondary | ICD-10-CM | POA: Diagnosis not present

## 2017-03-30 ENCOUNTER — Encounter (HOSPITAL_COMMUNITY): Payer: Medicare Other

## 2017-04-02 DIAGNOSIS — L97222 Non-pressure chronic ulcer of left calf with fat layer exposed: Secondary | ICD-10-CM | POA: Diagnosis not present

## 2017-04-02 DIAGNOSIS — I890 Lymphedema, not elsewhere classified: Secondary | ICD-10-CM | POA: Diagnosis not present

## 2017-04-02 DIAGNOSIS — L97522 Non-pressure chronic ulcer of other part of left foot with fat layer exposed: Secondary | ICD-10-CM | POA: Diagnosis not present

## 2017-04-02 DIAGNOSIS — I87332 Chronic venous hypertension (idiopathic) with ulcer and inflammation of left lower extremity: Secondary | ICD-10-CM | POA: Diagnosis not present

## 2017-04-02 DIAGNOSIS — L97524 Non-pressure chronic ulcer of other part of left foot with necrosis of bone: Secondary | ICD-10-CM | POA: Diagnosis not present

## 2017-04-02 DIAGNOSIS — L97219 Non-pressure chronic ulcer of right calf with unspecified severity: Secondary | ICD-10-CM | POA: Diagnosis not present

## 2017-04-02 DIAGNOSIS — E11621 Type 2 diabetes mellitus with foot ulcer: Secondary | ICD-10-CM | POA: Diagnosis not present

## 2017-04-03 ENCOUNTER — Ambulatory Visit: Payer: Medicare Other | Admitting: Family

## 2017-04-06 ENCOUNTER — Ambulatory Visit: Payer: Medicare Other | Admitting: Podiatry

## 2017-04-06 DIAGNOSIS — L97524 Non-pressure chronic ulcer of other part of left foot with necrosis of bone: Secondary | ICD-10-CM | POA: Diagnosis not present

## 2017-04-06 DIAGNOSIS — E1169 Type 2 diabetes mellitus with other specified complication: Secondary | ICD-10-CM | POA: Diagnosis not present

## 2017-04-06 DIAGNOSIS — M86172 Other acute osteomyelitis, left ankle and foot: Secondary | ICD-10-CM | POA: Diagnosis not present

## 2017-04-06 DIAGNOSIS — E11621 Type 2 diabetes mellitus with foot ulcer: Secondary | ICD-10-CM | POA: Diagnosis not present

## 2017-04-06 DIAGNOSIS — B964 Proteus (mirabilis) (morganii) as the cause of diseases classified elsewhere: Secondary | ICD-10-CM | POA: Diagnosis not present

## 2017-04-06 DIAGNOSIS — B952 Enterococcus as the cause of diseases classified elsewhere: Secondary | ICD-10-CM | POA: Diagnosis not present

## 2017-04-09 ENCOUNTER — Encounter (HOSPITAL_BASED_OUTPATIENT_CLINIC_OR_DEPARTMENT_OTHER): Payer: Medicare Other | Attending: Internal Medicine

## 2017-04-09 DIAGNOSIS — D693 Immune thrombocytopenic purpura: Secondary | ICD-10-CM | POA: Diagnosis not present

## 2017-04-09 DIAGNOSIS — M810 Age-related osteoporosis without current pathological fracture: Secondary | ICD-10-CM | POA: Diagnosis not present

## 2017-04-09 DIAGNOSIS — Z853 Personal history of malignant neoplasm of breast: Secondary | ICD-10-CM | POA: Insufficient documentation

## 2017-04-09 DIAGNOSIS — I10 Essential (primary) hypertension: Secondary | ICD-10-CM | POA: Insufficient documentation

## 2017-04-09 DIAGNOSIS — E11622 Type 2 diabetes mellitus with other skin ulcer: Secondary | ICD-10-CM | POA: Diagnosis not present

## 2017-04-09 DIAGNOSIS — M86172 Other acute osteomyelitis, left ankle and foot: Secondary | ICD-10-CM | POA: Insufficient documentation

## 2017-04-09 DIAGNOSIS — I87332 Chronic venous hypertension (idiopathic) with ulcer and inflammation of left lower extremity: Secondary | ICD-10-CM | POA: Diagnosis not present

## 2017-04-09 DIAGNOSIS — Z8673 Personal history of transient ischemic attack (TIA), and cerebral infarction without residual deficits: Secondary | ICD-10-CM | POA: Insufficient documentation

## 2017-04-09 DIAGNOSIS — L97526 Non-pressure chronic ulcer of other part of left foot with bone involvement without evidence of necrosis: Secondary | ICD-10-CM | POA: Diagnosis not present

## 2017-04-09 DIAGNOSIS — L97212 Non-pressure chronic ulcer of right calf with fat layer exposed: Secondary | ICD-10-CM | POA: Diagnosis not present

## 2017-04-09 DIAGNOSIS — L97524 Non-pressure chronic ulcer of other part of left foot with necrosis of bone: Secondary | ICD-10-CM | POA: Insufficient documentation

## 2017-04-09 DIAGNOSIS — I890 Lymphedema, not elsewhere classified: Secondary | ICD-10-CM | POA: Insufficient documentation

## 2017-04-09 DIAGNOSIS — J439 Emphysema, unspecified: Secondary | ICD-10-CM | POA: Diagnosis not present

## 2017-04-09 DIAGNOSIS — E1169 Type 2 diabetes mellitus with other specified complication: Secondary | ICD-10-CM | POA: Diagnosis not present

## 2017-04-09 DIAGNOSIS — L97222 Non-pressure chronic ulcer of left calf with fat layer exposed: Secondary | ICD-10-CM | POA: Diagnosis not present

## 2017-04-09 DIAGNOSIS — E11621 Type 2 diabetes mellitus with foot ulcer: Secondary | ICD-10-CM | POA: Insufficient documentation

## 2017-04-09 DIAGNOSIS — L928 Other granulomatous disorders of the skin and subcutaneous tissue: Secondary | ICD-10-CM | POA: Diagnosis not present

## 2017-04-10 ENCOUNTER — Ambulatory Visit (INDEPENDENT_AMBULATORY_CARE_PROVIDER_SITE_OTHER): Payer: Medicare Other | Admitting: Internal Medicine

## 2017-04-10 ENCOUNTER — Encounter: Payer: Self-pay | Admitting: Internal Medicine

## 2017-04-10 DIAGNOSIS — G629 Polyneuropathy, unspecified: Secondary | ICD-10-CM | POA: Diagnosis present

## 2017-04-10 DIAGNOSIS — I872 Venous insufficiency (chronic) (peripheral): Secondary | ICD-10-CM | POA: Diagnosis not present

## 2017-04-10 DIAGNOSIS — E0821 Diabetes mellitus due to underlying condition with diabetic nephropathy: Secondary | ICD-10-CM | POA: Diagnosis not present

## 2017-04-10 DIAGNOSIS — M86672 Other chronic osteomyelitis, left ankle and foot: Secondary | ICD-10-CM | POA: Insufficient documentation

## 2017-04-10 DIAGNOSIS — E119 Type 2 diabetes mellitus without complications: Secondary | ICD-10-CM | POA: Insufficient documentation

## 2017-04-10 NOTE — Progress Notes (Signed)
[facility] for Infectious Disease  Reason for Consult: Left foot osteomyelitis Referring Physician: Dr. Dossie [name]  Assessment: She has polymicrobial left fifth metatarsal osteomyelitis.  This is probably a mixed aerobic and anaerobic infection.  I discussed various management options with her including conversion to IV antibiotic therapy and possible surgery.  She says that she feels overwhelmed with her current situation.  She lives here in [place] with her daughter and son-in-law.  She does not feel like they could manage outpatient intravenous antibiotics.  She would like to talk to her daughter about this before making any final decision.  She has been seeing Dr. Gardiner [name] but recently canceled a follow-up appointment with him because she had other doctor appointments.  I encouraged her to reschedule with him as soon as possible.  She will continue her current antibiotic therapy for now and follow-up with me next week.  Plan: 1. Continue amoxicillin clavulanate and doxycycline for now 2. Schedule follow-up with Dr. Prudence [name] 3. Follow-up here on 04/19/2017  Patient Active Problem List   Diagnosis Date Noted  . Chronic osteomyelitis of left foot ([place]) 04/10/2017    Priority: High  . Diabetes mellitus ([place]) 04/10/2017  . Venous stasis dermatitis of both lower extremities 04/10/2017  . Neuropathy 04/10/2017  . Breast cancer of upper-inner quadrant of right female breast ([place]) 10/05/2014  . Occult blood positive stool 03/24/2014  . History of ITP 04/14/2011    Patient's Medications  New Prescriptions   No medications on file  Previous Medications   AMOXICILLIN-CLAVULANATE (AUGMENTIN) 875-125 MG TABLET    TAKE 1 TABLET BY MOUTH TWICE A DAY X14 DAYS   ASPIRIN 81 MG TABLET    Take 81 mg by mouth daily.   CALCIUM CARBONATE (TUMS - DOSED IN MG ELEMENTAL CALCIUM) 500 MG CHEWABLE TABLET    Chew 1 tablet by mouth as needed for  indigestion or heartburn.   CHOLECALCIFEROL (VITAMIN D3) 2000 UNITS TABS    Take 2,000 Units by mouth daily.   DOXYCYCLINE (MONODOX) 100 MG CAPSULE    TAKE 1 CAPSULE BY MOUTH TWICE A DAY FOR 7 DAYS   FEXOFENADINE (ALLEGRA) 180 MG TABLET    Take 180 mg by mouth daily as needed for allergies.    METFORMIN (GLUCOPHAGE) 500 MG TABLET    Take 500 mg by mouth 2 (two) times daily with a meal.   OMEGA 3 1200 MG CAPS    Take 1,200 mg by mouth daily.    PANTOPRAZOLE (PROTONIX) 40 MG TABLET    Take 40 mg by mouth daily.    VALSARTAN (DIOVAN) 320 MG TABLET    TAKE 1/2 TO 1 TABLET EVERY DAY  Modified Medications   No medications on file  Discontinued Medications   No medications on file    HPI: Chelsea [name] is a 84 y.o. female with diabetes, peripheral neuropathy and venous stasis.  She recently developed cellulitis of her left leg in January and was treated with empiric cephalexin and doxycycline.  She developed some venous stasis ulcers on her left lower leg and was referred to the wound center.  The leg ulcers improved but she developed an open wound under an area where she had had a callus over the left fifth metatarsal head.  She had exposed bone and foul-smelling drainage.  A culture of drainage was obtained on 03/08/2017.  Gram stain showed gram-positive cocci in pairs, gram-negative rods  and gram-positive rods.  Cultures grew antibiotic susceptible Proteus and enterococcus.  Showed marrow edema and cortical destruction of the left fifth metatarsal head compatible with osteomyelitis.  For the past several weeks she has been on amoxicillin clavulanate.  Doxycycline was added recently.  She is not sure if there has been any improvement.  Review of Systems: Review of Systems  Constitutional: Negative for chills, diaphoresis and fever.  Gastrointestinal: Negative for abdominal pain, diarrhea, nausea and vomiting.  Musculoskeletal: Positive for joint pain.  Neurological: Positive for sensory change.       Past Medical History:  Diagnosis Date  . Anemia    takes Ferrous Sulfate daily  . Arthritis   . Basal cell carcinoma of breast 1998  . Bruises easily   . Dizziness   . DM (diabetes mellitus) ([place])    takes Metformin daily  . Eczema   . Emphysema lung ([place])   . GERD (gastroesophageal reflux disease)    takes Protonix daily  . Glaucoma    Bil  . History of hiatal hernia   . HTN (hypertension)    takes Diovan daily  . Hyperlipidemia   . ITP (idiopathic thrombocytopenic purpura) 2/09  . Joint pain   . Joint swelling   . Nocturia   . Osteoporosis   . Peripheral edema   . Stroke ([place]) 02/03/09   mid brain, diplopia    Social History   Tobacco Use  . Smoking status: Never Smoker  . Smokeless tobacco: Never Used  Substance Use Topics  . Alcohol use: No    Alcohol/week: 0.0 oz  . Drug use: No    Family History  Problem Relation Age of Onset  . Breast cancer Mother 69  . Colon cancer Father   . Diabetes Father        questionable  . Multiple sclerosis Daughter   . Heart disease Maternal Grandfather    Allergies  Allergen Reactions  . Travatan Z [Travoprost (Bak Free)] Other (See Comments)    dizziness    OBJECTIVE: Vitals:   04/10/17 1339  BP: (!) 173/97  Pulse: (!) 111  Weight: 193 lb (87.5 kg)  Height: 5\' 2"  (1.575 m)   Body mass index is 35.3 kg/m.   Physical Exam  Constitutional: She is oriented to person, place, and time.  She is very pleasant and in no distress.  Musculoskeletal:  She has nonpitting edema of her left lower leg with splotchy erythema and dry flaky skin.  There are no open ulcers on her lower leg.  She does have a dime sized ulcer lateral to her left fifth metatarsal head.  There is exposed bone and malodorous drainage.  Neurological: She is alert and oriented to person, place, and time.  Psychiatric: Mood and affect normal.    Microbiology: Left foot wound 03/08/2017 Gram stain showed gram-negative rods,  gram-positive cocci in pairs and gram-positive rods Culture grew an antibiotic susceptible Proteus and enterococcus  Left foot MRI with and without contrast 03/19/2017  IMPRESSION: 1. Soft tissue ulcer overlying the fifth metatarsal head. Cortical destruction of the lateral margin of the fifth metatarsal head with severe underlying marrow edema most concerning for osteomyelitis. Marrow edema and enhancement at the base of the fifth proximal phalanx without definite cortical destruction which may reflect reactive marrow changes secondary to adjacent inflammation versus osteomyelitis. 2. Cellulitis along the dorsal lateral left foot.    By: Kathreen Devoid   On: 03/19/2017 15:56  Michel Bickers, MD [facility] for  Infectious Disease [facility] [phone] pager   [phone] cell 04/10/2017, 1:46 PM

## 2017-04-13 DIAGNOSIS — L97524 Non-pressure chronic ulcer of other part of left foot with necrosis of bone: Secondary | ICD-10-CM | POA: Diagnosis not present

## 2017-04-13 DIAGNOSIS — B964 Proteus (mirabilis) (morganii) as the cause of diseases classified elsewhere: Secondary | ICD-10-CM | POA: Diagnosis not present

## 2017-04-13 DIAGNOSIS — E11621 Type 2 diabetes mellitus with foot ulcer: Secondary | ICD-10-CM | POA: Diagnosis not present

## 2017-04-13 DIAGNOSIS — E1169 Type 2 diabetes mellitus with other specified complication: Secondary | ICD-10-CM | POA: Diagnosis not present

## 2017-04-13 DIAGNOSIS — M86172 Other acute osteomyelitis, left ankle and foot: Secondary | ICD-10-CM | POA: Diagnosis not present

## 2017-04-13 DIAGNOSIS — B952 Enterococcus as the cause of diseases classified elsewhere: Secondary | ICD-10-CM | POA: Diagnosis not present

## 2017-04-16 DIAGNOSIS — E1169 Type 2 diabetes mellitus with other specified complication: Secondary | ICD-10-CM | POA: Diagnosis not present

## 2017-04-16 DIAGNOSIS — L97222 Non-pressure chronic ulcer of left calf with fat layer exposed: Secondary | ICD-10-CM | POA: Diagnosis not present

## 2017-04-16 DIAGNOSIS — I890 Lymphedema, not elsewhere classified: Secondary | ICD-10-CM | POA: Diagnosis not present

## 2017-04-16 DIAGNOSIS — L928 Other granulomatous disorders of the skin and subcutaneous tissue: Secondary | ICD-10-CM | POA: Diagnosis not present

## 2017-04-16 DIAGNOSIS — I87332 Chronic venous hypertension (idiopathic) with ulcer and inflammation of left lower extremity: Secondary | ICD-10-CM | POA: Diagnosis not present

## 2017-04-16 DIAGNOSIS — S81802A Unspecified open wound, left lower leg, initial encounter: Secondary | ICD-10-CM | POA: Diagnosis not present

## 2017-04-16 DIAGNOSIS — L97524 Non-pressure chronic ulcer of other part of left foot with necrosis of bone: Secondary | ICD-10-CM | POA: Diagnosis not present

## 2017-04-16 DIAGNOSIS — E11621 Type 2 diabetes mellitus with foot ulcer: Secondary | ICD-10-CM | POA: Diagnosis not present

## 2017-04-16 DIAGNOSIS — L97526 Non-pressure chronic ulcer of other part of left foot with bone involvement without evidence of necrosis: Secondary | ICD-10-CM | POA: Diagnosis not present

## 2017-04-16 DIAGNOSIS — M8668 Other chronic osteomyelitis, other site: Secondary | ICD-10-CM | POA: Diagnosis not present

## 2017-04-17 ENCOUNTER — Encounter: Payer: Self-pay | Admitting: Podiatry

## 2017-04-17 ENCOUNTER — Ambulatory Visit (INDEPENDENT_AMBULATORY_CARE_PROVIDER_SITE_OTHER): Payer: Medicare Other

## 2017-04-17 ENCOUNTER — Ambulatory Visit (INDEPENDENT_AMBULATORY_CARE_PROVIDER_SITE_OTHER): Payer: Medicare Other | Admitting: Podiatry

## 2017-04-17 VITALS — BP 165/93 | HR 85 | Temp 96.2°F | Resp 18

## 2017-04-17 DIAGNOSIS — M869 Osteomyelitis, unspecified: Secondary | ICD-10-CM

## 2017-04-17 DIAGNOSIS — L97529 Non-pressure chronic ulcer of other part of left foot with unspecified severity: Secondary | ICD-10-CM

## 2017-04-19 ENCOUNTER — Encounter: Payer: Self-pay | Admitting: Internal Medicine

## 2017-04-19 ENCOUNTER — Ambulatory Visit (INDEPENDENT_AMBULATORY_CARE_PROVIDER_SITE_OTHER): Payer: Medicare Other | Admitting: Internal Medicine

## 2017-04-19 DIAGNOSIS — M86672 Other chronic osteomyelitis, left ankle and foot: Secondary | ICD-10-CM

## 2017-04-19 LAB — CBC WITH DIFFERENTIAL/PLATELET
BASOS PCT: 1.1 %
Basophils Absolute: 50 cells/uL (ref 0–200)
EOS ABS: 81 {cells}/uL (ref 15–500)
Eosinophils Relative: 1.8 %
HEMATOCRIT: 39.5 % (ref 35.0–45.0)
HEMOGLOBIN: 13.3 g/dL (ref 11.7–15.5)
LYMPHS ABS: 1224 {cells}/uL (ref 850–3900)
MCH: 28.1 pg (ref 27.0–33.0)
MCHC: 33.7 g/dL (ref 32.0–36.0)
MCV: 83.3 fL (ref 80.0–100.0)
MONOS PCT: 9.4 %
MPV: 9.6 fL (ref 7.5–12.5)
NEUTROS ABS: 2723 {cells}/uL (ref 1500–7800)
Neutrophils Relative %: 60.5 %
Platelets: 148 10*3/uL (ref 140–400)
RBC: 4.74 10*6/uL (ref 3.80–5.10)
RDW: 13.2 % (ref 11.0–15.0)
Total Lymphocyte: 27.2 %
WBC mixed population: 423 cells/uL (ref 200–950)
WBC: 4.5 10*3/uL (ref 3.8–10.8)

## 2017-04-19 LAB — COMPREHENSIVE METABOLIC PANEL
AG RATIO: 1.2 (calc) (ref 1.0–2.5)
ALT: 14 U/L (ref 6–29)
AST: 21 U/L (ref 10–35)
Albumin: 3.5 g/dL — ABNORMAL LOW (ref 3.6–5.1)
Alkaline phosphatase (APISO): 71 U/L (ref 33–130)
BILIRUBIN TOTAL: 0.5 mg/dL (ref 0.2–1.2)
BUN: 11 mg/dL (ref 7–25)
CALCIUM: 9.4 mg/dL (ref 8.6–10.4)
CO2: 28 mmol/L (ref 20–32)
Chloride: 105 mmol/L (ref 98–110)
Creat: 0.72 mg/dL (ref 0.60–0.88)
Globulin: 3 g/dL (calc) (ref 1.9–3.7)
Glucose, Bld: 96 mg/dL (ref 65–99)
Potassium: 4 mmol/L (ref 3.5–5.3)
SODIUM: 140 mmol/L (ref 135–146)
TOTAL PROTEIN: 6.5 g/dL (ref 6.1–8.1)

## 2017-04-19 LAB — C-REACTIVE PROTEIN: CRP: 3.4 mg/L (ref <8.0)

## 2017-04-19 LAB — SEDIMENTATION RATE: Sed Rate: 9 mm/h (ref 0–30)

## 2017-04-19 LAB — HEMOGLOBIN A1C
Hgb A1c MFr Bld: 6.8 % of total Hgb — ABNORMAL HIGH (ref <5.7)
Mean Plasma Glucose: 148 (calc)
eAG (mmol/L): 8.2 (calc)

## 2017-04-19 NOTE — Assessment & Plan Note (Signed)
She has chronic osteomyelitis of the left fifth metatarsal head.  Repeat x-ray obtained 2 days ago shows complete lysis of the metatarsal head and lucency of the mid shaft of the fifth metatarsal.  I have reviewed the x-ray images with her.  She is now in full agreement to undergo surgery.  I would recommend hospitalization followed by physical therapy and placement in short-term inpatient rehab if possible.  I will have her continue oral amoxicillin clavulanate and doxycycline up until the time of surgery.  I recommend submitting bone for routine Gram stain, aerobic and anaerobic cultures to help guide postoperative antibiotic therapy.

## 2017-04-19 NOTE — Progress Notes (Signed)
Thanks. We will get her back in to be seen.

## 2017-04-19 NOTE — Progress Notes (Signed)
[facility] for Infectious Disease  Patient Active Problem List   Diagnosis Date Noted  . Chronic osteomyelitis of left foot ([place]) 04/10/2017    Priority: High  . Diabetes mellitus ([place]) 04/10/2017  . Venous stasis dermatitis of both lower extremities 04/10/2017  . Neuropathy 04/10/2017  . Breast cancer of upper-inner quadrant of right female breast ([place]) 10/05/2014  . Occult blood positive stool 03/24/2014  . History of ITP 04/14/2011    Patient's Medications  New Prescriptions   No medications on file  Previous Medications   AMOXICILLIN-CLAVULANATE (AUGMENTIN) 875-125 MG TABLET    TAKE 1 TABLET BY MOUTH TWICE A DAY X14 DAYS   ASPIRIN 81 MG TABLET    Take 81 mg by mouth daily.   CALCIUM CARBONATE (TUMS - DOSED IN MG ELEMENTAL CALCIUM) 500 MG CHEWABLE TABLET    Chew 1 tablet by mouth as needed for indigestion or heartburn.   CHOLECALCIFEROL (VITAMIN D3) 2000 UNITS TABS    Take 2,000 Units by mouth daily.   DOXYCYCLINE (MONODOX) 100 MG CAPSULE    TAKE 1 CAPSULE BY MOUTH TWICE A DAY FOR 7 DAYS   FEXOFENADINE (ALLEGRA) 180 MG TABLET    Take 180 mg by mouth daily as needed for allergies.    METFORMIN (GLUCOPHAGE) 500 MG TABLET    Take 500 mg by mouth 2 (two) times daily with a meal.   OMEGA 3 1200 MG CAPS    Take 1,200 mg by mouth daily.    PANTOPRAZOLE (PROTONIX) 40 MG TABLET    Take 40 mg by mouth daily.    VALSARTAN (DIOVAN) 320 MG TABLET    TAKE 1/2 TO 1 TABLET EVERY DAY  Modified Medications   No medications on file  Discontinued Medications   No medications on file    Subjective: Chelsea [name] is in for her routine follow-up appointment.  She is a 84 y.o. female with diabetes, peripheral neuropathy and venous stasis.  She recently developed cellulitis of her left leg in January and was treated with empiric cephalexin and doxycycline.  She developed some venous stasis ulcers on her left lower leg and was referred to the wound center.  The leg ulcers improved but she  developed an open wound under an area where she had had a callus over the left fifth metatarsal head.  She had exposed bone and foul-smelling drainage.  A culture of drainage was obtained on 03/08/2017.  Gram stain showed gram-positive cocci in pairs, gram-negative rods and gram-positive rods.  Cultures grew antibiotic susceptible Proteus and enterococcus.  MRI showed marrow edema and cortical destruction of the left fifth metatarsal head compatible with osteomyelitis.    She continues to take amoxicillin clavulanate and doxycycline.  She has had no problems tolerating her antibiotics but does not feel like her foot is getting better.  When I saw her last week she was not certain if she wanted to go on intravenous antibiotics or whether she would agree to have surgery.  She saw Dr. Jacqualyn Posey, her podiatrist, 2 days ago when discussed the possibility of surgery again.  She now tells me that she believes surgery is her best option.  She is worried that she will not be able to go home a right after surgery.  She says she has a room in her daughter's home but has to take care of herself.  She feels like she would do better if she was hospitalized and then sent to inpatient rehab for  a period of time until she can safely care for herself at home.  She does not feel like she could manage IV antibiotics as an outpatient.    Review of Systems: Review of Systems  Constitutional: Negative for chills, diaphoresis and fever.  Gastrointestinal: Negative for abdominal pain, diarrhea, nausea and vomiting.  Musculoskeletal: Positive for joint pain.    Past Medical History:  Diagnosis Date  . Anemia    takes Ferrous Sulfate daily  . Arthritis   . Basal cell carcinoma of breast 1998  . Bruises easily   . Dizziness   . DM (diabetes mellitus) ([place])    takes Metformin daily  . Eczema   . Emphysema lung ([place])   . GERD (gastroesophageal reflux disease)    takes Protonix daily  . Glaucoma    Bil  . History of hiatal  hernia   . HTN (hypertension)    takes Diovan daily  . Hyperlipidemia   . ITP (idiopathic thrombocytopenic purpura) 2/09  . Joint pain   . Joint swelling   . Nocturia   . Osteoporosis   . Peripheral edema   . Stroke ([place]) 02/03/09   mid brain, diplopia    Social History   Tobacco Use  . Smoking status: Never Smoker  . Smokeless tobacco: Never Used  Substance Use Topics  . Alcohol use: No    Alcohol/week: 0.0 oz  . Drug use: No    Family History  Problem Relation Age of Onset  . Breast cancer Mother 29  . Colon cancer Father   . Diabetes Father        questionable  . Multiple sclerosis Daughter   . Heart disease Maternal Grandfather     Allergies  Allergen Reactions  . Travatan Z [Travoprost (Bak Free)] Other (See Comments)    dizziness    Objective: Vitals:   04/19/17 1010  BP: (!) 163/95  Pulse: 97  Temp: (!) 97.3 F (36.3 C)  TempSrc: Oral  Weight: 191 lb (86.6 kg)  Height: 5\' 2"  (1.575 m)   Body mass index is 34.93 kg/m.  Physical Exam  Constitutional: She is oriented to person, place, and time.  She is much calmer than she was last week.  She seems very certain about what she needs to do now.  Musculoskeletal:  She has diffuse edema of her left leg.  She has erythema compatible with venous stasis changes of her left lower leg.  She also has erythema over her distal left foot.  The ulcer on the lateral side of her left foot over the left fifth metatarsal head is unchanged.  Neurological: She is alert and oriented to person, place, and time.  Skin: No rash noted.  Psychiatric: Mood and affect normal.    Lab Results Sed Rate  Date Value  04/17/2017 9 mm/h  02/04/2009 11 mm/hr   CRP  Date Value  04/17/2017 3.4 mg/L  02/04/2009 0.8 mg/dL (H)     Problem List Items Addressed This Visit      High   Chronic osteomyelitis of left foot ([place])    She has chronic osteomyelitis of the left fifth metatarsal head.  Repeat x-ray obtained 2 days ago  shows complete lysis of the metatarsal head and lucency of the mid shaft of the fifth metatarsal.  I have reviewed the x-ray images with her.  She is now in full agreement to undergo surgery.  I would recommend hospitalization followed by physical therapy and placement in short-term inpatient rehab  if possible.  I will have her continue oral amoxicillin clavulanate and doxycycline up until the time of surgery.  I recommend submitting bone for routine Gram stain, aerobic and anaerobic cultures to help guide postoperative antibiotic therapy.          Michel Bickers, MD [facility] [phone] pager   [phone] cell 04/19/2017, 10:50 AM

## 2017-04-22 NOTE — Progress Notes (Signed)
Subjective: Chelsea [name] presents the office today for concerns of osteomyelitis and infection, wound to her left foot.  This is been ongoing for quite some time and is on the lateral fifth metatarsal head.  She is been under the care of the wound care center as well as infectious disease for this.  She is currently taking Augmentin and doxycycline.  She has a follow-up appointment with infectious disease on Thursday and she states that they are considering IV antibiotics.  She had an MRI performed last month.  She has not had any recent blood work.  She appears she did have arterial studies which did reveal abnormal ABI and TBI. Denies any systemic complaints such as fevers, chills, nausea, vomiting. No acute changes since last appointment, and no other complaints at this time.   Objective: AAO x3, NAD DP/PT pulses palpable bilaterally, CRT less than 3 seconds On the left fifth metatarsal head is a granular wound with some necrotic tissue overlying the area.  There is also surrounding erythema extending to the dorsal MPJs but there is no ascending cellulitis.  Subjectively she states that the redness is much improved.  There is no drainage or pus coming from the wound today there is no fluctuation or crepitation or malodor.  There is no probing to bone, undermining or tunneling. No open lesions or pre-ulcerative lesions.  No pain with calf compression, swelling, warmth, erythema          Assessment: Cellulitis, osteomyelitis left foot  Plan: -All treatment options discussed with the patient including all alternatives, risks, complications.  -X-rays were obtained and reviewed.  There is chronic osteomyelitis of the fifth metatarsal head.  No soft tissue emphysema is present. -I did review chart with her.  We had a long discussion regards to both surgery as well as conservative treatment.  At this point I recommended a surgery.  She does not will have amputation of toe.  It appears clinically that  the infection of the fifth metatarsal weight discussed the fifth metatarsal head resection.  We discussed the surgery and the postoperative course.  She states this is a lot for her to take today and she was to think about it and follow-up with Dr. Megan Salon on Thursday.  I will schedule her for next week for follow-up and further discuss should she want to have surgery.  I also ordered blood work including CBC, ESR, CRP, CMP, A1c. -Monitor for any clinical signs or symptoms of infection and directed to call the office immediately should any occur or go to the ER.  Trula Slade DPM   -Patient encouraged to call the office with any questions, concerns, change in symptoms.

## 2017-04-23 DIAGNOSIS — E11621 Type 2 diabetes mellitus with foot ulcer: Secondary | ICD-10-CM | POA: Diagnosis not present

## 2017-04-23 DIAGNOSIS — E1169 Type 2 diabetes mellitus with other specified complication: Secondary | ICD-10-CM | POA: Diagnosis not present

## 2017-04-23 DIAGNOSIS — I87332 Chronic venous hypertension (idiopathic) with ulcer and inflammation of left lower extremity: Secondary | ICD-10-CM | POA: Diagnosis not present

## 2017-04-23 DIAGNOSIS — M8618 Other acute osteomyelitis, other site: Secondary | ICD-10-CM | POA: Diagnosis not present

## 2017-04-23 DIAGNOSIS — L97222 Non-pressure chronic ulcer of left calf with fat layer exposed: Secondary | ICD-10-CM | POA: Diagnosis not present

## 2017-04-23 DIAGNOSIS — I890 Lymphedema, not elsewhere classified: Secondary | ICD-10-CM | POA: Diagnosis not present

## 2017-04-23 DIAGNOSIS — L97524 Non-pressure chronic ulcer of other part of left foot with necrosis of bone: Secondary | ICD-10-CM | POA: Diagnosis not present

## 2017-04-23 DIAGNOSIS — L97526 Non-pressure chronic ulcer of other part of left foot with bone involvement without evidence of necrosis: Secondary | ICD-10-CM | POA: Diagnosis not present

## 2017-04-25 DIAGNOSIS — E11621 Type 2 diabetes mellitus with foot ulcer: Secondary | ICD-10-CM | POA: Diagnosis not present

## 2017-04-25 DIAGNOSIS — L97524 Non-pressure chronic ulcer of other part of left foot with necrosis of bone: Secondary | ICD-10-CM | POA: Diagnosis not present

## 2017-04-25 DIAGNOSIS — E1169 Type 2 diabetes mellitus with other specified complication: Secondary | ICD-10-CM | POA: Diagnosis not present

## 2017-04-25 DIAGNOSIS — M86172 Other acute osteomyelitis, left ankle and foot: Secondary | ICD-10-CM | POA: Diagnosis not present

## 2017-04-25 DIAGNOSIS — B964 Proteus (mirabilis) (morganii) as the cause of diseases classified elsewhere: Secondary | ICD-10-CM | POA: Diagnosis not present

## 2017-04-25 DIAGNOSIS — B952 Enterococcus as the cause of diseases classified elsewhere: Secondary | ICD-10-CM | POA: Diagnosis not present

## 2017-04-27 DIAGNOSIS — E1169 Type 2 diabetes mellitus with other specified complication: Secondary | ICD-10-CM | POA: Diagnosis not present

## 2017-04-27 DIAGNOSIS — B952 Enterococcus as the cause of diseases classified elsewhere: Secondary | ICD-10-CM | POA: Diagnosis not present

## 2017-04-27 DIAGNOSIS — M86172 Other acute osteomyelitis, left ankle and foot: Secondary | ICD-10-CM | POA: Diagnosis not present

## 2017-04-27 DIAGNOSIS — E11621 Type 2 diabetes mellitus with foot ulcer: Secondary | ICD-10-CM | POA: Diagnosis not present

## 2017-04-27 DIAGNOSIS — L97524 Non-pressure chronic ulcer of other part of left foot with necrosis of bone: Secondary | ICD-10-CM | POA: Diagnosis not present

## 2017-04-27 DIAGNOSIS — B964 Proteus (mirabilis) (morganii) as the cause of diseases classified elsewhere: Secondary | ICD-10-CM | POA: Diagnosis not present

## 2017-05-01 ENCOUNTER — Ambulatory Visit (INDEPENDENT_AMBULATORY_CARE_PROVIDER_SITE_OTHER): Payer: Medicare Other | Admitting: Podiatry

## 2017-05-01 DIAGNOSIS — L97529 Non-pressure chronic ulcer of other part of left foot with unspecified severity: Secondary | ICD-10-CM

## 2017-05-01 DIAGNOSIS — M869 Osteomyelitis, unspecified: Secondary | ICD-10-CM | POA: Diagnosis not present

## 2017-05-01 NOTE — Patient Instructions (Signed)

## 2017-05-02 DIAGNOSIS — E1169 Type 2 diabetes mellitus with other specified complication: Secondary | ICD-10-CM | POA: Diagnosis not present

## 2017-05-02 DIAGNOSIS — B964 Proteus (mirabilis) (morganii) as the cause of diseases classified elsewhere: Secondary | ICD-10-CM | POA: Diagnosis not present

## 2017-05-02 DIAGNOSIS — E11621 Type 2 diabetes mellitus with foot ulcer: Secondary | ICD-10-CM | POA: Diagnosis not present

## 2017-05-02 DIAGNOSIS — B952 Enterococcus as the cause of diseases classified elsewhere: Secondary | ICD-10-CM | POA: Diagnosis not present

## 2017-05-02 DIAGNOSIS — M86172 Other acute osteomyelitis, left ankle and foot: Secondary | ICD-10-CM | POA: Diagnosis not present

## 2017-05-02 DIAGNOSIS — L97524 Non-pressure chronic ulcer of other part of left foot with necrosis of bone: Secondary | ICD-10-CM | POA: Diagnosis not present

## 2017-05-03 NOTE — Progress Notes (Signed)
Subjective: Ms. Sandora presents the office today for surgical consultation due to osteomyelitis of the left fifth metatarsal.  She has been under the active care of Dr. Izola Price as well as Dr. Megan Salon.  At this point she wishes to proceed with fifth metatarsal head resection in order to remove infected bone.  She still states that she is concerned about the infection and she thinks would be best to go ahead and proceed with surgery at this point after she discussed this with Dr. Megan Salon.  This is been a long-standing wound without any signs of healing.  Denies any systemic complaints such as fevers, chills, nausea, vomiting. No acute changes since last appointment, and no other complaints at this time.   Objective: AAO x3, NAD DP/PT pulses palpable bilaterally, CRT less than 3 seconds Ulceration continues to lateral aspect fifth metatarsal head with a granular wound base.  There is no probing to bone but it does remain.  There is mild erythema to the dorsal forefoot and subjectively she states this is improved and actually appears to be mildly improved compared to last appointment.  There is no fluctuation or crepitation.  There is no malodor. Swelling, chronic, bilateral lower extremities without any drainage to the legs or any open sores. No other open lesions or pre-ulcerative lesions.  No pain with calf compression, swelling, warmth, erythema  Assessment: Osteomyelitis left fifth metatarsal  Plan: -All treatment options discussed with the patient including all alternatives, risks, complications.  -Can I reviewed the x-rays with her as well as her lab work.  At this time we discussed both conservative as well as surgical options and I recommended fifth metatarsal head resection and to remove a portion of the fifth metatarsal head to help remove the infected bone.  She does not want the toe amputated and on x-ray does not appear that there is infection of the toe.  Discussed with her we will  proceed with a partial fifth metatarsal resection and wound debridement next week.  We discussed the surgery as well as the postoperative course.  Discussed this is not a guarantee of resolution of infection and still spread but hopefully we can get it controlled and the wound to heal. -The incision placement as well as the postoperative course was discussed with the patient. I discussed risks of the surgery which include, but not limited to, infection, bleeding, pain, swelling, need for further surgery, delayed or nonhealing, painful or ugly scar, numbness or sensation changes, over/under correction, recurrence, transfer lesions, further deformity, hardware failure, DVT/PE, loss of toe/foot. Patient understands these risks and wishes to proceed with surgery. The surgical consent was reviewed with the patient all 3 pages were signed. No promises or guarantees were given to the outcome of the procedure. All questions were answered to the best of my ability. Before the surgery the patient was encouraged to call the office if there is any further questions. The surgery will be performed at [facility].   -We discussed rehab after surgery.  We will likely admit her to the hospital postoperatively.  Trula Slade DPM   Cc: Dr. Jani Gravel, MD Cc: Dr. Michel Bickers, MD

## 2017-05-04 DIAGNOSIS — L97222 Non-pressure chronic ulcer of left calf with fat layer exposed: Secondary | ICD-10-CM | POA: Diagnosis not present

## 2017-05-04 DIAGNOSIS — E1169 Type 2 diabetes mellitus with other specified complication: Secondary | ICD-10-CM | POA: Diagnosis not present

## 2017-05-04 DIAGNOSIS — E11621 Type 2 diabetes mellitus with foot ulcer: Secondary | ICD-10-CM | POA: Diagnosis not present

## 2017-05-04 DIAGNOSIS — I890 Lymphedema, not elsewhere classified: Secondary | ICD-10-CM | POA: Diagnosis not present

## 2017-05-04 DIAGNOSIS — I87332 Chronic venous hypertension (idiopathic) with ulcer and inflammation of left lower extremity: Secondary | ICD-10-CM | POA: Diagnosis not present

## 2017-05-04 DIAGNOSIS — L97522 Non-pressure chronic ulcer of other part of left foot with fat layer exposed: Secondary | ICD-10-CM | POA: Diagnosis not present

## 2017-05-04 DIAGNOSIS — L97524 Non-pressure chronic ulcer of other part of left foot with necrosis of bone: Secondary | ICD-10-CM | POA: Diagnosis not present

## 2017-05-05 DIAGNOSIS — M86172 Other acute osteomyelitis, left ankle and foot: Secondary | ICD-10-CM | POA: Diagnosis not present

## 2017-05-05 DIAGNOSIS — B952 Enterococcus as the cause of diseases classified elsewhere: Secondary | ICD-10-CM | POA: Diagnosis not present

## 2017-05-05 DIAGNOSIS — E11621 Type 2 diabetes mellitus with foot ulcer: Secondary | ICD-10-CM | POA: Diagnosis not present

## 2017-05-05 DIAGNOSIS — B964 Proteus (mirabilis) (morganii) as the cause of diseases classified elsewhere: Secondary | ICD-10-CM | POA: Diagnosis not present

## 2017-05-05 DIAGNOSIS — L97524 Non-pressure chronic ulcer of other part of left foot with necrosis of bone: Secondary | ICD-10-CM | POA: Diagnosis not present

## 2017-05-05 DIAGNOSIS — E1169 Type 2 diabetes mellitus with other specified complication: Secondary | ICD-10-CM | POA: Diagnosis not present

## 2017-05-07 ENCOUNTER — Telehealth: Payer: Self-pay | Admitting: Podiatry

## 2017-05-07 ENCOUNTER — Telehealth: Payer: Self-pay | Admitting: *Deleted

## 2017-05-07 ENCOUNTER — Telehealth: Payer: Self-pay

## 2017-05-07 NOTE — Telephone Encounter (Signed)
Did she ever get scheduled for surgery??

## 2017-05-07 NOTE — Telephone Encounter (Signed)
Patient was recently at wound care and was told her wound seems to be healing well.  She was scheduled for surgery but now wonders is she should try IV antibiotic route.   Per Dr Megan Salon pateint was scheduled for OV to discuss options.  OV 05-10-16.   Laverle Patter, RN

## 2017-05-07 NOTE — Telephone Encounter (Signed)
Chelsea [name] - [facility] states Dr. Jacqualyn Posey had given her the name of a pt that was concern whether she would need to go to rehab or [facility] services would serve her needs, but she needs pt's DOB to look up on Epic.

## 2017-05-07 NOTE — Telephone Encounter (Signed)
I spoke with Delydia and she states pt is on the "will call" list for surgery. I called [facility] - [facility] an gave the DOB.

## 2017-05-07 NOTE — Telephone Encounter (Signed)
Pt is going back over to be seen by Infectious Disease. So decided to hold off, on being seen.

## 2017-05-08 ENCOUNTER — Telehealth: Payer: Self-pay | Admitting: *Deleted

## 2017-05-08 NOTE — Telephone Encounter (Signed)
I left the patient a message to give me a call back.  I can't make up my mind about what to do.  I'm going to see Dr. Megan Salon tomorrow.  He said my wound looks good.  I'm going to hold off until I see Dr. Megan Salon again.  I don't know what to do.  I think that since the outside is looking so good that if I do the IV antibiotics it will help with the inside.  I'll talk to Dr. Megan Salon about it tomorrow and I see Dr. Dellia Nims at the [facility] on Friday.  "Dr. Jacqualyn Posey is concerned because you do have that infection.  "I know, I'll let him know something soon."

## 2017-05-10 ENCOUNTER — Encounter: Payer: Self-pay | Admitting: Internal Medicine

## 2017-05-10 ENCOUNTER — Other Ambulatory Visit: Payer: Self-pay

## 2017-05-10 ENCOUNTER — Ambulatory Visit (INDEPENDENT_AMBULATORY_CARE_PROVIDER_SITE_OTHER): Payer: Medicare Other | Admitting: Internal Medicine

## 2017-05-10 DIAGNOSIS — M86672 Other chronic osteomyelitis, left ankle and foot: Secondary | ICD-10-CM | POA: Diagnosis present

## 2017-05-10 MED ORDER — DOXYCYCLINE MONOHYDRATE 100 MG PO CAPS: 100.0000 mg | ORAL_CAPSULE | Freq: Two times a day (BID) | ORAL | 0 refills | Status: DC

## 2017-05-10 MED ORDER — AMOXICILLIN-POT CLAVULANATE 875-125 MG PO TABS: 875.0000 mg | ORAL_TABLET | Freq: Two times a day (BID) | ORAL | 0 refills | Status: DC

## 2017-05-10 NOTE — Addendum Note (Signed)
Addended by: Michel Bickers on: 05/10/2017 02:32 PM   Modules accepted: Orders

## 2017-05-10 NOTE — Progress Notes (Signed)
[facility] for Infectious Disease  Patient Active Problem List   Diagnosis Date Noted  . Chronic osteomyelitis of left foot ([place]) 04/10/2017    Priority: High  . Diabetes mellitus ([place]) 04/10/2017  . Venous stasis dermatitis of both lower extremities 04/10/2017  . Neuropathy 04/10/2017  . Breast cancer of upper-inner quadrant of right female breast ([place]) 10/05/2014  . Occult blood positive stool 03/24/2014  . History of ITP 04/14/2011    Patient's Medications  New Prescriptions   No medications on file  Previous Medications   AMOXICILLIN-CLAVULANATE (AUGMENTIN) 875-125 MG TABLET    TAKE 1 TABLET BY MOUTH TWICE A DAY X14 DAYS   ASPIRIN 81 MG TABLET    Take 81 mg by mouth daily.   CHOLECALCIFEROL (VITAMIN D3) 2000 UNITS TABS    Take 2,000 Units by mouth daily.   DOXYCYCLINE (MONODOX) 100 MG CAPSULE    TAKE 1 CAPSULE BY MOUTH TWICE A DAY FOR 7 DAYS   FERROUS SULFATE 325 (65 FE) MG TABLET    Take 325 mg by mouth daily.   FEXOFENADINE (ALLEGRA) 180 MG TABLET    Take 180 mg by mouth daily as needed for allergies.    FUROSEMIDE (LASIX) 20 MG TABLET       LOSARTAN (COZAAR) 100 MG TABLET    TAKE 1 TABLET EVERY DAY FOR BLOOD PRESSURE   METFORMIN (GLUCOPHAGE) 500 MG TABLET    Take 500 mg by mouth 2 (two) times daily with a meal.   OMEGA 3 1200 MG CAPS    Take 1,200 mg by mouth daily.    PANTOPRAZOLE (PROTONIX) 40 MG TABLET    Take 40 mg by mouth daily.    VALSARTAN (DIOVAN) 320 MG TABLET    TAKE 1/2 TO 1 TABLET EVERY DAY  Modified Medications   No medications on file  Discontinued Medications   CALCIUM CARBONATE (TUMS - DOSED IN MG ELEMENTAL CALCIUM) 500 MG CHEWABLE TABLET    Chew 1 tablet by mouth as needed for indigestion or heartburn.    Subjective: Chelsea [name] is in for her routine follow-up appointment.  She is a 84 y.o. female with diabetes, peripheral neuropathy and venous stasis.  She recently developed cellulitis of her left leg in January and was treated with empiric  cephalexin and doxycycline.  She developed some venous stasis ulcers on her left lower leg and was referred to the wound center.  The leg ulcers improved but she developed an open wound under an area where she had had a callus over the left fifth metatarsal head.  She had exposed bone and foul-smelling drainage.  A culture of drainage was obtained on 03/08/2017.  Gram stain showed gram-positive cocci in pairs, gram-negative rods and gram-positive rods.  Cultures grew antibiotic susceptible Proteus and enterococcus.  MRI showed marrow edema and cortical destruction of the left fifth metatarsal head compatible with osteomyelitis.    She continues to take amoxicillin clavulanate and doxycycline.  She has had no problems tolerating her antibiotics but does not feel like her foot is getting better.  When I saw her last week she was not certain if she wanted to go on intravenous antibiotics or whether she would agree to have surgery.  She saw Dr. Jacqualyn Posey, her podiatrist, on 04/17/2017 and discussed the possibility of surgery again.  She saw him again on 05/01/2017 and surgery was scheduled.  She met with Dr. Dellia Nims at the wound center last week and was told that  her wound was looking better so she decided to cancel surgery.  She is not having any problems tolerating her antibiotics.   Review of Systems: Review of Systems  Constitutional: Negative for chills, diaphoresis and fever.  Gastrointestinal: Negative for abdominal pain, diarrhea, nausea and vomiting.  Musculoskeletal: Positive for joint pain.    Past Medical History:  Diagnosis Date  . Anemia    takes Ferrous Sulfate daily  . Arthritis   . Basal cell carcinoma of breast 1998  . Bruises easily   . Dizziness   . DM (diabetes mellitus) ([place])    takes Metformin daily  . Eczema   . Emphysema lung ([place])   . GERD (gastroesophageal reflux disease)    takes Protonix daily  . Glaucoma    Bil  . History of hiatal hernia   . HTN (hypertension)     takes Diovan daily  . Hyperlipidemia   . ITP (idiopathic thrombocytopenic purpura) 2/09  . Joint pain   . Joint swelling   . Nocturia   . Osteoporosis   . Peripheral edema   . Stroke ([place]) 02/03/09   mid brain, diplopia    Social History   Tobacco Use  . Smoking status: Never Smoker  . Smokeless tobacco: Never Used  Substance Use Topics  . Alcohol use: No    Alcohol/week: 0.0 oz  . Drug use: No    Family History  Problem Relation Age of Onset  . Breast cancer Mother 99  . Colon cancer Father   . Diabetes Father        questionable  . Multiple sclerosis Daughter   . Heart disease Maternal Grandfather     Allergies  Allergen Reactions  . Travatan Z [Travoprost (Bak Free)] Other (See Comments)    dizziness    Objective: Vitals:   05/10/17 1347  BP: (!) 161/92  Pulse: 98  Temp: 97.6 F (36.4 C)  TempSrc: Oral  Weight: 190 lb (86.2 kg)   Body mass index is 34.75 kg/m.  Physical Exam  Constitutional: She is oriented to person, place, and time.  She is accompanied by her daughter, [name].  Musculoskeletal: She exhibits edema. She exhibits no tenderness.  She has a compression dressing on her left leg.  She has edema.  The erythema over her left foot has improved.  She still has a dime size ulcer laterally over the fifth metatarsal head but the bed of the ulcer has clean, healthy appearing tissue with no odor.  There is no exposed bone.  Neurological: She is alert and oriented to person, place, and time.  Skin: No rash noted.  Psychiatric: Mood and affect normal.  She is anxious.    Lab Results Sed Rate  Date Value  04/17/2017 9 mm/h  02/04/2009 11 mm/hr   CRP  Date Value  04/17/2017 3.4 mg/L  02/04/2009 0.8 mg/dL (H)     Problem List Items Addressed This Visit      High   Chronic osteomyelitis of left foot ([place])    She is very anxious and conflicted.  On one hand she does not want surgery or IV antibiotics but she also is very concerned about  possible, persistent infection in the bone.  I do not feel that switching to IV antibiotics is needed at this point given that she has had improvement in the wound.  I favor continuing her current oral antibiotics for 2 more weeks.          Michel Bickers, MD [facility] [phone] pager   [phone] cell 05/10/2017, 2:18 PM

## 2017-05-10 NOTE — Assessment & Plan Note (Signed)
She is very anxious and conflicted.  On one hand she does not want surgery or IV antibiotics but she also is very concerned about possible, persistent infection in the bone.  I do not feel that switching to IV antibiotics is needed at this point given that she has had improvement in the wound.  I favor continuing her current oral antibiotics for 2 more weeks.

## 2017-05-11 ENCOUNTER — Encounter (HOSPITAL_BASED_OUTPATIENT_CLINIC_OR_DEPARTMENT_OTHER): Payer: Medicare Other | Attending: Internal Medicine

## 2017-05-11 DIAGNOSIS — L97522 Non-pressure chronic ulcer of other part of left foot with fat layer exposed: Secondary | ICD-10-CM | POA: Diagnosis not present

## 2017-05-11 DIAGNOSIS — I872 Venous insufficiency (chronic) (peripheral): Secondary | ICD-10-CM | POA: Diagnosis not present

## 2017-05-11 DIAGNOSIS — E1169 Type 2 diabetes mellitus with other specified complication: Secondary | ICD-10-CM | POA: Insufficient documentation

## 2017-05-11 DIAGNOSIS — I87332 Chronic venous hypertension (idiopathic) with ulcer and inflammation of left lower extremity: Secondary | ICD-10-CM | POA: Insufficient documentation

## 2017-05-11 DIAGNOSIS — I890 Lymphedema, not elsewhere classified: Secondary | ICD-10-CM | POA: Diagnosis not present

## 2017-05-11 DIAGNOSIS — E11621 Type 2 diabetes mellitus with foot ulcer: Secondary | ICD-10-CM | POA: Diagnosis not present

## 2017-05-11 DIAGNOSIS — M86172 Other acute osteomyelitis, left ankle and foot: Secondary | ICD-10-CM | POA: Insufficient documentation

## 2017-05-11 DIAGNOSIS — E1136 Type 2 diabetes mellitus with diabetic cataract: Secondary | ICD-10-CM | POA: Insufficient documentation

## 2017-05-12 ENCOUNTER — Emergency Department (HOSPITAL_COMMUNITY): Payer: Medicare Other

## 2017-05-12 ENCOUNTER — Encounter (HOSPITAL_COMMUNITY): Payer: Self-pay | Admitting: Nurse Practitioner

## 2017-05-12 ENCOUNTER — Inpatient Hospital Stay (HOSPITAL_COMMUNITY)
Admission: EM | Admit: 2017-05-12 | Discharge: 2017-05-29 | DRG: 280 | Disposition: A | Discharge status: Skilled Nursing Facility | Payer: Medicare Other | Attending: Internal Medicine | Admitting: Internal Medicine

## 2017-05-12 DIAGNOSIS — Z6830 Body mass index (BMI) 30.0-30.9, adult: Secondary | ICD-10-CM

## 2017-05-12 DIAGNOSIS — F0390 Unspecified dementia without behavioral disturbance: Secondary | ICD-10-CM | POA: Diagnosis present

## 2017-05-12 DIAGNOSIS — D649 Anemia, unspecified: Secondary | ICD-10-CM | POA: Diagnosis present

## 2017-05-12 DIAGNOSIS — E785 Hyperlipidemia, unspecified: Secondary | ICD-10-CM | POA: Diagnosis present

## 2017-05-12 DIAGNOSIS — I10 Essential (primary) hypertension: Secondary | ICD-10-CM | POA: Diagnosis present

## 2017-05-12 DIAGNOSIS — M86672 Other chronic osteomyelitis, left ankle and foot: Secondary | ICD-10-CM | POA: Diagnosis present

## 2017-05-12 DIAGNOSIS — M19072 Primary osteoarthritis, left ankle and foot: Secondary | ICD-10-CM | POA: Diagnosis present

## 2017-05-12 DIAGNOSIS — Z8249 Family history of ischemic heart disease and other diseases of the circulatory system: Secondary | ICD-10-CM

## 2017-05-12 DIAGNOSIS — E8809 Other disorders of plasma-protein metabolism, not elsewhere classified: Secondary | ICD-10-CM | POA: Diagnosis present

## 2017-05-12 DIAGNOSIS — I252 Old myocardial infarction: Secondary | ICD-10-CM

## 2017-05-12 DIAGNOSIS — M25511 Pain in right shoulder: Secondary | ICD-10-CM | POA: Diagnosis not present

## 2017-05-12 DIAGNOSIS — K5900 Constipation, unspecified: Secondary | ICD-10-CM | POA: Diagnosis not present

## 2017-05-12 DIAGNOSIS — L089 Local infection of the skin and subcutaneous tissue, unspecified: Secondary | ICD-10-CM

## 2017-05-12 DIAGNOSIS — R52 Pain, unspecified: Secondary | ICD-10-CM

## 2017-05-12 DIAGNOSIS — N170 Acute kidney failure with tubular necrosis: Secondary | ICD-10-CM | POA: Diagnosis not present

## 2017-05-12 DIAGNOSIS — L8990 Pressure ulcer of unspecified site, unspecified stage: Secondary | ICD-10-CM

## 2017-05-12 DIAGNOSIS — Z79899 Other long term (current) drug therapy: Secondary | ICD-10-CM

## 2017-05-12 DIAGNOSIS — Z7984 Long term (current) use of oral hypoglycemic drugs: Secondary | ICD-10-CM

## 2017-05-12 DIAGNOSIS — E875 Hyperkalemia: Secondary | ICD-10-CM | POA: Diagnosis not present

## 2017-05-12 DIAGNOSIS — T368X5A Adverse effect of other systemic antibiotics, initial encounter: Secondary | ICD-10-CM | POA: Diagnosis not present

## 2017-05-12 DIAGNOSIS — M609 Myositis, unspecified: Secondary | ICD-10-CM | POA: Diagnosis present

## 2017-05-12 DIAGNOSIS — M810 Age-related osteoporosis without current pathological fracture: Secondary | ICD-10-CM | POA: Diagnosis present

## 2017-05-12 DIAGNOSIS — R319 Hematuria, unspecified: Secondary | ICD-10-CM | POA: Diagnosis present

## 2017-05-12 DIAGNOSIS — I509 Heart failure, unspecified: Secondary | ICD-10-CM

## 2017-05-12 DIAGNOSIS — I214 Non-ST elevation (NSTEMI) myocardial infarction: Principal | ICD-10-CM | POA: Diagnosis present

## 2017-05-12 DIAGNOSIS — E669 Obesity, unspecified: Secondary | ICD-10-CM | POA: Diagnosis present

## 2017-05-12 DIAGNOSIS — E1169 Type 2 diabetes mellitus with other specified complication: Secondary | ICD-10-CM | POA: Diagnosis present

## 2017-05-12 DIAGNOSIS — R404 Transient alteration of awareness: Secondary | ICD-10-CM | POA: Diagnosis not present

## 2017-05-12 DIAGNOSIS — I6789 Other cerebrovascular disease: Secondary | ICD-10-CM | POA: Diagnosis not present

## 2017-05-12 DIAGNOSIS — M7989 Other specified soft tissue disorders: Secondary | ICD-10-CM | POA: Diagnosis not present

## 2017-05-12 DIAGNOSIS — T148XXA Other injury of unspecified body region, initial encounter: Secondary | ICD-10-CM

## 2017-05-12 DIAGNOSIS — L97509 Non-pressure chronic ulcer of other part of unspecified foot with unspecified severity: Secondary | ICD-10-CM | POA: Diagnosis present

## 2017-05-12 DIAGNOSIS — I878 Other specified disorders of veins: Secondary | ICD-10-CM | POA: Diagnosis present

## 2017-05-12 DIAGNOSIS — E119 Type 2 diabetes mellitus without complications: Secondary | ICD-10-CM

## 2017-05-12 DIAGNOSIS — I5043 Acute on chronic combined systolic (congestive) and diastolic (congestive) heart failure: Secondary | ICD-10-CM | POA: Diagnosis not present

## 2017-05-12 DIAGNOSIS — Z7982 Long term (current) use of aspirin: Secondary | ICD-10-CM

## 2017-05-12 DIAGNOSIS — T8140XA Infection following a procedure, unspecified, initial encounter: Secondary | ICD-10-CM | POA: Diagnosis not present

## 2017-05-12 DIAGNOSIS — Z888 Allergy status to other drugs, medicaments and biological substances status: Secondary | ICD-10-CM

## 2017-05-12 DIAGNOSIS — E11621 Type 2 diabetes mellitus with foot ulcer: Secondary | ICD-10-CM | POA: Diagnosis present

## 2017-05-12 DIAGNOSIS — Z8673 Personal history of transient ischemic attack (TIA), and cerebral infarction without residual deficits: Secondary | ICD-10-CM

## 2017-05-12 DIAGNOSIS — R0989 Other specified symptoms and signs involving the circulatory and respiratory systems: Secondary | ICD-10-CM | POA: Diagnosis not present

## 2017-05-12 DIAGNOSIS — Z833 Family history of diabetes mellitus: Secondary | ICD-10-CM

## 2017-05-12 DIAGNOSIS — I959 Hypotension, unspecified: Secondary | ICD-10-CM | POA: Diagnosis present

## 2017-05-12 DIAGNOSIS — I2510 Atherosclerotic heart disease of native coronary artery without angina pectoris: Secondary | ICD-10-CM | POA: Diagnosis present

## 2017-05-12 DIAGNOSIS — K219 Gastro-esophageal reflux disease without esophagitis: Secondary | ICD-10-CM | POA: Diagnosis present

## 2017-05-12 DIAGNOSIS — J439 Emphysema, unspecified: Secondary | ICD-10-CM | POA: Diagnosis present

## 2017-05-12 DIAGNOSIS — I110 Hypertensive heart disease with heart failure: Secondary | ICD-10-CM | POA: Diagnosis present

## 2017-05-12 DIAGNOSIS — D693 Immune thrombocytopenic purpura: Secondary | ICD-10-CM | POA: Diagnosis present

## 2017-05-12 DIAGNOSIS — I6782 Cerebral ischemia: Secondary | ICD-10-CM | POA: Diagnosis not present

## 2017-05-12 DIAGNOSIS — R531 Weakness: Secondary | ICD-10-CM | POA: Diagnosis not present

## 2017-05-12 DIAGNOSIS — N179 Acute kidney failure, unspecified: Secondary | ICD-10-CM

## 2017-05-12 DIAGNOSIS — N141 Nephropathy induced by other drugs, medicaments and biological substances: Secondary | ICD-10-CM | POA: Diagnosis not present

## 2017-05-12 DIAGNOSIS — M19011 Primary osteoarthritis, right shoulder: Secondary | ICD-10-CM | POA: Diagnosis present

## 2017-05-12 MED ORDER — VANCOMYCIN HCL IN DEXTROSE 1-5 GM/200ML-% IV SOLN
1000.0000 mg | Freq: Once | INTRAVENOUS | Status: AC
  Administered 2017-05-13: 1000 mg via INTRAVENOUS
  Filled 2017-05-12: qty 200

## 2017-05-12 MED ORDER — PIPERACILLIN-TAZOBACTAM 3.375 G IVPB 30 MIN
3.3750 g | Freq: Once | INTRAVENOUS | Status: AC
  Administered 2017-05-13: 3.375 g via INTRAVENOUS
  Filled 2017-05-12: qty 50

## 2017-05-12 MED ORDER — SODIUM CHLORIDE 0.9 % IV BOLUS
500.0000 mL | Freq: Once | INTRAVENOUS | Status: AC
  Administered 2017-05-13: 500 mL via INTRAVENOUS

## 2017-05-12 NOTE — ED Triage Notes (Signed)
Pt is c/o right shoulder pain, reason she called EMS, medics report she was hypotensive on scene systolic of 97D, denies any other complaints. Hx of chronic osteomyelitis of left foot.

## 2017-05-12 NOTE — ED Notes (Signed)
Patient transported to X-ray 

## 2017-05-12 NOTE — ED Notes (Signed)
Bed: AQ76 Expected date:  Expected time:  Means of arrival:  Comments: 84 yo F/Fall-hypotensive

## 2017-05-13 ENCOUNTER — Emergency Department (HOSPITAL_COMMUNITY): Payer: Medicare Other

## 2017-05-13 ENCOUNTER — Encounter (HOSPITAL_COMMUNITY): Payer: Self-pay | Admitting: Emergency Medicine

## 2017-05-13 ENCOUNTER — Inpatient Hospital Stay (HOSPITAL_COMMUNITY): Payer: Medicare Other

## 2017-05-13 DIAGNOSIS — R600 Localized edema: Secondary | ICD-10-CM | POA: Diagnosis not present

## 2017-05-13 DIAGNOSIS — R488 Other symbolic dysfunctions: Secondary | ICD-10-CM | POA: Diagnosis not present

## 2017-05-13 DIAGNOSIS — Z6830 Body mass index (BMI) 30.0-30.9, adult: Secondary | ICD-10-CM | POA: Diagnosis not present

## 2017-05-13 DIAGNOSIS — K219 Gastro-esophageal reflux disease without esophagitis: Secondary | ICD-10-CM | POA: Diagnosis present

## 2017-05-13 DIAGNOSIS — R1311 Dysphagia, oral phase: Secondary | ICD-10-CM | POA: Diagnosis not present

## 2017-05-13 DIAGNOSIS — I214 Non-ST elevation (NSTEMI) myocardial infarction: Secondary | ICD-10-CM | POA: Diagnosis present

## 2017-05-13 DIAGNOSIS — N179 Acute kidney failure, unspecified: Secondary | ICD-10-CM | POA: Diagnosis not present

## 2017-05-13 DIAGNOSIS — I6782 Cerebral ischemia: Secondary | ICD-10-CM | POA: Diagnosis not present

## 2017-05-13 DIAGNOSIS — R609 Edema, unspecified: Secondary | ICD-10-CM | POA: Diagnosis not present

## 2017-05-13 DIAGNOSIS — M86672 Other chronic osteomyelitis, left ankle and foot: Secondary | ICD-10-CM | POA: Diagnosis not present

## 2017-05-13 DIAGNOSIS — I5043 Acute on chronic combined systolic (congestive) and diastolic (congestive) heart failure: Secondary | ICD-10-CM | POA: Diagnosis not present

## 2017-05-13 DIAGNOSIS — M79631 Pain in right forearm: Secondary | ICD-10-CM | POA: Diagnosis not present

## 2017-05-13 DIAGNOSIS — M1990 Unspecified osteoarthritis, unspecified site: Secondary | ICD-10-CM | POA: Diagnosis not present

## 2017-05-13 DIAGNOSIS — N141 Nephropathy induced by other drugs, medicaments and biological substances: Secondary | ICD-10-CM | POA: Diagnosis not present

## 2017-05-13 DIAGNOSIS — M25511 Pain in right shoulder: Secondary | ICD-10-CM | POA: Diagnosis not present

## 2017-05-13 DIAGNOSIS — M810 Age-related osteoporosis without current pathological fracture: Secondary | ICD-10-CM | POA: Diagnosis not present

## 2017-05-13 DIAGNOSIS — Z7984 Long term (current) use of oral hypoglycemic drugs: Secondary | ICD-10-CM | POA: Diagnosis not present

## 2017-05-13 DIAGNOSIS — M19072 Primary osteoarthritis, left ankle and foot: Secondary | ICD-10-CM | POA: Diagnosis not present

## 2017-05-13 DIAGNOSIS — G8911 Acute pain due to trauma: Secondary | ICD-10-CM | POA: Diagnosis not present

## 2017-05-13 DIAGNOSIS — N170 Acute kidney failure with tubular necrosis: Secondary | ICD-10-CM | POA: Diagnosis not present

## 2017-05-13 DIAGNOSIS — I10 Essential (primary) hypertension: Secondary | ICD-10-CM | POA: Diagnosis present

## 2017-05-13 DIAGNOSIS — E669 Obesity, unspecified: Secondary | ICD-10-CM | POA: Diagnosis not present

## 2017-05-13 DIAGNOSIS — L089 Local infection of the skin and subcutaneous tissue, unspecified: Secondary | ICD-10-CM

## 2017-05-13 DIAGNOSIS — R4182 Altered mental status, unspecified: Secondary | ICD-10-CM | POA: Diagnosis not present

## 2017-05-13 DIAGNOSIS — T368X5A Adverse effect of other systemic antibiotics, initial encounter: Secondary | ICD-10-CM | POA: Diagnosis not present

## 2017-05-13 DIAGNOSIS — J439 Emphysema, unspecified: Secondary | ICD-10-CM | POA: Diagnosis present

## 2017-05-13 DIAGNOSIS — I361 Nonrheumatic tricuspid (valve) insufficiency: Secondary | ICD-10-CM | POA: Diagnosis not present

## 2017-05-13 DIAGNOSIS — E875 Hyperkalemia: Secondary | ICD-10-CM | POA: Diagnosis not present

## 2017-05-13 DIAGNOSIS — R319 Hematuria, unspecified: Secondary | ICD-10-CM | POA: Diagnosis present

## 2017-05-13 DIAGNOSIS — D649 Anemia, unspecified: Secondary | ICD-10-CM | POA: Diagnosis not present

## 2017-05-13 DIAGNOSIS — M79601 Pain in right arm: Secondary | ICD-10-CM | POA: Diagnosis not present

## 2017-05-13 DIAGNOSIS — I959 Hypotension, unspecified: Secondary | ICD-10-CM | POA: Diagnosis not present

## 2017-05-13 DIAGNOSIS — H409 Unspecified glaucoma: Secondary | ICD-10-CM | POA: Diagnosis not present

## 2017-05-13 DIAGNOSIS — D693 Immune thrombocytopenic purpura: Secondary | ICD-10-CM | POA: Diagnosis not present

## 2017-05-13 DIAGNOSIS — R0602 Shortness of breath: Secondary | ICD-10-CM | POA: Diagnosis not present

## 2017-05-13 DIAGNOSIS — F0390 Unspecified dementia without behavioral disturbance: Secondary | ICD-10-CM | POA: Diagnosis present

## 2017-05-13 DIAGNOSIS — I110 Hypertensive heart disease with heart failure: Secondary | ICD-10-CM | POA: Diagnosis present

## 2017-05-13 DIAGNOSIS — M19011 Primary osteoarthritis, right shoulder: Secondary | ICD-10-CM | POA: Diagnosis not present

## 2017-05-13 DIAGNOSIS — I6789 Other cerebrovascular disease: Secondary | ICD-10-CM | POA: Diagnosis not present

## 2017-05-13 DIAGNOSIS — R351 Nocturia: Secondary | ICD-10-CM | POA: Diagnosis not present

## 2017-05-13 DIAGNOSIS — E1169 Type 2 diabetes mellitus with other specified complication: Secondary | ICD-10-CM | POA: Diagnosis not present

## 2017-05-13 DIAGNOSIS — E785 Hyperlipidemia, unspecified: Secondary | ICD-10-CM | POA: Diagnosis present

## 2017-05-13 DIAGNOSIS — E8770 Fluid overload, unspecified: Secondary | ICD-10-CM | POA: Diagnosis not present

## 2017-05-13 DIAGNOSIS — E1152 Type 2 diabetes mellitus with diabetic peripheral angiopathy with gangrene: Secondary | ICD-10-CM | POA: Diagnosis not present

## 2017-05-13 DIAGNOSIS — T148XXA Other injury of unspecified body region, initial encounter: Secondary | ICD-10-CM

## 2017-05-13 DIAGNOSIS — M6281 Muscle weakness (generalized): Secondary | ICD-10-CM | POA: Diagnosis not present

## 2017-05-13 DIAGNOSIS — E8809 Other disorders of plasma-protein metabolism, not elsewhere classified: Secondary | ICD-10-CM | POA: Diagnosis present

## 2017-05-13 DIAGNOSIS — I2510 Atherosclerotic heart disease of native coronary artery without angina pectoris: Secondary | ICD-10-CM | POA: Diagnosis present

## 2017-05-13 DIAGNOSIS — Z853 Personal history of malignant neoplasm of breast: Secondary | ICD-10-CM | POA: Diagnosis not present

## 2017-05-13 LAB — CBC WITH DIFFERENTIAL/PLATELET
BASOS ABS: 0 10*3/uL (ref 0.0–0.1)
BASOS ABS: 0 10*3/uL (ref 0.0–0.1)
BASOS PCT: 0 %
BASOS PCT: 0 %
EOS ABS: 0 10*3/uL (ref 0.0–0.7)
EOS PCT: 0 %
Eosinophils Absolute: 0 10*3/uL (ref 0.0–0.7)
Eosinophils Relative: 0 %
HCT: 37.5 % (ref 36.0–46.0)
HEMATOCRIT: 49.5 % — AB (ref 36.0–46.0)
HEMOGLOBIN: 16.3 g/dL — AB (ref 12.0–15.0)
Hemoglobin: 12.1 g/dL (ref 12.0–15.0)
LYMPHS PCT: 10 %
Lymphocytes Relative: 9 %
Lymphs Abs: 0.8 10*3/uL (ref 0.7–4.0)
Lymphs Abs: 1.2 10*3/uL (ref 0.7–4.0)
MCH: 28.1 pg (ref 26.0–34.0)
MCH: 27.3 pg (ref 26.0–34.0)
MCHC: 32.9 g/dL (ref 30.0–36.0)
MCHC: 32.3 g/dL (ref 30.0–36.0)
MCV: 85.3 fL (ref 78.0–100.0)
MCV: 84.7 fL (ref 78.0–100.0)
Monocytes Absolute: 0.7 10*3/uL (ref 0.1–1.0)
Monocytes Absolute: 1.5 10*3/uL — ABNORMAL HIGH (ref 0.1–1.0)
Monocytes Relative: 7 %
Monocytes Relative: 12 %
NEUTROS ABS: 8.1 10*3/uL — AB (ref 1.7–7.7)
NEUTROS PCT: 84 %
Neutro Abs: 9.2 10*3/uL — ABNORMAL HIGH (ref 1.7–7.7)
Neutrophils Relative %: 78 %
PLATELETS: 234 10*3/uL (ref 150–400)
Platelets: 214 10*3/uL (ref 150–400)
RBC: 5.8 MIL/uL — AB (ref 3.87–5.11)
RBC: 4.43 MIL/uL (ref 3.87–5.11)
RDW: 14.1 % (ref 11.5–15.5)
RDW: 14.2 % (ref 11.5–15.5)
WBC: 9.6 10*3/uL (ref 4.0–10.5)
WBC: 11.9 10*3/uL — AB (ref 4.0–10.5)

## 2017-05-13 LAB — I-STAT CHEM 8, ED
BUN: 15 mg/dL (ref 6–20)
Calcium, Ion: 1.06 mmol/L — ABNORMAL LOW (ref 1.15–1.40)
Chloride: 100 mmol/L — ABNORMAL LOW (ref 101–111)
Creatinine, Ser: 0.8 mg/dL (ref 0.44–1.00)
Glucose, Bld: 283 mg/dL — ABNORMAL HIGH (ref 65–99)
HCT: 60 % — ABNORMAL HIGH (ref 36.0–46.0)
Hemoglobin: 20.4 g/dL — ABNORMAL HIGH (ref 12.0–15.0)
Potassium: 4.3 mmol/L (ref 3.5–5.1)
Sodium: 135 mmol/L (ref 135–145)
TCO2: 22 mmol/L (ref 22–32)

## 2017-05-13 LAB — TROPONIN I
TROPONIN I: 5.15 ng/mL — AB (ref <0.03)
Troponin I: 5.87 ng/mL (ref <0.03)

## 2017-05-13 LAB — LIPID PANEL
Cholesterol: 113 mg/dL (ref 0–200)
HDL: 52 mg/dL (ref >40)
LDL CALC: 55 mg/dL (ref 0–99)
Total CHOL/HDL Ratio: 2.2 RATIO
Triglycerides: 28 mg/dL (ref <150)
VLDL: 6 mg/dL (ref 0–40)

## 2017-05-13 LAB — HEPARIN LEVEL (UNFRACTIONATED): HEPARIN UNFRACTIONATED: 0.11 [IU]/mL — AB (ref 0.30–0.70)

## 2017-05-13 LAB — I-STAT TROPONIN, ED: Troponin i, poc: 3.52 ng/mL (ref 0.00–0.08)

## 2017-05-13 LAB — LACTIC ACID, PLASMA
LACTIC ACID, VENOUS: 1.4 mmol/L (ref 0.5–1.9)
Lactic Acid, Venous: 1.6 mmol/L (ref 0.5–1.9)

## 2017-05-13 LAB — GLUCOSE, CAPILLARY
GLUCOSE-CAPILLARY: 160 mg/dL — AB (ref 65–99)
GLUCOSE-CAPILLARY: 161 mg/dL — AB (ref 65–99)
GLUCOSE-CAPILLARY: 225 mg/dL — AB (ref 65–99)
GLUCOSE-CAPILLARY: 239 mg/dL — AB (ref 65–99)

## 2017-05-13 LAB — CBG MONITORING, ED
GLUCOSE-CAPILLARY: 255 mg/dL — AB (ref 65–99)
GLUCOSE-CAPILLARY: 178 mg/dL — AB (ref 65–99)

## 2017-05-13 MED ORDER — HEPARIN (PORCINE) IN NACL 100-0.45 UNIT/ML-% IJ SOLN
1300.0000 [IU]/h | INTRAMUSCULAR | Status: DC
  Administered 2017-05-13: 750 [IU]/h via INTRAVENOUS
  Administered 2017-05-13: 950 [IU]/h via INTRAVENOUS
  Administered 2017-05-14: 1250 [IU]/h via INTRAVENOUS
  Administered 2017-05-15 – 2017-05-16 (×2): 1300 [IU]/h via INTRAVENOUS
  Filled 2017-05-13 (×6): qty 250

## 2017-05-13 MED ORDER — ASPIRIN EC 81 MG PO TBEC: 81.0000 mg | DELAYED_RELEASE_TABLET | Freq: Every day | ORAL | Status: DC

## 2017-05-13 MED ORDER — NITROGLYCERIN 0.4 MG SL SUBL: 0.4000 mg | SUBLINGUAL_TABLET | SUBLINGUAL | Status: DC | PRN

## 2017-05-13 MED ORDER — ASPIRIN 81 MG PO CHEW: 324.0000 mg | CHEWABLE_TABLET | ORAL | Status: DC

## 2017-05-13 MED ORDER — HEPARIN BOLUS VIA INFUSION
4000.0000 [IU] | Freq: Once | INTRAVENOUS | Status: AC
  Administered 2017-05-13: 4000 [IU] via INTRAVENOUS
  Filled 2017-05-13: qty 4000

## 2017-05-13 MED ORDER — ASPIRIN 81 MG PO CHEW
324.0000 mg | CHEWABLE_TABLET | Freq: Once | ORAL | Status: AC
  Administered 2017-05-13: 324 mg via ORAL
  Filled 2017-05-13: qty 4

## 2017-05-13 MED ORDER — VITAMIN D 1000 UNITS PO TABS
2000.0000 [IU] | ORAL_TABLET | Freq: Every day | ORAL | Status: DC
  Administered 2017-05-13 – 2017-05-29 (×17): 2000 [IU] via ORAL
  Filled 2017-05-13 (×17): qty 2

## 2017-05-13 MED ORDER — HEPARIN BOLUS VIA INFUSION
2000.0000 [IU] | Freq: Once | INTRAVENOUS | Status: AC
Start: 2017-05-13 — End: 2017-05-13
  Administered 2017-05-13: 2000 [IU] via INTRAVENOUS
  Filled 2017-05-13: qty 2000

## 2017-05-13 MED ORDER — SODIUM CHLORIDE 0.9 % IV BOLUS
500.0000 mL | Freq: Once | INTRAVENOUS | Status: AC
  Administered 2017-05-14: 500 mL via INTRAVENOUS

## 2017-05-13 MED ORDER — ASPIRIN 300 MG RE SUPP: 300.0000 mg | RECTAL | Status: DC

## 2017-05-13 MED ORDER — ASPIRIN 81 MG PO CHEW
81.0000 mg | CHEWABLE_TABLET | Freq: Every day | ORAL | Status: DC
  Administered 2017-05-13 – 2017-05-29 (×17): 81 mg via ORAL
  Filled 2017-05-13 (×17): qty 1

## 2017-05-13 MED ORDER — ATORVASTATIN CALCIUM 40 MG PO TABS
40.0000 mg | ORAL_TABLET | Freq: Every day | ORAL | Status: DC
  Administered 2017-05-13 – 2017-05-16 (×4): 40 mg via ORAL
  Filled 2017-05-13 (×4): qty 1

## 2017-05-13 MED ORDER — SODIUM CHLORIDE 0.9 % IV BOLUS
500.0000 mL | Freq: Once | INTRAVENOUS | Status: AC
  Administered 2017-05-13: 500 mL via INTRAVENOUS

## 2017-05-13 MED ORDER — VANCOMYCIN HCL IN DEXTROSE 750-5 MG/150ML-% IV SOLN
750.0000 mg | Freq: Two times a day (BID) | INTRAVENOUS | Status: DC
  Administered 2017-05-13 – 2017-05-14 (×2): 750 mg via INTRAVENOUS
  Filled 2017-05-13 (×3): qty 150

## 2017-05-13 MED ORDER — INSULIN ASPART 100 UNIT/ML ~~LOC~~ SOLN
0.0000 [IU] | SUBCUTANEOUS | Status: DC
  Administered 2017-05-13: 5 [IU] via SUBCUTANEOUS
  Administered 2017-05-13: 3 [IU] via SUBCUTANEOUS
  Administered 2017-05-13 (×2): 2 [IU] via SUBCUTANEOUS
  Administered 2017-05-13: 3 [IU] via SUBCUTANEOUS
  Administered 2017-05-14 (×5): 2 [IU] via SUBCUTANEOUS
  Filled 2017-05-13 (×2): qty 1

## 2017-05-13 MED ORDER — ONDANSETRON HCL 4 MG/2ML IJ SOLN: 4.0000 mg | Freq: Four times a day (QID) | INTRAMUSCULAR | Status: DC | PRN

## 2017-05-13 MED ORDER — VANCOMYCIN HCL IN DEXTROSE 1-5 GM/200ML-% IV SOLN
1000.0000 mg | INTRAVENOUS | Status: DC
  Filled 2017-05-13: qty 200

## 2017-05-13 MED ORDER — ACETAMINOPHEN 325 MG PO TABS
650.0000 mg | ORAL_TABLET | ORAL | Status: DC | PRN
  Administered 2017-05-14: 650 mg via ORAL
  Filled 2017-05-13: qty 2

## 2017-05-13 MED ORDER — OMEGA-3-ACID ETHYL ESTERS 1 G PO CAPS
1000.0000 mg | ORAL_CAPSULE | Freq: Every day | ORAL | Status: DC
  Administered 2017-05-13 – 2017-05-29 (×17): 1000 mg via ORAL
  Filled 2017-05-13 (×17): qty 1

## 2017-05-13 MED ORDER — PIPERACILLIN-TAZOBACTAM 3.375 G IVPB
3.3750 g | Freq: Three times a day (TID) | INTRAVENOUS | Status: DC
  Administered 2017-05-13 – 2017-05-14 (×4): 3.375 g via INTRAVENOUS
  Filled 2017-05-13 (×5): qty 50

## 2017-05-13 MED ORDER — PANTOPRAZOLE SODIUM 40 MG PO TBEC
40.0000 mg | DELAYED_RELEASE_TABLET | Freq: Every day | ORAL | Status: DC
  Administered 2017-05-13 – 2017-05-29 (×17): 40 mg via ORAL
  Filled 2017-05-13 (×17): qty 1

## 2017-05-13 NOTE — H&P (Addendum)
History and Physical    Chelsea [name] GYJ:[id number] DOB: 1928-10-06 DOA: 05/12/2017  PCP: Jani Gravel, MD  Patient coming from: Home  I have personally briefly reviewed patient's old medical records in [place]  Chief Complaint: Shoulder pain, hypotension  HPI: Chelsea [name] is a 84 y.o. female with medical history significant of DM, HTN, chronic osteomyelitis of L foot currently being treated with PO ABx.  Patient presents to the ED with c/o R shoulder pain.  She apparently called EMS after being unable to get out of bed.  To me she denies fall (different than EDP history).  To me she states that R shoulder pain has been ongoing for about 1 week.  L shoulder also started to hurt around Thurs.  She reports trouble getting out of chairs and out of bed recently.  (I suspect patient may have underlying dementia of some degree vs delirium.)  Per EMS initial SBP was 70.  ED Course: Trop is 3.5.  EKG shows inferior ST depressions.  Patient given zosyn / vanc for her foot ulcer by EDP.  Cultures ordered.  Cards consulted and hospitalist asked to admit.   Review of Systems: As per HPI otherwise 10 point review of systems negative.   Past Medical History:  Diagnosis Date  . Anemia    takes Ferrous Sulfate daily  . Arthritis   . Basal cell carcinoma of breast 1998  . Bruises easily   . Dizziness   . DM (diabetes mellitus) ([place])    takes Metformin daily  . Eczema   . Emphysema lung ([place])   . GERD (gastroesophageal reflux disease)    takes Protonix daily  . Glaucoma    Bil  . History of hiatal hernia   . HTN (hypertension)    takes Diovan daily  . Hyperlipidemia   . ITP (idiopathic thrombocytopenic purpura) 2/09  . Joint pain   . Joint swelling   . Nocturia   . Osteoporosis   . Peripheral edema   . Stroke ([place]) 02/03/09   mid brain, diplopia    Past Surgical History:  Procedure Laterality Date  . BREAST LUMPECTOMY WITH RADIOACTIVE SEED LOCALIZATION Right  10/23/2014   Procedure: RIGHT BREAST LUMPECTOMY WITH RADIOACTIVE SEED LOCALIZATION;  Surgeon: Fanny Skates, MD;  Location: [facility];  Service: General;  Laterality: Right;  . CATARACT EXTRACTION Bilateral   . COLONOSCOPY    . ESOPHAGOGASTRODUODENOSCOPY    . MYOMECTOMY N/A 03/14/1995   late 90's per pt  . PARATHYROIDECTOMY  2008  . REFRACTIVE SURGERY Bilateral   . TONSILLECTOMY  as a child     reports that she has never smoked. She has never used smokeless tobacco. She reports that she does not drink alcohol or use drugs.  Allergies  Allergen Reactions  . Travatan Z [Travoprost (Bak Free)] Other (See Comments)    dizziness    Family History  Problem Relation Age of Onset  . Breast cancer Mother 74  . Colon cancer Father   . Diabetes Father        questionable  . Multiple sclerosis Daughter   . Heart disease Maternal Grandfather      Prior to Admission medications   Medication Sig Start Date End Date Taking? Authorizing Provider  amoxicillin-clavulanate (AUGMENTIN) 875-125 MG tablet Take 1 tablet by mouth 2 (two) times daily. 05/10/17  Yes Michel Bickers, MD  aspirin 81 MG tablet Take 81 mg by mouth daily after breakfast.    Yes [provider]  Cholecalciferol (VITAMIN D3) 2000 UNITS TABS Take 2,000 Units by mouth daily.   Yes [provider]  doxycycline (MONODOX) 100 MG capsule Take 1 capsule (100 mg total) by mouth 2 (two) times daily. 05/10/17  Yes Michel Bickers, MD  furosemide (LASIX) 20 MG tablet Take 20 mg by mouth daily as needed for fluid or edema.  04/24/17  Yes [provider]  losartan (COZAAR) 100 MG tablet TAKE 1 TABLET EVERY DAY FOR BLOOD PRESSURE 03/23/17  Yes [provider]  metFORMIN (GLUCOPHAGE) 500 MG tablet Take 500 mg by mouth 2 (two) times daily with a meal.   Yes [provider]  Omega 3 1200 MG CAPS Take 1,200 mg by mouth 2 (two) times daily.    Yes [provider]  pantoprazole (PROTONIX) 40 MG tablet Take  40 mg by mouth daily after breakfast.    Yes [provider]    Physical Exam: Vitals:   05/12/17 2315 05/13/17 0158  BP: (!) 111/96 (!) 139/125  Pulse: (!) 102 (!) 101  Resp: (!) 21 (!) 26  Temp: (!) 97.3 F (36.3 C)   TempSrc: Oral   SpO2: 98% 98%    Constitutional: NAD, calm, comfortable Eyes: PERRL, lids and conjunctivae normal ENMT: Mucous membranes are moist. Posterior pharynx clear of any exudate or lesions.Normal dentition.  Neck: normal, supple, no masses, no thyromegaly Respiratory: clear to auscultation bilaterally, no wheezing, no crackles. Normal respiratory effort. No accessory muscle use.  Cardiovascular: Regular rate and rhythm, no murmurs / rubs / gallops. No extremity edema. 2+ pedal pulses. No carotid bruits.  Abdomen: no tenderness, no masses palpated. No hepatosplenomegaly. Bowel sounds positive.  Musculoskeletal: no clubbing / cyanosis. No joint deformity upper and lower extremities. Good ROM, no contractures. Normal muscle tone.  Skin: ~2cm ulcer on lateral part of plantar surface of L foot. Neurologic: CN 2-12 grossly intact. Sensation intact, DTR normal. Strength 5/5 in all 4.  Psychiatric: Normal judgment and insight. Alert and oriented x 3. Normal mood.    Labs on Admission: I have personally reviewed following labs and imaging studies  CBC: Recent Labs  Lab 05/12/17 2326 05/13/17 0058  WBC 9.6  --   NEUTROABS 8.1*  --   HGB 16.3* 20.4*  HCT 49.5* 60.0*  MCV 85.3  --   PLT 214  --    Basic Metabolic Panel: Recent Labs  Lab 05/13/17 0058  NA 135  K 4.3  CL 100*  GLUCOSE 283*  BUN 15  CREATININE 0.80   GFR: Estimated Creatinine Clearance: 51.4 mL/min (by C-G formula based on SCr of 0.8 mg/dL). Liver Function Tests: No results for input(s): AST, ALT, ALKPHOS, BILITOT, PROT, ALBUMIN in the last 168 hours. No results for input(s): LIPASE, AMYLASE in the last 168 hours. No results for input(s): AMMONIA in the last 168  hours. Coagulation Profile: No results for input(s): INR, PROTIME in the last 168 hours. Cardiac Enzymes: No results for input(s): CKTOTAL, CKMB, CKMBINDEX, TROPONINI in the last 168 hours. BNP (last 3 results) No results for input(s): PROBNP in the last 8760 hours. HbA1C: No results for input(s): HGBA1C in the last 72 hours. CBG: No results for input(s): GLUCAP in the last 168 hours. Lipid Profile: No results for input(s): CHOL, HDL, LDLCALC, TRIG, CHOLHDL, LDLDIRECT in the last 72 hours. Thyroid Function Tests: No results for input(s): TSH, T4TOTAL, FREET4, T3FREE, THYROIDAB in the last 72 hours. Anemia Panel: No results for input(s): VITAMINB12, FOLATE, FERRITIN, TIBC, IRON, RETICCTPCT in the  last 72 hours. Urine analysis: No results found for: COLORURINE, APPEARANCEUR, LABSPEC, PHURINE, GLUCOSEU, HGBUR, BILIRUBINUR, KETONESUR, PROTEINUR, UROBILINOGEN, NITRITE, LEUKOCYTESUR  Radiological Exams on Admission: Dg Chest 2 View  Result Date: 05/13/2017 CLINICAL DATA:  84 y/o  F; right shoulder pain.  Hypotension. EXAM: CHEST - 2 VIEW COMPARISON:  01/09/2008 chest CT. FINDINGS: Stable enlarged cardiac silhouette given projection and technique. Calcific aortic atherosclerosis. Large hiatal hernia. Pulmonary vascular congestion. No pleural effusion or pneumothorax. Bones are unremarkable. IMPRESSION: Large hiatal hernia. Pulmonary vascular congestion. Mild cardiomegaly. Aortic atherosclerosis. Electronically Signed   By: Kristine Garbe M.D.   On: 05/13/2017 00:13   Dg Shoulder Right  Result Date: 05/13/2017 CLINICAL DATA:  84 y/o  F; 2 weeks of right shoulder pain. EXAM: RIGHT SHOULDER - 2+ VIEW COMPARISON:  None. FINDINGS: There is no evidence of fracture or dislocation. Mild osteoarthrosis of the glenohumeral joint with small osteophytes at the inferior margin of humeral head. Soft tissues are unremarkable. IMPRESSION: 1.  No acute fracture or dislocation identified. 2. Mild  osteoarthrosis of the glenohumeral joint. Electronically Signed   By: Kristine Garbe M.D.   On: 05/13/2017 00:16   Ct Head Wo Contrast  Result Date: 05/13/2017 CLINICAL DATA:  Hypotensive. History of stroke in 2010. Right shoulder pain. EXAM: CT HEAD WITHOUT CONTRAST CT CERVICAL SPINE WITHOUT CONTRAST TECHNIQUE: Multidetector CT imaging of the head and cervical spine was performed following the standard protocol without intravenous contrast. Multiplanar CT image reconstructions of the cervical spine were also generated. COMPARISON:  CT and MRI exams of the brain from 02/03/2009 FINDINGS: CT HEAD FINDINGS Brain: Atrophy with chronic moderate small vessel ischemia. No large vascular territory infarct. No midline shift or edema. No intra-axial mass nor extra-axial fluid collections. Midline fourth ventricle and basal cisterns without effacement. Vascular: Atherosclerosis of the cavernous carotid arteries bilaterally. No hyperdense vessel sign. Skull: No skull fracture or suspicious osseous lesions. Sinuses/Orbits: Bilateral cataract extractions. Mild ethmoid sinus mucosal thickening. No acute sinus disease. No mastoid effusion. Other: None CT CERVICAL SPINE FINDINGS Alignment: Maintained cervical lordosis. Intact craniocervical relationship and atlantodental interval. No jumped or perched facets. Skull base and vertebrae: Intact skull base.  No vertebral fracture. Soft tissues and spinal canal: No prevertebral soft tissue swelling. No visible canal hematoma. Disc levels: Moderate to marked disc space narrowing at C5-6 and C6-7. Uncovertebral joint osteoarthritis noted at C5-6 and C6-7 bilaterally contributing to mild left C5-6 and mild right C6-7 neural foraminal encroachment. Multilevel degenerative facet arthropathy is visualized most prominent with subcortical cystic change at C3-4 on the left. Upper chest: Negative Other: None IMPRESSION: 1. Atrophy with chronic moderate small vessel ischemia. No acute  intracranial abnormality. 2. No acute cervical spine fracture or listhesis. 3. Degenerative disc disease C5-6 and C6-7 with uncovertebral joint osteoarthritis and uncinate spurring contributing to mild left C5-6 and right C6-7 neural foraminal encroachment. Multilevel degenerative facet arthropathy noted. Electronically Signed   By: Ashley Royalty M.D.   On: 05/13/2017 01:40   Ct Cervical Spine Wo Contrast  Result Date: 05/13/2017 CLINICAL DATA:  Hypotensive. History of stroke in 2010. Right shoulder pain. EXAM: CT HEAD WITHOUT CONTRAST CT CERVICAL SPINE WITHOUT CONTRAST TECHNIQUE: Multidetector CT imaging of the head and cervical spine was performed following the standard protocol without intravenous contrast. Multiplanar CT image reconstructions of the cervical spine were also generated. COMPARISON:  CT and MRI exams of the brain from 02/03/2009 FINDINGS: CT HEAD FINDINGS Brain: Atrophy with chronic moderate small vessel ischemia. No large vascular  territory infarct. No midline shift or edema. No intra-axial mass nor extra-axial fluid collections. Midline fourth ventricle and basal cisterns without effacement. Vascular: Atherosclerosis of the cavernous carotid arteries bilaterally. No hyperdense vessel sign. Skull: No skull fracture or suspicious osseous lesions. Sinuses/Orbits: Bilateral cataract extractions. Mild ethmoid sinus mucosal thickening. No acute sinus disease. No mastoid effusion. Other: None CT CERVICAL SPINE FINDINGS Alignment: Maintained cervical lordosis. Intact craniocervical relationship and atlantodental interval. No jumped or perched facets. Skull base and vertebrae: Intact skull base.  No vertebral fracture. Soft tissues and spinal canal: No prevertebral soft tissue swelling. No visible canal hematoma. Disc levels: Moderate to marked disc space narrowing at C5-6 and C6-7. Uncovertebral joint osteoarthritis noted at C5-6 and C6-7 bilaterally contributing to mild left C5-6 and mild right C6-7  neural foraminal encroachment. Multilevel degenerative facet arthropathy is visualized most prominent with subcortical cystic change at C3-4 on the left. Upper chest: Negative Other: None IMPRESSION: 1. Atrophy with chronic moderate small vessel ischemia. No acute intracranial abnormality. 2. No acute cervical spine fracture or listhesis. 3. Degenerative disc disease C5-6 and C6-7 with uncovertebral joint osteoarthritis and uncinate spurring contributing to mild left C5-6 and right C6-7 neural foraminal encroachment. Multilevel degenerative facet arthropathy noted. Electronically Signed   By: Ashley Royalty M.D.   On: 05/13/2017 01:40    EKG: Independently reviewed.  Assessment/Plan Principal Problem:   NSTEMI (non-ST elevated myocardial infarction) ([place]) Active Problems:   Diabetes mellitus ([place])   Chronic osteomyelitis of left foot ([facility])   HTN (hypertension)    1. NSTEMI - 1. ACS pathway 2. Heparin gtt 3. Serial trops 4. Tele monitor 5. Daily EKG 6. ASA 7. FLP in AM, will start lipitor 40. 8. Cards to eval when patient gets over to [facility]. 2. Chronic osteo of L foot - 1. No SIRS 2. Will check BCx 3. Will leave patient on the zosyn / vanc that EDP started for now and hold PO meds in case systemic infection is in play. 3. DM2 - 1. Hold metformin 2. Sensitive SSI Q4H 4. HTN - Hold BP meds due to BPs being 110 here and report of SBP 70 at home. 5. AMS - patient giving an inconsistent history 1. Delirium vs mild dementia?  DVT prophylaxis: Heparin gtt Code Status: Full Family Communication: No family in room Disposition Plan: Home after admit Consults called: Cards Admission status: Admit to inpatient   [place], [facility] Pager [phone]  If 7AM-7PM, please contact day team taking care of patient [email or web address] Password [email or web address]  05/13/2017, 2:02 AM

## 2017-05-13 NOTE — ED Notes (Signed)
Pt has purewick on and is aware a urine sample is needed.

## 2017-05-13 NOTE — ED Provider Notes (Addendum)
[facility] DEPT Provider Note   CSN: [id number] Arrival date & time: 05/12/17  2254     History   Chief Complaint Chief Complaint  Patient presents with  . Shoulder Pain  . Hypotension    HPI [name] is a 84 y.o. female.  The history is provided by the patient.  Fall  This is a new problem. The current episode started 1 to 2 hours ago. The problem occurs constantly. The problem has not changed since onset.Pertinent negatives include no chest pain, no abdominal pain, no headaches and no shortness of breath. Nothing aggravates the symptoms. Nothing relieves the symptoms. She has tried nothing for the symptoms. The treatment provided no relief.  EMS reports hypotension and that patient fell and complains of right arm pain.  She also states she fell on Thursday hitting her head and that is how her left eye got a subconjunctival hemorrhage.    Past Medical History:  Diagnosis Date  . Anemia    takes Ferrous Sulfate daily  . Arthritis   . Basal cell carcinoma of breast 1998  . Bruises easily   . Dizziness   . DM (diabetes mellitus) ([place])    takes Metformin daily  . Eczema   . Emphysema lung ([place])   . GERD (gastroesophageal reflux disease)    takes Protonix daily  . Glaucoma    Bil  . History of hiatal hernia   . HTN (hypertension)    takes Diovan daily  . Hyperlipidemia   . ITP (idiopathic thrombocytopenic purpura) 2/09  . Joint pain   . Joint swelling   . Nocturia   . Osteoporosis   . Peripheral edema   . Stroke ([place]) 02/03/09   mid brain, diplopia    Patient Active Problem List   Diagnosis Date Noted  . Diabetes mellitus ([place]) 04/10/2017  . Venous stasis dermatitis of both lower extremities 04/10/2017  . Neuropathy 04/10/2017  . Chronic osteomyelitis of left foot ([place]) 04/10/2017  . Breast cancer of upper-inner quadrant of right female breast ([place]) 10/05/2014  . Occult blood positive stool 03/24/2014  . History of ITP  04/14/2011    Past Surgical History:  Procedure Laterality Date  . BREAST LUMPECTOMY WITH RADIOACTIVE SEED LOCALIZATION Right 10/23/2014   Procedure: RIGHT BREAST LUMPECTOMY WITH RADIOACTIVE SEED LOCALIZATION;  Surgeon: Fanny Skates, MD;  Location: [facility];  Service: General;  Laterality: Right;  . CATARACT EXTRACTION Bilateral   . COLONOSCOPY    . ESOPHAGOGASTRODUODENOSCOPY    . MYOMECTOMY N/A 03/14/1995   late 90's per pt  . PARATHYROIDECTOMY  2008  . REFRACTIVE SURGERY Bilateral   . TONSILLECTOMY  as a child     OB History   None      Home Medications    Prior to Admission medications   Medication Sig Start Date End Date Taking? Authorizing Provider  amoxicillin-clavulanate (AUGMENTIN) 875-125 MG tablet Take 1 tablet by mouth 2 (two) times daily. 05/10/17   Michel Bickers, MD  aspirin 81 MG tablet Take 81 mg by mouth daily.    [provider]  Cholecalciferol (VITAMIN D3) 2000 UNITS TABS Take 2,000 Units by mouth daily.    [provider]  doxycycline (MONODOX) 100 MG capsule Take 1 capsule (100 mg total) by mouth 2 (two) times daily. 05/10/17   Michel Bickers, MD  ferrous sulfate 325 (65 FE) MG tablet Take 325 mg by mouth daily. 03/23/17   [provider]  fexofenadine (ALLEGRA) 180 MG tablet  Take 180 mg by mouth daily as needed for allergies.  03/13/14   [provider]  furosemide (LASIX) 20 MG tablet  04/24/17   [provider]  losartan (COZAAR) 100 MG tablet TAKE 1 TABLET EVERY DAY FOR BLOOD PRESSURE 03/23/17   [provider]  metFORMIN (GLUCOPHAGE) 500 MG tablet Take 500 mg by mouth 2 (two) times daily with a meal.    [provider]  Omega 3 1200 MG CAPS Take 1,200 mg by mouth daily.     [provider]  pantoprazole (PROTONIX) 40 MG tablet Take 40 mg by mouth daily.     [provider]  valsartan (DIOVAN) 320 MG tablet TAKE 1/2 TO 1 TABLET EVERY DAY 07/26/15   [provider]    Family  History Family History  Problem Relation Age of Onset  . Breast cancer Mother 13  . Colon cancer Father   . Diabetes Father        questionable  . Multiple sclerosis Daughter   . Heart disease Maternal Grandfather     Social History Social History   Tobacco Use  . Smoking status: Never Smoker  . Smokeless tobacco: Never Used  Substance Use Topics  . Alcohol use: No    Alcohol/week: 0.0 oz  . Drug use: No     Allergies   Travatan z [travoprost (bak free)]   Review of Systems Review of Systems  Constitutional: Negative for diaphoresis.  Respiratory: Negative for shortness of breath.   Cardiovascular: Positive for leg swelling. Negative for chest pain and palpitations.  Gastrointestinal: Negative for abdominal pain.  Musculoskeletal: Positive for arthralgias. Negative for back pain and neck pain.  Neurological: Negative for weakness, numbness and headaches.  All other systems reviewed and are negative.    Physical Exam Updated Vital Signs BP (!) 111/96 (BP Location: Left Arm)   Pulse (!) 102   Temp (!) 97.3 F (36.3 C) (Oral)   Resp (!) 21   SpO2 98%   Physical Exam  Constitutional: She appears well-developed and well-nourished. No distress.  HENT:  Head: Normocephalic and atraumatic. Head is without raccoon's eyes and without Battle's sign.  Right Ear: External ear normal. No hemotympanum.  Left Ear: External ear normal. No hemotympanum.  Mouth/Throat: Oropharynx is clear and moist. No oropharyngeal exudate.  Eyes: Pupils are equal, round, and reactive to light. EOM are normal.    Neck: Normal range of motion. Neck supple.  Cardiovascular: Normal rate, regular rhythm, normal heart sounds and intact distal pulses.  Pulmonary/Chest: Effort normal and breath sounds normal. No stridor. She has no wheezes. She has no rales.  Abdominal: Soft. Bowel sounds are normal. She exhibits no mass. There is no tenderness. There is no rebound and no guarding.    Musculoskeletal: Normal range of motion.  Neurological: She is alert. She displays normal reflexes.  Skin: Capillary refill takes less than 2 seconds. There is erythema. No pallor.     Psychiatric: She has a normal mood and affect.     ED Treatments / Results  Labs (all labs ordered are listed, but only abnormal results are displayed)  Results for orders placed or performed during the hospital encounter of 05/12/17  CBC with Differential/Platelet  Result Value Ref Range   WBC 9.6 4.0 - 10.5 K/uL   RBC 5.80 (H) 3.87 - 5.11 MIL/uL   Hemoglobin 16.3 (H) 12.0 - 15.0 g/dL   HCT 49.5 (H) 36.0 - 46.0 %   MCV 85.3 78.0 -  100.0 fL   MCH 28.1 26.0 - 34.0 pg   MCHC 32.9 30.0 - 36.0 g/dL   RDW 14.1 11.5 - 15.5 %   Platelets 214 150 - 400 K/uL   Neutrophils Relative % 84 %   Neutro Abs 8.1 (H) 1.7 - 7.7 K/uL   Lymphocytes Relative 9 %   Lymphs Abs 0.8 0.7 - 4.0 K/uL   Monocytes Relative 7 %   Monocytes Absolute 0.7 0.1 - 1.0 K/uL   Eosinophils Relative 0 %   Eosinophils Absolute 0.0 0.0 - 0.7 K/uL   Basophils Relative 0 %   Basophils Absolute 0.0 0.0 - 0.1 K/uL  I-Stat Chem 8, ED  Result Value Ref Range   Sodium 135 135 - 145 mmol/L   Potassium 4.3 3.5 - 5.1 mmol/L   Chloride 100 (L) 101 - 111 mmol/L   BUN 15 6 - 20 mg/dL   Creatinine, Ser 0.80 0.44 - 1.00 mg/dL   Glucose, Bld 283 (H) 65 - 99 mg/dL   Calcium, Ion 1.06 (L) 1.15 - 1.40 mmol/L   TCO2 22 22 - 32 mmol/L   Hemoglobin 20.4 (H) 12.0 - 15.0 g/dL   HCT 60.0 (H) 36.0 - 46.0 %  I-stat troponin, ED  Result Value Ref Range   Troponin i, poc 3.52 (HH) 0.00 - 0.08 ng/mL   Comment NOTIFIED PHYSICIAN    Comment 3           Dg Chest 2 View  Result Date: 05/13/2017 CLINICAL DATA:  84 y/o  F; right shoulder pain.  Hypotension. EXAM: CHEST - 2 VIEW COMPARISON:  01/09/2008 chest CT. FINDINGS: Stable enlarged cardiac silhouette given projection and technique. Calcific aortic atherosclerosis. Large hiatal hernia. Pulmonary vascular  congestion. No pleural effusion or pneumothorax. Bones are unremarkable. IMPRESSION: Large hiatal hernia. Pulmonary vascular congestion. Mild cardiomegaly. Aortic atherosclerosis. Electronically Signed   By: Kristine Garbe M.D.   On: 05/13/2017 00:13   Dg Shoulder Right  Result Date: 05/13/2017 CLINICAL DATA:  84 y/o  F; 2 weeks of right shoulder pain. EXAM: RIGHT SHOULDER - 2+ VIEW COMPARISON:  None. FINDINGS: There is no evidence of fracture or dislocation. Mild osteoarthrosis of the glenohumeral joint with small osteophytes at the inferior margin of humeral head. Soft tissues are unremarkable. IMPRESSION: 1.  No acute fracture or dislocation identified. 2. Mild osteoarthrosis of the glenohumeral joint. Electronically Signed   By: Kristine Garbe M.D.   On: 05/13/2017 00:16   Ct Head Wo Contrast  Result Date: 05/13/2017 CLINICAL DATA:  Hypotensive. History of stroke in 2010. Right shoulder pain. EXAM: CT HEAD WITHOUT CONTRAST CT CERVICAL SPINE WITHOUT CONTRAST TECHNIQUE: Multidetector CT imaging of the head and cervical spine was performed following the standard protocol without intravenous contrast. Multiplanar CT image reconstructions of the cervical spine were also generated. COMPARISON:  CT and MRI exams of the brain from 02/03/2009 FINDINGS: CT HEAD FINDINGS Brain: Atrophy with chronic moderate small vessel ischemia. No large vascular territory infarct. No midline shift or edema. No intra-axial mass nor extra-axial fluid collections. Midline fourth ventricle and basal cisterns without effacement. Vascular: Atherosclerosis of the cavernous carotid arteries bilaterally. No hyperdense vessel sign. Skull: No skull fracture or suspicious osseous lesions. Sinuses/Orbits: Bilateral cataract extractions. Mild ethmoid sinus mucosal thickening. No acute sinus disease. No mastoid effusion. Other: None CT CERVICAL SPINE FINDINGS Alignment: Maintained cervical lordosis. Intact craniocervical  relationship and atlantodental interval. No jumped or perched facets. Skull base and vertebrae: Intact skull base.  No vertebral fracture.  Soft tissues and spinal canal: No prevertebral soft tissue swelling. No visible canal hematoma. Disc levels: Moderate to marked disc space narrowing at C5-6 and C6-7. Uncovertebral joint osteoarthritis noted at C5-6 and C6-7 bilaterally contributing to mild left C5-6 and mild right C6-7 neural foraminal encroachment. Multilevel degenerative facet arthropathy is visualized most prominent with subcortical cystic change at C3-4 on the left. Upper chest: Negative Other: None IMPRESSION: 1. Atrophy with chronic moderate small vessel ischemia. No acute intracranial abnormality. 2. No acute cervical spine fracture or listhesis. 3. Degenerative disc disease C5-6 and C6-7 with uncovertebral joint osteoarthritis and uncinate spurring contributing to mild left C5-6 and right C6-7 neural foraminal encroachment. Multilevel degenerative facet arthropathy noted. Electronically Signed   By: Ashley Royalty M.D.   On: 05/13/2017 01:40   Ct Cervical Spine Wo Contrast  Result Date: 05/13/2017 CLINICAL DATA:  Hypotensive. History of stroke in 2010. Right shoulder pain. EXAM: CT HEAD WITHOUT CONTRAST CT CERVICAL SPINE WITHOUT CONTRAST TECHNIQUE: Multidetector CT imaging of the head and cervical spine was performed following the standard protocol without intravenous contrast. Multiplanar CT image reconstructions of the cervical spine were also generated. COMPARISON:  CT and MRI exams of the brain from 02/03/2009 FINDINGS: CT HEAD FINDINGS Brain: Atrophy with chronic moderate small vessel ischemia. No large vascular territory infarct. No midline shift or edema. No intra-axial mass nor extra-axial fluid collections. Midline fourth ventricle and basal cisterns without effacement. Vascular: Atherosclerosis of the cavernous carotid arteries bilaterally. No hyperdense vessel sign. Skull: No skull fracture or  suspicious osseous lesions. Sinuses/Orbits: Bilateral cataract extractions. Mild ethmoid sinus mucosal thickening. No acute sinus disease. No mastoid effusion. Other: None CT CERVICAL SPINE FINDINGS Alignment: Maintained cervical lordosis. Intact craniocervical relationship and atlantodental interval. No jumped or perched facets. Skull base and vertebrae: Intact skull base.  No vertebral fracture. Soft tissues and spinal canal: No prevertebral soft tissue swelling. No visible canal hematoma. Disc levels: Moderate to marked disc space narrowing at C5-6 and C6-7. Uncovertebral joint osteoarthritis noted at C5-6 and C6-7 bilaterally contributing to mild left C5-6 and mild right C6-7 neural foraminal encroachment. Multilevel degenerative facet arthropathy is visualized most prominent with subcortical cystic change at C3-4 on the left. Upper chest: Negative Other: None IMPRESSION: 1. Atrophy with chronic moderate small vessel ischemia. No acute intracranial abnormality. 2. No acute cervical spine fracture or listhesis. 3. Degenerative disc disease C5-6 and C6-7 with uncovertebral joint osteoarthritis and uncinate spurring contributing to mild left C5-6 and right C6-7 neural foraminal encroachment. Multilevel degenerative facet arthropathy noted. Electronically Signed   By: Ashley Royalty M.D.   On: 05/13/2017 01:40   Dg Foot Complete Left  Result Date: 04/17/2017 Please see detailed radiograph report in office note.   EKG EKG Interpretation  Date/Time:  Saturday Harnoor Kohles 06 2019 23:34:12 EDT Ventricular Rate:  98 PR Interval:    QRS Duration: 85 QT Interval:  361 QTC Calculation: 461 R Axis:   -18 Text Interpretation:  Sinus rhythm Borderline left axis deviation anterior infarct st t changes inferiorly Reconfirmed by Randal Buba, Sharetta Ricchio ([id number]) on 05/13/2017 1:08:13 AM   Radiology Dg Chest 2 View  Result Date: 05/13/2017 CLINICAL DATA:  84 y/o  F; right shoulder pain.  Hypotension. EXAM: CHEST - 2 VIEW  COMPARISON:  01/09/2008 chest CT. FINDINGS: Stable enlarged cardiac silhouette given projection and technique. Calcific aortic atherosclerosis. Large hiatal hernia. Pulmonary vascular congestion. No pleural effusion or pneumothorax. Bones are unremarkable. IMPRESSION: Large hiatal hernia. Pulmonary vascular congestion. Mild cardiomegaly. Aortic atherosclerosis. Electronically  Signed   By: Kristine Garbe M.D.   On: 05/13/2017 00:13   Dg Shoulder Right  Result Date: 05/13/2017 CLINICAL DATA:  84 y/o  F; 2 weeks of right shoulder pain. EXAM: RIGHT SHOULDER - 2+ VIEW COMPARISON:  None. FINDINGS: There is no evidence of fracture or dislocation. Mild osteoarthrosis of the glenohumeral joint with small osteophytes at the inferior margin of humeral head. Soft tissues are unremarkable. IMPRESSION: 1.  No acute fracture or dislocation identified. 2. Mild osteoarthrosis of the glenohumeral joint. Electronically Signed   By: Kristine Garbe M.D.   On: 05/13/2017 00:16    Procedures Procedures (including critical care time)  Medications Ordered in ED Medications  vancomycin (VANCOCIN) IVPB 1000 mg/200 mL premix (1,000 mg Intravenous New Bag/Given 05/13/17 0046)  piperacillin-tazobactam (ZOSYN) IVPB 3.375 g (3.375 g Intravenous New Bag/Given 05/13/17 0039)  sodium chloride 0.9 % bolus 500 mL (500 mLs Intravenous New Bag/Given 05/13/17 0038)     MDM Reviewed: previous chart, nursing note and vitals Reviewed previous: ECG Interpretation: labs, ECG and x-ray (3.52 troponin no PNA ON CXR) Total time providing critical care: 75-105 minutes (heparin drip  ). This excludes time spent performing separately reportable procedures and services. Consults: cardiology and admitting MD  CRITICAL CARE Performed by: Carlisle Beers Total critical care time: 75 minutes Critical care time was exclusive of separately billable procedures and treating other patients. Critical care was necessary to treat  or prevent imminent or life-threatening deterioration. Critical care was time spent personally by me on the following activities: development of treatment plan with patient and/or surrogate as well as nursing, discussions with consultants, evaluation of patient's response to treatment, examination of patient, obtaining history from patient or surrogate, ordering and performing treatments and interventions, ordering and review of laboratory studies, ordering and review of radiographic studies, pulse oximetry and re-evaluation of patient's condition.   Final Clinical Impressions(s) / ED Diagnoses   Case d/w cardiology Dr. Harrell Gave.  ASA and heparin and admit to hospitalist    Hassel Uphoff, MD 05/13/17 0139    Randal Buba, Iyania Denne, MD 05/13/17 0200

## 2017-05-13 NOTE — Progress Notes (Signed)
Pharmacy Antibiotic Note  [name] is a 84 y.o. female admitted on 05/12/2017 with osteomyelitis of left foot .  Pharmacy has been consulted for zosyn and vancomycin dosing. Of note- ID following as outpt (last note 4/4) and plan was to continue her PTA oral antibiotics for 2 more weeks) -WBC= 11.9, afeb  Plan: Zosyn 3.375g IV q8h (4 hour infusion). Change vancomycin to 750mg  IV q12h Will follow renal function, cultures and clinical progress    Temp (24hrs), Avg:98 F (36.7 C), Min:97.3 F (36.3 C), Max:98.6 F (37 C)  Recent Labs  Lab 05/12/17 2326 05/13/17 0058 05/13/17 0514 05/13/17 0718 05/13/17 0944  WBC 9.6  --   --   --  11.9*  CREATININE  --  0.80  --   --   --   LATICACIDVEN  --   --  1.6 1.4  --     Estimated Creatinine Clearance: 51.4 mL/min (by C-G formula based on SCr of 0.8 mg/dL).    Allergies  Allergen Reactions  . Travatan Z [Travoprost (Bak Free)] Other (See Comments)    dizziness    Antimicrobials this admission: 4/7 zosyn >>  4/7 vancomycin >>   Dose adjustments this admission:   Microbiology results:  BCx:   UCx:    Sputum:    MRSA PCR:   Thank you for allowing pharmacy to be a part of this patient's care.  Dareen Piano 05/13/2017 12:34 PM

## 2017-05-13 NOTE — Progress Notes (Addendum)
BP running on the low side, 88 systolic.  Will try 500cc bolus to see if increasing pre-load helps given that it seems to be inferior wall based on EKG.  After starting 500cc bolus, BP now back up to 102/66.  Patient says she "feels better than when she came in" but is still having arm pain.  Will send cards fellow an update.  Obviously I dont feel that I can get NTG or narcotics on board given the soft blood pressures at this time.

## 2017-05-13 NOTE — Progress Notes (Signed)
ANTICOAGULATION CONSULT NOTE - Initial Consult  Pharmacy Consult for IV heparin Indication: chest pain/ACS  Allergies  Allergen Reactions  . Travatan Z [Travoprost (Bak Free)] Other (See Comments)    dizziness    Patient Measurements:   Heparin Dosing Weight: 61 kg  Vital Signs: Temp: 97.3 F (36.3 C) (04/06 2315) Temp Source: Oral (04/06 2315) BP: 111/96 (04/06 2315) Pulse Rate: 102 (04/06 2315)  Labs: Recent Labs    05/12/17 2326 05/13/17 0058  HGB 16.3* 20.4*  HCT 49.5* 60.0*  PLT 214  --   CREATININE  --  0.80    Estimated Creatinine Clearance: 51.4 mL/min (by C-G formula based on SCr of 0.8 mg/dL).   Medical History: Past Medical History:  Diagnosis Date  . Anemia    takes Ferrous Sulfate daily  . Arthritis   . Basal cell carcinoma of breast 1998  . Bruises easily   . Dizziness   . DM (diabetes mellitus) ([place])    takes Metformin daily  . Eczema   . Emphysema lung ([place])   . GERD (gastroesophageal reflux disease)    takes Protonix daily  . Glaucoma    Bil  . History of hiatal hernia   . HTN (hypertension)    takes Diovan daily  . Hyperlipidemia   . ITP (idiopathic thrombocytopenic purpura) 2/09  . Joint pain   . Joint swelling   . Nocturia   . Osteoporosis   . Peripheral edema   . Stroke ([place]) 02/03/09   mid brain, diplopia    Medications:  Scheduled:  . aspirin  324 mg Oral Once  . heparin  4,000 Units Intravenous Once   Infusions:  . heparin    . sodium chloride 500 mL (05/13/17 0038)  . vancomycin 1,000 mg (05/13/17 0046)    Assessment: 63 yoF c/o shoulder pain and hypotension found to have troponin = 3.52.  IV heparin for ACS.  Goal of Therapy:  Heparin level 0.3-0.7 units/ml Monitor platelets by anticoagulation protocol: Yes   Plan:  Baseline aPtt and PT/INR STAT Heparin 4000 unit bolus x1 now Start drip at 750 units/hr Daily CBC/HL Check 1st HL in 8 hours  Dorrene German 05/13/2017,1:29 AM

## 2017-05-13 NOTE — Progress Notes (Signed)
Pharmacy Antibiotic Note  [name] is a 84 y.o. female admitted on 05/12/2017 with osteomyelitis of left foot .  Pharmacy has been consulted for zosyn and vancomycin dosing.  Plan: Zosyn 3.375g IV q8h (4 hour infusion). Vancomycin 1 Gm IV q36h for est AUC = 520  Goal AUC = 400-500 Daily Scr F/u cultures/levels    Temp (24hrs), Avg:97.3 F (36.3 C), Min:97.3 F (36.3 C), Max:97.3 F (36.3 C)  Recent Labs  Lab 05/12/17 2326 05/13/17 0058  WBC 9.6  --   CREATININE  --  0.80    Estimated Creatinine Clearance: 51.4 mL/min (by C-G formula based on SCr of 0.8 mg/dL).    Allergies  Allergen Reactions  . Travatan Z [Travoprost (Bak Free)] Other (See Comments)    dizziness    Antimicrobials this admission: 4/7 zosyn >>  4/7 vancomycin >>   Dose adjustments this admission:   Microbiology results:  BCx:   UCx:    Sputum:    MRSA PCR:   Thank you for allowing pharmacy to be a part of this patient's care.  Dorrene German 05/13/2017 2:31 AM

## 2017-05-13 NOTE — ED Notes (Signed)
Labs  Shown to Dr Randal Buba

## 2017-05-13 NOTE — Progress Notes (Signed)
PROGRESS NOTE    Chelsea [name]  BHA:[id number] DOB: 20-Jun-1928 DOA: 05/12/2017 PCP: Jani Gravel, MD   Brief Narrative:  84 y.o. WF PMHx CVA, DM Type 2 , HTN, Basal cell carcinoma of breast 1998, Diabetes type 2 controlled with complication, COPD (emphysema), HLD, ITP, Hiatal hernia,Chronic Osteomyelitis of LEFT foot currently being treated with PO ABx.  Patient presents to the ED with c/o R shoulder pain.  She apparently called EMS after being unable to get out of bed.   To me she denies fall (different than EDP history).  To me she states that R shoulder pain has been ongoing for about 1 week.  L shoulder also started to hurt around Thurs.  She reports trouble getting out of chairs and out of bed recently.   (I suspect patient may have underlying dementia of some degree vs delirium.)   Per EMS initial SBP was 70.   ED Course: Trop is 3.5.  EKG shows inferior ST depressions.   Patient given zosyn / vanc for her foot ulcer by EDP.  Cultures ordered.  Cards consulted and hospitalist asked to admit.    Subjective: 4/7 A/O x4, negative CP.  Positive right neck/shoulder pain unchanged.  Negative SOB, negative diaphoresis   Assessment & Plan:   Principal Problem:   NSTEMI (non-ST elevated myocardial infarction) ([place]) Active Problems:   Diabetes mellitus (HCC)   Chronic osteomyelitis of left foot (HCC)   HTN (hypertension)  NSTEMI  -Elevated troponin level and an EKG consistent with a prior anterior wall myocardial infarction. - Currently pain-free -Echocardiogram pending per cardiology -Cardiac catheterization?  Will await recommendations from cardiology  - Heparin drip  Chronic osteomyelitis LEFT foot -Blood culture pending -Continue current antibiotics prophylactically  Diabetes type 2 controlled with complication - 9/73 Hemoglobin A1c= 6.8 -Lipitor 40 mg daily -lipid panel pending -Sensitive SSI  Essential hypertension  Altered mental status - Resolved     DVT  prophylaxis: Heparin Drip Code Status: Full Family Communication: None Disposition Plan: Per cardiology   Consultants:  Cardiology  Procedures/Significant Events:     I have personally reviewed and interpreted all radiology studies and my findings are as above.  VENTILATOR SETTINGS:    Cultures   Antimicrobials: Anti-infectives (From admission, onward)   Start     Stop   05/13/17 1600  vancomycin (VANCOCIN) IVPB 1000 mg/200 mL premix  Status:  Discontinued     05/13/17 1237   05/13/17 1330  vancomycin (VANCOCIN) IVPB 750 mg/150 ml premix         05/13/17 0600  piperacillin-tazobactam (ZOSYN) IVPB 3.375 g         05/12/17 2330  vancomycin (VANCOCIN) IVPB 1000 mg/200 mL premix     05/13/17 0135   05/12/17 2330  piperacillin-tazobactam (ZOSYN) IVPB 3.375 g     05/13/17 0134       Devices    LINES / TUBES:      Continuous Infusions: . heparin 750 Units/hr (05/13/17 0156)  . piperacillin-tazobactam (ZOSYN)  IV Stopped (05/13/17 0719)  . vancomycin       Objective: Vitals:   05/13/17 0329 05/13/17 0453 05/13/17 0600 05/13/17 0747  BP: (!) 88/55 102/66 (!) 96/59 93/60  Pulse: 96 96 91 94  Resp: (!) 25 15 (!) 26 19  Temp:      TempSrc:      SpO2: 96% 96% 97% 96%    Intake/Output Summary (Last 24 hours) at 05/13/2017 0858 Last data filed at 05/13/2017 0135 Gross per  24 hour  Intake 750 ml  Output -  Net 750 ml   There were no vitals filed for this visit.  Examination:  General: A/O x4, No acute respiratory distress Neck:  Negative scars, masses, torticollis, lymphadenopathy, JVD Lungs: Clear to auscultation bilaterally without wheezes or crackles Cardiovascular: Regular rate and rhythm without murmur gallop or rub normal S1 and S2 Abdomen: negative abdominal pain, nondistended, positive soft, bowel sounds, no rebound, no ascites, no appreciable mass Extremities: positive LLE erythema, ulcer head of the left fifth metatarsal, LEFT ankle floppy does  not appear joint functional, nonpainful to movement.   Skin:  Psychiatric:  Negative depression, negative anxiety, negative fatigue, negative mania  Central nervous system:  Cranial nerves II through XII intact, tongue/uvula midline, all extremities muscle strength 5/5, sensation intact throughout,  negative dysarthria, negative expressive aphasia, negative receptive aphasia.  .     Data Reviewed: Care during the described time interval was provided by me .  I have reviewed this patient's available data, including medical history, events of note, physical examination, and all test results as part of my evaluation.   CBC: Recent Labs  Lab 05/12/17 2326 05/13/17 0058  WBC 9.6  --   NEUTROABS 8.1*  --   HGB 16.3* 20.4*  HCT 49.5* 60.0*  MCV 85.3  --   PLT 214  --    Basic Metabolic Panel: Recent Labs  Lab 05/13/17 0058  NA 135  K 4.3  CL 100*  GLUCOSE 283*  BUN 15  CREATININE 0.80   GFR: Estimated Creatinine Clearance: 51.4 mL/min (by C-G formula based on SCr of 0.8 mg/dL). Liver Function Tests: No results for input(s): AST, ALT, ALKPHOS, BILITOT, PROT, ALBUMIN in the last 168 hours. No results for input(s): LIPASE, AMYLASE in the last 168 hours. No results for input(s): AMMONIA in the last 168 hours. Coagulation Profile: No results for input(s): INR, PROTIME in the last 168 hours. Cardiac Enzymes: Recent Labs  Lab 05/13/17 0329 05/13/17 0718  TROPONINI 5.15* 5.87*   BNP (last 3 results) No results for input(s): PROBNP in the last 8760 hours. HbA1C: No results for input(s): HGBA1C in the last 72 hours. CBG: Recent Labs  Lab 05/13/17 0327 05/13/17 0716  GLUCAP 255* 178*   Lipid Profile: Recent Labs    05/13/17 0329  CHOL 113  HDL 52  LDLCALC 55  TRIG 28  CHOLHDL 2.2   Thyroid Function Tests: No results for input(s): TSH, T4TOTAL, FREET4, T3FREE, THYROIDAB in the last 72 hours. Anemia Panel: No results for input(s): VITAMINB12, FOLATE, FERRITIN,  TIBC, IRON, RETICCTPCT in the last 72 hours. Urine analysis: No results found for: COLORURINE, APPEARANCEUR, LABSPEC, PHURINE, GLUCOSEU, HGBUR, BILIRUBINUR, KETONESUR, PROTEINUR, UROBILINOGEN, NITRITE, LEUKOCYTESUR Sepsis Labs: @LABRCNTIP (procalcitonin:4,lacticidven:4)  )No results found for this or any previous visit (from the past 240 hour(s)).       Radiology Studies: Dg Chest 2 View  Result Date: 05/13/2017 CLINICAL DATA:  84 y/o  F; right shoulder pain.  Hypotension. EXAM: CHEST - 2 VIEW COMPARISON:  01/09/2008 chest CT. FINDINGS: Stable enlarged cardiac silhouette given projection and technique. Calcific aortic atherosclerosis. Large hiatal hernia. Pulmonary vascular congestion. No pleural effusion or pneumothorax. Bones are unremarkable. IMPRESSION: Large hiatal hernia. Pulmonary vascular congestion. Mild cardiomegaly. Aortic atherosclerosis. Electronically Signed   By: Kristine Garbe M.D.   On: 05/13/2017 00:13   Dg Shoulder Right  Result Date: 05/13/2017 CLINICAL DATA:  84 y/o  F; 2 weeks of right shoulder pain. EXAM: RIGHT SHOULDER -  2+ VIEW COMPARISON:  None. FINDINGS: There is no evidence of fracture or dislocation. Mild osteoarthrosis of the glenohumeral joint with small osteophytes at the inferior margin of humeral head. Soft tissues are unremarkable. IMPRESSION: 1.  No acute fracture or dislocation identified. 2. Mild osteoarthrosis of the glenohumeral joint. Electronically Signed   By: Kristine Garbe M.D.   On: 05/13/2017 00:16   Ct Head Wo Contrast  Result Date: 05/13/2017 CLINICAL DATA:  Hypotensive. History of stroke in 2010. Right shoulder pain. EXAM: CT HEAD WITHOUT CONTRAST CT CERVICAL SPINE WITHOUT CONTRAST TECHNIQUE: Multidetector CT imaging of the head and cervical spine was performed following the standard protocol without intravenous contrast. Multiplanar CT image reconstructions of the cervical spine were also generated. COMPARISON:  CT and MRI  exams of the brain from 02/03/2009 FINDINGS: CT HEAD FINDINGS Brain: Atrophy with chronic moderate small vessel ischemia. No large vascular territory infarct. No midline shift or edema. No intra-axial mass nor extra-axial fluid collections. Midline fourth ventricle and basal cisterns without effacement. Vascular: Atherosclerosis of the cavernous carotid arteries bilaterally. No hyperdense vessel sign. Skull: No skull fracture or suspicious osseous lesions. Sinuses/Orbits: Bilateral cataract extractions. Mild ethmoid sinus mucosal thickening. No acute sinus disease. No mastoid effusion. Other: None CT CERVICAL SPINE FINDINGS Alignment: Maintained cervical lordosis. Intact craniocervical relationship and atlantodental interval. No jumped or perched facets. Skull base and vertebrae: Intact skull base.  No vertebral fracture. Soft tissues and spinal canal: No prevertebral soft tissue swelling. No visible canal hematoma. Disc levels: Moderate to marked disc space narrowing at C5-6 and C6-7. Uncovertebral joint osteoarthritis noted at C5-6 and C6-7 bilaterally contributing to mild left C5-6 and mild right C6-7 neural foraminal encroachment. Multilevel degenerative facet arthropathy is visualized most prominent with subcortical cystic change at C3-4 on the left. Upper chest: Negative Other: None IMPRESSION: 1. Atrophy with chronic moderate small vessel ischemia. No acute intracranial abnormality. 2. No acute cervical spine fracture or listhesis. 3. Degenerative disc disease C5-6 and C6-7 with uncovertebral joint osteoarthritis and uncinate spurring contributing to mild left C5-6 and right C6-7 neural foraminal encroachment. Multilevel degenerative facet arthropathy noted. Electronically Signed   By: Ashley Royalty M.D.   On: 05/13/2017 01:40   Ct Cervical Spine Wo Contrast  Result Date: 05/13/2017 CLINICAL DATA:  Hypotensive. History of stroke in 2010. Right shoulder pain. EXAM: CT HEAD WITHOUT CONTRAST CT CERVICAL SPINE  WITHOUT CONTRAST TECHNIQUE: Multidetector CT imaging of the head and cervical spine was performed following the standard protocol without intravenous contrast. Multiplanar CT image reconstructions of the cervical spine were also generated. COMPARISON:  CT and MRI exams of the brain from 02/03/2009 FINDINGS: CT HEAD FINDINGS Brain: Atrophy with chronic moderate small vessel ischemia. No large vascular territory infarct. No midline shift or edema. No intra-axial mass nor extra-axial fluid collections. Midline fourth ventricle and basal cisterns without effacement. Vascular: Atherosclerosis of the cavernous carotid arteries bilaterally. No hyperdense vessel sign. Skull: No skull fracture or suspicious osseous lesions. Sinuses/Orbits: Bilateral cataract extractions. Mild ethmoid sinus mucosal thickening. No acute sinus disease. No mastoid effusion. Other: None CT CERVICAL SPINE FINDINGS Alignment: Maintained cervical lordosis. Intact craniocervical relationship and atlantodental interval. No jumped or perched facets. Skull base and vertebrae: Intact skull base.  No vertebral fracture. Soft tissues and spinal canal: No prevertebral soft tissue swelling. No visible canal hematoma. Disc levels: Moderate to marked disc space narrowing at C5-6 and C6-7. Uncovertebral joint osteoarthritis noted at C5-6 and C6-7 bilaterally contributing to mild left C5-6 and mild right  C6-7 neural foraminal encroachment. Multilevel degenerative facet arthropathy is visualized most prominent with subcortical cystic change at C3-4 on the left. Upper chest: Negative Other: None IMPRESSION: 1. Atrophy with chronic moderate small vessel ischemia. No acute intracranial abnormality. 2. No acute cervical spine fracture or listhesis. 3. Degenerative disc disease C5-6 and C6-7 with uncovertebral joint osteoarthritis and uncinate spurring contributing to mild left C5-6 and right C6-7 neural foraminal encroachment. Multilevel degenerative facet  arthropathy noted. Electronically Signed   By: Ashley Royalty M.D.   On: 05/13/2017 01:40        Scheduled Meds: . aspirin  81 mg Oral QPC breakfast  . atorvastatin  40 mg Oral q1800  . cholecalciferol  2,000 Units Oral Daily  . insulin aspart  0-9 Units Subcutaneous Q4H  . omega-3 acid ethyl esters  1,000 mg Oral Daily  . pantoprazole  40 mg Oral QPC breakfast   Continuous Infusions: . heparin 750 Units/hr (05/13/17 0156)  . piperacillin-tazobactam (ZOSYN)  IV Stopped (05/13/17 0719)  . vancomycin       LOS: 0 days    Time spent: 40 minutes    Jeramyah Goodpasture, Geraldo Docker, MD [facility] Pager [phone]   If 7PM-7AM, please contact night-coverage [email or web address] Password [facility] 05/13/2017, 8:58 AM

## 2017-05-13 NOTE — Progress Notes (Signed)
[facility] for IV heparin Indication: chest pain/ACS  Allergies  Allergen Reactions  . Travatan Z [Travoprost (Bak Free)] Other (See Comments)    dizziness    Patient Measurements:   Heparin Dosing Weight: 61 kg  Vital Signs: Temp: 98.6 F (37 C) (04/07 0906) Temp Source: Oral (04/07 0906) BP: 119/90 (04/07 1221) Pulse Rate: 97 (04/07 1221)  Labs: Recent Labs    05/12/17 2326 05/13/17 0058 05/13/17 0329 05/13/17 0718 05/13/17 0944 05/13/17 1335  HGB 16.3* 20.4*  --   --  12.1  --   HCT 49.5* 60.0*  --   --  37.5  --   PLT 214  --   --   --  234  --   HEPARINUNFRC  --   --   --   --   --  <0.10*  CREATININE  --  0.80  --   --   --   --   TROPONINI  --   --  5.15* 5.87*  --   --     Estimated Creatinine Clearance: 51.4 mL/min (by C-G formula based on SCr of 0.8 mg/dL).   Medical History: Past Medical History:  Diagnosis Date  . Anemia    takes Ferrous Sulfate daily  . Arthritis   . Basal cell carcinoma of breast 1998  . Bruises easily   . Dizziness   . DM (diabetes mellitus) ([place])    takes Metformin daily  . Eczema   . Emphysema lung ([place])   . GERD (gastroesophageal reflux disease)    takes Protonix daily  . Glaucoma    Bil  . History of hiatal hernia   . HTN (hypertension)    takes Diovan daily  . Hyperlipidemia   . ITP (idiopathic thrombocytopenic purpura) 2/09  . Joint pain   . Joint swelling   . Nocturia   . Osteoporosis   . Peripheral edema   . Stroke ([place]) 02/03/09   mid brain, diplopia    Medications:  Scheduled:  . aspirin  81 mg Oral QPC breakfast  . atorvastatin  40 mg Oral q1800  . cholecalciferol  2,000 Units Oral Daily  . insulin aspart  0-9 Units Subcutaneous Q4H  . omega-3 acid ethyl esters  1,000 mg Oral Daily  . pantoprazole  40 mg Oral QPC breakfast   Infusions:  . heparin 750 Units/hr (05/13/17 0156)  . piperacillin-tazobactam (ZOSYN)  IV Stopped (05/13/17 0719)  . vancomycin 750  mg (05/13/17 1420)    Assessment: 31 yoF c/o shoulder pain and hypotension found to have NSTMEI and pharmacy dosing heparin -Initial heparin level  < 0.1  Goal of Therapy:  Heparin level 0.3-0.7 units/ml Monitor platelets by anticoagulation protocol: Yes   Plan:  -heparin 2000 units bolus x1 and increase to 950 units/hr -Heparin level in 8 hours and daily wth CBC daily  Hildred Laser, PharmD Clinical Pharmacist Clinical phone from 8:30-4:00 is [phone] After 4pm, please call Main [facility] [phone]) for assistance. 05/13/2017 2:56 PM

## 2017-05-13 NOTE — ED Notes (Signed)
ED TO INPATIENT HANDOFF REPORT  Name/Age/Gender Chelsea [name] 84 y.o. female  Code Status    Code Status Orders  (From admission, onward)        Start     Ordered   05/13/17 0156  Full code  Continuous     05/13/17 0200    Code Status History    This patient has a current code status but no historical code status.      Home/SNF/Other Home  Chief Complaint fall/couldn't stand up  Level of Care/Admitting Diagnosis ED Disposition    ED Disposition Condition [place]: [place] [100100]  Level of Care: Stepdown [14]  Diagnosis: NSTEMI (non-ST elevated myocardial infarction) [facility]) [[id number]]  Admitting Physician: Doreatha Massed  Attending Physician: Etta Quill [phone]  Estimated length of stay: past midnight tomorrow  Certification:: I certify this patient will need inpatient services for at least 2 midnights  PT Class (Do Not Modify): Inpatient [101]  PT Acc Code (Do Not Modify): Private [1]       Medical History Past Medical History:  Diagnosis Date  . Anemia    takes Ferrous Sulfate daily  . Arthritis   . Basal cell carcinoma of breast 1998  . Bruises easily   . Dizziness   . DM (diabetes mellitus) ([place])    takes Metformin daily  . Eczema   . Emphysema lung ([place])   . GERD (gastroesophageal reflux disease)    takes Protonix daily  . Glaucoma    Bil  . History of hiatal hernia   . HTN (hypertension)    takes Diovan daily  . Hyperlipidemia   . ITP (idiopathic thrombocytopenic purpura) 2/09  . Joint pain   . Joint swelling   . Nocturia   . Osteoporosis   . Peripheral edema   . Stroke ([place]) 02/03/09   mid brain, diplopia    Allergies Allergies  Allergen Reactions  . Travatan Z [Travoprost (Bak Free)] Other (See Comments)    dizziness    IV Location/Drains/Wounds Patient Lines/Drains/Airways Status   Active Line/Drains/Airways    Name:   Placement date:   Placement time:   Site:   Days:    Peripheral IV 05/13/17 Left Hand   05/13/17    0030    Hand   less than 1   Peripheral IV 05/13/17 Right Wrist   05/13/17    0043    Wrist   less than 1   Incision (Closed) 10/23/14 Breast Right   10/23/14    0846     933          Labs/Imaging Results for orders placed or performed during the hospital encounter of 05/12/17 (from the past 48 hour(s))  CBC with Differential/Platelet     Status: Abnormal   Collection Time: 05/12/17 11:26 PM  Result Value Ref Range   WBC 9.6 4.0 - 10.5 K/uL   RBC 5.80 (H) 3.87 - 5.11 MIL/uL   Hemoglobin 16.3 (H) 12.0 - 15.0 g/dL   HCT 49.5 (H) 36.0 - 46.0 %   MCV 85.3 78.0 - 100.0 fL   MCH 28.1 26.0 - 34.0 pg   MCHC 32.9 30.0 - 36.0 g/dL   RDW 14.1 11.5 - 15.5 %   Platelets 214 150 - 400 K/uL   Neutrophils Relative % 84 %   Neutro Abs 8.1 (H) 1.7 - 7.7 K/uL   Lymphocytes Relative 9 %   Lymphs Abs 0.8 0.7 -  4.0 K/uL   Monocytes Relative 7 %   Monocytes Absolute 0.7 0.1 - 1.0 K/uL   Eosinophils Relative 0 %   Eosinophils Absolute 0.0 0.0 - 0.7 K/uL   Basophils Relative 0 %   Basophils Absolute 0.0 0.0 - 0.1 K/uL    Comment: Performed at [facility], [place] [address] [place], [place] [address]  I-stat troponin, ED     Status: Abnormal   Collection Time: 05/13/17 12:56 AM  Result Value Ref Range   Troponin i, poc 3.52 (HH) 0.00 - 0.08 ng/mL   Comment NOTIFIED PHYSICIAN    Comment 3            Comment: Due to the release kinetics of cTnI, a negative result within the first hours of the onset of symptoms does not rule out myocardial infarction with certainty. If myocardial infarction is still suspected, repeat the test at appropriate intervals.   I-Stat Chem 8, ED     Status: Abnormal   Collection Time: 05/13/17 12:58 AM  Result Value Ref Range   Sodium 135 135 - 145 mmol/L   Potassium 4.3 3.5 - 5.1 mmol/L   Chloride 100 (L) 101 - 111 mmol/L   BUN 15 6 - 20 mg/dL   Creatinine, Ser 0.80 0.44 - 1.00 mg/dL   Glucose, Bld  283 (H) 65 - 99 mg/dL   Calcium, Ion 1.06 (L) 1.15 - 1.40 mmol/L   TCO2 22 22 - 32 mmol/L   Hemoglobin 20.4 (H) 12.0 - 15.0 g/dL   HCT 60.0 (H) 36.0 - 46.0 %  CBG monitoring, ED     Status: Abnormal   Collection Time: 05/13/17  3:27 AM  Result Value Ref Range   Glucose-Capillary 255 (H) 65 - 99 mg/dL  Troponin I     Status: Abnormal   Collection Time: 05/13/17  3:29 AM  Result Value Ref Range   Troponin I 5.15 (HH) <0.03 ng/mL    Comment: CRITICAL RESULT CALLED TO, READ BACK BY AND VERIFIED WITH: HEDGECOCK,S AT 0427 ON 05/13/2017 BY MOSLEY,J Performed at [facility], [place] [address] [place], [place] [address]   Lipid panel     Status: None   Collection Time: 05/13/17  3:29 AM  Result Value Ref Range   Cholesterol 113 0 - 200 mg/dL   Triglycerides 28 <150 mg/dL   HDL 52 >40 mg/dL   Total CHOL/HDL Ratio 2.2 RATIO   VLDL 6 0 - 40 mg/dL   LDL Cholesterol 55 0 - 99 mg/dL    Comment:        Total Cholesterol/HDL:CHD Risk Coronary Heart Disease Risk Table                     Men   Women  1/2 Average Risk   3.4   3.3  Average Risk       5.0   4.4  2 X Average Risk   9.6   7.1  3 X Average Risk  23.4   11.0        Use the calculated Patient Ratio above and the CHD Risk Table to determine the patient's CHD Risk.        ATP III CLASSIFICATION (LDL):  <100     mg/dL   Optimal  100-129  mg/dL   Near or Above                    Optimal  130-159  mg/dL  Borderline  160-189  mg/dL   High  >190     mg/dL   Very High Performed at [facility] [address] [place], [place] [address]   Lactic acid, plasma     Status: None   Collection Time: 05/13/17  5:14 AM  Result Value Ref Range   Lactic Acid, Venous 1.6 0.5 - 1.9 mmol/L    Comment: Performed at [facility], [place] [address], [place], [place] [address]  CBG monitoring, ED     Status: Abnormal   Collection Time: 05/13/17  7:16 AM  Result Value Ref Range    Glucose-Capillary 178 (H) 65 - 99 mg/dL   Dg Chest 2 View  Result Date: 05/13/2017 CLINICAL DATA:  84 y/o  F; right shoulder pain.  Hypotension. EXAM: CHEST - 2 VIEW COMPARISON:  01/09/2008 chest CT. FINDINGS: Stable enlarged cardiac silhouette given projection and technique. Calcific aortic atherosclerosis. Large hiatal hernia. Pulmonary vascular congestion. No pleural effusion or pneumothorax. Bones are unremarkable. IMPRESSION: Large hiatal hernia. Pulmonary vascular congestion. Mild cardiomegaly. Aortic atherosclerosis. Electronically Signed   By: Kristine Garbe M.D.   On: 05/13/2017 00:13   Dg Shoulder Right  Result Date: 05/13/2017 CLINICAL DATA:  84 y/o  F; 2 weeks of right shoulder pain. EXAM: RIGHT SHOULDER - 2+ VIEW COMPARISON:  None. FINDINGS: There is no evidence of fracture or dislocation. Mild osteoarthrosis of the glenohumeral joint with small osteophytes at the inferior margin of humeral head. Soft tissues are unremarkable. IMPRESSION: 1.  No acute fracture or dislocation identified. 2. Mild osteoarthrosis of the glenohumeral joint. Electronically Signed   By: Kristine Garbe M.D.   On: 05/13/2017 00:16   Ct Head Wo Contrast  Result Date: 05/13/2017 CLINICAL DATA:  Hypotensive. History of stroke in 2010. Right shoulder pain. EXAM: CT HEAD WITHOUT CONTRAST CT CERVICAL SPINE WITHOUT CONTRAST TECHNIQUE: Multidetector CT imaging of the head and cervical spine was performed following the standard protocol without intravenous contrast. Multiplanar CT image reconstructions of the cervical spine were also generated. COMPARISON:  CT and MRI exams of the brain from 02/03/2009 FINDINGS: CT HEAD FINDINGS Brain: Atrophy with chronic moderate small vessel ischemia. No large vascular territory infarct. No midline shift or edema. No intra-axial mass nor extra-axial fluid collections. Midline fourth ventricle and basal cisterns without effacement. Vascular: Atherosclerosis of the cavernous  carotid arteries bilaterally. No hyperdense vessel sign. Skull: No skull fracture or suspicious osseous lesions. Sinuses/Orbits: Bilateral cataract extractions. Mild ethmoid sinus mucosal thickening. No acute sinus disease. No mastoid effusion. Other: None CT CERVICAL SPINE FINDINGS Alignment: Maintained cervical lordosis. Intact craniocervical relationship and atlantodental interval. No jumped or perched facets. Skull base and vertebrae: Intact skull base.  No vertebral fracture. Soft tissues and spinal canal: No prevertebral soft tissue swelling. No visible canal hematoma. Disc levels: Moderate to marked disc space narrowing at C5-6 and C6-7. Uncovertebral joint osteoarthritis noted at C5-6 and C6-7 bilaterally contributing to mild left C5-6 and mild right C6-7 neural foraminal encroachment. Multilevel degenerative facet arthropathy is visualized most prominent with subcortical cystic change at C3-4 on the left. Upper chest: Negative Other: None IMPRESSION: 1. Atrophy with chronic moderate small vessel ischemia. No acute intracranial abnormality. 2. No acute cervical spine fracture or listhesis. 3. Degenerative disc disease C5-6 and C6-7 with uncovertebral joint osteoarthritis and uncinate spurring contributing to mild left C5-6 and right C6-7 neural foraminal encroachment. Multilevel degenerative facet arthropathy noted. Electronically Signed   By: Ashley Royalty M.D.   On:  05/13/2017 01:40   Ct Cervical Spine Wo Contrast  Result Date: 05/13/2017 CLINICAL DATA:  Hypotensive. History of stroke in 2010. Right shoulder pain. EXAM: CT HEAD WITHOUT CONTRAST CT CERVICAL SPINE WITHOUT CONTRAST TECHNIQUE: Multidetector CT imaging of the head and cervical spine was performed following the standard protocol without intravenous contrast. Multiplanar CT image reconstructions of the cervical spine were also generated. COMPARISON:  CT and MRI exams of the brain from 02/03/2009 FINDINGS: CT HEAD FINDINGS Brain: Atrophy with  chronic moderate small vessel ischemia. No large vascular territory infarct. No midline shift or edema. No intra-axial mass nor extra-axial fluid collections. Midline fourth ventricle and basal cisterns without effacement. Vascular: Atherosclerosis of the cavernous carotid arteries bilaterally. No hyperdense vessel sign. Skull: No skull fracture or suspicious osseous lesions. Sinuses/Orbits: Bilateral cataract extractions. Mild ethmoid sinus mucosal thickening. No acute sinus disease. No mastoid effusion. Other: None CT CERVICAL SPINE FINDINGS Alignment: Maintained cervical lordosis. Intact craniocervical relationship and atlantodental interval. No jumped or perched facets. Skull base and vertebrae: Intact skull base.  No vertebral fracture. Soft tissues and spinal canal: No prevertebral soft tissue swelling. No visible canal hematoma. Disc levels: Moderate to marked disc space narrowing at C5-6 and C6-7. Uncovertebral joint osteoarthritis noted at C5-6 and C6-7 bilaterally contributing to mild left C5-6 and mild right C6-7 neural foraminal encroachment. Multilevel degenerative facet arthropathy is visualized most prominent with subcortical cystic change at C3-4 on the left. Upper chest: Negative Other: None IMPRESSION: 1. Atrophy with chronic moderate small vessel ischemia. No acute intracranial abnormality. 2. No acute cervical spine fracture or listhesis. 3. Degenerative disc disease C5-6 and C6-7 with uncovertebral joint osteoarthritis and uncinate spurring contributing to mild left C5-6 and right C6-7 neural foraminal encroachment. Multilevel degenerative facet arthropathy noted. Electronically Signed   By: Ashley Royalty M.D.   On: 05/13/2017 01:40    Pending Labs Unresulted Labs (From admission, onward)   Start     Ordered   05/14/17 0500  Creatinine, serum  Daily,   R     05/13/17 0231   05/13/17 1000  Heparin level (unfractionated)  Once-Timed,   R     05/13/17 0205   05/13/17 0453  Lactic acid,  plasma  STAT Now then every 3 hours,   R     05/13/17 0452   05/13/17 0158  Troponin I  Now then every 6 hours,   STAT     05/13/17 0200   05/13/17 0126  APTT  Add-on,   R     05/13/17 0125   05/13/17 0126  Protime-INR  Add-on,   R     05/13/17 0125   05/12/17 2326  Blood culture (routine x 2)  BLOOD CULTURE X 2,   STAT     05/12/17 2326   05/12/17 2326  Urinalysis, Routine w reflex microscopic  Once,   R     05/12/17 2326      Vitals/Pain Today's Vitals   05/13/17 0158 05/13/17 0329 05/13/17 0453 05/13/17 0600  BP: (!) 139/125 (!) 88/55 102/66 (!) 96/59  Pulse: (!) 101 96 96 91  Resp: (!) 26 (!) 25 15 (!) 26  Temp:      TempSrc:      SpO2: 98% 96% 96% 97%    Isolation Precautions No active isolations  Medications Medications  heparin ADULT infusion 100 units/mL (25000 units/212mL sodium chloride 0.45%) (750 Units/hr Intravenous New Bag/Given 05/13/17 0156)  nitroGLYCERIN (NITROSTAT) SL tablet 0.4 mg (has no administration in time range)  acetaminophen (TYLENOL) tablet 650 mg (has no administration in time range)  ondansetron (ZOFRAN) injection 4 mg (has no administration in time range)  atorvastatin (LIPITOR) tablet 40 mg (has no administration in time range)  aspirin tablet 81 mg (has no administration in time range)  Vitamin D3 TABS 2,000 Units (has no administration in time range)  Omega 3 CAPS 1,200 mg (has no administration in time range)  pantoprazole (PROTONIX) EC tablet 40 mg (has no administration in time range)  insulin aspart (novoLOG) injection 0-9 Units (5 Units Subcutaneous Given 05/13/17 0334)  vancomycin (VANCOCIN) IVPB 1000 mg/200 mL premix (has no administration in time range)  piperacillin-tazobactam (ZOSYN) IVPB 3.375 g (0 g Intravenous Stopped 05/13/17 0719)  vancomycin (VANCOCIN) IVPB 1000 mg/200 mL premix (0 mg Intravenous Stopped 05/13/17 0135)  piperacillin-tazobactam (ZOSYN) IVPB 3.375 g (0 g Intravenous Stopped 05/13/17 0134)  sodium chloride 0.9 % bolus  500 mL (0 mLs Intravenous Stopped 05/13/17 0134)  aspirin chewable tablet 324 mg (324 mg Oral Given 05/13/17 0130)  heparin bolus via infusion 4,000 Units (4,000 Units Intravenous Bolus from Bag 05/13/17 0157)  sodium chloride 0.9 % bolus 500 mL (0 mLs Intravenous Stopped 05/13/17 0612)    Mobility walks with device-cane at home

## 2017-05-13 NOTE — ED Notes (Signed)
Report given to RN 4E

## 2017-05-13 NOTE — Progress Notes (Signed)
Cardiology Consultation:   Patient ID: Chelsea [name]; [id number]; [birth year]   Admit date: 05/12/2017 Date of Consult: 05/13/2017  Primary Care Provider: Jani Gravel, MD Primary Cardiologist: [place]  Primary Electrophysiologist:     Patient Profile:   Chelsea [name] is a 84 y.o. female with a hx of chronic osteomyelitis of the left foot, HTN, diabetes mellitus, dementia venous stasis who is being seen today for the evaluation of elevated troponin level at the request of Dr. Sherral Hammers.  History of Present Illness:   Chelsea [name] 84 year old female with a history of chronic osteomyelitis of the left foot.  She was admitted to [facility] with shoulder pain and hypertension.  She has a history of diabetes mellitus, hypertension.  She was admitted to the hospital with sudden onset of generalized weakness.  She was not able to get up out of bed. Denies any previous episodes of chest discomfort.  She denies any shortness of breath.  She has chronic leg edema/venous stasis.  No syncope or presyncope.  Past Medical History:  Diagnosis Date  . Anemia    takes Ferrous Sulfate daily  . Arthritis   . Basal cell carcinoma of breast 1998  . Bruises easily   . Dizziness   . DM (diabetes mellitus) ([place])    takes Metformin daily  . Eczema   . Emphysema lung ([place])   . GERD (gastroesophageal reflux disease)    takes Protonix daily  . Glaucoma    Bil  . History of hiatal hernia   . HTN (hypertension)    takes Diovan daily  . Hyperlipidemia   . ITP (idiopathic thrombocytopenic purpura) 2/09  . Joint pain   . Joint swelling   . Nocturia   . Osteoporosis   . Peripheral edema   . Stroke ([place]) 02/03/09   mid brain, diplopia    Past Surgical History:  Procedure Laterality Date  . BREAST LUMPECTOMY WITH RADIOACTIVE SEED LOCALIZATION Right 10/23/2014   Procedure: RIGHT BREAST LUMPECTOMY WITH RADIOACTIVE SEED LOCALIZATION;  Surgeon: Fanny Skates, MD;  Location: [place];  Service: General;   Laterality: Right;  . CATARACT EXTRACTION Bilateral   . COLONOSCOPY    . ESOPHAGOGASTRODUODENOSCOPY    . MYOMECTOMY N/A 03/14/1995   late 90's per pt  . PARATHYROIDECTOMY  2008  . REFRACTIVE SURGERY Bilateral   . TONSILLECTOMY  as a child     Home Medications:  Prior to Admission medications   Medication Sig Start Date End Date Taking? Authorizing Provider  amoxicillin-clavulanate (AUGMENTIN) 875-125 MG tablet Take 1 tablet by mouth 2 (two) times daily. 05/10/17  Yes Michel Bickers, MD  aspirin 81 MG tablet Take 81 mg by mouth daily after breakfast.    Yes [provider]  Cholecalciferol (VITAMIN D3) 2000 UNITS TABS Take 2,000 Units by mouth daily.   Yes [provider]  doxycycline (MONODOX) 100 MG capsule Take 1 capsule (100 mg total) by mouth 2 (two) times daily. 05/10/17  Yes Michel Bickers, MD  furosemide (LASIX) 20 MG tablet Take 20 mg by mouth daily as needed for fluid or edema.  04/24/17  Yes [provider]  losartan (COZAAR) 100 MG tablet TAKE 1 TABLET EVERY DAY FOR BLOOD PRESSURE 03/23/17  Yes [provider]  metFORMIN (GLUCOPHAGE) 500 MG tablet Take 500 mg by mouth 2 (two) times daily with a meal.   Yes [provider]  Omega 3 1200 MG CAPS Take 1,200 mg by mouth 2 (two) times daily.  Yes [provider]  pantoprazole (PROTONIX) 40 MG tablet Take 40 mg by mouth daily after breakfast.    Yes [provider]    Inpatient Medications: Scheduled Meds: . aspirin  81 mg Oral QPC breakfast  . atorvastatin  40 mg Oral q1800  . cholecalciferol  2,000 Units Oral Daily  . insulin aspart  0-9 Units Subcutaneous Q4H  . omega-3 acid ethyl esters  1,000 mg Oral Daily  . pantoprazole  40 mg Oral QPC breakfast   Continuous Infusions: . heparin 750 Units/hr (05/13/17 0156)  . piperacillin-tazobactam (ZOSYN)  IV Stopped (05/13/17 0719)  . vancomycin     PRN Meds: acetaminophen, nitroGLYCERIN, ondansetron (ZOFRAN)  IV  Allergies:    Allergies  Allergen Reactions  . Travatan Z [Travoprost (Bak Free)] Other (See Comments)    dizziness    Social History:   Social History   Socioeconomic History  . Marital status: Divorced    Spouse name: Not on file  . Number of children: Not on file  . Years of education: Not on file  . Highest education level: Not on file  Occupational History  . Not on file  Social Needs  . Financial resource strain: Not on file  . Food insecurity:    Worry: Not on file    Inability: Not on file  . Transportation needs:    Medical: Not on file    Non-medical: Not on file  Tobacco Use  . Smoking status: Never Smoker  . Smokeless tobacco: Never Used  Substance and Sexual Activity  . Alcohol use: No    Alcohol/week: 0.0 oz  . Drug use: No  . Sexual activity: Not on file  Lifestyle  . Physical activity:    Days per week: Not on file    Minutes per session: Not on file  . Stress: Not on file  Relationships  . Social connections:    Talks on phone: Not on file    Gets together: Not on file    Attends religious service: Not on file    Active member of club or organization: Not on file    Attends meetings of clubs or organizations: Not on file    Relationship status: Not on file  . Intimate partner violence:    Fear of current or ex partner: Not on file    Emotionally abused: Not on file    Physically abused: Not on file    Forced sexual activity: Not on file  Other Topics Concern  . Not on file  Social History Narrative  . Not on file    Family History:    Family History  Problem Relation Age of Onset  . Breast cancer Mother 58  . Colon cancer Father   . Diabetes Father        questionable  . Multiple sclerosis Daughter   . Heart disease Maternal Grandfather      ROS:  Please see the history of present illness.   All other ROS reviewed and negative.     Physical Exam/Data:   Vitals:   05/13/17 0453 05/13/17 0600 05/13/17 0747 05/13/17 0906   BP: 102/66 (!) 96/59 93/60 (!) 93/56  Pulse: 96 91 94 90  Resp: 15 (!) 26 19 20   Temp:    98.6 F (37 C)  TempSrc:    Oral  SpO2: 96% 97% 96% 97%    Intake/Output Summary (Last 24 hours) at 05/13/2017 0924 Last data filed at 05/13/2017 0135 Gross per 24  hour  Intake 750 ml  Output -  Net 750 ml   General:   Elderly female, she appears to be chronically ill. HEENT: normal Lymph: no adenopathy Neck: no JVD Endocrine:  No thryomegaly Vascular: No carotid bruits; FA pulses 2+ bilaterally without bruits  Cardiac: Rate S1-S2. Lungs: Clear to auscultation. Abd: soft, nontender, no hepatomegaly  Ext: Her left foot and ankle are markedly swollen and red.  She has a chronic ulcer in the lateral aspect of her fifth toe .   Underlying bone is visible at the base of the ulcer.  Leg is quite swollen and red.  Right leg has trace edema. Musculoskeletal:   Skin: warm and dry  Neuro:  CNs 2-12 intact, no focal abnormalities noted Psych:  Normal affect   EKG:  The EKG was personally reviewed and demonstrates: Normal sinus rhythm at 97.  Occasional premature ventricular contraction.  She has poor R wave progression consistent with a anterior wall myocardial infarction.  These changes are new since previous EKG in 2016. Telemetry:  Telemetry was personally reviewed and demonstrates:    Relevant CV Studies:   Laboratory Data:  Chemistry Recent Labs  Lab 05/13/17 0058  NA 135  K 4.3  CL 100*  GLUCOSE 283*  BUN 15  CREATININE 0.80    No results for input(s): PROT, ALBUMIN, AST, ALT, ALKPHOS, BILITOT in the last 168 hours. Hematology Recent Labs  Lab 05/12/17 2326 05/13/17 0058  WBC 9.6  --   RBC 5.80*  --   HGB 16.3* 20.4*  HCT 49.5* 60.0*  MCV 85.3  --   MCH 28.1  --   MCHC 32.9  --   RDW 14.1  --   PLT 214  --    Cardiac Enzymes Recent Labs  Lab 05/13/17 0329 05/13/17 0718  TROPONINI 5.15* 5.87*    Recent Labs  Lab 05/13/17 0056  TROPIPOC 3.52*    BNPNo  results for input(s): BNP, PROBNP in the last 168 hours.  DDimer No results for input(s): DDIMER in the last 168 hours.  Radiology/Studies:  Dg Chest 2 View  Result Date: 05/13/2017 CLINICAL DATA:  84 y/o  F; right shoulder pain.  Hypotension. EXAM: CHEST - 2 VIEW COMPARISON:  01/09/2008 chest CT. FINDINGS: Stable enlarged cardiac silhouette given projection and technique. Calcific aortic atherosclerosis. Large hiatal hernia. Pulmonary vascular congestion. No pleural effusion or pneumothorax. Bones are unremarkable. IMPRESSION: Large hiatal hernia. Pulmonary vascular congestion. Mild cardiomegaly. Aortic atherosclerosis. Electronically Signed   By: Kristine Garbe M.D.   On: 05/13/2017 00:13   Dg Shoulder Right  Result Date: 05/13/2017 CLINICAL DATA:  84 y/o  F; 2 weeks of right shoulder pain. EXAM: RIGHT SHOULDER - 2+ VIEW COMPARISON:  None. FINDINGS: There is no evidence of fracture or dislocation. Mild osteoarthrosis of the glenohumeral joint with small osteophytes at the inferior margin of humeral head. Soft tissues are unremarkable. IMPRESSION: 1.  No acute fracture or dislocation identified. 2. Mild osteoarthrosis of the glenohumeral joint. Electronically Signed   By: Kristine Garbe M.D.   On: 05/13/2017 00:16   Ct Head Wo Contrast  Result Date: 05/13/2017 CLINICAL DATA:  Hypotensive. History of stroke in 2010. Right shoulder pain. EXAM: CT HEAD WITHOUT CONTRAST CT CERVICAL SPINE WITHOUT CONTRAST TECHNIQUE: Multidetector CT imaging of the head and cervical spine was performed following the standard protocol without intravenous contrast. Multiplanar CT image reconstructions of the cervical spine were also generated. COMPARISON:  CT and MRI exams of the brain from  02/03/2009 FINDINGS: CT HEAD FINDINGS Brain: Atrophy with chronic moderate small vessel ischemia. No large vascular territory infarct. No midline shift or edema. No intra-axial mass nor extra-axial fluid collections.  Midline fourth ventricle and basal cisterns without effacement. Vascular: Atherosclerosis of the cavernous carotid arteries bilaterally. No hyperdense vessel sign. Skull: No skull fracture or suspicious osseous lesions. Sinuses/Orbits: Bilateral cataract extractions. Mild ethmoid sinus mucosal thickening. No acute sinus disease. No mastoid effusion. Other: None CT CERVICAL SPINE FINDINGS Alignment: Maintained cervical lordosis. Intact craniocervical relationship and atlantodental interval. No jumped or perched facets. Skull base and vertebrae: Intact skull base.  No vertebral fracture. Soft tissues and spinal canal: No prevertebral soft tissue swelling. No visible canal hematoma. Disc levels: Moderate to marked disc space narrowing at C5-6 and C6-7. Uncovertebral joint osteoarthritis noted at C5-6 and C6-7 bilaterally contributing to mild left C5-6 and mild right C6-7 neural foraminal encroachment. Multilevel degenerative facet arthropathy is visualized most prominent with subcortical cystic change at C3-4 on the left. Upper chest: Negative Other: None IMPRESSION: 1. Atrophy with chronic moderate small vessel ischemia. No acute intracranial abnormality. 2. No acute cervical spine fracture or listhesis. 3. Degenerative disc disease C5-6 and C6-7 with uncovertebral joint osteoarthritis and uncinate spurring contributing to mild left C5-6 and right C6-7 neural foraminal encroachment. Multilevel degenerative facet arthropathy noted. Electronically Signed   By: Ashley Royalty M.D.   On: 05/13/2017 01:40   Ct Cervical Spine Wo Contrast  Result Date: 05/13/2017 CLINICAL DATA:  Hypotensive. History of stroke in 2010. Right shoulder pain. EXAM: CT HEAD WITHOUT CONTRAST CT CERVICAL SPINE WITHOUT CONTRAST TECHNIQUE: Multidetector CT imaging of the head and cervical spine was performed following the standard protocol without intravenous contrast. Multiplanar CT image reconstructions of the cervical spine were also generated.  COMPARISON:  CT and MRI exams of the brain from 02/03/2009 FINDINGS: CT HEAD FINDINGS Brain: Atrophy with chronic moderate small vessel ischemia. No large vascular territory infarct. No midline shift or edema. No intra-axial mass nor extra-axial fluid collections. Midline fourth ventricle and basal cisterns without effacement. Vascular: Atherosclerosis of the cavernous carotid arteries bilaterally. No hyperdense vessel sign. Skull: No skull fracture or suspicious osseous lesions. Sinuses/Orbits: Bilateral cataract extractions. Mild ethmoid sinus mucosal thickening. No acute sinus disease. No mastoid effusion. Other: None CT CERVICAL SPINE FINDINGS Alignment: Maintained cervical lordosis. Intact craniocervical relationship and atlantodental interval. No jumped or perched facets. Skull base and vertebrae: Intact skull base.  No vertebral fracture. Soft tissues and spinal canal: No prevertebral soft tissue swelling. No visible canal hematoma. Disc levels: Moderate to marked disc space narrowing at C5-6 and C6-7. Uncovertebral joint osteoarthritis noted at C5-6 and C6-7 bilaterally contributing to mild left C5-6 and mild right C6-7 neural foraminal encroachment. Multilevel degenerative facet arthropathy is visualized most prominent with subcortical cystic change at C3-4 on the left. Upper chest: Negative Other: None IMPRESSION: 1. Atrophy with chronic moderate small vessel ischemia. No acute intracranial abnormality. 2. No acute cervical spine fracture or listhesis. 3. Degenerative disc disease C5-6 and C6-7 with uncovertebral joint osteoarthritis and uncinate spurring contributing to mild left C5-6 and right C6-7 neural foraminal encroachment. Multilevel degenerative facet arthropathy noted. Electronically Signed   By: Ashley Royalty M.D.   On: 05/13/2017 01:40    Assessment and Plan:   1. 1.  Non-ST segment elevation myocardial infarction: The patient was admitted with generalized weakness.  She is found to have an  elevated troponin level and an EKG consistent with a prior anterior wall myocardial infarction.  He does not have any chest pain.  She denies any shortness of breath.  She is currently fairly comfortable.  We will get an echocardiogram for further evaluation.  We may ultimately need to do heart catheterization.   I hesitate to cath at this point since she is pain-free.      For questions or updates, please contact [place] Please consult [email or web address] for contact info under Cardiology/STEMI.   Signed, Mertie Moores, MD  05/13/2017 9:24 AM

## 2017-05-13 NOTE — ED Notes (Signed)
Placed PureWick in for Urine Collection. None collected at time of check. Will check again later.

## 2017-05-13 NOTE — ED Notes (Addendum)
CRITICAL VALUE ALERT  Critical Value: 5.15 Trop  Date & Time Notied:  05/13/17  Provider Notified:

## 2017-05-14 ENCOUNTER — Other Ambulatory Visit: Payer: Self-pay

## 2017-05-14 ENCOUNTER — Inpatient Hospital Stay (HOSPITAL_COMMUNITY): Payer: Medicare Other

## 2017-05-14 DIAGNOSIS — I10 Essential (primary) hypertension: Secondary | ICD-10-CM

## 2017-05-14 DIAGNOSIS — I361 Nonrheumatic tricuspid (valve) insufficiency: Secondary | ICD-10-CM

## 2017-05-14 DIAGNOSIS — I5043 Acute on chronic combined systolic (congestive) and diastolic (congestive) heart failure: Secondary | ICD-10-CM

## 2017-05-14 LAB — URINALYSIS, ROUTINE W REFLEX MICROSCOPIC
Bilirubin Urine: NEGATIVE
Glucose, UA: >=500 mg/dL — AB
Ketones, ur: 5 mg/dL — AB
Leukocytes, UA: NEGATIVE
Nitrite: NEGATIVE
Protein, ur: 100 mg/dL — AB
Specific Gravity, Urine: 1.015 (ref 1.005–1.030)
Squamous Epithelial / LPF: NONE SEEN
pH: 6 (ref 5.0–8.0)

## 2017-05-14 LAB — BASIC METABOLIC PANEL
ANION GAP: 13 (ref 5–15)
Anion gap: 12 (ref 5–15)
BUN: 26 mg/dL — ABNORMAL HIGH (ref 6–20)
BUN: 30 mg/dL — AB (ref 6–20)
CALCIUM: 7.8 mg/dL — AB (ref 8.9–10.3)
CALCIUM: 7.5 mg/dL — AB (ref 8.9–10.3)
CO2: 21 mmol/L — AB (ref 22–32)
CO2: 19 mmol/L — ABNORMAL LOW (ref 22–32)
CREATININE: 2.42 mg/dL — AB (ref 0.44–1.00)
CREATININE: 3.07 mg/dL — AB (ref 0.44–1.00)
Chloride: 99 mmol/L — ABNORMAL LOW (ref 101–111)
Chloride: 98 mmol/L — ABNORMAL LOW (ref 101–111)
GFR calc Af Amer: 20 mL/min — ABNORMAL LOW (ref >60)
GFR calc Af Amer: 15 mL/min — ABNORMAL LOW (ref >60)
GFR, EST NON AFRICAN AMERICAN: 17 mL/min — AB (ref >60)
GFR, EST NON AFRICAN AMERICAN: 13 mL/min — AB (ref >60)
GLUCOSE: 166 mg/dL — AB (ref 65–99)
GLUCOSE: 256 mg/dL — AB (ref 65–99)
POTASSIUM: 4.3 mmol/L (ref 3.5–5.1)
Potassium: 3.9 mmol/L (ref 3.5–5.1)
SODIUM: 129 mmol/L — AB (ref 135–145)
Sodium: 133 mmol/L — ABNORMAL LOW (ref 135–145)

## 2017-05-14 LAB — GLUCOSE, CAPILLARY
GLUCOSE-CAPILLARY: 155 mg/dL — AB (ref 65–99)
GLUCOSE-CAPILLARY: 176 mg/dL — AB (ref 65–99)
GLUCOSE-CAPILLARY: 194 mg/dL — AB (ref 65–99)
GLUCOSE-CAPILLARY: 197 mg/dL — AB (ref 65–99)
GLUCOSE-CAPILLARY: 119 mg/dL — AB (ref 65–99)
Glucose-Capillary: 209 mg/dL — ABNORMAL HIGH (ref 65–99)

## 2017-05-14 LAB — HEPARIN LEVEL (UNFRACTIONATED): HEPARIN UNFRACTIONATED: 0.29 [IU]/mL — AB (ref 0.30–0.70)

## 2017-05-14 LAB — MAGNESIUM: Magnesium: 1.8 mg/dL (ref 1.7–2.4)

## 2017-05-14 LAB — BRAIN NATRIURETIC PEPTIDE: B NATRIURETIC PEPTIDE 5: 219 pg/mL — AB (ref 0.0–100.0)

## 2017-05-14 LAB — CBC
HEMATOCRIT: 34.9 % — AB (ref 36.0–46.0)
Hemoglobin: 11.1 g/dL — ABNORMAL LOW (ref 12.0–15.0)
MCH: 27.3 pg (ref 26.0–34.0)
MCHC: 31.8 g/dL (ref 30.0–36.0)
MCV: 86 fL (ref 78.0–100.0)
Platelets: 229 10*3/uL (ref 150–400)
RBC: 4.06 MIL/uL (ref 3.87–5.11)
RDW: 14.6 % (ref 11.5–15.5)
WBC: 10.1 10*3/uL (ref 4.0–10.5)

## 2017-05-14 LAB — ECHOCARDIOGRAM COMPLETE
HEIGHTINCHES: 64 in
WEIGHTICAEL: 3040.58 [oz_av]

## 2017-05-14 LAB — MRSA PCR SCREENING: MRSA BY PCR: NEGATIVE

## 2017-05-14 MED ORDER — ACETAMINOPHEN 325 MG PO TABS
650.0000 mg | ORAL_TABLET | ORAL | Status: DC | PRN
  Administered 2017-05-16 – 2017-05-25 (×6): 650 mg via ORAL
  Filled 2017-05-14 (×7): qty 2

## 2017-05-14 MED ORDER — HEPARIN BOLUS VIA INFUSION
2000.0000 [IU] | Freq: Once | INTRAVENOUS | Status: AC
  Administered 2017-05-14: 2000 [IU] via INTRAVENOUS
  Filled 2017-05-14: qty 2000

## 2017-05-14 MED ORDER — AMOXICILLIN-POT CLAVULANATE 500-125 MG PO TABS
500.0000 mg | ORAL_TABLET | Freq: Two times a day (BID) | ORAL | Status: DC
  Administered 2017-05-14: 500 mg via ORAL
  Filled 2017-05-14 (×2): qty 1

## 2017-05-14 MED ORDER — MENTHOL 3 MG MT LOZG
1.0000 | LOZENGE | OROMUCOSAL | Status: DC | PRN
  Filled 2017-05-14 (×2): qty 9

## 2017-05-14 MED ORDER — INSULIN ASPART 100 UNIT/ML ~~LOC~~ SOLN
0.0000 [IU] | Freq: Every day | SUBCUTANEOUS | Status: DC
  Administered 2017-05-25: 1 [IU] via SUBCUTANEOUS

## 2017-05-14 MED ORDER — INSULIN ASPART 100 UNIT/ML ~~LOC~~ SOLN
0.0000 [IU] | Freq: Three times a day (TID) | SUBCUTANEOUS | Status: DC
  Administered 2017-05-15: 2 [IU] via SUBCUTANEOUS
  Administered 2017-05-15 – 2017-05-16 (×2): 1 [IU] via SUBCUTANEOUS
  Administered 2017-05-16: 2 [IU] via SUBCUTANEOUS
  Administered 2017-05-17 (×2): 1 [IU] via SUBCUTANEOUS
  Administered 2017-05-17 – 2017-05-20 (×4): 2 [IU] via SUBCUTANEOUS
  Administered 2017-05-20 (×2): 1 [IU] via SUBCUTANEOUS
  Administered 2017-05-21: 2 [IU] via SUBCUTANEOUS
  Administered 2017-05-21 – 2017-05-22 (×3): 1 [IU] via SUBCUTANEOUS
  Administered 2017-05-23: 2 [IU] via SUBCUTANEOUS
  Administered 2017-05-23: 1 [IU] via SUBCUTANEOUS
  Administered 2017-05-24 – 2017-05-26 (×2): 2 [IU] via SUBCUTANEOUS
  Administered 2017-05-26: 1 [IU] via SUBCUTANEOUS
  Administered 2017-05-27: 2 [IU] via SUBCUTANEOUS
  Administered 2017-05-27 (×2): 1 [IU] via SUBCUTANEOUS
  Administered 2017-05-28: 2 [IU] via SUBCUTANEOUS
  Administered 2017-05-29: 3 [IU] via SUBCUTANEOUS

## 2017-05-14 MED ORDER — PHENOL 1.4 % MT LIQD
1.0000 | OROMUCOSAL | Status: DC | PRN
  Administered 2017-05-14 – 2017-05-17 (×3): 1 via OROMUCOSAL
  Filled 2017-05-14: qty 177

## 2017-05-14 MED ORDER — DOXYCYCLINE HYCLATE 100 MG PO TABS
100.0000 mg | ORAL_TABLET | Freq: Two times a day (BID) | ORAL | Status: DC
  Administered 2017-05-14 – 2017-05-29 (×30): 100 mg via ORAL
  Filled 2017-05-14 (×30): qty 1

## 2017-05-14 MED ORDER — SODIUM CHLORIDE 0.9 % IV SOLN
INTRAVENOUS | Status: DC
  Administered 2017-05-14 – 2017-05-16 (×2): via INTRAVENOUS

## 2017-05-14 MED ORDER — SODIUM CHLORIDE 0.9 % IV BOLUS
500.0000 mL | Freq: Once | INTRAVENOUS | Status: AC
  Administered 2017-05-14: 500 mL via INTRAVENOUS

## 2017-05-14 MED ORDER — PIPERACILLIN-TAZOBACTAM 3.375 G IVPB
3.3750 g | Freq: Two times a day (BID) | INTRAVENOUS | Status: DC
  Filled 2017-05-14: qty 50

## 2017-05-14 NOTE — Progress Notes (Signed)
Pt has not voided since I&O cath at 0130. Bladder scan shows 219ml. Pt not able to void on bedpan. Pt states that she does not need to void. Abdomen soft. MD paged. Will continue to monitor.  Clyde Canterbury, RN

## 2017-05-14 NOTE — Plan of Care (Signed)
  Problem: Education: Goal: Knowledge of General Education information will improve 05/14/2017 2154 by Drenda Freeze, RN Outcome: Progressing 05/14/2017 2148 by Drenda Freeze, RN Outcome: Progressing   Problem: Health Behavior/Discharge Planning: Goal: Ability to manage health-related needs will improve Outcome: Progressing

## 2017-05-14 NOTE — Progress Notes (Addendum)
The patient has been seen in conjunction with Vin Bhagat, PAC. All aspects of care have been considered and discussed. The patient has been personally interviewed, examined, and all clinical data has been reviewed.   Acute kidney injury of uncertain etiology.  Dramatic more than doubling is very worrisome.  Cancel cath and not likely to have the procedure performed anytime soon.  LV EF needs to be assessed with echocardiography.  Nephrotoxins need to be discontinued (NSAIDs,RAAS inhibitors, and other).  Consider nephrology consultation.    Suspect some component of heart failure.  Need to check a BNP.  Progress Note  Patient Name: Chelsea [name] Date of Encounter: 05/14/2017  Primary Cardiologist: Mertie Moores, MD   Subjective   Creatinine bumped to 2.42 today from 0.8 yesterday. unknown reason. However she did received 500cc of bolos x 3 yesterday and this morning after labs has been drawn. Will cancell cath today she she already had her breakfast at 9am. Check stat BMET to guide therapy Likely due to Vancomycin and Zosyn. Discussed with pharmacy. Now off Vancomycin.   Inpatient Medications    Scheduled Meds: . aspirin  81 mg Oral QPC breakfast  . atorvastatin  40 mg Oral q1800  . cholecalciferol  2,000 Units Oral Daily  . insulin aspart  0-9 Units Subcutaneous Q4H  . omega-3 acid ethyl esters  1,000 mg Oral Daily  . pantoprazole  40 mg Oral QPC breakfast   Continuous Infusions: . heparin 1,200 Units/hr (05/14/17 0042)  . piperacillin-tazobactam (ZOSYN)  IV     PRN Meds: acetaminophen, nitroGLYCERIN, ondansetron (ZOFRAN) IV   Vital Signs    Vitals:   05/13/17 2347 05/14/17 0300 05/14/17 0426 05/14/17 0802  BP: 104/61  103/61 101/62  Pulse: 92  91 85  Resp: (!) 23  20 (!) 22  Temp: 98 F (36.7 C)  98.5 F (36.9 C) 98.7 F (37.1 C)  TempSrc: Oral  Oral Oral  SpO2: 97%  95% 97%  Weight:  190 lb 0.6 oz (86.2 kg)    Height:  5\' 4"  (1.626 m)      Intake/Output  Summary (Last 24 hours) at 05/14/2017 1001 Last data filed at 05/14/2017 0554 Gross per 24 hour  Intake 909.5 ml  Output 575 ml  Net 334.5 ml   Filed Weights   05/14/17 0300  Weight: 190 lb 0.6 oz (86.2 kg)    Telemetry    NSR - Personally Reviewed  ECG    SR with PACs - Personally Reviewed  Physical Exam   GEN: No acute distress.   Neck: No JVD Cardiac: RRR, no murmurs, rubs, or gallops.  Respiratory: Clear to auscultation bilaterally. GI: Soft, nontender, non-distended  MS: 1+ BL LE edema with erythema.  No deformity. Neuro:  Nonfocal  Psych: Normal affect   Labs    Chemistry Recent Labs  Lab 05/13/17 0058 05/14/17 0203  NA 135 133*  K 4.3 3.9  CL 100* 99*  CO2  --  21*  GLUCOSE 283* 166*  BUN 15 26*  CREATININE 0.80 2.42*  CALCIUM  --  7.8*  GFRNONAA  --  17*  GFRAA  --  20*  ANIONGAP  --  13     Hematology Recent Labs  Lab 05/12/17 2326 05/13/17 0058 05/13/17 0944 05/14/17 0203  WBC 9.6  --  11.9* 10.1  RBC 5.80*  --  4.43 4.06  HGB 16.3* 20.4* 12.1 11.1*  HCT 49.5* 60.0* 37.5 34.9*  MCV 85.3  --  84.7  86.0  MCH 28.1  --  27.3 27.3  MCHC 32.9  --  32.3 31.8  RDW 14.1  --  14.2 14.6  PLT 214  --  234 229    Cardiac Enzymes Recent Labs  Lab 05/13/17 0329 05/13/17 0718  TROPONINI 5.15* 5.87*    Recent Labs  Lab 05/13/17 0056  TROPIPOC 3.52*     BNPNo results for input(s): BNP, PROBNP in the last 168 hours.   DDimer No results for input(s): DDIMER in the last 168 hours.   Radiology    Dg Chest 2 View  Result Date: 05/13/2017 CLINICAL DATA:  84 y/o  F; right shoulder pain.  Hypotension. EXAM: CHEST - 2 VIEW COMPARISON:  01/09/2008 chest CT. FINDINGS: Stable enlarged cardiac silhouette given projection and technique. Calcific aortic atherosclerosis. Large hiatal hernia. Pulmonary vascular congestion. No pleural effusion or pneumothorax. Bones are unremarkable. IMPRESSION: Large hiatal hernia. Pulmonary vascular congestion. Mild  cardiomegaly. Aortic atherosclerosis. Electronically Signed   By: Kristine Garbe M.D.   On: 05/13/2017 00:13   Dg Shoulder Right  Result Date: 05/13/2017 CLINICAL DATA:  84 y/o  F; 2 weeks of right shoulder pain. EXAM: RIGHT SHOULDER - 2+ VIEW COMPARISON:  None. FINDINGS: There is no evidence of fracture or dislocation. Mild osteoarthrosis of the glenohumeral joint with small osteophytes at the inferior margin of humeral head. Soft tissues are unremarkable. IMPRESSION: 1.  No acute fracture or dislocation identified. 2. Mild osteoarthrosis of the glenohumeral joint. Electronically Signed   By: Kristine Garbe M.D.   On: 05/13/2017 00:16   Ct Head Wo Contrast  Result Date: 05/13/2017 CLINICAL DATA:  Hypotensive. History of stroke in 2010. Right shoulder pain. EXAM: CT HEAD WITHOUT CONTRAST CT CERVICAL SPINE WITHOUT CONTRAST TECHNIQUE: Multidetector CT imaging of the head and cervical spine was performed following the standard protocol without intravenous contrast. Multiplanar CT image reconstructions of the cervical spine were also generated. COMPARISON:  CT and MRI exams of the brain from 02/03/2009 FINDINGS: CT HEAD FINDINGS Brain: Atrophy with chronic moderate small vessel ischemia. No large vascular territory infarct. No midline shift or edema. No intra-axial mass nor extra-axial fluid collections. Midline fourth ventricle and basal cisterns without effacement. Vascular: Atherosclerosis of the cavernous carotid arteries bilaterally. No hyperdense vessel sign. Skull: No skull fracture or suspicious osseous lesions. Sinuses/Orbits: Bilateral cataract extractions. Mild ethmoid sinus mucosal thickening. No acute sinus disease. No mastoid effusion. Other: None CT CERVICAL SPINE FINDINGS Alignment: Maintained cervical lordosis. Intact craniocervical relationship and atlantodental interval. No jumped or perched facets. Skull base and vertebrae: Intact skull base.  No vertebral fracture. Soft  tissues and spinal canal: No prevertebral soft tissue swelling. No visible canal hematoma. Disc levels: Moderate to marked disc space narrowing at C5-6 and C6-7. Uncovertebral joint osteoarthritis noted at C5-6 and C6-7 bilaterally contributing to mild left C5-6 and mild right C6-7 neural foraminal encroachment. Multilevel degenerative facet arthropathy is visualized most prominent with subcortical cystic change at C3-4 on the left. Upper chest: Negative Other: None IMPRESSION: 1. Atrophy with chronic moderate small vessel ischemia. No acute intracranial abnormality. 2. No acute cervical spine fracture or listhesis. 3. Degenerative disc disease C5-6 and C6-7 with uncovertebral joint osteoarthritis and uncinate spurring contributing to mild left C5-6 and right C6-7 neural foraminal encroachment. Multilevel degenerative facet arthropathy noted. Electronically Signed   By: Ashley Royalty M.D.   On: 05/13/2017 01:40   Ct Cervical Spine Wo Contrast  Result Date: 05/13/2017 CLINICAL DATA:  Hypotensive. History of stroke in  2010. Right shoulder pain. EXAM: CT HEAD WITHOUT CONTRAST CT CERVICAL SPINE WITHOUT CONTRAST TECHNIQUE: Multidetector CT imaging of the head and cervical spine was performed following the standard protocol without intravenous contrast. Multiplanar CT image reconstructions of the cervical spine were also generated. COMPARISON:  CT and MRI exams of the brain from 02/03/2009 FINDINGS: CT HEAD FINDINGS Brain: Atrophy with chronic moderate small vessel ischemia. No large vascular territory infarct. No midline shift or edema. No intra-axial mass nor extra-axial fluid collections. Midline fourth ventricle and basal cisterns without effacement. Vascular: Atherosclerosis of the cavernous carotid arteries bilaterally. No hyperdense vessel sign. Skull: No skull fracture or suspicious osseous lesions. Sinuses/Orbits: Bilateral cataract extractions. Mild ethmoid sinus mucosal thickening. No acute sinus disease. No  mastoid effusion. Other: None CT CERVICAL SPINE FINDINGS Alignment: Maintained cervical lordosis. Intact craniocervical relationship and atlantodental interval. No jumped or perched facets. Skull base and vertebrae: Intact skull base.  No vertebral fracture. Soft tissues and spinal canal: No prevertebral soft tissue swelling. No visible canal hematoma. Disc levels: Moderate to marked disc space narrowing at C5-6 and C6-7. Uncovertebral joint osteoarthritis noted at C5-6 and C6-7 bilaterally contributing to mild left C5-6 and mild right C6-7 neural foraminal encroachment. Multilevel degenerative facet arthropathy is visualized most prominent with subcortical cystic change at C3-4 on the left. Upper chest: Negative Other: None IMPRESSION: 1. Atrophy with chronic moderate small vessel ischemia. No acute intracranial abnormality. 2. No acute cervical spine fracture or listhesis. 3. Degenerative disc disease C5-6 and C6-7 with uncovertebral joint osteoarthritis and uncinate spurring contributing to mild left C5-6 and right C6-7 neural foraminal encroachment. Multilevel degenerative facet arthropathy noted. Electronically Signed   By: Ashley Royalty M.D.   On: 05/13/2017 01:40    Cardiac Studies   Pending echo and cath  Patient Profile     Chelsea [name] is a 84 y.o. female with a hx of chronic osteomyelitis of the left foot, HTN, diabetes mellitus, dementia venous stasis admitted to the hospital with sudden onset of generalized weakness. Cardiology is seen for evaluation of elevated troponin level at the request of Dr. Sherral Hammers.  Assessment & Plan    1. NSTEMI - Peak of troponin 5.8. She is chest pain free. Cath will be tomorrow due to AKI and she already has eaten. Continue heparin, ASA and statin.   2. AKI - Likely due to Abx. She has received multiple bolus of fluids yesterday. Will check stat BEMT to determine fluid rate for cath tomorrow.  3. Chronic osteomyelitis of foot with venous statis - Per  attending team.   For questions or updates, please contact [place] Please consult [email or web address] for contact info under Cardiology/STEMI.      Jarrett Soho, PA  05/14/2017, 10:01 AM

## 2017-05-14 NOTE — Progress Notes (Signed)
ANTICOAGULATION CONSULT NOTE - Follow Up Consult  Pharmacy Consult for heparin Indication: NSTEMI  Labs: Recent Labs    05/12/17 2326 05/13/17 0058 05/13/17 0329 05/13/17 0718 05/13/17 0944 05/13/17 1335 05/13/17 2303  HGB 16.3* 20.4*  --   --  12.1  --   --   HCT 49.5* 60.0*  --   --  37.5  --   --   PLT 214  --   --   --  234  --   --   HEPARINUNFRC  --   --   --   --   --  <0.10* 0.11*  CREATININE  --  0.80  --   --   --   --   --   TROPONINI  --   --  5.15* 5.87*  --   --   --     Assessment: 84yo female remains subtherapeutic on heparin after rate changes.  Goal of Therapy:  Heparin level 0.3-0.7 units/ml   Plan:  Will rebolus with heparin 2000 units and increase heparin gtt by 3 units/kg/hr to 1200 units/hr and check level in 8 hours.    Wynona Neat, PharmD, BCPS  05/14/2017,12:22 AM

## 2017-05-14 NOTE — Plan of Care (Signed)
  Problem: Education: Goal: Knowledge of General Education information will improve Outcome: Progressing   

## 2017-05-14 NOTE — Progress Notes (Signed)
Pharmacy Antibiotic Note  Chelsea [name] is a 84 y.o. female admitted on 05/12/2017 with osteomyelitis of left foot .  Pharmacy has been consulted for zosyn and vancomycin dosing. Of note- ID following as outpt (last note 4/4) and plan was to continue her PTA oral antibiotics for 2 more weeks)  Started on broad spectrum abx for ? foot osteo. On po abx PTA for chronic osteo. Not clear if IV abx are needed. ID following as outpt (last note 4/4) and plan was to  continue her current oral antibiotics for 2 more weeks) Afebrile, WBC wnl.  Plan: Change Zosyn 3.375 gm IV to q12h (4 hour infusion) Hold vancomycin due to AKI Monitor clinical picture, renal function, VT prn F/U C&S, abx deescalation / LOT  Temp (24hrs), Avg:98.1 F (36.7 C), Min:97.6 F (36.4 C), Max:98.7 F (37.1 C)  Recent Labs  Lab 05/12/17 2326 05/13/17 0058 05/13/17 0514 05/13/17 0718 05/13/17 0944 05/14/17 0203  WBC 9.6  --   --   --  11.9* 10.1  CREATININE  --  0.80  --   --   --  2.42*  LATICACIDVEN  --   --  1.6 1.4  --   --     Estimated Creatinine Clearance: 17.7 mL/min (A) (by C-G formula based on SCr of 2.42 mg/dL (H)).    Allergies  Allergen Reactions  . Travatan Z [Travoprost (Bak Free)] Other (See Comments)    dizziness    Antimicrobials this admission: 4/7 zosyn >>  4/7 vancomycin >>   Dose adjustments this admission: n/a  Microbiology results:  BCx: sent  MRSA PCR: negative  Thank you for allowing pharmacy to be a part of this patient's care.  Reginia Naas 05/14/2017 9:53 AM

## 2017-05-14 NOTE — Progress Notes (Signed)
[place] for IV heparin Indication: chest pain/ACS  Allergies  Allergen Reactions  . Travatan Z [Travoprost (Bak Free)] Other (See Comments)    dizziness    Patient Measurements: Height: 5\' 4"  (162.6 cm) Weight: 190 lb 0.6 oz (86.2 kg) IBW/kg (Calculated) : 54.7 Heparin Dosing Weight: 61 kg  Vital Signs: Temp: 98.7 F (37.1 C) (04/08 0802) Temp Source: Oral (04/08 0802) BP: 101/62 (04/08 0802) Pulse Rate: 85 (04/08 0802)  Labs: Recent Labs    05/12/17 2326 05/13/17 0058 05/13/17 0329 05/13/17 0718 05/13/17 0944 05/13/17 1335 05/13/17 2303 05/14/17 0203 05/14/17 0813  HGB 16.3* 20.4*  --   --  12.1  --   --  11.1*  --   HCT 49.5* 60.0*  --   --  37.5  --   --  34.9*  --   PLT 214  --   --   --  234  --   --  229  --   HEPARINUNFRC  --   --   --   --   --  <0.10* 0.11*  --  0.29*  CREATININE  --  0.80  --   --   --   --   --  2.42*  --   TROPONINI  --   --  5.15* 5.87*  --   --   --   --   --     Estimated Creatinine Clearance: 17.7 mL/min (A) (by C-G formula based on SCr of 2.42 mg/dL (H)).   Medical History: Past Medical History:  Diagnosis Date  . Anemia    takes Ferrous Sulfate daily  . Arthritis   . Basal cell carcinoma of breast 1998  . Bruises easily   . Dizziness   . DM (diabetes mellitus) ([place])    takes Metformin daily  . Eczema   . Emphysema lung ([place])   . GERD (gastroesophageal reflux disease)    takes Protonix daily  . Glaucoma    Bil  . History of hiatal hernia   . HTN (hypertension)    takes Diovan daily  . Hyperlipidemia   . ITP (idiopathic thrombocytopenic purpura) 2/09  . Joint pain   . Joint swelling   . Nocturia   . Osteoporosis   . Peripheral edema   . Stroke ([place]) 02/03/09   mid brain, diplopia    Medications:  Scheduled:  . aspirin  81 mg Oral QPC breakfast  . atorvastatin  40 mg Oral q1800  . cholecalciferol  2,000 Units Oral Daily  . insulin aspart  0-9 Units Subcutaneous Q4H   . omega-3 acid ethyl esters  1,000 mg Oral Daily  . pantoprazole  40 mg Oral QPC breakfast   Infusions:  . heparin 1,200 Units/hr (05/14/17 0042)  . piperacillin-tazobactam (ZOSYN)  IV      Assessment: 54 yoF c/o shoulder pain and hypotension found to have NSTMEI. On heparin for ACS. Last heparin level up to 0.29. Hgb 11.1, plts wnl.  Goal of Therapy:  Heparin level 0.3-0.7 units/ml Monitor platelets by anticoagulation protocol: Yes   Plan:  Increase heparin gtt slightly to 1,250 units/hr Monitor daily heparin level, CBC, s/s of bleed  Chelsea [name], PharmD, [facility] Clinical Pharmacist Pager [phone] 05/14/2017 9:51 AM

## 2017-05-14 NOTE — Progress Notes (Signed)
[facility] TEAM 1 - [place]  SEG:[id number] DOB: 12/07/1928 DOA: 05/12/2017 PCP: Jani Gravel, MD    Brief Narrative:  84y.o. F w/ a Hx of CVA, DM2 , HTN, Basal cell carcinoma of breast 1998, COPD, HLD, ITP, Hiatal hernia, and Chronic Osteomyelitis ofLfoot on chronic PO ABx who presented to the ED with c/o R shoulder pain. She called EMS after being unable to get out of bed.  Per EMS initial SBP was 70.  In the ED Trop was 3.5. EKG showed inferior ST depressions.  Significant Events: 4/06 admit  Subjective: Resting in bed.  C/o sore throat which started this morning when she woke up.  Denies cp, sob, f/c, n/v, or abdom pain.    Assessment & Plan:  NSTEMI  Elevated troponin - EKG consistent with a prior anterior wall MI - Cards following but unable to cath due to progressive kidney failure   Acute kidney failure   crt has been rapidly climbing - having some retention issues - will place foley to assure bladder is fully and consistently decompressed and to allow accurate Is/Os - stop Vanc - hydrate - avoid potential nephrotoxins - check renal US to r/o hydro  Recent Labs  Lab 05/13/17 0058 05/14/17 0203 05/14/17 1008  CREATININE 0.80 2.42* 3.07*    Altered mental status  CT head noted chronic moderate small vessel disease w/ atrophy - suspect there is a component of baseline dementia - check full metabolic w/u   Chronic osteomyelitis L foot Change back to usual home oral abx regimen - followed by Dr. Megan Salon in [facility]   DM 2 controlled with complication 7/37 T0G 6.8 - CBG reasonably controlled at this time - follow   HTN Not an active problem at this time   COPD Well compensated  DVT prophylaxis: IV heparin  Code Status: FULL CODE Family Communication: no family present at time of exam  Disposition Plan: SDU   Consultants:  Cardiology   Antimicrobials:  Augmentin  Doxycycline   Objective: Blood pressure 103/65, pulse 89,  temperature 98.3 F (36.8 C), temperature source Oral, resp. rate (!) 28, height 5\' 4"  (1.626 m), weight 86.2 kg (190 lb 0.6 oz), SpO2 96 %.  Intake/Output Summary (Last 24 hours) at 05/14/2017 1629 Last data filed at 05/14/2017 1100 Gross per 24 hour  Intake 1149.5 ml  Output 821 ml  Net 328.5 ml   Filed Weights   05/14/17 0300  Weight: 86.2 kg (190 lb 0.6 oz)    Examination: General: No acute respiratory distress Lungs: Clear to auscultation bilaterally without wheezes or crackles Cardiovascular: Regular rate and rhythm without murmur gallop or rub normal S1 and S2 Abdomen: Nontender, nondistended, soft, bowel sounds positive, no rebound, no ascites, no appreciable mass Extremities: L LE w/ deformity at ankle - limited ability to move L foot - 2++ edema of L foot and up LE w/ erythema at ankle   CBC: Recent Labs  Lab 05/12/17 2326 05/13/17 0058 05/13/17 0944 05/14/17 0203  WBC 9.6  --  11.9* 10.1  NEUTROABS 8.1*  --  9.2*  --   HGB 16.3* 20.4* 12.1 11.1*  HCT 49.5* 60.0* 37.5 34.9*  MCV 85.3  --  84.7 86.0  PLT 214  --  234 269   Basic Metabolic Panel: Recent Labs  Lab 05/13/17 0058 05/14/17 0203 05/14/17 1008  NA 135 133* 129*  K 4.3 3.9 4.3  CL 100* 99* 98*  CO2  --  21*  19*  GLUCOSE 283* 166* 256*  BUN 15 26* 30*  CREATININE 0.80 2.42* 3.07*  CALCIUM  --  7.8* 7.5*  MG  --  1.8  --    GFR: Estimated Creatinine Clearance: 14 mL/min (A) (by C-G formula based on SCr of 3.07 mg/dL (H)).  Liver Function Tests: No results for input(s): AST, ALT, ALKPHOS, BILITOT, PROT, ALBUMIN in the last 168 hours. No results for input(s): LIPASE, AMYLASE in the last 168 hours. No results for input(s): AMMONIA in the last 168 hours.  Coagulation Profile: No results for input(s): INR, PROTIME in the last 168 hours.  Cardiac Enzymes: Recent Labs  Lab 05/13/17 0329 05/13/17 0718  TROPONINI 5.15* 5.87*    HbA1C: Hgb A1c MFr Bld  Date/Time Value Ref Range Status    04/17/2017 11:46 AM 6.8 (H) <5.7 % of total Hgb Final    Comment:    For someone without known diabetes, a hemoglobin A1c value of 6.5% or greater indicates that they may have  diabetes and this should be confirmed with a follow-up  test. . For someone with known diabetes, a value <7% indicates  that their diabetes is well controlled and a value  greater than or equal to 7% indicates suboptimal  control. A1c targets should be individualized based on  duration of diabetes, age, comorbid conditions, and  other considerations. . Currently, no consensus exists regarding use of hemoglobin A1c for diagnosis of diabetes for children. Marland Kitchen   10/20/2014 02:17 PM 7.2 (H) 4.8 - 5.6 % Final    Comment:    (NOTE)         Pre-diabetes: 5.7 - 6.4         Diabetes: >6.4         Glycemic control for adults with diabetes: <7.0     CBG: Recent Labs  Lab 05/14/17 0423 05/14/17 0743 05/14/17 1134 05/14/17 1240 05/14/17 1620  GLUCAP 155* 176* 209* 194* 197*    Recent Results (from the past 240 hour(s))  Blood culture (routine x 2)     Status: None (Preliminary result)   Collection Time: 05/12/17 11:26 PM  Result Value Ref Range Status   Specimen Description   Final    RIGHT ANTECUBITAL Performed at [facility] [address], [place], [place] [address]    Special Requests   Final    BOTTLES DRAWN AEROBIC AND ANAEROBIC Blood Culture adequate volume Performed at [facility] [address], [place], [place] [address]    Culture   Final    NO GROWTH 1 DAY Performed at [facility], [place] [address], [place], [place] [address]    Report Status PENDING  Incomplete  Blood culture (routine x 2)     Status: None (Preliminary result)   Collection Time: 05/12/17 11:31 PM  Result Value Ref Range Status   Specimen Description   Final    BLOOD LEFT HAND Performed at [facility] [address], [place], [place] [address]     Special Requests   Final    BOTTLES DRAWN AEROBIC AND ANAEROBIC Blood Culture adequate volume Performed at [facility] [address] [place], [place] [address]    Culture   Final    NO GROWTH 1 DAY Performed at [facility], [place] [address], [place], [place] [address]    Report Status PENDING  Incomplete  MRSA PCR Screening     Status: None   Collection Time:  05/14/17  5:59 AM  Result Value Ref Range Status   MRSA by PCR NEGATIVE NEGATIVE Final    Comment:        The GeneXpert MRSA Assay (FDA approved for NASAL specimens only), is one component of a comprehensive MRSA colonization surveillance program. It is not intended to diagnose MRSA infection nor to guide or monitor treatment for MRSA infections. Performed at [facility], [place] [address] [place],  [address]      Scheduled Meds: . aspirin  81 mg Oral QPC breakfast  . atorvastatin  40 mg Oral q1800  . cholecalciferol  2,000 Units Oral Daily  . insulin aspart  0-9 Units Subcutaneous Q4H  . omega-3 acid ethyl esters  1,000 mg Oral Daily  . pantoprazole  40 mg Oral QPC breakfast   Continuous Infusions: . heparin 1,250 Units/hr (05/14/17 1006)  . piperacillin-tazobactam (ZOSYN)  IV       LOS: 1 day   Cherene Altes, MD [facility] Office  [phone] Pager - Text Page per Amion as per below:  On-Call/Text Page:      [email or web address]      password [email or web address]  If 7PM-7AM, please contact night-coverage [email or web address] Password [facility] 05/14/2017, 4:29 PM

## 2017-05-14 NOTE — Progress Notes (Signed)
  Echocardiogram 2D Echocardiogram has been performed.  Chelsea [name] 05/14/2017, 1:13 PM

## 2017-05-14 NOTE — Progress Notes (Signed)
Pt has not voided since 0100 (I&O cath), 44ml shown via bladder scan. BUN and Creat also elevated. NP Bodenhiemer made aware, also made aware that urine from I&O cath was dark, tea colored. New orders received will continue to monitor.

## 2017-05-15 ENCOUNTER — Encounter (HOSPITAL_COMMUNITY)
Admission: EM | Disposition: A | Discharge status: Skilled Nursing Facility | Payer: Self-pay | Source: Home / Self Care | Attending: Internal Medicine

## 2017-05-15 DIAGNOSIS — E1152 Type 2 diabetes mellitus with diabetic peripheral angiopathy with gangrene: Secondary | ICD-10-CM

## 2017-05-15 DIAGNOSIS — N179 Acute kidney failure, unspecified: Secondary | ICD-10-CM

## 2017-05-15 LAB — CBC
HCT: 30.8 % — ABNORMAL LOW (ref 36.0–46.0)
Hemoglobin: 9.8 g/dL — ABNORMAL LOW (ref 12.0–15.0)
MCH: 26.8 pg (ref 26.0–34.0)
MCHC: 31.8 g/dL (ref 30.0–36.0)
MCV: 84.4 fL (ref 78.0–100.0)
PLATELETS: 271 10*3/uL (ref 150–400)
RBC: 3.65 MIL/uL — ABNORMAL LOW (ref 3.87–5.11)
RDW: 14.3 % (ref 11.5–15.5)
WBC: 9.8 10*3/uL (ref 4.0–10.5)

## 2017-05-15 LAB — BASIC METABOLIC PANEL
Anion gap: 11 (ref 5–15)
BUN: 38 mg/dL — AB (ref 6–20)
CALCIUM: 7.8 mg/dL — AB (ref 8.9–10.3)
CO2: 20 mmol/L — ABNORMAL LOW (ref 22–32)
CREATININE: 4.04 mg/dL — AB (ref 0.44–1.00)
Chloride: 101 mmol/L (ref 101–111)
GFR calc Af Amer: 11 mL/min — ABNORMAL LOW (ref >60)
GFR, EST NON AFRICAN AMERICAN: 9 mL/min — AB (ref >60)
GLUCOSE: 117 mg/dL — AB (ref 65–99)
Potassium: 4.4 mmol/L (ref 3.5–5.1)
Sodium: 132 mmol/L — ABNORMAL LOW (ref 135–145)

## 2017-05-15 LAB — MAGNESIUM: Magnesium: 1.8 mg/dL (ref 1.7–2.4)

## 2017-05-15 LAB — GLUCOSE, CAPILLARY
GLUCOSE-CAPILLARY: 154 mg/dL — AB (ref 65–99)
GLUCOSE-CAPILLARY: 141 mg/dL — AB (ref 65–99)
Glucose-Capillary: 123 mg/dL — ABNORMAL HIGH (ref 65–99)
Glucose-Capillary: 158 mg/dL — ABNORMAL HIGH (ref 65–99)
Glucose-Capillary: 116 mg/dL — ABNORMAL HIGH (ref 65–99)

## 2017-05-15 LAB — HEPARIN LEVEL (UNFRACTIONATED): Heparin Unfractionated: 0.31 IU/mL (ref 0.30–0.70)

## 2017-05-15 SURGERY — LEFT HEART CATH AND CORONARY ANGIOGRAPHY
Anesthesia: LOCAL

## 2017-05-15 MED ORDER — SORBITOL 70 % SOLN
15.0000 mL | Freq: Every day | Status: DC | PRN
  Filled 2017-05-15: qty 30

## 2017-05-15 MED ORDER — SORBITOL 70 % SOLN
30.0000 mL | Freq: Every day | Status: DC | PRN
  Administered 2017-05-15: 30 mL via ORAL
  Administered 2017-05-29: 60 mL via ORAL
  Filled 2017-05-15 (×3): qty 60

## 2017-05-15 MED ORDER — BISACODYL 10 MG RE SUPP: 10.0000 mg | Freq: Every day | RECTAL | Status: DC | PRN

## 2017-05-15 MED ORDER — SENNOSIDES-DOCUSATE SODIUM 8.6-50 MG PO TABS
1.0000 | ORAL_TABLET | Freq: Two times a day (BID) | ORAL | Status: DC
  Administered 2017-05-16 – 2017-05-29 (×23): 1 via ORAL
  Filled 2017-05-15 (×26): qty 1

## 2017-05-15 MED ORDER — AMOXICILLIN-POT CLAVULANATE 500-125 MG PO TABS
500.0000 mg | ORAL_TABLET | Freq: Every day | ORAL | Status: DC
  Administered 2017-05-15 – 2017-05-29 (×15): 500 mg via ORAL
  Filled 2017-05-15 (×15): qty 1

## 2017-05-15 NOTE — Progress Notes (Addendum)
The patient has been seen in conjunction with Reino Bellis, NP-C. All aspects of care have been considered and discussed. The patient has been personally interviewed, examined, and all clinical data has been reviewed.   Non-ST elevation anterior myocardial infarction on presentation likely the source of the patient's complaint of weakness at that time.  Echo findings demonstrate mid to apical anterior wall hypokinesis.  EKG is also compatible with anteroseptal Q waves.  Medical therapy including low-dose beta-blocker and long-acting nitrates if tolerated by blood pressure.  Unable to use RAAS antagonism because of kidney failure.  High intensity statin therapy is recommended.  Acute kidney injury with dramatic progressive decline in function over the past 48 hours.  Coronary angiography will not be possible anytime soon.  Overall, the combination of acute coronary syndrome involving the LAD and concomitant acute kidney injury present a very poor prognosis.  We will continue to follow and help as possible.  Need to discuss long-term goals of care with the patient and family.   Progress Note  Patient Name: Chelsea [name] Date of Encounter: 05/15/2017  Primary Cardiologist: Mertie Moores, MD  Subjective   No complaints other then her foot hurting.   Inpatient Medications    Scheduled Meds: . amoxicillin-clavulanate  500 mg Oral Daily  . aspirin  81 mg Oral QPC breakfast  . atorvastatin  40 mg Oral q1800  . cholecalciferol  2,000 Units Oral Daily  . doxycycline  100 mg Oral BID  . insulin aspart  0-5 Units Subcutaneous QHS  . insulin aspart  0-9 Units Subcutaneous TID WC  . omega-3 acid ethyl esters  1,000 mg Oral Daily  . pantoprazole  40 mg Oral QPC breakfast   Continuous Infusions: . sodium chloride 75 mL/hr at 05/14/17 1732  . heparin 1,300 Units/hr (05/15/17 0833)   PRN Meds: acetaminophen, menthol-cetylpyridinium, nitroGLYCERIN, ondansetron (ZOFRAN) IV, phenol    Vital Signs    Vitals:   05/14/17 2029 05/15/17 0005 05/15/17 0358 05/15/17 0808  BP: (!) 96/54 101/66 108/61 119/70  Pulse: 82 86 93 86  Resp: 20 20 13  (!) 22  Temp: 98.6 F (37 C)  98.8 F (37.1 C) 98.3 F (36.8 C)  TempSrc: Oral Oral Oral Oral  SpO2: 94% 92% 91% 95%  Weight:      Height:        Intake/Output Summary (Last 24 hours) at 05/15/2017 0956 Last data filed at 05/15/2017 0900 Gross per 24 hour  Intake 1936.45 ml  Output 726 ml  Net 1210.45 ml   Filed Weights   05/14/17 0300  Weight: 190 lb 0.6 oz (86.2 kg)    Telemetry    SR - Personally Reviewed  ECG    SR - Personally Reviewed  Physical Exam   General: Well developed, well nourished, female appearing in no acute distress. Head: Normocephalic, atraumatic.  Neck: Supple without bruits, JVD. Lungs:  Resp regular and unlabored, CTA. Heart: RRR, S1, S2, no S3, S4, or murmur; no rub. Abdomen: Soft, non-tender, non-distended with normoactive bowel sounds.  Extremities: No clubbing, cyanosis, 2+ pitting edema to left LE and foot with erythema. Neuro: Alert and oriented X 3. Moves all extremities spontaneously. Psych: Normal affect.  Labs    Chemistry Recent Labs  Lab 05/14/17 0203 05/14/17 1008 05/15/17 0348  NA 133* 129* 132*  K 3.9 4.3 4.4  CL 99* 98* 101  CO2 21* 19* 20*  GLUCOSE 166* 256* 117*  BUN 26* 30* 38*  CREATININE 2.42* 3.07* 4.04*  CALCIUM 7.8* 7.5* 7.8*  GFRNONAA 17* 13* 9*  GFRAA 20* 15* 11*  ANIONGAP 13 12 11      Hematology Recent Labs  Lab 05/13/17 0944 05/14/17 0203 05/15/17 0348  WBC 11.9* 10.1 9.8  RBC 4.43 4.06 3.65*  HGB 12.1 11.1* 9.8*  HCT 37.5 34.9* 30.8*  MCV 84.7 86.0 84.4  MCH 27.3 27.3 26.8  MCHC 32.3 31.8 31.8  RDW 14.2 14.6 14.3  PLT 234 229 271    Cardiac Enzymes Recent Labs  Lab 05/13/17 0329 05/13/17 0718  TROPONINI 5.15* 5.87*    Recent Labs  Lab 05/13/17 0056  TROPIPOC 3.52*     BNP Recent Labs  Lab 05/14/17 0203  BNP  219.0*     DDimer No results for input(s): DDIMER in the last 168 hours.    Radiology    US Renal  Result Date: 05/14/2017 CLINICAL DATA:  Acute kidney failure EXAM: RENAL / URINARY TRACT ULTRASOUND COMPLETE COMPARISON:  None. FINDINGS: Right Kidney: Length: 12.2 cm. Increased cortical echogenicity. No hydronephrosis. Left Kidney: Length: 11.4 cm. Increased cortical echogenicity. No hydronephrosis. Small exophytic cyst measuring 1.8 cm. Bladder: Small echogenic debris within the bladder. IMPRESSION: 1. Increased cortical echogenicity consistent with medical renal disease. No hydronephrosis 2. Small amount of echogenic debris within the bladder 3. Small cyst in the left kidney Electronically Signed   By: Donavan Foil M.D.   On: 05/14/2017 18:37    Cardiac Studies   TTE: 05/14/17  Study Conclusions  - Left ventricle: The cavity size was normal. Wall thickness was   normal. Systolic function was mildly to moderately reduced. The   estimated ejection fraction was in the range of 40% to 45%.   Hypokinesis of the mid-apicalanteroseptal, anterior, and apical   myocardium; consistent with ischemia in the distribution of the   left anterior descending coronary artery. Doppler parameters are   consistent with abnormal left ventricular relaxation (grade 1   diastolic dysfunction). Doppler parameters are consistent with   elevated mean left atrial filling pressure. - Tricuspid valve: There was moderate regurgitation. - Pulmonary arteries: Systolic pressure was mildly increased. PA   peak pressure: 36 mm Hg (S).  Patient Profile     84 y.o. female with a hx of chronic osteomyelitis of the left foot,HTN,diabetes mellitus,dementiavenous stasis admitted to the hospital with sudden onset of generalized weakness. Cardiology asked to see in regards to her elevated troponin.   Assessment & Plan    1. NSTEMI: Troponin 5.87. EKG without ischemia. Initial plan was for cath but now on hold with Cr  continuing to rise. No chest pain. Echo with EF of 40-45% with hypokinesis in the mid-apicalanteroseptal, anterior and apical myocardium consistent with ischemia in the distribution of the LAD. Will plan for medical therapy at this time.   -- continue IV heparin, statin, and ASA  2. AKI: Cr 4.04 today. Suspect 2/2 to vanc/zosyn dosing. Consider nephrology consult.   3. Osteomyelitis of the left foot with venous statis: Given several doses of vanc/zosyn, now switched to doxy and augmentin.   4. Acute systolic HF: Echo noted EF of 40-45% with ischemia. BNP was elevated at 219, though will hold on diuretic give her AKI. Follow volume status closely.    Signed, Reino Bellis, NP  05/15/2017, 9:56 AM  Pager # [phone]   For questions or updates, please contact [place] Please consult [email or web address] for contact info under Cardiology/STEMI.

## 2017-05-15 NOTE — Consult Note (Signed)
            [facility] CM Primary Care Navigator  05/15/2017  Chelsea [name] 10/24/1928 [id number]  Went to see patientat the bedside to identify possible discharge needs. Patient states not being able to get up, out of bed and feeling weak- "without energy" that resulted to this admission.   Patientendorses Dr.James Maudie Mercury with [facility] as herprimary care provider.   [facility] [place] [facility].  Patient verbalized that she managesherown medications at homeusing"pill box" system.  Patient's daughter Chelsea [name]), son in-law Chelsea [name]) and grandson Chelsea [name]) have been providing transportation to herdoctors'appointments.  According to patient, she lives at her daughter's house but functions independently with self care. Her family members all work, but daughter provides assistance when needed.  Per Inpatient CM, discharge plan is stillundetermined pending PT/ OT evaluation and recommendation.   Patient voiced understanding to call primary care provider's office whenshe returns backhome, for a post discharge follow-up appointment within1- 2weeksor sooner if needs arise.Patient letter (with PCP's contact number) was provided at the bedside as her reminder.  Discussed withpatientabout [facility] CM services available forhealthmanagement at home but indicates having no needs or concerns for now and is able to manage herself so far. Per MD note, DM II is controlled with recent A1c of 6.8.   Explained and encouraged patientto seekreferral to [facility] from primary care provider if deemed necessary andappropriate for services in the future- once she returns back home.  [facility] care management information provided for future needs thatshemay have.    For additional questions please contact:  Edwena Felty A. Ethne Jeon, BSN, RN-BC [facility] PRIMARY CARE Navigator Cell: [phone]

## 2017-05-15 NOTE — Progress Notes (Signed)
[place] for IV heparin Indication: chest pain/ACS  Allergies  Allergen Reactions  . Travatan Z [Travoprost (Bak Free)] Other (See Comments)    dizziness   Patient Measurements: Height: 5\' 4"  (162.6 cm) Weight: 190 lb 0.6 oz (86.2 kg) IBW/kg (Calculated) : 54.7 Heparin Dosing Weight: 61 kg  Vital Signs: Temp: 98.3 F (36.8 C) (04/09 0808) Temp Source: Oral (04/09 0808) BP: 119/70 (04/09 0808) Pulse Rate: 86 (04/09 0808)  Labs: Recent Labs    05/13/17 0329 05/13/17 3825 05/13/17 0944  05/13/17 2303 05/14/17 0203 05/14/17 0813 05/14/17 1008 05/15/17 0348  HGB  --   --  12.1  --   --  11.1*  --   --  9.8*  HCT  --   --  37.5  --   --  34.9*  --   --  30.8*  PLT  --   --  234  --   --  229  --   --  271  HEPARINUNFRC  --   --   --    < > 0.11*  --  0.29*  --  0.31  CREATININE  --   --   --   --   --  2.42*  --  3.07* 4.04*  TROPONINI 5.15* 5.87*  --   --   --   --   --   --   --    < > = values in this interval not displayed.    Estimated Creatinine Clearance: 10.6 mL/min (A) (by C-G formula based on SCr of 4.04 mg/dL (H)).   Medical History: Past Medical History:  Diagnosis Date  . Anemia    takes Ferrous Sulfate daily  . Arthritis   . Basal cell carcinoma of breast 1998  . Bruises easily   . Dizziness   . DM (diabetes mellitus) ([place])    takes Metformin daily  . Eczema   . Emphysema lung ([place])   . GERD (gastroesophageal reflux disease)    takes Protonix daily  . Glaucoma    Bil  . History of hiatal hernia   . HTN (hypertension)    takes Diovan daily  . Hyperlipidemia   . ITP (idiopathic thrombocytopenic purpura) 2/09  . Joint pain   . Joint swelling   . Nocturia   . Osteoporosis   . Peripheral edema   . [place] 02/03/09   mid brain, diplopia    Medications:  Scheduled:  . amoxicillin-clavulanate  500 mg Oral BID  . aspirin  81 mg Oral QPC breakfast  . atorvastatin  40 mg Oral q1800  .  cholecalciferol  2,000 Units Oral Daily  . doxycycline  100 mg Oral BID  . insulin aspart  0-5 Units Subcutaneous QHS  . insulin aspart  0-9 Units Subcutaneous TID WC  . omega-3 acid ethyl esters  1,000 mg Oral Daily  . pantoprazole  40 mg Oral QPC breakfast   Infusions:  . sodium chloride 75 mL/hr at 05/14/17 1732  . heparin 1,250 Units/hr (05/15/17 0600)    Assessment: 77 yoF c/o shoulder pain and hypotension found to have NSTEMI. On heparin for ACS. Last heparin level therapeutic and up to 0.31. Hgb down to 9.8, plts wnl.  Goal of Therapy:  Heparin level 0.3-0.7 units/ml Monitor platelets by anticoagulation protocol: Yes   Plan:  Increase heparin gtt to 1,300 units/hr Monitor daily heparin level, CBC, s/s of bleed F/U rescheduling cath  [name], PharmD, BCPS Clinical Pharmacist  Pager [phone] 05/15/2017 8:18 AM

## 2017-05-15 NOTE — Progress Notes (Signed)
[place] TEAM 1 - [place]  GDJ:[id number] DOB: 30-Dec-1928 DOA: 05/12/2017 PCP: Jani Gravel, MD    Brief Narrative:  84y.o. F w/ a Hx of CVA, DM2 , HTN, Basal cell carcinoma of breast 1998, COPD, HLD, ITP, Hiatal hernia, and Chronic Osteomyelitis ofLfoot on chronic PO ABx who presented to the ED with c/o R shoulder pain. She called EMS after being unable to get out of bed.  Per EMS initial SBP was 70.  In the ED Trop was 3.5. EKG showed inferior ST depressions.  Significant Events: 4/06 admit 4/8 TTE - EF 40-45% - hypokinesis of mid-apical, anteroseptal, anterior, and apical myocardium c/w ishcemia of LAD - grade 1 DD   Subjective: The pt denies cp, sob, n/v.  She does c/o severe constipation.    Assessment & Plan:  NSTEMI  Elevated troponin - EKG and TTE c/w LAD disease - Cards following but unable to cath due to progressive kidney failure - prognosis poor at this time   Acute ischemic systolic CHF Due to AMI w/ LAD CAD - no evidence of gross volume overload at this time - may soon however need to stop IVF, and consider diuretic use   Nonoliguric Acute kidney failure   crt has been rapidly climbing - ?ATN due to hypotension in setting of LV infarct + use of ARB (prior to admit) - placed foley to assure bladder is fully and consistently decompressed and to allow accurate Is/Os - stopped Vanc - avoid potential nephrotoxins - renal US w/o acute findings/hydro - cont IVF for now to support BP   Recent Labs  Lab 05/13/17 0058 05/14/17 0203 05/14/17 1008 05/15/17 0348  CREATININE 0.80 2.42* 3.07* 4.04*    Altered mental status  CT head noted chronic moderate small vessel disease w/ atrophy - suspect there is a component of baseline dementia - check full metabolic w/u in AM   Chronic osteomyelitis L foot Change back to usual home oral abx regimen - followed by Dr. Megan Salon in [facility] - does not appear to be playing a role in her acute illness   DM  2 controlled with complication 6/22 W9N 6.8 - CBG reasonably controlled at this time - follow w/o change today   HTN Not an active problem at this time - was hypotensive time of presentation   COPD Well compensated  DVT prophylaxis: IV heparin  Code Status: FULL CODE Family Communication: no family present at time of exam  Disposition Plan: SDU   Consultants:  Cardiology   Antimicrobials:  Augmentin > Doxycycline >  Objective: Blood pressure 118/62, pulse 86, temperature 97.6 F (36.4 C), temperature source Oral, resp. rate 19, height 5\' 4"  (1.626 m), weight 86.2 kg (190 lb 0.6 oz), SpO2 95 %.  Intake/Output Summary (Last 24 hours) at 05/15/2017 1510 Last data filed at 05/15/2017 1300 Gross per 24 hour  Intake 1816.45 ml  Output 480 ml  Net 1336.45 ml   Filed Weights   05/14/17 0300  Weight: 86.2 kg (190 lb 0.6 oz)    Examination: General: No acute respiratory distress at rest in bed  Lungs: Clear to auscultation bilaterally - no crackles  Cardiovascular: JVD - RRR w/o M or rub  Abdomen: NT/ND, soft, bs+, no mass  Extremities: L LE w/ deformity at ankle - limited ability to move L foot - 2++ edema of L foot and up LE w/ erythema at ankle - 1+ R LE edema   CBC: Recent Labs  Lab 05/12/17 2326  05/13/17 0944 05/14/17 0203 05/15/17 0348  WBC 9.6  --  11.9* 10.1 9.8  NEUTROABS 8.1*  --  9.2*  --   --   HGB 16.3*   < > 12.1 11.1* 9.8*  HCT 49.5*   < > 37.5 34.9* 30.8*  MCV 85.3  --  84.7 86.0 84.4  PLT 214  --  234 229 271   < > = values in this interval not displayed.   Basic Metabolic Panel: Recent Labs  Lab 05/14/17 0203 05/14/17 1008 05/15/17 0348  NA 133* 129* 132*  K 3.9 4.3 4.4  CL 99* 98* 101  CO2 21* 19* 20*  GLUCOSE 166* 256* 117*  BUN 26* 30* 38*  CREATININE 2.42* 3.07* 4.04*  CALCIUM 7.8* 7.5* 7.8*  MG 1.8  --  1.8   GFR: Estimated Creatinine Clearance: 10.6 mL/min (A) (by C-G formula based on SCr of 4.04 mg/dL (H)).  Liver Function  Tests: No results for input(s): AST, ALT, ALKPHOS, BILITOT, PROT, ALBUMIN in the last 168 hours. No results for input(s): LIPASE, AMYLASE in the last 168 hours. No results for input(s): AMMONIA in the last 168 hours.   Cardiac Enzymes: Recent Labs  Lab 05/13/17 0329 05/13/17 0718  TROPONINI 5.15* 5.87*    HbA1C: Hgb A1c MFr Bld  Date/Time Value Ref Range Status  04/17/2017 11:46 AM 6.8 (H) <5.7 % of total Hgb Final    Comment:    For someone without known diabetes, a hemoglobin A1c value of 6.5% or greater indicates that they may have  diabetes and this should be confirmed with a follow-up  test. . For someone with known diabetes, a value <7% indicates  that their diabetes is well controlled and a value  greater than or equal to 7% indicates suboptimal  control. A1c targets should be individualized based on  duration of diabetes, age, comorbid conditions, and  other considerations. . Currently, no consensus exists regarding use of hemoglobin A1c for diagnosis of diabetes for children. Marland Kitchen   10/20/2014 02:17 PM 7.2 (H) 4.8 - 5.6 % Final    Comment:    (NOTE)         Pre-diabetes: 5.7 - 6.4         Diabetes: >6.4         Glycemic control for adults with diabetes: <7.0     CBG: Recent Labs  Lab 05/14/17 1620 05/14/17 2119 05/15/17 0609 05/15/17 1143 05/15/17 1458  GLUCAP 197* 119* 123* 158* 116*    Recent Results (from the past 240 hour(s))  Blood culture (routine x 2)     Status: None (Preliminary result)   Collection Time: 05/12/17 11:26 PM  Result Value Ref Range Status   Specimen Description   Final    RIGHT ANTECUBITAL Performed at [facility] [address] [place], [place] [address]    Special Requests   Final    BOTTLES DRAWN AEROBIC AND ANAEROBIC Blood Culture adequate volume Performed at [facility] [address], [place], [place] [address]    Culture   Final    NO GROWTH 2 DAYS Performed at [facility] [address] [place], [place] [address]    Report Status PENDING  Incomplete  Blood culture (routine x 2)     Status: None (Preliminary result)   Collection Time: 05/12/17 11:31 PM  Result Value Ref Range Status   Specimen Description   Final    BLOOD  LEFT HAND Performed at [facility], [place] [address], [place], [place] [address]    Special Requests   Final    BOTTLES DRAWN AEROBIC AND ANAEROBIC Blood Culture adequate volume Performed at [facility] [address] [place], [place] [address]    Culture   Final    NO GROWTH 2 DAYS Performed at [facility] [address], [place], [place] [address]    Report Status PENDING  Incomplete  MRSA PCR Screening     Status: None   Collection Time: 05/14/17  5:59 AM  Result Value Ref Range Status   MRSA by PCR NEGATIVE NEGATIVE Final    Comment:        The GeneXpert MRSA Assay (FDA approved for NASAL specimens only), is one component of a comprehensive MRSA colonization surveillance program. It is not intended to diagnose MRSA infection nor to guide or monitor treatment for MRSA infections. Performed at [facility], [place] [address], [place], [place] [address]      Scheduled Meds: . amoxicillin-clavulanate  500 mg Oral Daily  . aspirin  81 mg Oral QPC breakfast  . atorvastatin  40 mg Oral q1800  . cholecalciferol  2,000 Units Oral Daily  . doxycycline  100 mg Oral BID  . insulin aspart  0-5 Units Subcutaneous QHS  . insulin aspart  0-9 Units Subcutaneous TID WC  . omega-3 acid ethyl esters  1,000 mg Oral Daily  . pantoprazole  40 mg Oral QPC breakfast   Continuous Infusions: . sodium chloride 75 mL/hr at 05/14/17 1732  . heparin 1,300 Units/hr (05/15/17 1445)     LOS: 2 days   Cherene Altes, MD [facility] Office  [phone] Pager - Text Page per Shea Evans as per below:  On-Call/Text Page:      [email or web address]      password [email or web address]  If 7PM-7AM,  please contact night-coverage [email or web address] Password [email or web address] 05/15/2017, 3:10 PM

## 2017-05-16 ENCOUNTER — Inpatient Hospital Stay (HOSPITAL_COMMUNITY): Payer: Medicare Other

## 2017-05-16 DIAGNOSIS — L8990 Pressure ulcer of unspecified site, unspecified stage: Secondary | ICD-10-CM

## 2017-05-16 DIAGNOSIS — I5043 Acute on chronic combined systolic (congestive) and diastolic (congestive) heart failure: Secondary | ICD-10-CM

## 2017-05-16 DIAGNOSIS — N179 Acute kidney failure, unspecified: Secondary | ICD-10-CM

## 2017-05-16 LAB — CBC
HCT: 32.4 % — ABNORMAL LOW (ref 36.0–46.0)
HEMOGLOBIN: 10.8 g/dL — AB (ref 12.0–15.0)
MCH: 28.2 pg (ref 26.0–34.0)
MCHC: 33.3 g/dL (ref 30.0–36.0)
MCV: 84.6 fL (ref 78.0–100.0)
PLATELETS: 314 10*3/uL (ref 150–400)
RBC: 3.83 MIL/uL — AB (ref 3.87–5.11)
RDW: 14.4 % (ref 11.5–15.5)
WBC: 9.1 10*3/uL (ref 4.0–10.5)

## 2017-05-16 LAB — MAGNESIUM: MAGNESIUM: 1.9 mg/dL (ref 1.7–2.4)

## 2017-05-16 LAB — URINALYSIS, ROUTINE W REFLEX MICROSCOPIC
BILIRUBIN URINE: NEGATIVE
Glucose, UA: NEGATIVE mg/dL
KETONES UR: NEGATIVE mg/dL
NITRITE: NEGATIVE
PROTEIN: NEGATIVE mg/dL
Specific Gravity, Urine: 1.004 — ABNORMAL LOW (ref 1.005–1.030)
Squamous Epithelial / LPF: NONE SEEN
pH: 5 (ref 5.0–8.0)

## 2017-05-16 LAB — BASIC METABOLIC PANEL
ANION GAP: 13 (ref 5–15)
BUN: 47 mg/dL — AB (ref 6–20)
CHLORIDE: 102 mmol/L (ref 101–111)
CO2: 19 mmol/L — ABNORMAL LOW (ref 22–32)
Calcium: 7.8 mg/dL — ABNORMAL LOW (ref 8.9–10.3)
Creatinine, Ser: 4.93 mg/dL — ABNORMAL HIGH (ref 0.44–1.00)
GFR calc Af Amer: 8 mL/min — ABNORMAL LOW (ref >60)
GFR, EST NON AFRICAN AMERICAN: 7 mL/min — AB (ref >60)
GLUCOSE: 127 mg/dL — AB (ref 65–99)
POTASSIUM: 4.7 mmol/L (ref 3.5–5.1)
SODIUM: 134 mmol/L — AB (ref 135–145)

## 2017-05-16 LAB — GLUCOSE, CAPILLARY
GLUCOSE-CAPILLARY: 142 mg/dL — AB (ref 65–99)
Glucose-Capillary: 118 mg/dL — ABNORMAL HIGH (ref 65–99)
Glucose-Capillary: 176 mg/dL — ABNORMAL HIGH (ref 65–99)
Glucose-Capillary: 183 mg/dL — ABNORMAL HIGH (ref 65–99)

## 2017-05-16 LAB — HEPARIN LEVEL (UNFRACTIONATED): Heparin Unfractionated: 0.62 IU/mL (ref 0.30–0.70)

## 2017-05-16 LAB — VANCOMYCIN, RANDOM: Vancomycin Rm: 19

## 2017-05-16 LAB — SODIUM, URINE, RANDOM: Sodium, Ur: 25 mmol/L

## 2017-05-16 LAB — CREATININE, URINE, RANDOM: CREATININE, URINE: 28.94 mg/dL

## 2017-05-16 LAB — PHOSPHORUS: PHOSPHORUS: 5.1 mg/dL — AB (ref 2.5–4.6)

## 2017-05-16 MED ORDER — CARVEDILOL 3.125 MG PO TABS
3.1250 mg | ORAL_TABLET | Freq: Two times a day (BID) | ORAL | Status: DC
  Administered 2017-05-16 – 2017-05-29 (×27): 3.125 mg via ORAL
  Filled 2017-05-16 (×27): qty 1

## 2017-05-16 MED ORDER — METOPROLOL TARTRATE 12.5 MG HALF TABLET
12.5000 mg | ORAL_TABLET | Freq: Two times a day (BID) | ORAL | Status: DC
  Administered 2017-05-16 – 2017-05-17 (×3): 12.5 mg via ORAL
  Filled 2017-05-16 (×3): qty 1

## 2017-05-16 MED ORDER — NITROGLYCERIN 0.2 MG/HR TD PT24
0.2000 mg | MEDICATED_PATCH | Freq: Every day | TRANSDERMAL | Status: DC
  Administered 2017-05-16 – 2017-05-23 (×8): 0.2 mg via TRANSDERMAL
  Filled 2017-05-16 (×9): qty 1

## 2017-05-16 MED ORDER — FUROSEMIDE 10 MG/ML IJ SOLN
40.0000 mg | Freq: Once | INTRAMUSCULAR | Status: AC
  Administered 2017-05-16: 40 mg via INTRAVENOUS
  Filled 2017-05-16: qty 4

## 2017-05-16 NOTE — Progress Notes (Signed)
Chelsea [name] for IV heparin Indication: chest pain/ACS  Allergies  Allergen Reactions  . Travatan Z [Travoprost (Bak Free)] Other (See Comments)    dizziness   Patient Measurements: Height: 5\' 4"  (162.6 cm) Weight: 198 lb 13.7 oz (90.2 kg) IBW/kg (Calculated) : 54.7 Heparin Dosing Weight: 61 kg  Vital Signs: Temp: 98.5 F (36.9 C) (04/10 0451) Temp Source: Oral (04/10 0451) BP: 137/82 (04/10 0451) Pulse Rate: 95 (04/10 0451)  Labs: Recent Labs    05/14/17 0203 05/14/17 0813 05/14/17 1008 05/15/17 0348 05/16/17 0311  HGB 11.1*  --   --  9.8* 10.8*  HCT 34.9*  --   --  30.8* 32.4*  PLT 229  --   --  271 314  HEPARINUNFRC  --  0.29*  --  0.31 0.62  CREATININE 2.42*  --  3.07* 4.04* 4.93*    Estimated Creatinine Clearance: 8.9 mL/min (A) (by C-G formula based on SCr of 4.93 mg/dL (H)).   Medical History: Past Medical History:  Diagnosis Date  . Anemia    takes Ferrous Sulfate daily  . Arthritis   . Basal cell carcinoma of breast 1998  . Bruises easily   . Dizziness   . DM (diabetes mellitus) ([place])    takes Metformin daily  . Eczema   . Emphysema lung ([place])   . GERD (gastroesophageal reflux disease)    takes Protonix daily  . Glaucoma    Bil  . History of hiatal hernia   . HTN (hypertension)    takes Diovan daily  . Hyperlipidemia   . ITP (idiopathic thrombocytopenic purpura) 2/09  . Joint pain   . Joint swelling   . Nocturia   . Osteoporosis   . Peripheral edema   . Stroke ([place]) 02/03/09   mid brain, diplopia    Medications:  Scheduled:  . amoxicillin-clavulanate  500 mg Oral Daily  . aspirin  81 mg Oral QPC breakfast  . atorvastatin  40 mg Oral q1800  . cholecalciferol  2,000 Units Oral Daily  . doxycycline  100 mg Oral BID  . insulin aspart  0-5 Units Subcutaneous QHS  . insulin aspart  0-9 Units Subcutaneous TID WC  . omega-3 acid ethyl esters  1,000 mg Oral Daily  . pantoprazole  40 mg Oral QPC  breakfast  . senna-docusate  1 tablet Oral BID   Infusions:  . sodium chloride 60 mL/hr at 05/16/17 0445  . heparin 1,300 Units/hr (05/15/17 1445)    Assessment: [name] c/o shoulder pain and hypotension found to have NSTEMI. On heparin for ACS. Last heparin level therapeutic and up to 0.62. Plans noted for medical mangement.  Goal of Therapy:  Heparin level 0.3-0.7 units/ml Monitor platelets by anticoagulation protocol: Yes   Plan:  No heparin changes needed Daily heparin level and CBC Will follow plans for length of therapy  Hildred Laser, PharmD Clinical Pharmacist Clinical phone from 8:30-4:00 is [phone] After 4pm, please call Main [facility] [phone]) for assistance. 05/16/2017 8:25 AM

## 2017-05-16 NOTE — Progress Notes (Addendum)
The patient has been seen in conjunction with Reino Bellis, NP. All aspects of care have been considered and discussed. The patient has been personally interviewed, examined, and all clinical data has been reviewed.   Non-ST elevation ACS with significant troponin rise.  Likely has significant underlying CAD.  Medical management currently with high intensity statin therapy, beta-blocker therapy, and nitrates as tolerated by blood pressure.  Eventual ischemic evaluation if appropriate.  We will add low-dose therapy with blood pressure parameters added.  Progressive decline in kidney function occurring rapidly over the last 3 days with creatinine now greater than 4.5.  With nephrology consultation.  Acute on chronic combined systolic and diastolic heart failure.  Agree that potential development of pulmonary congestion may occur related to IV fluid.  Fluid administration should be monitored closely.  Prolonged discussion with the patient and daughter concerning goals of care.  She has no restrictions on the care that we provide.  We discussed CPR/life support/dialysis/percutaneous coronary intervention in some detail and she would proceed if we felt indicated.  Our limitations currently are due to progressive kidney failure. Prolonged visit with greater than 50% of time spent in counseling and establishing goals of care.  Progress Note  Patient Name: Chelsea [name] Date of Encounter: 05/16/2017  Primary Cardiologist: Mertie Moores, MD  Subjective   Sitting up in bed. No complaints other than her leg.   Inpatient Medications    Scheduled Meds: . amoxicillin-clavulanate  500 mg Oral Daily  . aspirin  81 mg Oral QPC breakfast  . atorvastatin  40 mg Oral q1800  . cholecalciferol  2,000 Units Oral Daily  . doxycycline  100 mg Oral BID  . insulin aspart  0-5 Units Subcutaneous QHS  . insulin aspart  0-9 Units Subcutaneous TID WC  . omega-3 acid ethyl esters  1,000 mg Oral Daily  .  pantoprazole  40 mg Oral QPC breakfast  . senna-docusate  1 tablet Oral BID   Continuous Infusions: . sodium chloride 60 mL/hr at 05/16/17 0445  . heparin 1,300 Units/hr (05/15/17 1445)   PRN Meds: acetaminophen, bisacodyl, menthol-cetylpyridinium, nitroGLYCERIN, ondansetron (ZOFRAN) IV, phenol, sorbitol   Vital Signs    Vitals:   05/15/17 2002 05/16/17 0101 05/16/17 0451 05/16/17 0520  BP: 119/75 134/71 137/82   Pulse: 98 92 95   Resp: 17 (!) 26 (!) 26   Temp: (!) 97.5 F (36.4 C) 97.7 F (36.5 C) 98.5 F (36.9 C)   TempSrc: Oral Oral Oral   SpO2: 94% 94% 92%   Weight:    198 lb 13.7 oz (90.2 kg)  Height:        Intake/Output Summary (Last 24 hours) at 05/16/2017 1010 Last data filed at 05/16/2017 0450 Gross per 24 hour  Intake 790 ml  Output 500 ml  Net 290 ml   Filed Weights   05/14/17 0300 05/16/17 0520  Weight: 190 lb 0.6 oz (86.2 kg) 198 lb 13.7 oz (90.2 kg)    Telemetry    SR - Personally Reviewed  ECG    N/a - Personally Reviewed  Physical Exam   General: Well developed, well nourished, older W female appearing in no acute distress. Head: Normocephalic, atraumatic.  Neck: Supple without bruits, mild JVD. Lungs:  Resp regular and unlabored, CTA. Heart: RRR, S1, S2, no S3, S4, or murmur; no rub. Abdomen: Soft, non-tender, non-distended with normoactive bowel sounds. No hepatomegaly. No rebound/guarding. No obvious abdominal masses. Extremities: No clubbing, cyanosis, 2+ pitting edema left LE with  arythema.  Neuro: Alert and oriented X 3. Moves all extremities spontaneously. Psych: Normal affect.  Labs    Chemistry Recent Labs  Lab 05/14/17 1008 05/15/17 0348 05/16/17 0311  NA 129* 132* 134*  K 4.3 4.4 4.7  CL 98* 101 102  CO2 19* 20* 19*  GLUCOSE 256* 117* 127*  BUN 30* 38* 47*  CREATININE 3.07* 4.04* 4.93*  CALCIUM 7.5* 7.8* 7.8*  GFRNONAA 13* 9* 7*  GFRAA 15* 11* 8*  ANIONGAP 12 11 13      Hematology Recent Labs  Lab  05/14/17 0203 05/15/17 0348 05/16/17 0311  WBC 10.1 9.8 9.1  RBC 4.06 3.65* 3.83*  HGB 11.1* 9.8* 10.8*  HCT 34.9* 30.8* 32.4*  MCV 86.0 84.4 84.6  MCH 27.3 26.8 28.2  MCHC 31.8 31.8 33.3  RDW 14.6 14.3 14.4  PLT 229 271 314    Cardiac Enzymes Recent Labs  Lab 05/13/17 0329 05/13/17 0718  TROPONINI 5.15* 5.87*    Recent Labs  Lab 05/13/17 0056  TROPIPOC 3.52*     BNP Recent Labs  Lab 05/14/17 0203  BNP 219.0*     DDimer No results for input(s): DDIMER in the last 168 hours.    Radiology    US Renal  Result Date: 05/14/2017 CLINICAL DATA:  Acute kidney failure EXAM: RENAL / URINARY TRACT ULTRASOUND COMPLETE COMPARISON:  None. FINDINGS: Right Kidney: Length: 12.2 cm. Increased cortical echogenicity. No hydronephrosis. Left Kidney: Length: 11.4 cm. Increased cortical echogenicity. No hydronephrosis. Small exophytic cyst measuring 1.8 cm. Bladder: Small echogenic debris within the bladder. IMPRESSION: 1. Increased cortical echogenicity consistent with medical renal disease. No hydronephrosis 2. Small amount of echogenic debris within the bladder 3. Small cyst in the left kidney Electronically Signed   By: Donavan Foil M.D.   On: 05/14/2017 18:37   Dg Chest Port 1 View  Result Date: 05/16/2017 CLINICAL DATA:  CHF, shortness of breath. EXAM: PORTABLE CHEST 1 VIEW COMPARISON:  Chest x-rays dated 05/12/2017 and 06/29/2006. FINDINGS: Cardiomegaly appears stable compared to the most recent chest x-ray of 05/12/2017. Overall cardiomediastinal silhouette appears stable. Atherosclerotic changes again noted at the aortic arch. Platelike opacity overlying the RIGHT hilum, atelectasis versus pneumonia. Lungs otherwise clear. No pleural effusion or pneumothorax seen. IMPRESSION: 1. RIGHT perihilar atelectasis versus pneumonia. Recommend follow-up chest x-ray to ensure resolution. 2. Stable cardiomegaly. 3. Aortic atherosclerosis. Electronically Signed   By: Franki Cabot M.D.   On:  05/16/2017 08:59    Cardiac Studies   TTE: 05/14/17  Study Conclusions  - Left ventricle: The cavity size was normal. Wall thickness was normal. Systolic function was mildly to moderately reduced. The estimated ejection fraction was in the range of 40% to 45%. Hypokinesis of the mid-apicalanteroseptal, anterior, and apical myocardium; consistent with ischemia in the distribution of the left anterior descending coronary artery. Doppler parameters are consistent with abnormal left ventricular relaxation (grade 1 diastolic dysfunction). Doppler parameters are consistent with elevated mean left atrial filling pressure. - Tricuspid valve: There was moderate regurgitation. - Pulmonary arteries: Systolic pressure was mildly increased. PA peak pressure: 36 mm Hg (S).  Patient Profile     84 y.o. female with a hx of chronic osteomyelitis of the left foot,HTN,diabetes mellitus,dementiavenous stasis admitted to the hospital with sudden onset of generalized weakness. Cardiology asked to see in regards to her elevated troponin.   Assessment & Plan    1. NSTEMI: Troponin up to 5.87. EKG without ischemia. Initial plan was for cath but now cancelled with Cr  continuing to rise. No chest pain. Echo with EF of 40-45% with hypokinesis in the mid-apicalanteroseptal, anterior and apical myocardium consistent with ischemia in the distribution of the LAD. Suspect she may have some degree of underlying dementia. Will plan for medical therapy at this time.  -- continue IV heparin, statin, and ASA. Will add low dose metoprolol today and follow BP/HR. Consider nitrates of tolerates BB.   2. AKI: Cr continues to rise at 4.93 today. Has been on IVF. Nephrology consulted by primary today.   3. Chronic Osteomyelitis of the left foot with venous statis: Given several doses of vanc/zosyn, now switched to doxy and augmentin.   4. Acute systolic HF: Echo noted EF of 40-45% with ischemia. BNP  was elevated at 219, though will hold on diuretic give her AKI. Follow volume status closely. Does seem slightly short of breath on exam today and some mild JVD noted. She is net + 2L. Will stop IVFs for now.    Signed, Reino Bellis, NP  05/16/2017, 10:10 AM  Pager # [phone]   For questions or updates, please contact [place] Please consult [email or web address] for contact info under Cardiology/STEMI.

## 2017-05-16 NOTE — Consult Note (Signed)
Reason for Consult: Acute kidney injury  Referring Physician: Jani Gravel MD [facility])   HPI:  84 year old Caucasian woman with past medical history significant for type 2 diabetes mellitus, hypertension, gastroesophageal reflux disease, history of basal cell carcinoma of the breast, history of ITP, history of CVA and what appears to be normal renal function at baseline (creatinine 0.7-0.8) who was admitted 3 days ago with profound weakness, shoulder pain and low blood pressures.  Per EMS her initial systolic blood pressure was in the 70s and  following admission, transiently had blood pressure as low as 88/55.  She has recently been on antibiotics (doxycycline and Augmentin) for osteomyelitis of the left foot with ongoing wound care.  Since admission, she has been noted to have acute coronary syndrome-non-ST elevation MI with significant troponin rise as high as 5.9 with echocardiogram showing EF of 40-45% with LAD distribution hypokinesis.  Currently undertaking medical management as her creatinine has risen to 4.9.  She was initially on vancomycin and Zosyn that has since been transitioned over to doxycycline/Augmentin.  She denies any prior history of acute kidney injury/chronic kidney disease, denies history of recurrent urinary tract infections and had a single episode of a kidney stone about 20 years ago that required "breaking up".  She denies any nausea, vomiting or diarrhea prior to admission and denies the use of nonsteroidal anti-inflammatory drugs.  She denies any hematuria and reports intermittent orthostatic dizziness.  Was on losartan prior to admission that was held because of hypotension.  Past Medical History:  Diagnosis Date  . Anemia    takes Ferrous Sulfate daily  . Arthritis   . Basal cell carcinoma of breast 1998  . Bruises easily   . Dizziness   . DM (diabetes mellitus) ([place])    takes Metformin daily  . Eczema   . Emphysema lung ([place])   . GERD (gastroesophageal reflux  disease)    takes Protonix daily  . Glaucoma    Bil  . History of hiatal hernia   . HTN (hypertension)    takes Diovan daily  . Hyperlipidemia   . ITP (idiopathic thrombocytopenic purpura) 2/09  . Joint pain   . Joint swelling   . Nocturia   . Osteoporosis   . Peripheral edema   . Stroke ([place]) 02/03/09   mid brain, diplopia    Past Surgical History:  Procedure Laterality Date  . BREAST LUMPECTOMY WITH RADIOACTIVE SEED LOCALIZATION Right 10/23/2014   Procedure: RIGHT BREAST LUMPECTOMY WITH RADIOACTIVE SEED LOCALIZATION;  Surgeon: Fanny Skates, MD;  Location: [facility];  Service: General;  Laterality: Right;  . CATARACT EXTRACTION Bilateral   . COLONOSCOPY    . ESOPHAGOGASTRODUODENOSCOPY    . MYOMECTOMY N/A 03/14/1995   late 90's per pt  . PARATHYROIDECTOMY  2008  . REFRACTIVE SURGERY Bilateral   . TONSILLECTOMY  as a child    Family History  Problem Relation Age of Onset  . Breast cancer Mother 71  . Colon cancer Father   . Diabetes Father        questionable  . Multiple sclerosis Daughter   . Heart disease Maternal Grandfather     Social History:  reports that she has never smoked. She has never used smokeless tobacco. She reports that she does not drink alcohol or use drugs.  Allergies:  Allergies  Allergen Reactions  . Travatan Z [Travoprost (Bak Free)] Other (See Comments)    dizziness    Medications:  Scheduled: . amoxicillin-clavulanate  500 mg Oral Daily  .  aspirin  81 mg Oral QPC breakfast  . atorvastatin  40 mg Oral q1800  . carvedilol  3.125 mg Oral BID WC  . cholecalciferol  2,000 Units Oral Daily  . doxycycline  100 mg Oral BID  . insulin aspart  0-5 Units Subcutaneous QHS  . insulin aspart  0-9 Units Subcutaneous TID WC  . metoprolol tartrate  12.5 mg Oral BID  . nitroGLYCERIN  0.2 mg Transdermal Daily  . omega-3 acid ethyl esters  1,000 mg Oral Daily  . pantoprazole  40 mg Oral QPC breakfast  . senna-docusate  1 tablet Oral BID    BMP Latest  Ref Rng & Units 05/16/2017 05/15/2017 05/14/2017  Glucose 65 - 99 mg/dL 127(H) 117(H) 256(H)  BUN 6 - 20 mg/dL 47(H) 38(H) 30(H)  Creatinine 0.44 - 1.00 mg/dL 4.93(H) 4.04(H) 3.07(H)  BUN/Creat Ratio 6 - 22 (calc) - - -  Sodium 135 - 145 mmol/L 134(L) 132(L) 129(L)  Potassium 3.5 - 5.1 mmol/L 4.7 4.4 4.3  Chloride 101 - 111 mmol/L 102 101 98(L)  CO2 22 - 32 mmol/L 19(L) 20(L) 19(L)  Calcium 8.9 - 10.3 mg/dL 7.8(L) 7.8(L) 7.5(L)   CBC Latest Ref Rng & Units 05/16/2017 05/15/2017 05/14/2017  WBC 4.0 - 10.5 K/uL 9.1 9.8 10.1  Hemoglobin 12.0 - 15.0 g/dL 10.8(L) 9.8(L) 11.1(L)  Hematocrit 36.0 - 46.0 % 32.4(L) 30.8(L) 34.9(L)  Platelets 150 - 400 K/uL 314 271 229     US Renal  Result Date: 05/14/2017 CLINICAL DATA:  Acute kidney failure EXAM: RENAL / URINARY TRACT ULTRASOUND COMPLETE COMPARISON:  None. FINDINGS: Right Kidney: Length: 12.2 cm. Increased cortical echogenicity. No hydronephrosis. Left Kidney: Length: 11.4 cm. Increased cortical echogenicity. No hydronephrosis. Small exophytic cyst measuring 1.8 cm. Bladder: Small echogenic debris within the bladder. IMPRESSION: 1. Increased cortical echogenicity consistent with medical renal disease. No hydronephrosis 2. Small amount of echogenic debris within the bladder 3. Small cyst in the left kidney Electronically Signed   By: Donavan Foil M.D.   On: 05/14/2017 18:37   Dg Chest Port 1 View  Result Date: 05/16/2017 CLINICAL DATA:  CHF, shortness of breath. EXAM: PORTABLE CHEST 1 VIEW COMPARISON:  Chest x-rays dated 05/12/2017 and 06/29/2006. FINDINGS: Cardiomegaly appears stable compared to the most recent chest x-ray of 05/12/2017. Overall cardiomediastinal silhouette appears stable. Atherosclerotic changes again noted at the aortic arch. Platelike opacity overlying the RIGHT hilum, atelectasis versus pneumonia. Lungs otherwise clear. No pleural effusion or pneumothorax seen. IMPRESSION: 1. RIGHT perihilar atelectasis versus pneumonia. Recommend  follow-up chest x-ray to ensure resolution. 2. Stable cardiomegaly. 3. Aortic atherosclerosis. Electronically Signed   By: Franki Cabot M.D.   On: 05/16/2017 08:59    Review of Systems  Constitutional: Positive for chills and malaise/fatigue. Negative for fever and weight loss.  HENT: Negative.   Eyes: Negative.   Respiratory: Positive for shortness of breath. Negative for cough and sputum production.   Cardiovascular: Positive for leg swelling. Negative for chest pain, palpitations and orthopnea.  Gastrointestinal: Negative.   Genitourinary: Negative.   Musculoskeletal: Positive for back pain.  Skin: Negative.   Neurological: Positive for dizziness and weakness. Negative for focal weakness.  Endo/Heme/Allergies: Negative.    Blood pressure 137/82, pulse 95, temperature 98.5 F (36.9 C), temperature source Oral, resp. rate (!) 26, height 5\' 4"  (1.626 m), weight 90.2 kg (198 lb 13.7 oz), SpO2 92 %. Physical Exam  Nursing note and vitals reviewed. Constitutional: She is oriented to person, place, and time. She appears well-developed and  well-nourished. No distress.  HENT:  Head: Normocephalic and atraumatic.  Mouth/Throat: Oropharynx is clear and moist.  Eyes: Pupils are equal, round, and reactive to light. Conjunctivae and EOM are normal. No scleral icterus.  Neck: Normal range of motion. Neck supple. JVD present.  8cm JVP  Cardiovascular: Normal rate, regular rhythm and normal heart sounds.  No murmur heard. Respiratory: Effort normal. She has rales.  Fine rales right base  GI: Soft. Bowel sounds are normal. There is no tenderness. There is no rebound and no guarding.  Musculoskeletal: She exhibits edema.  2+ left leg edema with ecchymosis around the ankle.  Trace-1+ right leg edema  Lymphadenopathy:    She has no cervical adenopathy.  Neurological: She is alert and oriented to person, place, and time.  Skin: Skin is warm and dry.  Psychiatric: She has a normal mood and affect.     Assessment/Plan: 1.  Acute kidney injury: This appears to be most likely ATN based on her presentation with relative hypotension as well as exposure to vancomycin/Zosyn.  Her urinalysis was concerning with some hematuria and this will be repeated again along with a quantification of urine protein/creatinine ratio as well as urine electrolytes.  She does have some volume overload on physical exam and at this time, will try bolus dose of furosemide to try and augment her urine output-she is currently nonoliguric.  Renal ultrasound does not show any obstruction.  I discussed with the patient as well as her daughter my suspected mechanism of her injury and the proposed plan for management.  Continue to avoid iodinated intravenous contrast, avoid nonsteroidal anti-inflammatory drugs and support blood pressures as you are currently doing while holding her ARB.  No acute indications for dialysis at this time. 2.  Acute coronary syndrome/non-ST elevation MI: Ongoing medical management with low-dose beta-blocker/heparin drip, continue to avoid hypotension and will attempt to augment diuresis. 3.  Osteomyelitis left foot: Transitioned over to oral doxycycline and Augmentin.  Afebrile and currently with acceptable blood pressures. 4.  Acute exacerbation of systolic/diastolic congestive heart failure: She appears to have acceptable intravascular volume status at this time and will check urine electrolytes.  Attempt diuretics for alleviation of third spacing.  Jonathon Tan K. 05/16/2017, 12:00 PM

## 2017-05-16 NOTE — Progress Notes (Signed)
Patient ID: KATELEEN ENCARNACION, female   DOB: 1928/09/18, 84 y.o.   MRN: [id number]                                                                PROGRESS NOTE                                                                                                                                                                                                             Patient Demographics:    Danyele Smejkal, is a 84 y.o. female, DOB - 1928-03-17, QQI:[id number]  Admit date - 05/12/2017   Admitting Physician Etta Quill, DO  Outpatient Primary MD for the patient is Jani Gravel, MD  LOS - 3  Outpatient Specialists:    Chief Complaint  Patient presents with  . Shoulder Pain  . Hypotension       Brief Narrative   84 y.o. female with medical history significant of DM, HTN, chronic osteomyelitis of L foot currently being treated with PO ABx.  Patient presents to the ED with c/o R shoulder pain.  She apparently called EMS after being unable to get out of bed.  To me she denies fall (different than EDP history).  To me she states that R shoulder pain has been ongoing for about 1 week.  L shoulder also started to hurt around Thurs.  She reports trouble getting out of chairs and out of bed recently.  (I suspect patient may have underlying dementia of some degree vs delirium.)  Per EMS initial SBP was 70.  ED Course: Trop is 3.5.  EKG shows inferior ST depressions.  Patient given zosyn / vanc for her foot ulcer by EDP.  Cultures ordered.  Cards consulted and hospitalist asked to admit.      Subjective:    Anastasija Anfinson today states that her left distal lower ext might be slightly more red.  Otherwise doing well.  Afebrile.    No headache, No chest pain, No abdominal pain - No Nausea, No new weakness tingling or numbness, No Cough - SOB.    Assessment  & Plan :    Principal Problem:   NSTEMI (non-ST elevated myocardial infarction) ([place]) Active Problems:   Diabetes mellitus ([place])  Chronic osteomyelitis of left foot (HCC)   HTN (hypertension)  Altered mental status (axox3, currently) CT head noted chronic moderate small vessel disease w/ atrophy - suspect there is a component of baseline dementia -  Resolved,  Pt is axox3  NSTEMI Cont iv heparin Cot aspirin  ARF Renal ultrasound 4/8=> negative for hydronephrosis Cont Ns iv Nephrology consult   Chronic osteomyelitis L foot Change back to usual home oral abx regimen - followed by Dr. Megan Salon in [facility] - does not appear to be playing a role in her acute illness   DM 2 controlled with complication 7/02 O3Z8.5 - CBG reasonably controlled at this time - follow w/o change today   HTN Not an active problem at this time - was hypotensive time of presentation   COPD Well compensated  DVT prophylaxis: IV heparin  Code Status: FULL CODE Family Communication: no family present at time of exam  Disposition Plan: SDU   Consultants:  Cardiology   Antimicrobials:  Augmentin > Doxycycline >  .      Lab Results  Component Value Date   PLT 314 05/16/2017     Anti-infectives (From admission, onward)   Start     Dose/Rate Route Frequency Ordered Stop   05/15/17 1800  amoxicillin-clavulanate (AUGMENTIN) 500-125 MG per tablet 500 mg     500 mg Oral Daily 05/15/17 0822     05/14/17 2200  doxycycline (VIBRA-TABS) tablet 100 mg     100 mg Oral 2 times daily 05/14/17 1703     05/14/17 2000  amoxicillin-clavulanate (AUGMENTIN) 500-125 MG per tablet 500 mg  Status:  Discontinued     500 mg Oral 2 times daily 05/14/17 1703 05/15/17 0822   05/14/17 1800  piperacillin-tazobactam (ZOSYN) IVPB 3.375 g  Status:  Discontinued     3.375 g 12.5 mL/hr over 240 Minutes Intravenous Every 12 hours 05/14/17 0853 05/14/17 1704   05/13/17 1600  vancomycin (VANCOCIN) IVPB 1000 mg/200 mL premix  Status:  Discontinued     1,000 mg 200 mL/hr over 60 Minutes Intravenous Every 36 hours 05/13/17 0231 05/13/17 1237     05/13/17 1330  vancomycin (VANCOCIN) IVPB 750 mg/150 ml premix  Status:  Discontinued     750 mg 150 mL/hr over 60 Minutes Intravenous Every 12 hours 05/13/17 1237 05/14/17 0853   05/13/17 0600  piperacillin-tazobactam (ZOSYN) IVPB 3.375 g  Status:  Discontinued     3.375 g 12.5 mL/hr over 240 Minutes Intravenous Every 8 hours 05/13/17 0231 05/14/17 0853   05/12/17 2330  vancomycin (VANCOCIN) IVPB 1000 mg/200 mL premix     1,000 mg 200 mL/hr over 60 Minutes Intravenous  Once 05/12/17 2326 05/13/17 0135   05/12/17 2330  piperacillin-tazobactam (ZOSYN) IVPB 3.375 g     3.375 g 100 mL/hr over 30 Minutes Intravenous  Once 05/12/17 2326 05/13/17 0134        Objective:   Vitals:   05/15/17 2002 05/16/17 0101 05/16/17 0451 05/16/17 0520  BP: 119/75 134/71 137/82   Pulse: 98 92 95   Resp: 17 (!) 26 (!) 26   Temp: (!) 97.5 F (36.4 C) 97.7 F (36.5 C) 98.5 F (36.9 C)   TempSrc: Oral Oral Oral   SpO2: 94% 94% 92%   Weight:    90.2 kg (198 lb 13.7 oz)  Height:        Wt Readings from Last 3 Encounters:  05/16/17 90.2 kg (198 lb 13.7 oz)  05/10/17 86.2 kg (190 lb)  04/19/17 86.6 kg (191 lb)     Intake/Output Summary (Last  24 hours) at 05/16/2017 0709 Last data filed at 05/16/2017 0450 Gross per 24 hour  Intake 910 ml  Output 500 ml  Net 410 ml     Physical Exam  Awake Alert, Oriented X 3, No new F.N deficits, Normal affect [place].AT,PERRAL Supple Neck,No JVD, No cervical lymphadenopathy appriciated.  Symmetrical Chest wall movement, Good air movement bilaterally, CTAB RRR,No Gallops,Rubs or new Murmurs, No Parasternal Heave +ve B.Sounds, Abd Soft, No tenderness, No organomegaly appriciated, No rebound - guarding or rigidity. No Cyanosis, Clubbing, 1+ pedal edema Redness, bruising mostly on the medial aspect of the left distal lower ext around her ankle   Foley in place, yellow clear urine    Data Review:    CBC Recent Labs  Lab 05/12/17 2326 05/13/17 0058  05/13/17 0944 05/14/17 0203 05/15/17 0348 05/16/17 0311  WBC 9.6  --  11.9* 10.1 9.8 9.1  HGB 16.3* 20.4* 12.1 11.1* 9.8* 10.8*  HCT 49.5* 60.0* 37.5 34.9* 30.8* 32.4*  PLT 214  --  234 229 271 314  MCV 85.3  --  84.7 86.0 84.4 84.6  MCH 28.1  --  27.3 27.3 26.8 28.2  MCHC 32.9  --  32.3 31.8 31.8 33.3  RDW 14.1  --  14.2 14.6 14.3 14.4  LYMPHSABS 0.8  --  1.2  --   --   --   MONOABS 0.7  --  1.5*  --   --   --   EOSABS 0.0  --  0.0  --   --   --   BASOSABS 0.0  --  0.0  --   --   --     Chemistries  Recent Labs  Lab 05/13/17 0058 05/14/17 0203 05/14/17 1008 05/15/17 0348 05/16/17 0311  NA 135 133* 129* 132* 134*  K 4.3 3.9 4.3 4.4 4.7  CL 100* 99* 98* 101 102  CO2  --  21* 19* 20* 19*  GLUCOSE 283* 166* 256* 117* 127*  BUN 15 26* 30* 38* 47*  CREATININE 0.80 2.42* 3.07* 4.04* 4.93*  CALCIUM  --  7.8* 7.5* 7.8* 7.8*  MG  --  1.8  --  1.8 1.9   ------------------------------------------------------------------------------------------------------------------ No results for input(s): CHOL, HDL, LDLCALC, TRIG, CHOLHDL, LDLDIRECT in the last 72 hours.  Lab Results  Component Value Date   HGBA1C 6.8 (H) 04/17/2017   ------------------------------------------------------------------------------------------------------------------ No results for input(s): TSH, T4TOTAL, T3FREE, THYROIDAB in the last 72 hours.  Invalid input(s): FREET3 ------------------------------------------------------------------------------------------------------------------ No results for input(s): VITAMINB12, FOLATE, FERRITIN, TIBC, IRON, RETICCTPCT in the last 72 hours.  Coagulation profile No results for input(s): INR, PROTIME in the last 168 hours.  No results for input(s): DDIMER in the last 72 hours.  Cardiac Enzymes Recent Labs  Lab 05/13/17 0329 05/13/17 0718  TROPONINI 5.15* 5.87*    ------------------------------------------------------------------------------------------------------------------    Component Value Date/Time   BNP 219.0 (H) 05/14/2017 0203    Inpatient Medications  Scheduled Meds: . amoxicillin-clavulanate  500 mg Oral Daily  . aspirin  81 mg Oral QPC breakfast  . atorvastatin  40 mg Oral q1800  . cholecalciferol  2,000 Units Oral Daily  . doxycycline  100 mg Oral BID  . insulin aspart  0-5 Units Subcutaneous QHS  . insulin aspart  0-9 Units Subcutaneous TID WC  . omega-3 acid ethyl esters  1,000 mg Oral Daily  . pantoprazole  40 mg Oral QPC breakfast  . senna-docusate  1 tablet Oral BID   Continuous Infusions: . sodium chloride 60 mL/hr  at 05/16/17 0445  . heparin 1,300 Units/hr (05/15/17 1445)   PRN Meds:.acetaminophen, bisacodyl, menthol-cetylpyridinium, nitroGLYCERIN, ondansetron (ZOFRAN) IV, phenol, sorbitol  Micro Results Recent Results (from the past 240 hour(s))  Blood culture (routine x 2)     Status: None (Preliminary result)   Collection Time: 05/12/17 11:26 PM  Result Value Ref Range Status   Specimen Description   Final    RIGHT ANTECUBITAL Performed at [facility] [address], [place], [place] [address]    Special Requests   Final    BOTTLES DRAWN AEROBIC AND ANAEROBIC Blood Culture adequate volume Performed at [facility] [address] [place], [place] [address]    Culture   Final    NO GROWTH 2 DAYS Performed at [facility] [address], [place], [place] [address]    Report Status PENDING  Incomplete  Blood culture (routine x 2)     Status: None (Preliminary result)   Collection Time: 05/12/17 11:31 PM  Result Value Ref Range Status   Specimen Description   Final    BLOOD LEFT HAND Performed at [facility] [address] [place], [place] [address]    Special Requests   Final    BOTTLES DRAWN AEROBIC AND ANAEROBIC Blood Culture  adequate volume Performed at [facility] [address] [place], [place] [address]    Culture   Final    NO GROWTH 2 DAYS Performed at [facility] [address] [place], [place] [address]    Report Status PENDING  Incomplete  MRSA PCR Screening     Status: None   Collection Time: 05/14/17  5:59 AM  Result Value Ref Range Status   MRSA by PCR NEGATIVE NEGATIVE Final    Comment:        The GeneXpert MRSA Assay (FDA approved for NASAL specimens only), is one component of a comprehensive MRSA colonization surveillance program. It is not intended to diagnose MRSA infection nor to guide or monitor treatment for MRSA infections. Performed at [facility], [place] [address] [place], [place] [address]     Radiology Reports Dg Chest 2 View  Result Date: 05/13/2017 CLINICAL DATA:  84 y/o  F; right shoulder pain.  Hypotension. EXAM: CHEST - 2 VIEW COMPARISON:  01/09/2008 chest CT. FINDINGS: Stable enlarged cardiac silhouette given projection and technique. Calcific aortic atherosclerosis. Large hiatal hernia. Pulmonary vascular congestion. No pleural effusion or pneumothorax. Bones are unremarkable. IMPRESSION: Large hiatal hernia. Pulmonary vascular congestion. Mild cardiomegaly. Aortic atherosclerosis. Electronically Signed   By: Kristine Garbe M.D.   On: 05/13/2017 00:13   Dg Shoulder Right  Result Date: 05/13/2017 CLINICAL DATA:  84 y/o  F; 2 weeks of right shoulder pain. EXAM: RIGHT SHOULDER - 2+ VIEW COMPARISON:  None. FINDINGS: There is no evidence of fracture or dislocation. Mild osteoarthrosis of the glenohumeral joint with small osteophytes at the inferior margin of humeral head. Soft tissues are unremarkable. IMPRESSION: 1.  No acute fracture or dislocation identified. 2. Mild osteoarthrosis of the glenohumeral joint. Electronically Signed   By: Kristine Garbe M.D.   On: 05/13/2017 00:16   Ct Head Wo Contrast  Result Date:  05/13/2017 CLINICAL DATA:  Hypotensive. History of stroke in 2010. Right shoulder pain. EXAM: CT HEAD WITHOUT CONTRAST CT CERVICAL SPINE WITHOUT CONTRAST TECHNIQUE: Multidetector CT imaging of the head and cervical spine was performed following the standard protocol without intravenous contrast. Multiplanar CT image reconstructions  of the cervical spine were also generated. COMPARISON:  CT and MRI exams of the brain from 02/03/2009 FINDINGS: CT HEAD FINDINGS Brain: Atrophy with chronic moderate small vessel ischemia. No large vascular territory infarct. No midline shift or edema. No intra-axial mass nor extra-axial fluid collections. Midline fourth ventricle and basal cisterns without effacement. Vascular: Atherosclerosis of the cavernous carotid arteries bilaterally. No hyperdense vessel sign. Skull: No skull fracture or suspicious osseous lesions. Sinuses/Orbits: Bilateral cataract extractions. Mild ethmoid sinus mucosal thickening. No acute sinus disease. No mastoid effusion. Other: None CT CERVICAL SPINE FINDINGS Alignment: Maintained cervical lordosis. Intact craniocervical relationship and atlantodental interval. No jumped or perched facets. Skull base and vertebrae: Intact skull base.  No vertebral fracture. Soft tissues and spinal canal: No prevertebral soft tissue swelling. No visible canal hematoma. Disc levels: Moderate to marked disc space narrowing at C5-6 and C6-7. Uncovertebral joint osteoarthritis noted at C5-6 and C6-7 bilaterally contributing to mild left C5-6 and mild right C6-7 neural foraminal encroachment. Multilevel degenerative facet arthropathy is visualized most prominent with subcortical cystic change at C3-4 on the left. Upper chest: Negative Other: None IMPRESSION: 1. Atrophy with chronic moderate small vessel ischemia. No acute intracranial abnormality. 2. No acute cervical spine fracture or listhesis. 3. Degenerative disc disease C5-6 and C6-7 with uncovertebral joint osteoarthritis and  uncinate spurring contributing to mild left C5-6 and right C6-7 neural foraminal encroachment. Multilevel degenerative facet arthropathy noted. Electronically Signed   By: Ashley Royalty M.D.   On: 05/13/2017 01:40   Ct Cervical Spine Wo Contrast  Result Date: 05/13/2017 CLINICAL DATA:  Hypotensive. History of stroke in 2010. Right shoulder pain. EXAM: CT HEAD WITHOUT CONTRAST CT CERVICAL SPINE WITHOUT CONTRAST TECHNIQUE: Multidetector CT imaging of the head and cervical spine was performed following the standard protocol without intravenous contrast. Multiplanar CT image reconstructions of the cervical spine were also generated. COMPARISON:  CT and MRI exams of the brain from 02/03/2009 FINDINGS: CT HEAD FINDINGS Brain: Atrophy with chronic moderate small vessel ischemia. No large vascular territory infarct. No midline shift or edema. No intra-axial mass nor extra-axial fluid collections. Midline fourth ventricle and basal cisterns without effacement. Vascular: Atherosclerosis of the cavernous carotid arteries bilaterally. No hyperdense vessel sign. Skull: No skull fracture or suspicious osseous lesions. Sinuses/Orbits: Bilateral cataract extractions. Mild ethmoid sinus mucosal thickening. No acute sinus disease. No mastoid effusion. Other: None CT CERVICAL SPINE FINDINGS Alignment: Maintained cervical lordosis. Intact craniocervical relationship and atlantodental interval. No jumped or perched facets. Skull base and vertebrae: Intact skull base.  No vertebral fracture. Soft tissues and spinal canal: No prevertebral soft tissue swelling. No visible canal hematoma. Disc levels: Moderate to marked disc space narrowing at C5-6 and C6-7. Uncovertebral joint osteoarthritis noted at C5-6 and C6-7 bilaterally contributing to mild left C5-6 and mild right C6-7 neural foraminal encroachment. Multilevel degenerative facet arthropathy is visualized most prominent with subcortical cystic change at C3-4 on the left. Upper  chest: Negative Other: None IMPRESSION: 1. Atrophy with chronic moderate small vessel ischemia. No acute intracranial abnormality. 2. No acute cervical spine fracture or listhesis. 3. Degenerative disc disease C5-6 and C6-7 with uncovertebral joint osteoarthritis and uncinate spurring contributing to mild left C5-6 and right C6-7 neural foraminal encroachment. Multilevel degenerative facet arthropathy noted. Electronically Signed   By: Ashley Royalty M.D.   On: 05/13/2017 01:40   US Renal  Result Date: 05/14/2017 CLINICAL DATA:  Acute kidney failure EXAM: RENAL / URINARY TRACT ULTRASOUND COMPLETE COMPARISON:  None. FINDINGS: Right Kidney: Length:  12.2 cm. Increased cortical echogenicity. No hydronephrosis. Left Kidney: Length: 11.4 cm. Increased cortical echogenicity. No hydronephrosis. Small exophytic cyst measuring 1.8 cm. Bladder: Small echogenic debris within the bladder. IMPRESSION: 1. Increased cortical echogenicity consistent with medical renal disease. No hydronephrosis 2. Small amount of echogenic debris within the bladder 3. Small cyst in the left kidney Electronically Signed   By: Donavan Foil M.D.   On: 05/14/2017 18:37   Dg Foot Complete Left  Result Date: 04/17/2017 Please see detailed radiograph report in office note.   Time Spent in minutes  30   Jani Gravel M.D on 05/16/2017 at 7:09 AM  Between 7am to 7pm - Pager - [phone]  After 7pm go to [email or web address] - password [facility]  [facility] -  Office  [phone]

## 2017-05-17 LAB — COMPREHENSIVE METABOLIC PANEL
ALK PHOS: 73 U/L (ref 38–126)
ALT: 127 U/L — ABNORMAL HIGH (ref 14–54)
ANION GAP: 11 (ref 5–15)
AST: 144 U/L — AB (ref 15–41)
Albumin: 1.9 g/dL — ABNORMAL LOW (ref 3.5–5.0)
BILIRUBIN TOTAL: 0.6 mg/dL (ref 0.3–1.2)
BUN: 57 mg/dL — AB (ref 6–20)
CO2: 18 mmol/L — AB (ref 22–32)
Calcium: 7.8 mg/dL — ABNORMAL LOW (ref 8.9–10.3)
Chloride: 104 mmol/L (ref 101–111)
Creatinine, Ser: 5.78 mg/dL — ABNORMAL HIGH (ref 0.44–1.00)
GFR calc Af Amer: 7 mL/min — ABNORMAL LOW (ref >60)
GFR calc non Af Amer: 6 mL/min — ABNORMAL LOW (ref >60)
GLUCOSE: 148 mg/dL — AB (ref 65–99)
POTASSIUM: 5.2 mmol/L — AB (ref 3.5–5.1)
SODIUM: 133 mmol/L — AB (ref 135–145)
TOTAL PROTEIN: 5 g/dL — AB (ref 6.5–8.1)

## 2017-05-17 LAB — CBC
HEMATOCRIT: 29.3 % — AB (ref 36.0–46.0)
HEMOGLOBIN: 9.7 g/dL — AB (ref 12.0–15.0)
MCH: 28 pg (ref 26.0–34.0)
MCHC: 33.1 g/dL (ref 30.0–36.0)
MCV: 84.4 fL (ref 78.0–100.0)
Platelets: 293 10*3/uL (ref 150–400)
RBC: 3.47 MIL/uL — AB (ref 3.87–5.11)
RDW: 14.9 % (ref 11.5–15.5)
WBC: 8.4 10*3/uL (ref 4.0–10.5)

## 2017-05-17 LAB — GLUCOSE, CAPILLARY
GLUCOSE-CAPILLARY: 159 mg/dL — AB (ref 65–99)
Glucose-Capillary: 133 mg/dL — ABNORMAL HIGH (ref 65–99)
Glucose-Capillary: 177 mg/dL — ABNORMAL HIGH (ref 65–99)
Glucose-Capillary: 134 mg/dL — ABNORMAL HIGH (ref 65–99)

## 2017-05-17 LAB — CK: Total CK: 3339 U/L — ABNORMAL HIGH (ref 38–234)

## 2017-05-17 LAB — MAGNESIUM: MAGNESIUM: 1.8 mg/dL (ref 1.7–2.4)

## 2017-05-17 LAB — UREA NITROGEN, URINE: UREA NITROGEN UR: 265 mg/dL

## 2017-05-17 LAB — HEPARIN LEVEL (UNFRACTIONATED): Heparin Unfractionated: 0.52 IU/mL (ref 0.30–0.70)

## 2017-05-17 MED ORDER — HYDROCODONE-ACETAMINOPHEN 5-325 MG PO TABS
1.0000 | ORAL_TABLET | Freq: Four times a day (QID) | ORAL | Status: DC | PRN
  Administered 2017-05-17 – 2017-05-27 (×6): 2 via ORAL
  Filled 2017-05-17 (×7): qty 2

## 2017-05-17 MED ORDER — HEPARIN SODIUM (PORCINE) 5000 UNIT/ML IJ SOLN
5000.0000 [IU] | Freq: Three times a day (TID) | INTRAMUSCULAR | Status: DC
  Administered 2017-05-17 – 2017-05-29 (×36): 5000 [IU] via SUBCUTANEOUS
  Filled 2017-05-17 (×35): qty 1

## 2017-05-17 MED ORDER — FUROSEMIDE 10 MG/ML IJ SOLN
40.0000 mg | Freq: Two times a day (BID) | INTRAMUSCULAR | Status: AC
  Administered 2017-05-17 (×2): 40 mg via INTRAVENOUS
  Filled 2017-05-17 (×2): qty 4

## 2017-05-17 NOTE — Progress Notes (Signed)
Patient ID: Chelsea [name], female   DOB: 1928/03/28, 84 y.o.   MRN: [id number] [facility] Progress Note   Assessment/ Plan:   1.  Acute kidney injury: This appears to be most likely ATN based on her presentation with relative hypotension as well as exposure to vancomycin/Zosyn-this was corroborated by her fraction excretion of sodium.  We will check CPK level today (reports of prolonged immobility while in bed).  With urine output improvement in response to furosemide but unfortunately continued decline of renal function-we will try and augment urine output some more as she remains with signs of volume overload.  High risk for needing dialysis if she continues to have worsening renal function and develops increasing uremic symptoms (fatigue/decreased appetite may be subtle uremic symptoms). 2.  Acute coronary syndrome/non-ST elevation MI: Ongoing medical management with low-dose beta-blocker/heparin drip, continue to avoid hypotension, will re-dose with furosemide. 3.  Osteomyelitis left foot: Transitioned over to oral doxycycline and Augmentin.  Without clear indicators of sepsis however intermittently hypothermic overnight. 4.  Acute exacerbation of systolic/diastolic congestive heart failure: She appears to have acceptable intravascular volume status at this time and will check urine electrolytes. Suspect that her significant hypoalbuminemia is contributing to third spacing/edema and possibly diuretic resistance.  Subjective:   Reports to be feeling poorly this morning "a sensation of being washed out".  Also reports poor appetite this morning but denies any significant worsening of shortness of breath.   Objective:   BP 136/78 (BP Location: Left Arm)   Pulse 91   Temp (!) 97.4 F (36.3 C) (Oral)   Resp (!) 24   Ht 5\' 4"  (1.626 m)   Wt 93.1 kg (205 lb 4 oz)   SpO2 93%   BMI 35.23 kg/m   Intake/Output Summary (Last 24 hours) at 05/17/2017 0951 Last data filed at 05/17/2017  2979 Gross per 24 hour  Intake 628 ml  Output 900 ml  Net -272 ml   Weight change: 2.9 kg (6 lb 6.3 oz)  Physical Exam: Gen: Appears to be uncomfortable resting in bed CVS: Pulse regular rhythm, normal rate, S1 and S2 normal Resp: Fine rales right base otherwise clear to auscultation, no rhonchi Abd: Soft, flat, nontender Ext: Left ankle ecchymosis noted, 1-2+ edema, right leg trace-1+ edema  Imaging: Dg Chest Port 1 View  Result Date: 05/16/2017 CLINICAL DATA:  CHF, shortness of breath. EXAM: PORTABLE CHEST 1 VIEW COMPARISON:  Chest x-rays dated 05/12/2017 and 06/29/2006. FINDINGS: Cardiomegaly appears stable compared to the most recent chest x-ray of 05/12/2017. Overall cardiomediastinal silhouette appears stable. Atherosclerotic changes again noted at the aortic arch. Platelike opacity overlying the RIGHT hilum, atelectasis versus pneumonia. Lungs otherwise clear. No pleural effusion or pneumothorax seen. IMPRESSION: 1. RIGHT perihilar atelectasis versus pneumonia. Recommend follow-up chest x-ray to ensure resolution. 2. Stable cardiomegaly. 3. Aortic atherosclerosis. Electronically Signed   By: Franki Cabot M.D.   On: 05/16/2017 08:59    Labs: BMET Recent Labs  Lab 05/13/17 0058 05/14/17 0203 05/14/17 1008 05/15/17 0348 05/16/17 0311 05/17/17 0313  NA 135 133* 129* 132* 134* 133*  K 4.3 3.9 4.3 4.4 4.7 5.2*  CL 100* 99* 98* 101 102 104  CO2  --  21* 19* 20* 19* 18*  GLUCOSE 283* 166* 256* 117* 127* 148*  BUN 15 26* 30* 38* 47* 57*  CREATININE 0.80 2.42* 3.07* 4.04* 4.93* 5.78*  CALCIUM  --  7.8* 7.5* 7.8* 7.8* 7.8*  PHOS  --   --   --   --  5.1*  --    CBC Recent Labs  Lab 05/12/17 2326  05/13/17 0944 05/14/17 0203 05/15/17 0348 05/16/17 0311 05/17/17 0313  WBC 9.6  --  11.9* 10.1 9.8 9.1 8.4  NEUTROABS 8.1*  --  9.2*  --   --   --   --   HGB 16.3*   < > 12.1 11.1* 9.8* 10.8* 9.7*  HCT 49.5*   < > 37.5 34.9* 30.8* 32.4* 29.3*  MCV 85.3  --  84.7 86.0 84.4  84.6 84.4  PLT 214  --  234 229 271 314 293   < > = values in this interval not displayed.    Medications:    . amoxicillin-clavulanate  500 mg Oral Daily  . aspirin  81 mg Oral QPC breakfast  . atorvastatin  40 mg Oral q1800  . carvedilol  3.125 mg Oral BID WC  . cholecalciferol  2,000 Units Oral Daily  . doxycycline  100 mg Oral BID  . insulin aspart  0-5 Units Subcutaneous QHS  . insulin aspart  0-9 Units Subcutaneous TID WC  . nitroGLYCERIN  0.2 mg Transdermal Daily  . omega-3 acid ethyl esters  1,000 mg Oral Daily  . pantoprazole  40 mg Oral QPC breakfast  . senna-docusate  1 tablet Oral BID   Elmarie Shiley, MD 05/17/2017, 9:51 AM

## 2017-05-17 NOTE — Progress Notes (Signed)
[facility] for IV heparin Indication: chest pain/ACS  Allergies  Allergen Reactions  . Travatan Z [Travoprost (Bak Free)] Other (See Comments)    dizziness   Patient Measurements: Height: 5\' 4"  (162.6 cm) Weight: 205 lb 4 oz (93.1 kg) IBW/kg (Calculated) : 54.7 Heparin Dosing Weight: 61 kg  Vital Signs: Temp: 98.7 F (37.1 C) (04/11 0357) Temp Source: Oral (04/11 0357) BP: 152/86 (04/11 0357) Pulse Rate: 86 (04/11 0450)  Labs: Recent Labs    05/15/17 0348 05/16/17 0311 05/17/17 0313  HGB 9.8* 10.8* 9.7*  HCT 30.8* 32.4* 29.3*  PLT 271 314 293  HEPARINUNFRC 0.31 0.62 0.52  CREATININE 4.04* 4.93* 5.78*    Estimated Creatinine Clearance: 7.7 mL/min (A) (by C-G formula based on SCr of 5.78 mg/dL (H)).   Medical History: Past Medical History:  Diagnosis Date  . Anemia    takes Ferrous Sulfate daily  . Arthritis   . Basal cell carcinoma of breast 1998  . Bruises easily   . Dizziness   . DM (diabetes mellitus) ([place])    takes Metformin daily  . Eczema   . Emphysema lung ([place])   . GERD (gastroesophageal reflux disease)    takes Protonix daily  . Glaucoma    Bil  . History of hiatal hernia   . HTN (hypertension)    takes Diovan daily  . Hyperlipidemia   . ITP (idiopathic thrombocytopenic purpura) 2/09  . Joint pain   . Joint swelling   . Nocturia   . Osteoporosis   . Peripheral edema   . Stroke ([place]) 02/03/09   mid brain, diplopia    Medications:  Scheduled:  . amoxicillin-clavulanate  500 mg Oral Daily  . aspirin  81 mg Oral QPC breakfast  . atorvastatin  40 mg Oral q1800  . carvedilol  3.125 mg Oral BID WC  . cholecalciferol  2,000 Units Oral Daily  . doxycycline  100 mg Oral BID  . insulin aspart  0-5 Units Subcutaneous QHS  . insulin aspart  0-9 Units Subcutaneous TID WC  . nitroGLYCERIN  0.2 mg Transdermal Daily  . omega-3 acid ethyl esters  1,000 mg Oral Daily  . pantoprazole  40 mg Oral QPC breakfast   . senna-docusate  1 tablet Oral BID   Infusions:  . heparin 1,300 Units/hr (05/17/17 3710)    Assessment: 29 yoF c/o shoulder pain and hypotension found to have NSTEMI. On heparin for ACS. Last heparin level therapeutic and up to 0.52. Plans noted for medical mangement.  Goal of Therapy:  Heparin level 0.3-0.7 units/ml Monitor platelets by anticoagulation protocol: Yes   Plan:  No heparin changes needed Daily heparin level and CBC Will follow plans for length of therapy  Hildred Laser, PharmD Clinical Pharmacist Clinical phone from 8:30-4:00 is [phone] After 4pm, please call Main Rx [phone]) for assistance. 05/17/2017 8:26 AM

## 2017-05-17 NOTE — Progress Notes (Addendum)
The patient has been seen in conjunction with Reino Bellis, NP. All aspects of care have been considered and discussed. The patient has been personally interviewed, examined, and all clinical data has been reviewed.   Continued worsening in kidney function.  At risk for volume overload however she does respond to furosemide.  On low-dose beta-blocker and long-acting nitrate.  Monitor for evidence of recurrent ischemia.  Heparin converted to subcu.  Progress Note  Patient Name: Chelsea [name] Date of Encounter: 05/17/2017  Primary Cardiologist: Mertie Moores, MD  Subjective   Doesn't feel well today. Feels "washed out" and tired. Breathing is slightly labored.   Inpatient Medications    Scheduled Meds: . amoxicillin-clavulanate  500 mg Oral Daily  . aspirin  81 mg Oral QPC breakfast  . atorvastatin  40 mg Oral q1800  . carvedilol  3.125 mg Oral BID WC  . cholecalciferol  2,000 Units Oral Daily  . doxycycline  100 mg Oral BID  . furosemide  40 mg Intravenous BID  . insulin aspart  0-5 Units Subcutaneous QHS  . insulin aspart  0-9 Units Subcutaneous TID WC  . nitroGLYCERIN  0.2 mg Transdermal Daily  . omega-3 acid ethyl esters  1,000 mg Oral Daily  . pantoprazole  40 mg Oral QPC breakfast  . senna-docusate  1 tablet Oral BID   Continuous Infusions: . heparin 1,300 Units/hr (05/17/17 0658)   PRN Meds: acetaminophen, bisacodyl, menthol-cetylpyridinium, nitroGLYCERIN, ondansetron (ZOFRAN) IV, phenol, sorbitol   Vital Signs    Vitals:   05/17/17 0041 05/17/17 0357 05/17/17 0450 05/17/17 0828  BP:  (!) 152/86  136/78  Pulse:  84 86 91  Resp:  18 20 (!) 24  Temp: (!) 97.1 F (36.2 C) 98.7 F (37.1 C)  (!) 97.4 F (36.3 C)  TempSrc:  Oral  Oral  SpO2:  96% 97% 93%  Weight:   205 lb 4 oz (93.1 kg)   Height:        Intake/Output Summary (Last 24 hours) at 05/17/2017 1038 Last data filed at 05/17/2017 0900 Gross per 24 hour  Intake 868 ml  Output 900 ml  Net -32  ml   Filed Weights   05/14/17 0300 05/16/17 0520 05/17/17 0450  Weight: 190 lb 0.6 oz (86.2 kg) 198 lb 13.7 oz (90.2 kg) 205 lb 4 oz (93.1 kg)    Telemetry    SR - Personally Reviewed  ECG    N/a - Personally Reviewed  Physical Exam   General: Obese older W female appearing in no acute distress. Head: Normocephalic, atraumatic.  Neck: Supple without bruits, JVD. Lungs:  Resp regular and unlabored, CTA. Heart: RRR, S1, S2, no S3, S4, or murmur; no rub. Abdomen: Soft, non-tender, non-distended with normoactive bowel sounds.  Extremities: No clubbing, cyanosis, LLE 2+ pitting edema and discoloration, RLE 1+ edema.  Neuro: Alert and oriented X 3. Moves all extremities spontaneously. Psych: Normal affect.  Labs    Chemistry Recent Labs  Lab 05/15/17 0348 05/16/17 0311 05/17/17 0313  NA 132* 134* 133*  K 4.4 4.7 5.2*  CL 101 102 104  CO2 20* 19* 18*  GLUCOSE 117* 127* 148*  BUN 38* 47* 57*  CREATININE 4.04* 4.93* 5.78*  CALCIUM 7.8* 7.8* 7.8*  PROT  --   --  5.0*  ALBUMIN  --   --  1.9*  AST  --   --  144*  ALT  --   --  127*  ALKPHOS  --   --  84  BILITOT  --   --  0.6  GFRNONAA 9* 7* 6*  GFRAA 11* 8* 7*  ANIONGAP 11 13 11      Hematology Recent Labs  Lab 05/15/17 0348 05/16/17 0311 05/17/17 0313  WBC 9.8 9.1 8.4  RBC 3.65* 3.83* 3.47*  HGB 9.8* 10.8* 9.7*  HCT 30.8* 32.4* 29.3*  MCV 84.4 84.6 84.4  MCH 26.8 28.2 28.0  MCHC 31.8 33.3 33.1  RDW 14.3 14.4 14.9  PLT 271 314 293    Cardiac Enzymes Recent Labs  Lab 05/13/17 0329 05/13/17 0718  TROPONINI 5.15* 5.87*    Recent Labs  Lab 05/13/17 0056  TROPIPOC 3.52*     BNP Recent Labs  Lab 05/14/17 0203  BNP 219.0*     DDimer No results for input(s): DDIMER in the last 168 hours.    Radiology    Dg Chest Port 1 View  Result Date: 05/16/2017 CLINICAL DATA:  CHF, shortness of breath. EXAM: PORTABLE CHEST 1 VIEW COMPARISON:  Chest x-rays dated 05/12/2017 and 06/29/2006. FINDINGS:  Cardiomegaly appears stable compared to the most recent chest x-ray of 05/12/2017. Overall cardiomediastinal silhouette appears stable. Atherosclerotic changes again noted at the aortic arch. Platelike opacity overlying the RIGHT hilum, atelectasis versus pneumonia. Lungs otherwise clear. No pleural effusion or pneumothorax seen. IMPRESSION: 1. RIGHT perihilar atelectasis versus pneumonia. Recommend follow-up chest x-ray to ensure resolution. 2. Stable cardiomegaly. 3. Aortic atherosclerosis. Electronically Signed   By: Franki Cabot M.D.   On: 05/16/2017 08:59    Cardiac Studies   TTE: 05/14/17  Study Conclusions  - Left ventricle: The cavity size was normal. Wall thickness was normal. Systolic function was mildly to moderately reduced. The estimated ejection fraction was in the range of 40% to 45%. Hypokinesis of the mid-apicalanteroseptal, anterior, and apical myocardium; consistent with ischemia in the distribution of the left anterior descending coronary artery. Doppler parameters are consistent with abnormal left ventricular relaxation (grade 1 diastolic dysfunction). Doppler parameters are consistent with elevated mean left atrial filling pressure. - Tricuspid valve: There was moderate regurgitation. - Pulmonary arteries: Systolic pressure was mildly increased. PA peak pressure: 36 mm Hg (S).   Patient Profile     84 y.o. female with a hx of chronic osteomyelitis of the left foot,HTN,diabetes mellitus,dementiavenous stasis admitted to the hospital with sudden onset of generalized weakness. Cardiologyasked to see in regards to her elevated troponin.  Assessment & Plan    1. NSTEMI:Troponin up to 5.87. EKG without ischemia. Initial plan was for cath but now cancelled with Cr continuing to rise. No chest pain. Echo with EF of 40-45% with hypokinesis in the mid-apicalanteroseptal, anterior and apical myocardium consistent with ischemia in the distribution of  the LAD. Will continue with medical therapy at this time. Switch to Sq Heparin, ASA and BB. Considered nitrates but want to avoid any hypotension that would worsen her renal function.  2. AKI: Cr continues to rise at 5.78 today. Nephrology consulted and concerned she may be heading towards HD.   3. Chronic Osteomyelitis of the left foot with venous statis:Given several doses of vanc/zosyn, now switched to doxy and augmentin. Edema seems to have worsened in this foot. Followed by Dr. Megan Salon.   4. Acute systolic AC:ZYSA noted EF of 40-45% with ischemia. BNP was elevated at 219. Stopped her IVFs yesterday. Nephrology following and dosed IV lasix with some UOP. Still volume overloaded on exam today. Will defer lasix dosing to renal.   5. Hyperkalemia: 5.2 -- BMET in am  6. Elevated LFTs: AST/ALT increased today. Will hold statin for now given this new finding.   Signed, Reino Bellis, NP  05/17/2017, 10:38 AM  Pager # [phone]   For questions or updates, please contact [place] Please consult [email or web address] for contact info under Cardiology/STEMI.

## 2017-05-17 NOTE — Progress Notes (Signed)
[facility] TEAM 1 - [place]  XBJ:[id number] DOB: [birth year] DOA: 05/12/2017 PCP: Jani Gravel, MD    Brief Narrative:  84y.o. F w/ a Hx of CVA, DM2 , HTN, Basal cell carcinoma of breast 1998, COPD, HLD, ITP, Hiatal hernia, and Chronic Osteomyelitis ofLfoot on chronic PO ABx who presented to the ED with c/o R shoulder pain. She called EMS after being unable to get out of bed.  Per EMS initial SBP was 70.  In the ED Trop was 3.5. EKG showed inferior ST depressions.  Significant Events: 4/06 admit 4/8 TTE - EF 40-45% - hypokinesis of mid-apical, anteroseptal, anterior, and apical myocardium c/w ishcemia of LAD - grade 1 DD   Subjective: Tells me she has felt "tired and icky all day."  Denies vomiting but admits to low grade nausea.  Denies sob, cp, or abdom pain.    Assessment & Plan:  NSTEMI  Elevated troponin - EKG and TTE c/w LAD disease - Cards following but unable to cath due to progressive kidney failure - prognosis poor at this time   Acute ischemic systolic CHF Due to AMI w/ LAD CAD - was now developed volume overload in setting of severe renal failure - diuretic per Nephrology   Nonoliguric Acute kidney failure   crt still rapidly climbing - ?ATN due to hypotension in setting of LV infarct + use of ARB (prior to admit) - placed foley to assure bladder is fully and consistently decompressed and to allow accurate Is/Os - stopped Vanc - avoid potential nephrotoxins - renal US w/o acute findings/hydro - Nephrology now following    Recent Labs  Lab 05/14/17 0203 05/14/17 1008 05/15/17 0348 05/16/17 0311 05/17/17 0313  CREATININE 2.42* 3.07* 4.04* 4.93* 5.78*    Altered mental status  CT head noted chronic moderate small vessel disease w/ atrophy - suspect there is a component of baseline dementia - likely now an element of uremia at play as well   Chronic osteomyelitis L foot Change back to usual home oral abx regimen - followed by Dr. Megan Salon in  [facility] - does not appear to be playing a role in her acute illness   DM 2 controlled with complication 3/08 M5H 6.8 - CBG reasonably controlled at this time - follow w/o change today   HTN Not an active problem at this time - was hypotensive at time of presentation   COPD Well compensated  DVT prophylaxis: SQ heparin  Code Status: FULL CODE Family Communication: no family present at time of exam  Disposition Plan: SDU   Consultants:  Cardiology   Antimicrobials:  Augmentin > Doxycycline >  Objective: Blood pressure 134/81, pulse 78, temperature 97.6 F (36.4 C), temperature source Oral, resp. rate (!) 27, height 5\' 4"  (1.626 m), weight 93.1 kg (205 lb 4 oz), SpO2 95 %.  Intake/Output Summary (Last 24 hours) at 05/17/2017 1718 Last data filed at 05/17/2017 1701 Gross per 24 hour  Intake 872 ml  Output 2600 ml  Net -1728 ml   Filed Weights   05/14/17 0300 05/16/17 0520 05/17/17 0450  Weight: 86.2 kg (190 lb 0.6 oz) 90.2 kg (198 lb 13.7 oz) 93.1 kg (205 lb 4 oz)    Examination: General: No acute respiratory distress at rest  Lungs: Clear to auscultation bilaterally but w/ poor air movement B bases  Cardiovascular: JVD - RRR w/o rub  Abdomen: NT/ND, soft, bs+, no mass  Extremities: L LE w/ deformity at ankle - limited  ability to move L foot - 2++ edema of L foot and up LE w/ erythema at ankle - 2+ R LE edema   CBC: Recent Labs  Lab 05/12/17 2326  05/13/17 0944  05/15/17 0348 05/16/17 0311 05/17/17 0313  WBC 9.6  --  11.9*   < > 9.8 9.1 8.4  NEUTROABS 8.1*  --  9.2*  --   --   --   --   HGB 16.3*   < > 12.1   < > 9.8* 10.8* 9.7*  HCT 49.5*   < > 37.5   < > 30.8* 32.4* 29.3*  MCV 85.3  --  84.7   < > 84.4 84.6 84.4  PLT 214  --  234   < > 271 314 293   < > = values in this interval not displayed.   Basic Metabolic Panel: Recent Labs  Lab 05/15/17 0348 05/16/17 0311 05/17/17 0313  NA 132* 134* 133*  K 4.4 4.7 5.2*  CL 101 102 104  CO2 20* 19* 18*    GLUCOSE 117* 127* 148*  BUN 38* 47* 57*  CREATININE 4.04* 4.93* 5.78*  CALCIUM 7.8* 7.8* 7.8*  MG 1.8 1.9 1.8  PHOS  --  5.1*  --    GFR: Estimated Creatinine Clearance: 7.7 mL/min (A) (by C-G formula based on SCr of 5.78 mg/dL (H)).  Liver Function Tests: Recent Labs  Lab 05/17/17 0313  AST 144*  ALT 127*  ALKPHOS 73  BILITOT 0.6  PROT 5.0*  ALBUMIN 1.9*     Cardiac Enzymes: Recent Labs  Lab 05/13/17 0329 05/13/17 0718 05/17/17 0313  CKTOTAL  --   --  3,339*  TROPONINI 5.15* 5.87*  --     HbA1C: Hgb A1c MFr Bld  Date/Time Value Ref Range Status  04/17/2017 11:46 AM 6.8 (H) <5.7 % of total Hgb Final    Comment:    For someone without known diabetes, a hemoglobin A1c value of 6.5% or greater indicates that they may have  diabetes and this should be confirmed with a follow-up  test. . For someone with known diabetes, a value <7% indicates  that their diabetes is well controlled and a value  greater than or equal to 7% indicates suboptimal  control. A1c targets should be individualized based on  duration of diabetes, age, comorbid conditions, and  other considerations. . Currently, no consensus exists regarding use of hemoglobin A1c for diagnosis of diabetes for children. Marland Kitchen   10/20/2014 02:17 PM 7.2 (H) 4.8 - 5.6 % Final    Comment:    (NOTE)         Pre-diabetes: 5.7 - 6.4         Diabetes: >6.4         Glycemic control for adults with diabetes: <7.0     CBG: Recent Labs  Lab 05/16/17 1652 05/16/17 2139 05/17/17 0607 05/17/17 1113 05/17/17 1651  GLUCAP 176* 183* 133* 177* 134*    Recent Results (from the past 240 hour(s))  Blood culture (routine x 2)     Status: None (Preliminary result)   Collection Time: 05/12/17 11:26 PM  Result Value Ref Range Status   Specimen Description   Final    RIGHT ANTECUBITAL Performed at [facility] [address] [place], [place] [address]    Special Requests   Final    BOTTLES  DRAWN AEROBIC AND ANAEROBIC Blood Culture adequate volume Performed at [facility] [address] [place],  [place] [address]    Culture   Final    NO GROWTH 4 DAYS Performed at [facility], [place] [address], [place], [place] [address]    Report Status PENDING  Incomplete  Blood culture (routine x 2)     Status: None (Preliminary result)   Collection Time: 05/12/17 11:31 PM  Result Value Ref Range Status   Specimen Description   Final    BLOOD LEFT HAND Performed at [facility] [address], [place], [place] [address]    Special Requests   Final    BOTTLES DRAWN AEROBIC AND ANAEROBIC Blood Culture adequate volume Performed at [facility] [address], [place], [place] [address]    Culture   Final    NO GROWTH 4 DAYS Performed at [facility], [place] [address], [place], [place] [address]    Report Status PENDING  Incomplete  MRSA PCR Screening     Status: None   Collection Time: 05/14/17  5:59 AM  Result Value Ref Range Status   MRSA by PCR NEGATIVE NEGATIVE Final    Comment:        The GeneXpert MRSA Assay (FDA approved for NASAL specimens only), is one component of a comprehensive MRSA colonization surveillance program. It is not intended to diagnose MRSA infection nor to guide or monitor treatment for MRSA infections. Performed at [facility], [place] [address] [place], [place] [address]      Scheduled Meds: . amoxicillin-clavulanate  500 mg Oral Daily  . aspirin  81 mg Oral QPC breakfast  . carvedilol  3.125 mg Oral BID WC  . cholecalciferol  2,000 Units Oral Daily  . doxycycline  100 mg Oral BID  . furosemide  40 mg Intravenous BID  . heparin injection (subcutaneous)  5,000 Units Subcutaneous Q8H  . insulin aspart  0-5 Units Subcutaneous QHS  . insulin aspart  0-9 Units Subcutaneous TID WC  . nitroGLYCERIN  0.2 mg Transdermal Daily  . omega-3 acid ethyl esters  1,000 mg Oral Daily   . pantoprazole  40 mg Oral QPC breakfast  . senna-docusate  1 tablet Oral BID      LOS: 4 days   Cherene Altes, MD [facility] Office  [phone] Pager - Text Page per Amion as per below:  On-Call/Text Page:      [email or web address]      password [email or web address]  If 7PM-7AM, please contact night-coverage [email or web address] Password [facility] 05/17/2017, 5:18 PM

## 2017-05-18 ENCOUNTER — Telehealth: Payer: Self-pay | Admitting: *Deleted

## 2017-05-18 LAB — CULTURE, BLOOD (ROUTINE X 2)
CULTURE: NO GROWTH
Culture: NO GROWTH
SPECIAL REQUESTS: ADEQUATE
Special Requests: ADEQUATE

## 2017-05-18 LAB — MAGNESIUM: MAGNESIUM: 1.7 mg/dL (ref 1.7–2.4)

## 2017-05-18 LAB — RENAL FUNCTION PANEL
ALBUMIN: 1.8 g/dL — AB (ref 3.5–5.0)
ANION GAP: 11 (ref 5–15)
BUN: 74 mg/dL — ABNORMAL HIGH (ref 6–20)
CALCIUM: 8.3 mg/dL — AB (ref 8.9–10.3)
CO2: 18 mmol/L — AB (ref 22–32)
CREATININE: 6.29 mg/dL — AB (ref 0.44–1.00)
Chloride: 103 mmol/L (ref 101–111)
GFR, EST AFRICAN AMERICAN: 6 mL/min — AB (ref >60)
GFR, EST NON AFRICAN AMERICAN: 5 mL/min — AB (ref >60)
Glucose, Bld: 136 mg/dL — ABNORMAL HIGH (ref 65–99)
PHOSPHORUS: 6.8 mg/dL — AB (ref 2.5–4.6)
Potassium: 5.6 mmol/L — ABNORMAL HIGH (ref 3.5–5.1)
SODIUM: 132 mmol/L — AB (ref 135–145)

## 2017-05-18 LAB — GLUCOSE, CAPILLARY
GLUCOSE-CAPILLARY: 119 mg/dL — AB (ref 65–99)
Glucose-Capillary: 196 mg/dL — ABNORMAL HIGH (ref 65–99)
Glucose-Capillary: 128 mg/dL — ABNORMAL HIGH (ref 65–99)
Glucose-Capillary: 149 mg/dL — ABNORMAL HIGH (ref 65–99)

## 2017-05-18 LAB — CBC
HCT: 29.8 % — ABNORMAL LOW (ref 36.0–46.0)
HEMOGLOBIN: 9.7 g/dL — AB (ref 12.0–15.0)
MCH: 27.6 pg (ref 26.0–34.0)
MCHC: 32.6 g/dL (ref 30.0–36.0)
MCV: 84.7 fL (ref 78.0–100.0)
PLATELETS: 293 10*3/uL (ref 150–400)
RBC: 3.52 MIL/uL — AB (ref 3.87–5.11)
RDW: 14.8 % (ref 11.5–15.5)
WBC: 9.3 10*3/uL (ref 4.0–10.5)

## 2017-05-18 MED ORDER — PATIROMER SORBITEX CALCIUM 8.4 G PO PACK
8.4000 g | PACK | Freq: Every day | ORAL | Status: DC
  Administered 2017-05-18: 8.4 g via ORAL
  Filled 2017-05-18 (×2): qty 4

## 2017-05-18 MED ORDER — FUROSEMIDE 10 MG/ML IJ SOLN
40.0000 mg | Freq: Two times a day (BID) | INTRAMUSCULAR | Status: AC
  Administered 2017-05-18 – 2017-05-19 (×3): 40 mg via INTRAVENOUS
  Filled 2017-05-18 (×4): qty 4

## 2017-05-18 NOTE — Telephone Encounter (Signed)
Spoke with [name]. She just wanted to be sure that Dr Megan Salon was aware that Raihana was at [facility] and that the physicians currently treating her were aware of her wound.  RN reassured [name] that I would let ID physicians know about her mother, encouraged her to speak with  her mother's hospital nurses about wound care and assessments. Landis Gandy, RN

## 2017-05-18 NOTE — Progress Notes (Signed)
CARDIOLOGY FOLLOW-up   Unfortunately, kidney function continues to worsen.  Mild hyperkalemia.  She would be at risk for volume overload and sudden pulmonary edema.  She is not currently a candidate for invasive therapies.  We have initiated low-dose anti-ischemic therapy.  We have nothing further to offer.  Please call if we can be of further assistance.

## 2017-05-18 NOTE — Progress Notes (Signed)
TEAM 1 - [place]  YCX:[id number] DOB: 04/13/1928 DOA: 05/12/2017 PCP: Jani Gravel, MD    Brief Narrative:  84y.o. F w/ a Hx of CVA, DM2 , HTN, Basal cell carcinoma of breast 1998, COPD, HLD, ITP, Hiatal hernia, and Chronic Osteomyelitis ofLfoot on chronic PO ABx who presented to the ED with c/o R shoulder pain. She called EMS after being unable to get out of bed.  Per EMS initial SBP was 70.  In the ED Trop was 3.5. EKG showed inferior ST depressions.  Significant Events: 4/06 admit 4/8 TTE - EF 40-45% - hypokinesis of mid-apical, anteroseptal, anterior, and apical myocardium c/w ishcemia of LAD - grade 1 DD   Subjective: Resting quietly in bed.  No resp distress or uncontrolled pain.    Assessment & Plan:  NSTEMI  Elevated troponin - EKG and TTE c/w LAD disease - Cards unable to cath due to progressive kidney failure - prognosis poor at this time - cont medical tx only for now   Acute ischemic systolic CHF Due to AMI w/ LAD CAD - was now developed volume overload in setting of severe renal failure - diuretic per Nephrology   Nonoliguric Acute kidney failure   crt still rapidly climbing - ?ATN due to hypotension in setting of LV infarct + use of ARB (prior to admit) - placed foley to assure bladder is fully and consistently decompressed and to allow accurate Is/Os - stopped Vanc - avoid potential nephrotoxins - renal US w/o acute findings/hydro - Nephrology now following - no signs of recovery thus far - will discuss goals of care w/ pt and family in next 24hrs - not sure if she would even be a HD candidate or not   Recent Labs  Lab 05/14/17 1008 05/15/17 0348 05/16/17 0311 05/17/17 0313 05/18/17 0419  CREATININE 3.07* 4.04* 4.93* 5.78* 6.29*    Altered mental status  CT head noted chronic moderate small vessel disease w/ atrophy - suspect there is a component of baseline dementia - likely now an element of uremia at play as well    Chronic osteomyelitis L foot Change back to usual home oral abx regimen - followed by Dr. Megan Salon in [facility] - does not appear to be playing a role in her acute illness   DM 2 controlled with complication 4/97 W2O 6.8 - CBG reasonably controlled at this time - follow w/o change today - watch for hypoglycemia in setting of worsening renal fxn   HTN Not an active problem at this time - was hypotensive at time of presentation   COPD Well compensated  DVT prophylaxis: SQ heparin  Code Status: FULL CODE Family Communication: no family present at time of exam  Disposition Plan: SDU   Consultants:  Cardiology   Antimicrobials:  Augmentin > Doxycycline >  Objective: Blood pressure 129/70, pulse 69, temperature 97.8 F (36.6 C), temperature source Oral, resp. rate 16, height 5\' 4"  (1.626 m), weight 92 kg (202 lb 14.4 oz), SpO2 95 %.  Intake/Output Summary (Last 24 hours) at 05/18/2017 1556 Last data filed at 05/18/2017 1300 Gross per 24 hour  Intake 440 ml  Output 2375 ml  Net -1935 ml   Filed Weights   05/16/17 0520 05/17/17 0450 05/18/17 0435  Weight: 90.2 kg (198 lb 13.7 oz) 93.1 kg (205 lb 4 oz) 92 kg (202 lb 14.4 oz)    Examination: General: No acute respiratory distress at rest  Lungs: poor air movement in  bases B - no wheeze  Cardiovascular: JVD - RRR  Extremities: L LE w/ deformity at ankle - limited ability to move L foot - 2++ edema B LE   CBC: Recent Labs  Lab 05/12/17 2326  05/13/17 0944  05/16/17 0311 05/17/17 0313 05/18/17 0419  WBC 9.6  --  11.9*   < > 9.1 8.4 9.3  NEUTROABS 8.1*  --  9.2*  --   --   --   --   HGB 16.3*   < > 12.1   < > 10.8* 9.7* 9.7*  HCT 49.5*   < > 37.5   < > 32.4* 29.3* 29.8*  MCV 85.3  --  84.7   < > 84.6 84.4 84.7  PLT 214  --  234   < > 314 293 293   < > = values in this interval not displayed.   Basic Metabolic Panel: Recent Labs  Lab 05/16/17 0311 05/17/17 0313 05/18/17 0419  NA 134* 133* 132*  K 4.7 5.2*  5.6*  CL 102 104 103  CO2 19* 18* 18*  GLUCOSE 127* 148* 136*  BUN 47* 57* 74*  CREATININE 4.93* 5.78* 6.29*  CALCIUM 7.8* 7.8* 8.3*  MG 1.9 1.8 1.7  PHOS 5.1*  --  6.8*   GFR: Estimated Creatinine Clearance: 7.1 mL/min (A) (by C-G formula based on SCr of 6.29 mg/dL (H)).  Liver Function Tests: Recent Labs  Lab 05/17/17 0313 05/18/17 0419  AST 144*  --   ALT 127*  --   ALKPHOS 73  --   BILITOT 0.6  --   PROT 5.0*  --   ALBUMIN 1.9* 1.8*     Cardiac Enzymes: Recent Labs  Lab 05/13/17 0329 05/13/17 0718 05/17/17 0313  CKTOTAL  --   --  3,339*  TROPONINI 5.15* 5.87*  --     HbA1C: Hgb A1c MFr Bld  Date/Time Value Ref Range Status  04/17/2017 11:46 AM 6.8 (H) <5.7 % of total Hgb Final    Comment:    For someone without known diabetes, a hemoglobin A1c value of 6.5% or greater indicates that they may have  diabetes and this should be confirmed with a follow-up  test. . For someone with known diabetes, a value <7% indicates  that their diabetes is well controlled and a value  greater than or equal to 7% indicates suboptimal  control. A1c targets should be individualized based on  duration of diabetes, age, comorbid conditions, and  other considerations. . Currently, no consensus exists regarding use of hemoglobin A1c for diagnosis of diabetes for children. Marland Kitchen   10/20/2014 02:17 PM 7.2 (H) 4.8 - 5.6 % Final    Comment:    (NOTE)         Pre-diabetes: 5.7 - 6.4         Diabetes: >6.4         Glycemic control for adults with diabetes: <7.0     CBG: Recent Labs  Lab 05/17/17 1113 05/17/17 1651 05/17/17 2119 05/18/17 0558 05/18/17 1106  GLUCAP 177* 134* 159* 119* 196*    Recent Results (from the past 240 hour(s))  Blood culture (routine x 2)     Status: None   Collection Time: 05/12/17 11:26 PM  Result Value Ref Range Status   Specimen Description   Final    RIGHT ANTECUBITAL Performed at [facility] [address]  [place], [place] [address]    Special Requests   Final  BOTTLES DRAWN AEROBIC AND ANAEROBIC Blood Culture adequate volume Performed at [facility] [address], [place], [place] [address]    Culture   Final    NO GROWTH 5 DAYS Performed at [facility], [place] [address], [place], [place] [address]    Report Status 05/18/2017 FINAL  Final  Blood culture (routine x 2)     Status: None   Collection Time: 05/12/17 11:31 PM  Result Value Ref Range Status   Specimen Description   Final    BLOOD LEFT HAND Performed at [facility] [address] [place], [place] [address]    Special Requests   Final    BOTTLES DRAWN AEROBIC AND ANAEROBIC Blood Culture adequate volume Performed at [facility] [address] [place], [place] [address]    Culture   Final    NO GROWTH 5 DAYS Performed at [facility], [place] [address] [place], [place] [address]    Report Status 05/18/2017 FINAL  Final  MRSA PCR Screening     Status: None   Collection Time: 05/14/17  5:59 AM  Result Value Ref Range Status   MRSA by PCR NEGATIVE NEGATIVE Final    Comment:        The GeneXpert MRSA Assay (FDA approved for NASAL specimens only), is one component of a comprehensive MRSA colonization surveillance program. It is not intended to diagnose MRSA infection nor to guide or monitor treatment for MRSA infections. Performed at [facility], [place] [address], [place], [place] [address]      Scheduled Meds: . amoxicillin-clavulanate  500 mg Oral Daily  . aspirin  81 mg Oral QPC breakfast  . carvedilol  3.125 mg Oral BID WC  . cholecalciferol  2,000 Units Oral Daily  . doxycycline  100 mg Oral BID  . furosemide  40 mg Intravenous BID  . heparin injection (subcutaneous)  5,000 Units Subcutaneous Q8H  . insulin aspart  0-5 Units Subcutaneous QHS  . insulin aspart  0-9 Units Subcutaneous TID WC  . nitroGLYCERIN  0.2 mg Transdermal Daily    . omega-3 acid ethyl esters  1,000 mg Oral Daily  . pantoprazole  40 mg Oral QPC breakfast  . patiromer  8.4 g Oral Daily  . senna-docusate  1 tablet Oral BID      LOS: 5 days   Cherene Altes, MD [facility] Office  [phone] Pager - Text Page per Amion as per below:  On-Call/Text Page:      [email or web address]      password [email or web address]  If 7PM-7AM, please contact night-coverage [email or web address] Password [facility] 05/18/2017, 3:56 PM

## 2017-05-18 NOTE — Progress Notes (Addendum)
Patient ID: Chelsea [name], female   DOB: [birth year], 84 y.o.   MRN: [id number] [facility] Progress Note   Assessment/ Plan:   1.  Acute kidney injury: This appears to be most likely ATN versus pigment induced nephropathy based on her presentation with relative hypotension as well as exposure to vancomycin/Zosyn-this was corroborated by her fraction excretion of sodium.  She also had elevated CPK levels yesterday and at this point, I suspect that it would be counterproductive to try and hydrate her/forced diuresis to help eliminate myoglobin given her risk of CHF exacerbation/pulmonary edema.  Started on Veltassa for mild hyperkalemia. 2.  Acute coronary syndrome/non-ST elevation MI: Ongoing medical management with low-dose beta-blocker/heparin drip, continue to avoid hypotension,. 3.  Osteomyelitis left foot: Transitioned over to oral doxycycline and Augmentin.  Without clear indicators of sepsis however intermittently hypothermic overnight. 4.  Acute exacerbation of systolic/diastolic congestive heart failure: Suspect that her significant hypoalbuminemia is contributing to third spacing/edema and possibly diuretic resistance-intermittently dosing her with furosemide.  Subjective:   Remains fatigued and feeling "washed out". She reports that she had a terrible night with a sensation of cramping in the lower extremities that seemingly radiated up her thighs.  Objective:   BP (!) 115/57 (BP Location: Left Arm)   Pulse 88   Temp 97.8 F (36.6 C) (Oral)   Resp 17   Ht 5\' 4"  (1.626 m)   Wt 92 kg (202 lb 14.4 oz)   SpO2 94%   BMI 34.83 kg/m   Intake/Output Summary (Last 24 hours) at 05/18/2017 1310 Last data filed at 05/17/2017 2320 Gross per 24 hour  Intake 240 ml  Output 2225 ml  Net -1985 ml   Weight change: -1.065 kg (-2 lb 5.6 oz)  Physical Exam: Gen: Comfortably resting in bed, nurses at bedside CVS: Pulse regular rhythm, normal rate, S1 and S2 normal Resp: Fine rales  right base otherwise clear to auscultation, no rhonchi Abd: Soft, flat, nontender Ext: Left ankle ecchymosis noted, 1-2+ edema, right leg trace-1+ edema  Imaging: No results found.  Labs: BMET Recent Labs  Lab 05/13/17 0058 05/14/17 0203 05/14/17 1008 05/15/17 0348 05/16/17 0311 05/17/17 0313 05/18/17 0419  NA 135 133* 129* 132* 134* 133* 132*  K 4.3 3.9 4.3 4.4 4.7 5.2* 5.6*  CL 100* 99* 98* 101 102 104 103  CO2  --  21* 19* 20* 19* 18* 18*  GLUCOSE 283* 166* 256* 117* 127* 148* 136*  BUN 15 26* 30* 38* 47* 57* 74*  CREATININE 0.80 2.42* 3.07* 4.04* 4.93* 5.78* 6.29*  CALCIUM  --  7.8* 7.5* 7.8* 7.8* 7.8* 8.3*  PHOS  --   --   --   --  5.1*  --  6.8*   CBC Recent Labs  Lab 05/12/17 2326  05/13/17 0944  05/15/17 0348 05/16/17 0311 05/17/17 0313 05/18/17 0419  WBC 9.6  --  11.9*   < > 9.8 9.1 8.4 9.3  NEUTROABS 8.1*  --  9.2*  --   --   --   --   --   HGB 16.3*   < > 12.1   < > 9.8* 10.8* 9.7* 9.7*  HCT 49.5*   < > 37.5   < > 30.8* 32.4* 29.3* 29.8*  MCV 85.3  --  84.7   < > 84.4 84.6 84.4 84.7  PLT 214  --  234   < > 271 314 293 293   < > = values in this interval not  displayed.    Medications:    . amoxicillin-clavulanate  500 mg Oral Daily  . aspirin  81 mg Oral QPC breakfast  . carvedilol  3.125 mg Oral BID WC  . cholecalciferol  2,000 Units Oral Daily  . doxycycline  100 mg Oral BID  . heparin injection (subcutaneous)  5,000 Units Subcutaneous Q8H  . insulin aspart  0-5 Units Subcutaneous QHS  . insulin aspart  0-9 Units Subcutaneous TID WC  . nitroGLYCERIN  0.2 mg Transdermal Daily  . omega-3 acid ethyl esters  1,000 mg Oral Daily  . pantoprazole  40 mg Oral QPC breakfast  . patiromer  8.4 g Oral Daily  . senna-docusate  1 tablet Oral BID   Elmarie Shiley, MD 05/18/2017, 1:10 PM

## 2017-05-18 NOTE — Telephone Encounter (Signed)
Left a message for [name] checking to see how her mother was doing, asked her to call back if needed. Landis Gandy, RN

## 2017-05-18 NOTE — Telephone Encounter (Signed)
-----   Message from Emmaline Kluver sent at 05/17/2017  2:59 PM EDT ----- Contact: laura boylon [phone] Patients Daughter called to cancel her apt on 05-24-17 states she is in [facility] has had a heart attack and something going on with her kidneys her foot is not wrapped and swelling she is concerned about her foot. Could someone give her a call.   Thanks  Joycelyn Schmid

## 2017-05-19 LAB — GLUCOSE, CAPILLARY
GLUCOSE-CAPILLARY: 104 mg/dL — AB (ref 65–99)
GLUCOSE-CAPILLARY: 181 mg/dL — AB (ref 65–99)
GLUCOSE-CAPILLARY: 122 mg/dL — AB (ref 65–99)
GLUCOSE-CAPILLARY: 151 mg/dL — AB (ref 65–99)

## 2017-05-19 LAB — BASIC METABOLIC PANEL
ANION GAP: 13 (ref 5–15)
BUN: 94 mg/dL — ABNORMAL HIGH (ref 6–20)
CHLORIDE: 104 mmol/L (ref 101–111)
CO2: 18 mmol/L — ABNORMAL LOW (ref 22–32)
Calcium: 8.8 mg/dL — ABNORMAL LOW (ref 8.9–10.3)
Creatinine, Ser: 6.97 mg/dL — ABNORMAL HIGH (ref 0.44–1.00)
GFR, EST AFRICAN AMERICAN: 5 mL/min — AB (ref >60)
GFR, EST NON AFRICAN AMERICAN: 5 mL/min — AB (ref >60)
Glucose, Bld: 135 mg/dL — ABNORMAL HIGH (ref 65–99)
Potassium: 5.6 mmol/L — ABNORMAL HIGH (ref 3.5–5.1)
SODIUM: 135 mmol/L (ref 135–145)

## 2017-05-19 LAB — RENAL FUNCTION PANEL
ALBUMIN: 1.9 g/dL — AB (ref 3.5–5.0)
ANION GAP: 11 (ref 5–15)
BUN: 87 mg/dL — ABNORMAL HIGH (ref 6–20)
CHLORIDE: 102 mmol/L (ref 101–111)
CO2: 19 mmol/L — ABNORMAL LOW (ref 22–32)
Calcium: 8.5 mg/dL — ABNORMAL LOW (ref 8.9–10.3)
Creatinine, Ser: 6.66 mg/dL — ABNORMAL HIGH (ref 0.44–1.00)
GFR calc Af Amer: 6 mL/min — ABNORMAL LOW (ref >60)
GFR, EST NON AFRICAN AMERICAN: 5 mL/min — AB (ref >60)
Glucose, Bld: 124 mg/dL — ABNORMAL HIGH (ref 65–99)
PHOSPHORUS: 7.5 mg/dL — AB (ref 2.5–4.6)
POTASSIUM: 5.8 mmol/L — AB (ref 3.5–5.1)
Sodium: 132 mmol/L — ABNORMAL LOW (ref 135–145)

## 2017-05-19 LAB — CBC
HEMATOCRIT: 28.9 % — AB (ref 36.0–46.0)
HEMOGLOBIN: 9.5 g/dL — AB (ref 12.0–15.0)
MCH: 27.5 pg (ref 26.0–34.0)
MCHC: 32.9 g/dL (ref 30.0–36.0)
MCV: 83.5 fL (ref 78.0–100.0)
Platelets: 293 10*3/uL (ref 150–400)
RBC: 3.46 MIL/uL — ABNORMAL LOW (ref 3.87–5.11)
RDW: 14.5 % (ref 11.5–15.5)
WBC: 10.2 10*3/uL (ref 4.0–10.5)

## 2017-05-19 MED ORDER — SODIUM POLYSTYRENE SULFONATE 15 GM/60ML PO SUSP
45.0000 g | Freq: Once | ORAL | Status: AC
  Administered 2017-05-19: 45 g via ORAL
  Filled 2017-05-19: qty 180

## 2017-05-19 MED ORDER — CYCLOBENZAPRINE HCL 10 MG PO TABS: 5.0000 mg | ORAL_TABLET | Freq: Three times a day (TID) | ORAL | Status: DC | PRN

## 2017-05-19 MED ORDER — SODIUM POLYSTYRENE SULFONATE 15 GM/60ML PO SUSP
45.0000 g | Freq: Once | ORAL | Status: DC
  Administered 2017-05-19: 45 g via ORAL

## 2017-05-19 NOTE — Progress Notes (Signed)
[place] TEAM 1 - [place]  YWV:[id number] DOB: Jan 07, 1929 DOA: 05/12/2017 PCP: Jani Gravel, MD    Brief Narrative:  84y.o. F w/ a Hx of CVA, DM2 , HTN, Basal cell carcinoma of breast 1998, COPD, HLD, ITP, Hiatal hernia, and Chronic Osteomyelitis ofLfoot on chronic PO ABx who presented to the ED with c/o R shoulder pain. She called EMS after being unable to get out of bed.  Per EMS initial SBP was 70.  In the ED Trop was 3.5. EKG showed inferior ST depressions.  Significant Events: 4/06 admit 4/8 TTE - EF 40-45% - hypokinesis of mid-apical, anteroseptal, anterior, and apical myocardium c/w ishcemia of LAD - grade 1 DD   Subjective: The pt is currently having diarrhea related to her kayexelate dose.  She denies cp, sob, n/v, or abdom pain, and tells me she actually feels better overall today.    I spoke with her very frankly about her medical condition and discussed her CODE STATUS.  She tells me she would still wish to undergo aggressive care to include intubation and CPR if it is felt she has a reasonable chance of making her recovery but that she would not wish to be supported in such a way long-term.  I also called and spoke with her daughter, with the patient's consent, and gave her an update on the patient's condition.  Assessment & Plan:  NSTEMI  Elevated troponin - EKG and TTE c/w LAD disease - Cards unable to cath due to progressive kidney failure - prognosis poor at this time - cont medical tx only for now   Acute ischemic systolic CHF Due to AMI w/ LAD CAD - was now developed volume overload in setting of severe renal failure - diuretic ongoing per Nephrology   Filed Weights   05/17/17 0450 05/18/17 0435 05/19/17 0328  Weight: 93.1 kg (205 lb 4 oz) 92 kg (202 lb 14.4 oz) 89.6 kg (197 lb 8.5 oz)    Nonoliguric Acute kidney failure   crt still climbing - ?ATN due to hypotension in setting of LV infarct + use of ARB (prior to admit) - placed  foley to assure bladder is fully and consistently decompressed and to allow accurate Is/Os - stopped Vanc - avoid potential nephrotoxins - renal US w/o acute findings/hydro - Nephrology now following - no signs of recovery thus far - not sure if she would even be a HD candidate - discussed w/ pt and daughter, who understand her renal fxn is a major determinate of her prognosis at this time   Recent Labs  Lab 05/15/17 0348 05/16/17 0311 05/17/17 0313 05/18/17 0419 05/19/17 0323  CREATININE 4.04* 4.93* 5.78* 6.29* 6.66*    Altered mental status  CT head noted chronic moderate small vessel disease w/ atrophy - suspect there is a component of baseline dementia - likely now an element of uremia at play as well - appears to be more alert and clear today   Chronic osteomyelitis L foot Change back to usual home oral abx regimen - followed by Dr. Megan Salon in [facility] - does not appear to be playing a role in her acute illness (afebrilie w/ normal WBC)  DM 2 controlled with complication 8/54 O2V 6.8 - CBG reasonably controlled at this time - watch for hypoglycemia in setting of worsening renal fxn   HTN BP controlled   COPD Well compensated  DVT prophylaxis: SQ heparin  Code Status: FULL CODE Family Communication: no family present  at time of exam  Disposition Plan: SDU   Consultants:  Cardiology   Antimicrobials:  Augmentin > Doxycycline >  Objective: Blood pressure 137/69, pulse 82, temperature 97.6 F (36.4 C), temperature source Oral, resp. rate (!) 26, height 5\' 4"  (1.626 m), weight 89.6 kg (197 lb 8.5 oz), SpO2 96 %.  Intake/Output Summary (Last 24 hours) at 05/19/2017 1521 Last data filed at 05/18/2017 2240 Gross per 24 hour  Intake 330 ml  Output 625 ml  Net -295 ml   Filed Weights   05/17/17 0450 05/18/17 0435 05/19/17 0328  Weight: 93.1 kg (205 lb 4 oz) 92 kg (202 lb 14.4 oz) 89.6 kg (197 lb 8.5 oz)    Examination: General: No acute respiratory distress -  alert and conversant  Lungs: poor air movement in bases B w/o change - no wheezing  Cardiovascular: JVD - RRR - no M or rub  Extremities: L LE w/ deformity at ankle - limited ability to move L foot - 2++ edema B LE w/o change   CBC: Recent Labs  Lab 05/12/17 2326  05/13/17 0944  05/17/17 0313 05/18/17 0419 05/19/17 0323  WBC 9.6  --  11.9*   < > 8.4 9.3 10.2  NEUTROABS 8.1*  --  9.2*  --   --   --   --   HGB 16.3*   < > 12.1   < > 9.7* 9.7* 9.5*  HCT 49.5*   < > 37.5   < > 29.3* 29.8* 28.9*  MCV 85.3  --  84.7   < > 84.4 84.7 83.5  PLT 214  --  234   < > 293 293 293   < > = values in this interval not displayed.   Basic Metabolic Panel: Recent Labs  Lab 05/16/17 0311 05/17/17 0313 05/18/17 0419 05/19/17 0323  NA 134* 133* 132* 132*  K 4.7 5.2* 5.6* 5.8*  CL 102 104 103 102  CO2 19* 18* 18* 19*  GLUCOSE 127* 148* 136* 124*  BUN 47* 57* 74* 87*  CREATININE 4.93* 5.78* 6.29* 6.66*  CALCIUM 7.8* 7.8* 8.3* 8.5*  MG 1.9 1.8 1.7  --   PHOS 5.1*  --  6.8* 7.5*   GFR: Estimated Creatinine Clearance: 6.6 mL/min (A) (by C-G formula based on SCr of 6.66 mg/dL (H)).  Liver Function Tests: Recent Labs  Lab 05/17/17 0313 05/18/17 0419 05/19/17 0323  AST 144*  --   --   ALT 127*  --   --   ALKPHOS 73  --   --   BILITOT 0.6  --   --   PROT 5.0*  --   --   ALBUMIN 1.9* 1.8* 1.9*     Cardiac Enzymes: Recent Labs  Lab 05/13/17 0329 05/13/17 0718 05/17/17 0313  CKTOTAL  --   --  3,339*  TROPONINI 5.15* 5.87*  --     HbA1C: Hgb A1c MFr Bld  Date/Time Value Ref Range Status  04/17/2017 11:46 AM 6.8 (H) <5.7 % of total Hgb Final    Comment:    For someone without known diabetes, a hemoglobin A1c value of 6.5% or greater indicates that they may have  diabetes and this should be confirmed with a follow-up  test. . For someone with known diabetes, a value <7% indicates  that their diabetes is well controlled and a value  greater than or equal to 7% indicates  suboptimal  control. A1c targets should be individualized based on  duration of diabetes,  age, comorbid conditions, and  other considerations. . Currently, no consensus exists regarding use of hemoglobin A1c for diagnosis of diabetes for children. Marland Kitchen   10/20/2014 02:17 PM 7.2 (H) 4.8 - 5.6 % Final    Comment:    (NOTE)         Pre-diabetes: 5.7 - 6.4         Diabetes: >6.4         Glycemic control for adults with diabetes: <7.0     CBG: Recent Labs  Lab 05/18/17 1106 05/18/17 1619 05/18/17 2057 05/19/17 0606 05/19/17 1141  GLUCAP 196* 128* 149* 104* 181*    Recent Results (from the past 240 hour(s))  Blood culture (routine x 2)     Status: None   Collection Time: 05/12/17 11:26 PM  Result Value Ref Range Status   Specimen Description   Final    RIGHT ANTECUBITAL Performed at [facility], [place] [address] [place], [place] [address]    Special Requests   Final    BOTTLES DRAWN AEROBIC AND ANAEROBIC Blood Culture adequate volume Performed at [facility] [address] [place], [place] [address]    Culture   Final    NO GROWTH 5 DAYS Performed at [facility], [place] [address], [place], [place] [address]    Report Status 05/18/2017 FINAL  Final  Blood culture (routine x 2)     Status: None   Collection Time: 05/12/17 11:31 PM  Result Value Ref Range Status   Specimen Description   Final    BLOOD LEFT HAND Performed at [facility] [address], [place], [place] [address]    Special Requests   Final    BOTTLES DRAWN AEROBIC AND ANAEROBIC Blood Culture adequate volume Performed at [facility] [address], [place], [place] [address]    Culture   Final    NO GROWTH 5 DAYS Performed at [facility], [place] [address] [place], [place] [address]    Report Status 05/18/2017 FINAL  Final  MRSA PCR Screening     Status: None   Collection Time: 05/14/17  5:59 AM  Result Value  Ref Range Status   MRSA by PCR NEGATIVE NEGATIVE Final    Comment:        The GeneXpert MRSA Assay (FDA approved for NASAL specimens only), is one component of a comprehensive MRSA colonization surveillance program. It is not intended to diagnose MRSA infection nor to guide or monitor treatment for MRSA infections. Performed at [facility], [place] [address], [place],  [address]      Scheduled Meds: . amoxicillin-clavulanate  500 mg Oral Daily  . aspirin  81 mg Oral QPC breakfast  . carvedilol  3.125 mg Oral BID WC  . cholecalciferol  2,000 Units Oral Daily  . doxycycline  100 mg Oral BID  . furosemide  40 mg Intravenous BID  . heparin injection (subcutaneous)  5,000 Units Subcutaneous Q8H  . insulin aspart  0-5 Units Subcutaneous QHS  . insulin aspart  0-9 Units Subcutaneous TID WC  . nitroGLYCERIN  0.2 mg Transdermal Daily  . omega-3 acid ethyl esters  1,000 mg Oral Daily  . pantoprazole  40 mg Oral QPC breakfast  . senna-docusate  1 tablet Oral BID      LOS: 6 days   Cherene Altes, MD [facility] Office  [phone] Pager - Text Page per Amion as per below:  On-Call/Text Page:      [email or web address]      password [email or web address]  If 7PM-7AM, please contact night-coverage [email or web address] Password [email or web address] 05/19/2017, 3:21 PM

## 2017-05-19 NOTE — Progress Notes (Signed)
Patient ID: CAMORA TREMAIN, female   DOB: July 29, 1928, 84 y.o.   MRN: [id number] [facility] Progress Note   Assessment/ Plan:   1.  Acute kidney injury: This appears to be most likely ATN versus pigment induced nephropathy based on her presentation with relative hypotension as well as exposure to vancomycin/Zosyn-this was corroborated by her fraction excretion of sodium.  Renal function worsening on labs noted this morning but without any acute indications for hemodialysis-will give higher dose of Kayexalate today for potassium lowering along with low potassium diet. 2.  Acute coronary syndrome/non-ST elevation MI: Ongoing medical management with low-dose beta-blocker/heparin drip, continue to avoid hypotension,. 3.  Osteomyelitis left foot:  Continued on her outpatient doses of doxycycline/Augmentin. 4.  Acute exacerbation of systolic/diastolic congestive heart failure: Suspect that her significant hypoalbuminemia is contributing to third spacing/edema and possibly diuretic resistance-intermittently dosing her with furosemide given evidence of volume excess.  Subjective:   Reports poor appetite but denies any nausea, vomiting or dysgeusia. She had some right-sided shoulder pain but denies any chest pain or shortness of breath.  Objective:   BP 130/66   Pulse 88   Temp 97.6 F (36.4 C) (Oral)   Resp (!) 27   Ht 5\' 4"  (1.626 m)   Wt 89.6 kg (197 lb 8.5 oz)   SpO2 95%   BMI 33.91 kg/m   Intake/Output Summary (Last 24 hours) at 05/19/2017 1039 Last data filed at 05/18/2017 2240 Gross per 24 hour  Intake 430 ml  Output 1175 ml  Net -745 ml   Weight change: -2.435 kg (-5 lb 5.9 oz)  Physical Exam: Gen: Comfortably resting in bed, no asterixis CVS: Pulse regular rhythm, normal rate, S1 and S2 normal Resp: Fine rales right base otherwise clear to auscultation, no rhonchi Abd: Soft, flat, nontender Ext: Left ankle ecchymosis noted, 1-2+ edema, right leg trace-1+ edema.    Imaging: No results found.  Labs: BMET Recent Labs  Lab 05/14/17 0203 05/14/17 1008 05/15/17 0348 05/16/17 0311 05/17/17 0313 05/18/17 0419 05/19/17 0323  NA 133* 129* 132* 134* 133* 132* 132*  K 3.9 4.3 4.4 4.7 5.2* 5.6* 5.8*  CL 99* 98* 101 102 104 103 102  CO2 21* 19* 20* 19* 18* 18* 19*  GLUCOSE 166* 256* 117* 127* 148* 136* 124*  BUN 26* 30* 38* 47* 57* 74* 87*  CREATININE 2.42* 3.07* 4.04* 4.93* 5.78* 6.29* 6.66*  CALCIUM 7.8* 7.5* 7.8* 7.8* 7.8* 8.3* 8.5*  PHOS  --   --   --  5.1*  --  6.8* 7.5*   CBC Recent Labs  Lab 05/12/17 2326  05/13/17 0944  05/16/17 0311 05/17/17 0313 05/18/17 0419 05/19/17 0323  WBC 9.6  --  11.9*   < > 9.1 8.4 9.3 10.2  NEUTROABS 8.1*  --  9.2*  --   --   --   --   --   HGB 16.3*   < > 12.1   < > 10.8* 9.7* 9.7* 9.5*  HCT 49.5*   < > 37.5   < > 32.4* 29.3* 29.8* 28.9*  MCV 85.3  --  84.7   < > 84.6 84.4 84.7 83.5  PLT 214  --  234   < > 314 293 293 293   < > = values in this interval not displayed.    Medications:    . amoxicillin-clavulanate  500 mg Oral Daily  . aspirin  81 mg Oral QPC breakfast  . carvedilol  3.125 mg Oral  BID WC  . cholecalciferol  2,000 Units Oral Daily  . doxycycline  100 mg Oral BID  . furosemide  40 mg Intravenous BID  . heparin injection (subcutaneous)  5,000 Units Subcutaneous Q8H  . insulin aspart  0-5 Units Subcutaneous QHS  . insulin aspart  0-9 Units Subcutaneous TID WC  . nitroGLYCERIN  0.2 mg Transdermal Daily  . omega-3 acid ethyl esters  1,000 mg Oral Daily  . pantoprazole  40 mg Oral QPC breakfast  . patiromer  8.4 g Oral Daily  . senna-docusate  1 tablet Oral BID   Elmarie Shiley, MD 05/19/2017, 10:39 AM

## 2017-05-20 ENCOUNTER — Inpatient Hospital Stay (HOSPITAL_COMMUNITY): Payer: Medicare Other

## 2017-05-20 DIAGNOSIS — R609 Edema, unspecified: Secondary | ICD-10-CM

## 2017-05-20 LAB — GLUCOSE, CAPILLARY
GLUCOSE-CAPILLARY: 160 mg/dL — AB (ref 65–99)
Glucose-Capillary: 129 mg/dL — ABNORMAL HIGH (ref 65–99)
Glucose-Capillary: 175 mg/dL — ABNORMAL HIGH (ref 65–99)
Glucose-Capillary: 125 mg/dL — ABNORMAL HIGH (ref 65–99)
Glucose-Capillary: 156 mg/dL — ABNORMAL HIGH (ref 65–99)

## 2017-05-20 LAB — CBC
HCT: 29.8 % — ABNORMAL LOW (ref 36.0–46.0)
HEMOGLOBIN: 9.9 g/dL — AB (ref 12.0–15.0)
MCH: 27.8 pg (ref 26.0–34.0)
MCHC: 33.2 g/dL (ref 30.0–36.0)
MCV: 83.7 fL (ref 78.0–100.0)
Platelets: 340 10*3/uL (ref 150–400)
RBC: 3.56 MIL/uL — ABNORMAL LOW (ref 3.87–5.11)
RDW: 14.9 % (ref 11.5–15.5)
WBC: 9.7 10*3/uL (ref 4.0–10.5)

## 2017-05-20 LAB — BASIC METABOLIC PANEL
ANION GAP: 14 (ref 5–15)
BUN: 96 mg/dL — AB (ref 6–20)
CALCIUM: 8.6 mg/dL — AB (ref 8.9–10.3)
CO2: 18 mmol/L — AB (ref 22–32)
CREATININE: 6.9 mg/dL — AB (ref 0.44–1.00)
Chloride: 104 mmol/L (ref 101–111)
GFR calc Af Amer: 6 mL/min — ABNORMAL LOW (ref >60)
GFR calc non Af Amer: 5 mL/min — ABNORMAL LOW (ref >60)
GLUCOSE: 139 mg/dL — AB (ref 65–99)
Potassium: 4.9 mmol/L (ref 3.5–5.1)
Sodium: 136 mmol/L (ref 135–145)

## 2017-05-20 MED ORDER — FUROSEMIDE 10 MG/ML IJ SOLN
40.0000 mg | Freq: Once | INTRAMUSCULAR | Status: AC
  Administered 2017-05-20: 40 mg via INTRAVENOUS
  Filled 2017-05-20: qty 4

## 2017-05-20 NOTE — Progress Notes (Signed)
VASCULAR LAB PRELIMINARY  PRELIMINARY  PRELIMINARY  PRELIMINARY  Left lower extremity venous duplex completed.    Preliminary report:  There is no obvious evidence of DVT or SVT noted in the left lower extremity.   Merrell Borsuk, RVT 05/20/2017, 4:07 PM

## 2017-05-20 NOTE — Progress Notes (Signed)
Patient ID: RAPHAELLA LARKIN, female   DOB: [birth year], 84 y.o.   MRN: [id number] [facility] Progress Note   Assessment/ Plan:   1.  Acute kidney injury: This appears to be most likely ATN versus pigment induced nephropathy based on her presentation with relative hypotension as well as exposure to vancomycin/Zosyn-this was corroborated by her fraction excretion of sodium.  With good urine output overnight and what appears to be unchanged and stable renal function overnight that likely reflects that she is in the plateau phase of ATN.  Without indications for dialysis. 2.  Acute coronary syndrome/non-ST elevation MI: Ongoing medical management with low-dose beta-blocker/heparin drip, continue to avoid hypotension and will give a single dose of IV furosemide today. 3.  Osteomyelitis left foot:  Continued on her outpatient doses of doxycycline/Augmentin.  I suspect she will need an MRI of the left foot/ankle has ecchymosis/erythema worsening. 4.  Acute exacerbation of systolic/diastolic congestive heart failure: Suspect that her significant hypoalbuminemia is contributing to third spacing/edema and possibly diuretic resistance-intermittently dosing her with furosemide given evidence of volume excess.  Subjective:   Denies any acute events overnight-concerned about increasing erythema/ecchymosis left ankle.  Objective:   BP 140/74 (BP Location: Right Arm)   Pulse 83   Temp 97.9 F (36.6 C) (Oral)   Resp 17   Ht 5\' 4"  (1.626 m)   Wt 87.8 kg (193 lb 9 oz)   SpO2 96%   BMI 33.23 kg/m   Intake/Output Summary (Last 24 hours) at 05/20/2017 1006 Last data filed at 05/20/2017 0423 Gross per 24 hour  Intake 600 ml  Output 1975 ml  Net -1375 ml   Weight change: -1.8 kg (-3 lb 15.5 oz)  Physical Exam: Gen: Comfortably resting in bed, no asterixis CVS: Pulse regular rhythm, normal rate, S1 and S2 normal Resp: Decreased breath sounds over lower fields otherwise clear to auscultation, no  rhonchi Abd: Soft, flat, nontender Ext: Left ankle ecchymosis/erythema noted, 1-2+ edema, right leg trace-1+ edema.   Imaging: No results found.  Labs: BMET Recent Labs  Lab 05/15/17 0348 05/16/17 0311 05/17/17 0313 05/18/17 0419 05/19/17 0323 05/19/17 1623 05/20/17 0446  NA 132* 134* 133* 132* 132* 135 136  K 4.4 4.7 5.2* 5.6* 5.8* 5.6* 4.9  CL 101 102 104 103 102 104 104  CO2 20* 19* 18* 18* 19* 18* 18*  GLUCOSE 117* 127* 148* 136* 124* 135* 139*  BUN 38* 47* 57* 74* 87* 94* 96*  CREATININE 4.04* 4.93* 5.78* 6.29* 6.66* 6.97* 6.90*  CALCIUM 7.8* 7.8* 7.8* 8.3* 8.5* 8.8* 8.6*  PHOS  --  5.1*  --  6.8* 7.5*  --   --    CBC Recent Labs  Lab 05/17/17 0313 05/18/17 0419 05/19/17 0323 05/20/17 0446  WBC 8.4 9.3 10.2 9.7  HGB 9.7* 9.7* 9.5* 9.9*  HCT 29.3* 29.8* 28.9* 29.8*  MCV 84.4 84.7 83.5 83.7  PLT 293 293 293 340    Medications:    . amoxicillin-clavulanate  500 mg Oral Daily  . aspirin  81 mg Oral QPC breakfast  . carvedilol  3.125 mg Oral BID WC  . cholecalciferol  2,000 Units Oral Daily  . doxycycline  100 mg Oral BID  . heparin injection (subcutaneous)  5,000 Units Subcutaneous Q8H  . insulin aspart  0-5 Units Subcutaneous QHS  . insulin aspart  0-9 Units Subcutaneous TID WC  . nitroGLYCERIN  0.2 mg Transdermal Daily  . omega-3 acid ethyl esters  1,000 mg Oral Daily  .  pantoprazole  40 mg Oral QPC breakfast  . senna-docusate  1 tablet Oral BID   Elmarie Shiley, MD 05/20/2017, 10:06 AM

## 2017-05-20 NOTE — Progress Notes (Signed)
[facility] TEAM 1 - [place]  VVO:[id number] DOB: 1928/09/05 DOA: 05/12/2017 PCP: Jani Gravel, MD    Brief Narrative:  84y.o. F w/ a Hx of CVA, DM2 , HTN, Basal cell carcinoma of breast 1998, COPD, HLD, ITP, Hiatal hernia, and Chronic Osteomyelitis ofLfoot on chronic PO ABx who presented to the ED with c/o R shoulder pain. She called EMS after being unable to get out of bed.  Per EMS initial SBP was 70.  In the ED Trop was 3.5. EKG showed inferior ST depressions.  Significant Events: 4/06 admit 4/8 TTE - EF 40-45% - hypokinesis of mid-apical, anteroseptal, anterior, and apical myocardium c/w ishcemia of LAD - grade 1 DD   Subjective: Pt c/o some pain in her L shoulder which is new.  This pain is worse w/ movement.  She denies f/c, n/v, or abdom pain.  Her RN tells me she has been refusing to get up out of bed.    Her L leg has become progressively more swollen, out of proportion to the R.  She denies pain in the L leg.  Nephrology and I agree that an MRI to r/o abcess/osteo would be helpful, but the pt is refusing telling me she has had one before and that the pain of laying on the table for the imaging is too great.    Assessment & Plan:  NSTEMI  Elevated troponin - EKG and TTE c/w LAD disease - Cards unable to cath due to progressive kidney failure - prognosis poor at this time - cont medical tx only for now   Acute ischemic systolic CHF Due to AMI w/ LAD CAD - was now developed volume overload in setting of severe renal failure - diuretic ongoing per Nephrology   Filed Weights   05/18/17 0435 05/19/17 0328 05/20/17 0417  Weight: 92 kg (202 lb 14.4 oz) 89.6 kg (197 lb 8.5 oz) 87.8 kg (193 lb 9 oz)    Nonoliguric Acute kidney failure   crt possibly peaked at 6.97 - ?ATN due to hypotension in setting of LV infarct + use of ARB (prior to admit) - placed foley to assure bladder is fully and consistently decompressed and to allow accurate Is/Os - stopped Vanc  - avoid potential nephrotoxins - renal US w/o acute findings/hydro - Nephrology following - follow trend   Recent Labs  Lab 05/17/17 0313 05/18/17 0419 05/19/17 0323 05/19/17 1623 05/20/17 0446  CREATININE 5.78* 6.29* 6.66* 6.97* 6.90*    L LE edema out of proportion to R W/ chronic ankle/foot osteo this is worrisome - pt refuses MRI for eval - will check doppler to r/o DVT   Altered mental status  CT head noted chronic moderate small vessel disease w/ atrophy - suspect there is a component of baseline dementia - likely now an element of uremia at play as well - actually A&O today   Chronic osteomyelitis L foot Change back to usual home oral abx regimen - followed by Dr. Megan Salon in [facility]  DM 2 controlled with complication 2/69 S8N 6.8 - CBG reasonably controlled at this time - no changes in tx plan today   HTN BP controlled   COPD Well compensated  DVT prophylaxis: SQ heparin  Code Status: FULL CODE Family Communication: no family present at time of exam  Disposition Plan: SDU   Consultants:  Cardiology  Nephrology   Antimicrobials:  Augmentin > Doxycycline >  Objective: Blood pressure 140/74, pulse 83, temperature 97.9 F (36.6  C), temperature source Oral, resp. rate 17, height 5\' 4"  (1.626 m), weight 87.8 kg (193 lb 9 oz), SpO2 96 %.  Intake/Output Summary (Last 24 hours) at 05/20/2017 1113 Last data filed at 05/20/2017 0423 Gross per 24 hour  Intake 600 ml  Output 1975 ml  Net -1375 ml   Filed Weights   05/18/17 0435 05/19/17 0328 05/20/17 0417  Weight: 92 kg (202 lb 14.4 oz) 89.6 kg (197 lb 8.5 oz) 87.8 kg (193 lb 9 oz)    Examination: General: No acute respiratory distress - alert & O Lungs: poor air movement in bases B w/o change Cardiovascular: JVD+ - RRR - no M or rub  Extremities: L LE w/ deformity at ankle - limited ability to move L foot - 3++ edema L LE significantly increased over last 24hrs, but w/o signif change in erythema and w/o  d/c - 2+ edema R LE   CBC: Recent Labs  Lab 05/18/17 0419 05/19/17 0323 05/20/17 0446  WBC 9.3 10.2 9.7  HGB 9.7* 9.5* 9.9*  HCT 29.8* 28.9* 29.8*  MCV 84.7 83.5 83.7  PLT 293 293 119   Basic Metabolic Panel: Recent Labs  Lab 05/16/17 0311 05/17/17 0313 05/18/17 0419 05/19/17 0323 05/19/17 1623 05/20/17 0446  NA 134* 133* 132* 132* 135 136  K 4.7 5.2* 5.6* 5.8* 5.6* 4.9  CL 102 104 103 102 104 104  CO2 19* 18* 18* 19* 18* 18*  GLUCOSE 127* 148* 136* 124* 135* 139*  BUN 47* 57* 74* 87* 94* 96*  CREATININE 4.93* 5.78* 6.29* 6.66* 6.97* 6.90*  CALCIUM 7.8* 7.8* 8.3* 8.5* 8.8* 8.6*  MG 1.9 1.8 1.7  --   --   --   PHOS 5.1*  --  6.8* 7.5*  --   --    GFR: Estimated Creatinine Clearance: 6.3 mL/min (A) (by C-G formula based on SCr of 6.9 mg/dL (H)).  Liver Function Tests: Recent Labs  Lab 05/17/17 0313 05/18/17 0419 05/19/17 0323  AST 144*  --   --   ALT 127*  --   --   ALKPHOS 73  --   --   BILITOT 0.6  --   --   PROT 5.0*  --   --   ALBUMIN 1.9* 1.8* 1.9*     Cardiac Enzymes: Recent Labs  Lab 05/17/17 0313  CKTOTAL 3,339*    HbA1C: Hgb A1c MFr Bld  Date/Time Value Ref Range Status  04/17/2017 11:46 AM 6.8 (H) <5.7 % of total Hgb Final    Comment:    For someone without known diabetes, a hemoglobin A1c value of 6.5% or greater indicates that they may have  diabetes and this should be confirmed with a follow-up  test. . For someone with known diabetes, a value <7% indicates  that their diabetes is well controlled and a value  greater than or equal to 7% indicates suboptimal  control. A1c targets should be individualized based on  duration of diabetes, age, comorbid conditions, and  other considerations. . Currently, no consensus exists regarding use of hemoglobin A1c for diagnosis of diabetes for children. Marland Kitchen   10/20/2014 02:17 PM 7.2 (H) 4.8 - 5.6 % Final    Comment:    (NOTE)         Pre-diabetes: 5.7 - 6.4         Diabetes: >6.4          Glycemic control for adults with diabetes: <7.0     CBG: Recent Labs  Lab 05/19/17 1141 05/19/17 1638 05/19/17 2123 05/20/17 0645 05/20/17 0844  GLUCAP 181* 122* 151* 129* 175*    Recent Results (from the past 240 hour(s))  Blood culture (routine x 2)     Status: None   Collection Time: 05/12/17 11:26 PM  Result Value Ref Range Status   Specimen Description   Final    RIGHT ANTECUBITAL Performed at [facility], [place] [address], [place], [place] [address]    Special Requests   Final    BOTTLES DRAWN AEROBIC AND ANAEROBIC Blood Culture adequate volume Performed at [place] [address] [place], [place] [address]    Culture   Final    NO GROWTH 5 DAYS Performed at [facility], [place] [address], [place], [place] [address]    Report Status 05/18/2017 FINAL  Final  Blood culture (routine x 2)     Status: None   Collection Time: 05/12/17 11:31 PM  Result Value Ref Range Status   Specimen Description   Final    BLOOD LEFT HAND Performed at [facility] [address] [place], [place] [address]    Special Requests   Final    BOTTLES DRAWN AEROBIC AND ANAEROBIC Blood Culture adequate volume Performed at [facility] [address], [place], [place] [address]    Culture   Final    NO GROWTH 5 DAYS Performed at [facility], [place] [address] [place], [place] [address]    Report Status 05/18/2017 FINAL  Final  MRSA PCR Screening     Status: None   Collection Time: 05/14/17  5:59 AM  Result Value Ref Range Status   MRSA by PCR NEGATIVE NEGATIVE Final    Comment:        The GeneXpert MRSA Assay (FDA approved for NASAL specimens only), is one component of a comprehensive MRSA colonization surveillance program. It is not intended to diagnose MRSA infection nor to guide or monitor treatment for MRSA infections. Performed at [facility], [place] [address] [place],  [place] [address]      Scheduled Meds: . amoxicillin-clavulanate  500 mg Oral Daily  . aspirin  81 mg Oral QPC breakfast  . carvedilol  3.125 mg Oral BID WC  . cholecalciferol  2,000 Units Oral Daily  . doxycycline  100 mg Oral BID  . furosemide  40 mg Intravenous Once  . heparin injection (subcutaneous)  5,000 Units Subcutaneous Q8H  . insulin aspart  0-5 Units Subcutaneous QHS  . insulin aspart  0-9 Units Subcutaneous TID WC  . nitroGLYCERIN  0.2 mg Transdermal Daily  . omega-3 acid ethyl esters  1,000 mg Oral Daily  . pantoprazole  40 mg Oral QPC breakfast  . senna-docusate  1 tablet Oral BID      LOS: 7 days   Cherene Altes, MD [facility] Office  [phone] Pager - Text Page per Amion as per below:  On-Call/Text Page:      [email or web address]      password [email or web address]  If 7PM-7AM, please contact night-coverage [email or web address] Password [email or web address] 05/20/2017, 11:13 AM

## 2017-05-21 LAB — CBC
HCT: 27.6 % — ABNORMAL LOW (ref 36.0–46.0)
HEMOGLOBIN: 9.2 g/dL — AB (ref 12.0–15.0)
MCH: 27.9 pg (ref 26.0–34.0)
MCHC: 33.3 g/dL (ref 30.0–36.0)
MCV: 83.6 fL (ref 78.0–100.0)
Platelets: 348 10*3/uL (ref 150–400)
RBC: 3.3 MIL/uL — AB (ref 3.87–5.11)
RDW: 15.3 % (ref 11.5–15.5)
WBC: 8.2 10*3/uL (ref 4.0–10.5)

## 2017-05-21 LAB — COMPREHENSIVE METABOLIC PANEL
ALBUMIN: 1.8 g/dL — AB (ref 3.5–5.0)
ALK PHOS: 56 U/L (ref 38–126)
ALT: 60 U/L — AB (ref 14–54)
ANION GAP: 14 (ref 5–15)
AST: 25 U/L (ref 15–41)
BUN: 105 mg/dL — ABNORMAL HIGH (ref 6–20)
CHLORIDE: 103 mmol/L (ref 101–111)
CO2: 19 mmol/L — AB (ref 22–32)
CREATININE: 7.33 mg/dL — AB (ref 0.44–1.00)
Calcium: 8.4 mg/dL — ABNORMAL LOW (ref 8.9–10.3)
GFR calc non Af Amer: 4 mL/min — ABNORMAL LOW (ref >60)
GFR, EST AFRICAN AMERICAN: 5 mL/min — AB (ref >60)
GLUCOSE: 121 mg/dL — AB (ref 65–99)
Potassium: 4.9 mmol/L (ref 3.5–5.1)
SODIUM: 136 mmol/L (ref 135–145)
Total Bilirubin: 0.6 mg/dL (ref 0.3–1.2)
Total Protein: 5.1 g/dL — ABNORMAL LOW (ref 6.5–8.1)

## 2017-05-21 LAB — GLUCOSE, CAPILLARY
GLUCOSE-CAPILLARY: 160 mg/dL — AB (ref 65–99)
GLUCOSE-CAPILLARY: 133 mg/dL — AB (ref 65–99)
Glucose-Capillary: 123 mg/dL — ABNORMAL HIGH (ref 65–99)
Glucose-Capillary: 107 mg/dL — ABNORMAL HIGH (ref 65–99)

## 2017-05-21 LAB — CK: Total CK: 114 U/L (ref 38–234)

## 2017-05-21 MED ORDER — FUROSEMIDE 10 MG/ML IJ SOLN
40.0000 mg | Freq: Two times a day (BID) | INTRAMUSCULAR | Status: AC
  Administered 2017-05-21 – 2017-05-22 (×2): 40 mg via INTRAVENOUS
  Filled 2017-05-21 (×2): qty 4

## 2017-05-21 NOTE — Telephone Encounter (Signed)
Thank you :)

## 2017-05-21 NOTE — Progress Notes (Signed)
[facility] TEAM 1 - [place]  KVQ:[id number] DOB: [birth year] DOA: 05/12/2017 PCP: Jani Gravel, MD    Brief Narrative:  84y.o. F w/ a Hx of CVA, DM2 , HTN, Basal cell carcinoma of breast 1998, COPD, HLD, ITP, Hiatal hernia, and Chronic Osteomyelitis ofLfoot on chronic PO ABx who presented to the ED with c/o R shoulder pain. She called EMS after being unable to get out of bed.  Per EMS initial SBP was 70.  In the ED Trop was 3.5. EKG showed inferior ST depressions.  Significant Events: 4/06 admit 4/8 TTE - EF 40-45% - hypokinesis of mid-apical, anteroseptal, anterior, and apical myocardium c/w ishcemia of LAD - grade 1 DD   Subjective: The patient is resting comfortably in bed.  She is surprisingly alert interactive and oriented.  She denies chest pain shortness of breath fevers chills or abdominal pain.  She does report a low-grade nausea.  She feels very weak in general and has been resistant to attempt to get her out of bed or to work with therapy.  Assessment & Plan:  NSTEMI  Elevated troponin - EKG and TTE c/w LAD disease - Cards unable to cath due to progressive kidney failure - prognosis poor at this time - cont medical tx only for now   Acute ischemic systolic CHF Due to AMI w/ LAD CAD - was now developed volume overload in setting of severe renal failure - diuretic dosing as per Nephrology   Filed Weights   05/19/17 0328 05/20/17 0417 05/21/17 0429  Weight: 89.6 kg (197 lb 8.5 oz) 87.8 kg (193 lb 9 oz) 87.7 kg (193 lb 5.5 oz)    Nonoliguric Acute kidney failure   crt now climbing again - ?ATN due to hypotension in setting of LV infarct + use of ARB (prior to admit) - placed foley to assure bladder is fully and consistently decompressed and to allow accurate Is/Os - stopped Vanc - avoid potential nephrotoxins - renal US w/o acute findings/hydro - Nephrology following -fortunately the patient's potassium and bicarbonate remain stable and she is not yet  floridly uremic or critically volume overloaded  Recent Labs  Lab 05/18/17 0419 05/19/17 0323 05/19/17 1623 05/20/17 0446 05/21/17 0317  CREATININE 6.29* 6.66* 6.97* 6.90* 7.33*    L LE edema out of proportion to R W/ chronic ankle/foot osteo this is worrisome - pt refused MRI for eval - doppler w/o evidence of DVT   Altered mental status  CT head noted chronic moderate small vessel disease w/ atrophy - suspect there is a component of baseline dementia - likely now an element of uremia at play as well   Chronic osteomyelitis L foot Change back to usual home oral abx regimen - followed by Dr. Megan Salon in [facility] - exam w/o signif change   DM 2 controlled with complication 6/43 P2R 6.8 - CBG controlled at this time   HTN BP controlled   COPD Well compensated  DVT prophylaxis: SQ heparin  Code Status: FULL CODE Family Communication: no family present at time of exam  Disposition Plan: SDU   Consultants:  Cardiology  Nephrology   Antimicrobials:  Augmentin > Doxycycline >  Objective: Blood pressure 138/74, pulse 81, temperature 98.8 F (37.1 C), temperature source Oral, resp. rate 16, height 5\' 4"  (1.626 m), weight 87.7 kg (193 lb 5.5 oz), SpO2 95 %.  Intake/Output Summary (Last 24 hours) at 05/21/2017 1333 Last data filed at 05/21/2017 1100 Gross per 24 hour  Intake 596 ml  Output 2100 ml  Net -1504 ml   Filed Weights   05/19/17 0328 05/20/17 0417 05/21/17 0429  Weight: 89.6 kg (197 lb 8.5 oz) 87.8 kg (193 lb 9 oz) 87.7 kg (193 lb 5.5 oz)    Examination: General: No acute respiratory distress - alert & O Lungs: poor air movement in bases B - no wheezing  Cardiovascular: JVD+ - RRR  Extremities: L LE w/ deformity at ankle - limited ability to move L foot - 3++ edema L LE w/o signif change - 2+ edema R LE   CBC: Recent Labs  Lab 05/19/17 0323 05/20/17 0446 05/21/17 0317  WBC 10.2 9.7 8.2  HGB 9.5* 9.9* 9.2*  HCT 28.9* 29.8* 27.6*  MCV 83.5 83.7  83.6  PLT 293 340 518   Basic Metabolic Panel: Recent Labs  Lab 05/16/17 0311 05/17/17 0313 05/18/17 0419 05/19/17 0323 05/19/17 1623 05/20/17 0446 05/21/17 0317  NA 134* 133* 132* 132* 135 136 136  K 4.7 5.2* 5.6* 5.8* 5.6* 4.9 4.9  CL 102 104 103 102 104 104 103  CO2 19* 18* 18* 19* 18* 18* 19*  GLUCOSE 127* 148* 136* 124* 135* 139* 121*  BUN 47* 57* 74* 87* 94* 96* 105*  CREATININE 4.93* 5.78* 6.29* 6.66* 6.97* 6.90* 7.33*  CALCIUM 7.8* 7.8* 8.3* 8.5* 8.8* 8.6* 8.4*  MG 1.9 1.8 1.7  --   --   --   --   PHOS 5.1*  --  6.8* 7.5*  --   --   --    GFR: Estimated Creatinine Clearance: 5.9 mL/min (A) (by C-G formula based on SCr of 7.33 mg/dL (H)).  Liver Function Tests: Recent Labs  Lab 05/17/17 0313 05/18/17 0419 05/19/17 0323 05/21/17 0317  AST 144*  --   --  25  ALT 127*  --   --  60*  ALKPHOS 73  --   --  56  BILITOT 0.6  --   --  0.6  PROT 5.0*  --   --  5.1*  ALBUMIN 1.9* 1.8* 1.9* 1.8*     Cardiac Enzymes: Recent Labs  Lab 05/17/17 0313 05/21/17 0317  CKTOTAL 3,339* 114    HbA1C: Hgb A1c MFr Bld  Date/Time Value Ref Range Status  04/17/2017 11:46 AM 6.8 (H) <5.7 % of total Hgb Final    Comment:    For someone without known diabetes, a hemoglobin A1c value of 6.5% or greater indicates that they may have  diabetes and this should be confirmed with a follow-up  test. . For someone with known diabetes, a value <7% indicates  that their diabetes is well controlled and a value  greater than or equal to 7% indicates suboptimal  control. A1c targets should be individualized based on  duration of diabetes, age, comorbid conditions, and  other considerations. . Currently, no consensus exists regarding use of hemoglobin A1c for diagnosis of diabetes for children. Marland Kitchen   10/20/2014 02:17 PM 7.2 (H) 4.8 - 5.6 % Final    Comment:    (NOTE)         Pre-diabetes: 5.7 - 6.4         Diabetes: >6.4         Glycemic control for adults with diabetes: <7.0       CBG: Recent Labs  Lab 05/20/17 1119 05/20/17 1632 05/20/17 2135 05/21/17 0606 05/21/17 1212  GLUCAP 160* 125* 156* 123* 160*    Recent Results (from the past 240 hour(s))  Blood culture (routine x 2)     Status: None   Collection Time: 05/12/17 11:26 PM  Result Value Ref Range Status   Specimen Description   Final    RIGHT ANTECUBITAL Performed at [facility] [address], [place], [place] [address]    Special Requests   Final    BOTTLES DRAWN AEROBIC AND ANAEROBIC Blood Culture adequate volume Performed at [facility] [address] [place], [place] [address]    Culture   Final    NO GROWTH 5 DAYS Performed at [facility], [place] [address], [place], [place] [address]    Report Status 05/18/2017 FINAL  Final  Blood culture (routine x 2)     Status: None   Collection Time: 05/12/17 11:31 PM  Result Value Ref Range Status   Specimen Description   Final    BLOOD LEFT HAND Performed at [facility] [address] [place], [place] [address]    Special Requests   Final    BOTTLES DRAWN AEROBIC AND ANAEROBIC Blood Culture adequate volume Performed at [facility] [address], [place], [place] [address]    Culture   Final    NO GROWTH 5 DAYS Performed at [facility], [place] [address] [place], [place] [address]    Report Status 05/18/2017 FINAL  Final  MRSA PCR Screening     Status: None   Collection Time: 05/14/17  5:59 AM  Result Value Ref Range Status   MRSA by PCR NEGATIVE NEGATIVE Final    Comment:        The GeneXpert MRSA Assay (FDA approved for NASAL specimens only), is one component of a comprehensive MRSA colonization surveillance program. It is not intended to diagnose MRSA infection nor to guide or monitor treatment for MRSA infections. Performed at [facility], [place] [address] [place], [place] [address]      Scheduled Meds: .  amoxicillin-clavulanate  500 mg Oral Daily  . aspirin  81 mg Oral QPC breakfast  . carvedilol  3.125 mg Oral BID WC  . cholecalciferol  2,000 Units Oral Daily  . doxycycline  100 mg Oral BID  . heparin injection (subcutaneous)  5,000 Units Subcutaneous Q8H  . insulin aspart  0-5 Units Subcutaneous QHS  . insulin aspart  0-9 Units Subcutaneous TID WC  . nitroGLYCERIN  0.2 mg Transdermal Daily  . omega-3 acid ethyl esters  1,000 mg Oral Daily  . pantoprazole  40 mg Oral QPC breakfast  . senna-docusate  1 tablet Oral BID      LOS: 8 days   Cherene Altes, MD [facility] Office  [phone] Pager - Text Page per Amion as per below:  On-Call/Text Page:      [email or web address]      password [email or web address]  If 7PM-7AM, please contact night-coverage [email or web address] Password [email or web address] 05/21/2017, 1:33 PM

## 2017-05-21 NOTE — Telephone Encounter (Signed)
I spoke with Dr. Thereasa Solo she is here with an MI and kidney problems. INfection seemed to be 1 of the main issues at present and he is continuing on the chronic antibiotics Dr. Megan Salon had prescribed before.  He will recheck out to [place] if he needs our help.

## 2017-05-21 NOTE — Progress Notes (Signed)
Patient ID: Chelsea [name], female   DOB: [birth year], 84 y.o.   MRN: [id number] [facility] Progress Note   Assessment/ Plan:   1.  Acute kidney injury: This appears to be most likely ATN versus pigment induced nephropathy based on her presentation with relative hypotension as well as exposure to vancomycin/Zosyn-this was corroborated by her fraction excretion of sodium.  With good urine output overnight; slightly uptrending cr but overall essentially unchanged eGFR.  No indications for dialysis at this point.  2.  Acute coronary syndrome/non-ST elevation MI: Ongoing medical management with low-dose beta-blocker/heparin drip, continue to avoid hypotension.  Intermittent lasix dosing.  3.  Osteomyelitis left foot:  Continued on her outpatient doses of doxycycline/Augmentin.  I suspect she will need an MRI of the left foot/ankle has ecchymosis/erythema worsening--> she has refused saying that she can't lay still for long enough to get scan. 4.  Acute exacerbation of systolic/diastolic congestive heart failure: Suspect that her significant hypoalbuminemia is contributing to third spacing/edema and possibly diuretic resistance-intermittently dosing her with furosemide given evidence of volume excess.  Subjective:    Pt reports feeling better but L leg swelling continues.  Dopplers are negative but not all portions able to be visualized.    Objective:   BP 138/74 (BP Location: Left Arm)   Pulse 81   Temp 98.8 F (37.1 C) (Oral)   Resp 16   Ht '5\' 4"'  (1.626 m)   Wt 87.7 kg (193 lb 5.5 oz)   SpO2 95%   BMI 33.19 kg/m   Intake/Output Summary (Last 24 hours) at 05/21/2017 1346 Last data filed at 05/21/2017 1100 Gross per 24 hour  Intake 596 ml  Output 2100 ml  Net -1504 ml   Weight change: -0.1 kg (-3.5 oz)  Physical Exam: Gen: Comfortably resting in bed, no asterixis, eating lunch CVS: Pulse regular rhythm, normal rate, S1 and S2 normal Resp: Decreased breath sounds over lower  fields, no frank crackles otherwise clear to auscultation, no rhonchi Abd: Soft, flat, nontender Ext: Left ankle ecchymosis/erythema noted, 2+ edema, right leg trace-1+ edema.   Imaging: No results found.  Labs: BMET Recent Labs  Lab 05/16/17 0311 05/17/17 0313 05/18/17 0419 05/19/17 0323 05/19/17 1623 05/20/17 0446 05/21/17 0317  NA 134* 133* 132* 132* 135 136 136  K 4.7 5.2* 5.6* 5.8* 5.6* 4.9 4.9  CL 102 104 103 102 104 104 103  CO2 19* 18* 18* 19* 18* 18* 19*  GLUCOSE 127* 148* 136* 124* 135* 139* 121*  BUN 47* 57* 74* 87* 94* 96* 105*  CREATININE 4.93* 5.78* 6.29* 6.66* 6.97* 6.90* 7.33*  CALCIUM 7.8* 7.8* 8.3* 8.5* 8.8* 8.6* 8.4*  PHOS 5.1*  --  6.8* 7.5*  --   --   --    CBC Recent Labs  Lab 05/18/17 0419 05/19/17 0323 05/20/17 0446 05/21/17 0317  WBC 9.3 10.2 9.7 8.2  HGB 9.7* 9.5* 9.9* 9.2*  HCT 29.8* 28.9* 29.8* 27.6*  MCV 84.7 83.5 83.7 83.6  PLT 293 293 340 348    Medications:    . amoxicillin-clavulanate  500 mg Oral Daily  . aspirin  81 mg Oral QPC breakfast  . carvedilol  3.125 mg Oral BID WC  . cholecalciferol  2,000 Units Oral Daily  . doxycycline  100 mg Oral BID  . heparin injection (subcutaneous)  5,000 Units Subcutaneous Q8H  . insulin aspart  0-5 Units Subcutaneous QHS  . insulin aspart  0-9 Units Subcutaneous TID WC  . nitroGLYCERIN  0.2 mg Transdermal Daily  . omega-3 acid ethyl esters  1,000 mg Oral Daily  . pantoprazole  40 mg Oral QPC breakfast  . senna-docusate  1 tablet Oral BID   Madelon Lips, MD [phone] 05/21/2017, 1:46 PM

## 2017-05-21 NOTE — Telephone Encounter (Signed)
No problem.

## 2017-05-21 NOTE — Evaluation (Signed)
Physical Therapy Evaluation Patient Details Name: Chelsea [name] MRN: [id number] DOB: 1928-03-13 Today's Date: 05/21/2017   History of Present Illness  84y.o. F w/ a Hx of CVA, DM2 , HTN, Basal cell carcinoma of breast 1998, COPD, HLD, ITP, Hiatal hernia, and Chronic Osteomyelitis of L foot on chronic PO ABx who presented to the ED with c/o R shoulder pain.  She called EMS after being unable to get out of bed. Admitted 05/12/17 with NSTEMI  Clinical Impression  Pt presents requiring mod A for mobility, unable to tolerate transfers or gait due to Lt LE pain with wt bearing and decreased strength. Pt able to tolerate sitting edge of bed x 10 minutes.  Pt will benefit from skilled PT services to increase strength and decrease burden of care for recommended d/c to SNF.     Follow Up Recommendations SNF    Equipment Recommendations  None recommended by PT    Recommendations for Other Services OT consult     Precautions / Restrictions Precautions Precautions: Fall Restrictions Weight Bearing Restrictions: No      Mobility  Bed Mobility Overal bed mobility: Needs Assistance Bed Mobility: Supine to Sit;Sit to Supine     Supine to sit: Mod assist Sit to supine: Mod assist   General bed mobility comments: mod A for lifting at trunk and for Lt LE due to pain/weakness  Transfers                 General transfer comment: pt refuses to attempt transfers at this time, LT LE too painful to bear wt  Ambulation/Gait                Stairs            Wheelchair Mobility    Modified Rankin (Stroke Patients Only)       Balance Overall balance assessment: Needs assistance   Sitting balance-Leahy Scale: Fair Sitting balance - Comments: pt able to sit edge of bed x 10 minutes.  pt prequires 1 UE support to maintain sitting balance due to c/o "weakness".  in sitting pt able to comb hair and wash face with close supervision for balanc.e pt requires assist to comb back of  head due to decreased shoulder ROM                                     Pertinent Vitals/Pain Pain Assessment: Faces Faces Pain Scale: Hurts whole lot Pain Location: Lt ankle/shin weith movement or light touch Pain Descriptors / Indicators: Burning;Aching;Sharp Pain Intervention(s): Limited activity within patient's tolerance;Monitored during session;Repositioned    Home Living Family/patient expects to be discharged to:: Private residence Living Arrangements: Children Available Help at Discharge: Family;Available PRN/intermittently Type of Home: House Home Access: Stairs to enter Entrance Stairs-Rails: None Entrance Stairs-Number of Steps: 1 Home Layout: One level        Prior Function Level of Independence: Independent with assistive device(s)         Comments: pt reports she used [facility] and was able to get around house including cooking and cleaning without assistance     Hand Dominance        Extremity/Trunk Assessment   Upper Extremity Assessment Upper Extremity Assessment: Generalized weakness(decreased ROM Lt shoulder)    Lower Extremity Assessment Lower Extremity Assessment: Generalized weakness;LLE deficits/detail LLE Deficits / Details: ankle 0/5  PF/DF, max edema and discoloration of Lt foot, ankle and  lower shin    Cervical / Trunk Assessment Cervical / Trunk Assessment: Kyphotic  Communication   Communication: HOH  Cognition Arousal/Alertness: Awake/alert Behavior During Therapy: WFL for tasks assessed/performed Overall Cognitive Status: Within Functional Limits for tasks assessed                                        General Comments      Exercises     Assessment/Plan    PT Assessment Patient needs continued PT services  PT Problem List Decreased strength;Decreased mobility;Decreased range of motion;Decreased activity tolerance;Decreased balance;Decreased knowledge of use of DME;Impaired  sensation;Pain;Cardiopulmonary status limiting activity       PT Treatment Interventions DME instruction;Therapeutic activities;Gait training;Therapeutic exercise;Patient/family education;Stair training;Balance training;Wheelchair mobility training;Neuromuscular re-education;Functional mobility training    PT Goals (Current goals can be found in the Care Plan section)  Acute Rehab PT Goals Patient Stated Goal: get stronger PT Goal Formulation: With patient Time For Goal Achievement: 06/04/17 Potential to Achieve Goals: Good    Frequency Min 2X/week   Barriers to discharge Inaccessible home environment;Decreased caregiver support pt stays alone most of the day    Co-evaluation               AM-PAC PT "6 Clicks" Daily Activity  Outcome Measure Difficulty turning over in bed (including adjusting bedclothes, sheets and blankets)?: A Lot Difficulty moving from lying on back to sitting on the side of the bed? : A Lot Difficulty sitting down on and standing up from a chair with arms (e.g., wheelchair, bedside commode, etc,.)?: Unable Help needed moving to and from a bed to chair (including a wheelchair)?: Total Help needed walking in hospital room?: Total Help needed climbing 3-5 steps with a railing? : Total 6 Click Score: 8    End of Session   Activity Tolerance: Patient limited by fatigue;Patient limited by pain Patient left: in bed;with bed alarm set;with call bell/phone within reach Nurse Communication: Mobility status PT Visit Diagnosis: Muscle weakness (generalized) (M62.81);Difficulty in walking, not elsewhere classified (R26.2);Pain Pain - Right/Left: Left Pain - part of body: Leg    Time: 1045-1110 PT Time Calculation (min) (ACUTE ONLY): 25 min   Charges:   PT Evaluation $PT Eval Moderate Complexity: 1 Mod PT Treatments $Therapeutic Activity: 8-22 mins   PT G Codes:        Chelsea [name], PT, DPT  Chelsea [name] 05/21/2017, 11:25 AM

## 2017-05-21 NOTE — Evaluation (Signed)
Occupational Therapy Evaluation Patient Details Name: Chelsea [name] MRN: [id number] DOB: 08/11/1928 Today's Date: 05/21/2017    History of Present Illness 84y.o. F w/ a Hx of CVA, DM2 , HTN, Basal cell carcinoma of breast 1998, COPD, HLD, ITP, Hiatal hernia, and Chronic Osteomyelitis of L foot on chronic PO ABx who presented to the ED with c/o R shoulder pain.  She called EMS after being unable to get out of bed. Admitted 05/12/17 with NSTEMI   Clinical Impression   Pt admitted with the above diagnosis and has the deficits listed below. Pt would benefit from cont OT to increase independence with basic adls and adl transfers to attempt to live at home with daughter after rehab. Pt is currently unable to get assist from daughter although she is in the home so feel this may be possible after subacute rehab.  Will continue to follow and encouraged nursing to get pt up each day to increase sitting tolerance for adls.    Follow Up Recommendations  SNF;Supervision/Assistance - 24 hour    Equipment Recommendations  3 in 1 bedside commode;Tub/shower bench    Recommendations for Other Services       Precautions / Restrictions Precautions Precautions: Fall Restrictions Weight Bearing Restrictions: No Other Position/Activity Restrictions: pt has difficulty bearing weight through L ankle.      Mobility Bed Mobility Overal bed mobility: Needs Assistance Bed Mobility: Supine to Sit;Sit to Supine     Supine to sit: Mod assist Sit to supine: Mod assist   General bed mobility comments: mod A for lifting at trunk and for Lt LE due to pain/weakness  Transfers Overall transfer level: Needs assistance               General transfer comment: pt refuses to attempt transfers at this time, LT LE too painful to bear wt    Balance Overall balance assessment: Needs assistance Sitting-balance support: Feet supported Sitting balance-Leahy Scale: Fair Sitting balance - Comments: Pt fatigues  quickly while on EOB       Standing balance comment: pt unable to stand                           ADL either performed or assessed with clinical judgement   ADL Overall ADL's : Needs assistance/impaired Eating/Feeding: Set up;Sitting   Grooming: Oral care;Brushing hair;Wash/dry face;Wash/dry hands;Minimal assistance;Sitting Grooming Details (indicate cue type and reason): Pt could only tolerate sitting on EOB to groom for about 5 minutes before fatiguing. Upper Body Bathing: Moderate assistance;Sitting   Lower Body Bathing: Total assistance;Sit to/from stand;+2 for physical assistance Lower Body Bathing Details (indicate cue type and reason): Pt unable to tolerate standing and too fatigued to bathe on EOB In sitting Upper Body Dressing : Moderate assistance;Sitting   Lower Body Dressing: Total assistance;+2 for physical assistance;Sit to/from Health and safety inspector Details (indicate cue type and reason): deferred transfers due to ankle pain. Talked to pt about doing a lateral scoot transfer to chair, but she continued to decline due to pain. Pt may benefit from drop arm BSC.  Toileting- Clothing Manipulation and Hygiene: Total assistance;+2 for physical assistance;Bed level         General ADL Comments: Pt not able to tolerate adls on EOB for long before fatiguing. Talked to pt at length about doing some adls in bed just to keep her strength up. Feel pt has been in bed all week and has not been  up at all.     Vision Patient Visual Report: No change from baseline Vision Assessment?: No apparent visual deficits     Perception     Praxis      Pertinent Vitals/Pain Pain Assessment: Faces Faces Pain Scale: Hurts whole lot Pain Location: Lt ankle/shin weith movement or light touch Pain Descriptors / Indicators: Burning;Aching;Sharp Pain Intervention(s): Limited activity within patient's tolerance;Monitored during session;Repositioned;Premedicated before session      Hand Dominance Right   Extremity/Trunk Assessment Upper Extremity Assessment Upper Extremity Assessment: LUE deficits/detail LUE Deficits / Details: shoulder ROM to 90 degrees of flexion only due to pain.  Otherwise WFL. LUE: Unable to fully assess due to pain   Lower Extremity Assessment Lower Extremity Assessment: Defer to PT evaluation LLE Deficits / Details: ankle 0/5  PF/DF, max edema and discoloration of Lt foot, ankle and lower shin   Cervical / Trunk Assessment Cervical / Trunk Assessment: Kyphotic   Communication Communication Communication: HOH   Cognition Arousal/Alertness: Awake/alert Behavior During Therapy: WFL for tasks assessed/performed Overall Cognitive Status: Within Functional Limits for tasks assessed                                     General Comments  Pt wtih edematous/red LLE.     Exercises     Shoulder Instructions      Home Living Family/patient expects to be discharged to:: Private residence Living Arrangements: Children Available Help at Discharge: Family;Available PRN/intermittently Type of Home: House Home Access: Stairs to enter [employer] of Steps: 1 Entrance Stairs-Rails: None Home Layout: One level     Bathroom Shower/Tub: Tub/shower unit;Curtain   Biochemist, clinical: Standard     Home Equipment: Cane - single point   Additional Comments: Pt may need tub bench and 3:1 commode      Prior Functioning/Environment Level of Independence: Independent with assistive device(s)        Comments: pt reports she used [facility] and was able to get around house including cooking and cleaning without assistance        OT Problem List: Decreased strength;Decreased range of motion;Decreased activity tolerance;Impaired balance (sitting and/or standing);Decreased knowledge of use of DME or AE;Obesity;Impaired UE functional use;Pain;Increased edema      OT Treatment/Interventions: Self-care/ADL  training;Therapeutic activities;DME and/or AE instruction    OT Goals(Current goals can be found in the care plan section) Acute Rehab OT Goals Patient Stated Goal: get stronger OT Goal Formulation: With patient Time For Goal Achievement: 06/04/17 Potential to Achieve Goals: Good ADL Goals Pt Will Perform Grooming: sitting;with supervision Pt Will Perform Lower Body Bathing: with min assist;sit to/from stand Pt Will Perform Lower Body Dressing: with min assist;sit to/from stand;with adaptive equipment Pt Will Transfer to Toilet: with min assist;stand pivot transfer;bedside commode Additional ADL Goal #1: Pt will compete standing task at sink for 4 mintues with min assist for balance to increase ability to do adls in standing.  OT Frequency: Min 2X/week   Barriers to D/C: Decreased caregiver support  pt is alone during the day       Co-evaluation              AM-PAC PT "6 Clicks" Daily Activity     Outcome Measure Help from another person eating meals?: None Help from another person taking care of personal grooming?: A Little Help from another person toileting, which includes using toliet, bedpan, or urinal?: Total Help from  another person bathing (including washing, rinsing, drying)?: A Lot Help from another person to put on and taking off regular upper body clothing?: A Lot Help from another person to put on and taking off regular lower body clothing?: A Lot 6 Click Score: 14   End of Session Nurse Communication: Mobility status  Activity Tolerance: Patient limited by pain;Patient limited by fatigue Patient left: in bed;with call bell/phone within reach;with bed alarm set  OT Visit Diagnosis: Unsteadiness on feet (R26.81);Muscle weakness (generalized) (M62.81);Pain Pain - Right/Left: Left Pain - part of body: Leg                Time: 5027-7412 OT Time Calculation (min): 26 min Charges:  OT General Charges $OT Visit: 1 Visit OT Evaluation $OT Eval Moderate  Complexity: 1 Mod OT Treatments $Self Care/Home Management : 8-22 mins G-Codes:     Jinger Neighbors, OTR/L [phone]  Glenford Peers 05/21/2017, 1:02 PM

## 2017-05-22 LAB — BASIC METABOLIC PANEL
Anion gap: 14 (ref 5–15)
BUN: 117 mg/dL — ABNORMAL HIGH (ref 6–20)
CALCIUM: 8.6 mg/dL — AB (ref 8.9–10.3)
CO2: 18 mmol/L — ABNORMAL LOW (ref 22–32)
CREATININE: 7.11 mg/dL — AB (ref 0.44–1.00)
Chloride: 105 mmol/L (ref 101–111)
GFR calc Af Amer: 5 mL/min — ABNORMAL LOW (ref >60)
GFR calc non Af Amer: 5 mL/min — ABNORMAL LOW (ref >60)
Glucose, Bld: 123 mg/dL — ABNORMAL HIGH (ref 65–99)
Potassium: 5.1 mmol/L (ref 3.5–5.1)
SODIUM: 137 mmol/L (ref 135–145)

## 2017-05-22 LAB — GLUCOSE, CAPILLARY
GLUCOSE-CAPILLARY: 140 mg/dL — AB (ref 65–99)
GLUCOSE-CAPILLARY: 114 mg/dL — AB (ref 65–99)
Glucose-Capillary: 148 mg/dL — ABNORMAL HIGH (ref 65–99)
Glucose-Capillary: 133 mg/dL — ABNORMAL HIGH (ref 65–99)

## 2017-05-22 LAB — CBC
HCT: 29.5 % — ABNORMAL LOW (ref 36.0–46.0)
Hemoglobin: 9.7 g/dL — ABNORMAL LOW (ref 12.0–15.0)
MCH: 27.5 pg (ref 26.0–34.0)
MCHC: 32.9 g/dL (ref 30.0–36.0)
MCV: 83.6 fL (ref 78.0–100.0)
PLATELETS: 350 10*3/uL (ref 150–400)
RBC: 3.53 MIL/uL — ABNORMAL LOW (ref 3.87–5.11)
RDW: 15.4 % (ref 11.5–15.5)
WBC: 7.4 10*3/uL (ref 4.0–10.5)

## 2017-05-22 MED ORDER — FENTANYL CITRATE (PF) 100 MCG/2ML IJ SOLN
25.0000 ug | Freq: Once | INTRAMUSCULAR | Status: AC | PRN
  Administered 2017-05-23: 25 ug via INTRAVENOUS
  Filled 2017-05-22: qty 2

## 2017-05-22 NOTE — Plan of Care (Signed)
  Problem: Elimination: Goal: Will not experience complications related to bowel motility Outcome: Progressing   Problem: Clinical Measurements: Goal: Will remain free from infection Outcome: Not Progressing   Problem: Elimination: Goal: Will not experience complications related to urinary retention Outcome: Not Progressing   Problem: Clinical Measurements: Goal: Ability to avoid or minimize complications of infection will improve Outcome: Not Progressing

## 2017-05-22 NOTE — Care Management Important Message (Signed)
Important Message  Patient Details  Name: Chelsea [name] MRN: [id number] Date of Birth: Nov 17, 1928   Medicare Important Message Given:  Yes    Tilak Oakley P [place] 05/22/2017, 2:11 PM

## 2017-05-22 NOTE — Clinical Social Work Note (Signed)
Clinical Social Work Assessment  Patient Details  Name: Chelsea [name] MRN: [id number] Date of Birth: 1928-08-14  Date of referral:  05/22/17               Reason for consult:  Discharge Planning, Facility Placement                Permission sought to share information with:  Family Supports Permission granted to share information::  Yes, Verbal Permission Granted  Name::     Santiago Bur, [phone]  Agency::  snf  Relationship::  daughter  Contact Information:  [phone]  Housing/Transportation Living arrangements for the past 2 months:  Single Family Home Source of Information:  Patient Patient Interpreter Needed:  None Criminal Activity/Legal Involvement Pertinent to Current Situation/Hospitalization:  No - Comment as needed Significant Relationships:  Adult Children Lives with:  Adult Children Do you feel safe going back to the place where you live?  Yes Need for family participation in patient care:  Yes (Comment)  Care giving concerns: No family at bedside. Patient stated she has been living with daughter and son in law fr 10 years. Patient stated that family is supportive but stated daughter is sick herself and will be unable to offer the support patient needs at this time    Facilities manager / plan:  CSW met patient at bedside to offer support and discharge need. Patient stated she will be getting an MRI today but stated she is nervous because its going to hurt. CSW spoke with patient about her anxiety towards the MRI. Patient stated she knows it is needed and will go ahead and do it. Patient stated she is agreeable to discharge to rehab facility to be able to get her strength back up   Employment status:  Retired Forensic scientist:  Medicare PT Recommendations:  [place] / Referral to community resources:  [place]  Patient/Family's Response to care:  Patient wanting to get back to her baseline and be able to to  independent again   Patient/Family's Understanding of and Emotional Response to Diagnosis, Current Treatment, and Prognosis:  Patient agreeable to go to rehab  Emotional Assessment Appearance:  Appears stated age Attitude/Demeanor/Rapport:  Engaged Affect (typically observed):  Accepting Orientation:  Oriented to Self, Oriented to Situation, Oriented to Place, Oriented to  Time Alcohol / Substance use:  Not Applicable Psych involvement (Current and /or in the community):  No (Comment)  Discharge Needs  Concerns to be addressed:  Care Coordination Readmission within the last 30 days:  No Current discharge risk:    Barriers to Discharge:  Continued Medical Work up   [employer], [place] 05/22/2017, 12:23 PM

## 2017-05-22 NOTE — Progress Notes (Signed)
[facility] TEAM 1 - [place]  MWU:[id number] DOB: 1928-04-17 DOA: 05/12/2017 PCP: Jani Gravel, MD    Brief Narrative:  84y.o. F w/ a Hx of CVA, DM2 , HTN, Basal cell carcinoma of breast 1998, COPD, HLD, ITP, Hiatal hernia, and Chronic Osteomyelitis ofLfoot on chronic PO ABx who presented to the ED with c/o R shoulder pain. She called EMS after being unable to get out of bed.  Per EMS initial SBP was 70.  In the ED Trop was 3.5. EKG showed inferior ST depressions.  Significant Events: 4/06 admit 4/8 TTE - EF 40-45% - hypokinesis of mid-apical, anteroseptal, anterior, and apical myocardium c/w ishcemia of LAD - grade 1 DD   Subjective: The patient tells me she still feels "achy."  She has agreed to an MRI of her left foot/ankle.  She denies chest pain or shortness of breath at this time.  She does report low-grade continuous nausea but no vomiting.  Assessment & Plan:  NSTEMI  Elevated troponin - EKG and TTE c/w LAD disease - Cards unable to cath due to progressive kidney failure - prognosis poor at this time - cont medical tx only for now - consider cath if renal function normalizes   Acute ischemic systolic CHF Due to AMI w/ LAD CAD - was now developed volume overload in setting of severe renal failure - diuretic dosing as per Nephrology - appears somewhat less edematous today    Filed Weights   05/20/17 0417 05/21/17 0429 05/22/17 0541  Weight: 87.8 kg (193 lb 9 oz) 87.7 kg (193 lb 5.5 oz) 86.3 kg (190 lb 4.1 oz)    Nonoliguric Acute kidney failure   Felt to be ATN due to hypotension in setting of LV infarct + use of ARB (prior to admit) - placed foley to assure bladder is fully and consistently decompressed and to allow accurate Is/Os - stopped Vanc - avoid potential nephrotoxins - renal US w/o acute findings/hydro - Nephrology following - fortunately the patient's potassium and bicarbonate remain stable and she is not yet floridly uremic or critically  volume overloaded - cont to follow trend   Recent Labs  Lab 05/19/17 0323 05/19/17 1623 05/20/17 0446 05/21/17 0317 05/22/17 0427  CREATININE 6.66* 6.97* 6.90* 7.33* 7.11*    L LE edema out of proportion to R W/ chronic ankle/foot osteo this is worrisome - pt refused MRI initially but now agrees - have ordered for today - she is not presently fit for the OR even if an operable condition is discovered - doppler w/o evidence of DVT - cont usual abx   Altered mental status  CT head noted chronic moderate small vessel disease w/ atrophy - suspect there is a component of baseline dementia - likely now an element of uremia at play as well   Chronic osteomyelitis L foot Changed back to usual home oral abx regimen - followed by Dr. Megan Salon in [facility] - exam w/o signif change - for MRI today to eval   DM 2 controlled with complication 7/25 D6U 6.8 - CBG controlled at this time   HTN BP controlled   COPD Well compensated  DVT prophylaxis: SQ heparin  Code Status: FULL CODE Family Communication: no family present at time of exam  Disposition Plan: SDU   Consultants:  Cardiology  Nephrology   Antimicrobials:  Augmentin > Doxycycline >  Objective: Blood pressure (!) 146/75, pulse 68, temperature 98.3 F (36.8 C), temperature source Oral, resp. rate Marland Kitchen)  24, height 5\' 4"  (1.626 m), weight 86.3 kg (190 lb 4.1 oz), SpO2 98 %.  Intake/Output Summary (Last 24 hours) at 05/22/2017 1353 Last data filed at 05/22/2017 1223 Gross per 24 hour  Intake 634 ml  Output 2275 ml  Net -1641 ml   Filed Weights   05/20/17 0417 05/21/17 0429 05/22/17 0541  Weight: 87.8 kg (193 lb 9 oz) 87.7 kg (193 lb 5.5 oz) 86.3 kg (190 lb 4.1 oz)    Examination: General: No acute respiratory distress - oriented and conversant - quite pleasant  Lungs: poor air movement in bases B w/o change - no wheezing  Cardiovascular: RRR - no rub or M noted  Extremities: L LE w/ deformity at ankle - limited  ability to move L foot - 2++ edema L LE (improved) - 2+ edema R LE   CBC: Recent Labs  Lab 05/20/17 0446 05/21/17 0317 05/22/17 0427  WBC 9.7 8.2 7.4  HGB 9.9* 9.2* 9.7*  HCT 29.8* 27.6* 29.5*  MCV 83.7 83.6 83.6  PLT 340 348 419   Basic Metabolic Panel: Recent Labs  Lab 05/16/17 0311 05/17/17 0313 05/18/17 0419 05/19/17 0323  05/20/17 0446 05/21/17 0317 05/22/17 0427  NA 134* 133* 132* 132*   < > 136 136 137  K 4.7 5.2* 5.6* 5.8*   < > 4.9 4.9 5.1  CL 102 104 103 102   < > 104 103 105  CO2 19* 18* 18* 19*   < > 18* 19* 18*  GLUCOSE 127* 148* 136* 124*   < > 139* 121* 123*  BUN 47* 57* 74* 87*   < > 96* 105* 117*  CREATININE 4.93* 5.78* 6.29* 6.66*   < > 6.90* 7.33* 7.11*  CALCIUM 7.8* 7.8* 8.3* 8.5*   < > 8.6* 8.4* 8.6*  MG 1.9 1.8 1.7  --   --   --   --   --   PHOS 5.1*  --  6.8* 7.5*  --   --   --   --    < > = values in this interval not displayed.   GFR: Estimated Creatinine Clearance: 6 mL/min (A) (by C-G formula based on SCr of 7.11 mg/dL (H)).  Liver Function Tests: Recent Labs  Lab 05/17/17 0313 05/18/17 0419 05/19/17 0323 05/21/17 0317  AST 144*  --   --  25  ALT 127*  --   --  60*  ALKPHOS 73  --   --  56  BILITOT 0.6  --   --  0.6  PROT 5.0*  --   --  5.1*  ALBUMIN 1.9* 1.8* 1.9* 1.8*     Cardiac Enzymes: Recent Labs  Lab 05/17/17 0313 05/21/17 0317  CKTOTAL 3,339* 114    HbA1C: Hgb A1c MFr Bld  Date/Time Value Ref Range Status  04/17/2017 11:46 AM 6.8 (H) <5.7 % of total Hgb Final    Comment:    For someone without known diabetes, a hemoglobin A1c value of 6.5% or greater indicates that they may have  diabetes and this should be confirmed with a follow-up  test. . For someone with known diabetes, a value <7% indicates  that their diabetes is well controlled and a value  greater than or equal to 7% indicates suboptimal  control. A1c targets should be individualized based on  duration of diabetes, age, comorbid conditions, and    other considerations. . Currently, no consensus exists regarding use of hemoglobin A1c for diagnosis of diabetes for children. Marland Kitchen  10/20/2014 02:17 PM 7.2 (H) 4.8 - 5.6 % Final    Comment:    (NOTE)         Pre-diabetes: 5.7 - 6.4         Diabetes: >6.4         Glycemic control for adults with diabetes: <7.0     CBG: Recent Labs  Lab 05/21/17 1212 05/21/17 1622 05/21/17 2134 05/22/17 0613 05/22/17 1138  GLUCAP 160* 107* 133* 140* 148*    Recent Results (from the past 240 hour(s))  Blood culture (routine x 2)     Status: None   Collection Time: 05/12/17 11:26 PM  Result Value Ref Range Status   Specimen Description   Final    RIGHT ANTECUBITAL Performed at [facility] [address], [place], [place] [address]    Special Requests   Final    BOTTLES DRAWN AEROBIC AND ANAEROBIC Blood Culture adequate volume Performed at [facility] [address] [place], [place] [address]    Culture   Final    NO GROWTH 5 DAYS Performed at [facility], [place] [address] [place], [place] [address]    Report Status 05/18/2017 FINAL  Final  Blood culture (routine x 2)     Status: None   Collection Time: 05/12/17 11:31 PM  Result Value Ref Range Status   Specimen Description   Final    BLOOD LEFT HAND Performed at [facility] [address] [place], [place] [address]    Special Requests   Final    BOTTLES DRAWN AEROBIC AND ANAEROBIC Blood Culture adequate volume Performed at [facility] [address] [place], [place] [address]    Culture   Final    NO GROWTH 5 DAYS Performed at [facility], [place] [address], [place], [place] [address]    Report Status 05/18/2017 FINAL  Final  MRSA PCR Screening     Status: None   Collection Time: 05/14/17  5:59 AM  Result Value Ref Range Status   MRSA by PCR NEGATIVE NEGATIVE Final    Comment:        The GeneXpert MRSA Assay (FDA approved for  NASAL specimens only), is one component of a comprehensive MRSA colonization surveillance program. It is not intended to diagnose MRSA infection nor to guide or monitor treatment for MRSA infections. Performed at [facility], [place] [address] [place], [place] [address]      Scheduled Meds: . amoxicillin-clavulanate  500 mg Oral Daily  . aspirin  81 mg Oral QPC breakfast  . carvedilol  3.125 mg Oral BID WC  . cholecalciferol  2,000 Units Oral Daily  . doxycycline  100 mg Oral BID  . heparin injection (subcutaneous)  5,000 Units Subcutaneous Q8H  . insulin aspart  0-5 Units Subcutaneous QHS  . insulin aspart  0-9 Units Subcutaneous TID WC  . nitroGLYCERIN  0.2 mg Transdermal Daily  . omega-3 acid ethyl esters  1,000 mg Oral Daily  . pantoprazole  40 mg Oral QPC breakfast  . senna-docusate  1 tablet Oral BID      LOS: 9 days   Cherene Altes, MD [facility] Office  [phone] Pager - Text Page per Amion as per below:  On-Call/Text Page:      [email or web address]      password [email or web address]  If 7PM-7AM, please contact night-coverage [email or web address] Password [email or web address] 05/22/2017, 1:53 PM

## 2017-05-22 NOTE — Progress Notes (Signed)
Patient ID: Chelsea [name], female   DOB: Mar 18, 1928, 84 y.o.   MRN: [id number] [facility] Progress Note   Assessment/ Plan:    1.  Acute kidney injury: This appears to be most likely ATN versus pigment induced nephropathy based on her presentation with relative hypotension as well as exposure to vancomycin/Zosyn-this was corroborated by her fraction excretion of sodium. She remains nonoliguric and her Cr has essentially plateaued but no real downtrend yet.  No indications for dialysis and hopefully we will not need to do so.  2.  Acute coronary syndrome/non-ST elevation MI: Ongoing medical management, no LHC due to renal function.  Intermittent lasix dosing.   3.  Osteomyelitis left foot:  Continued on her outpatient doses of doxycycline/Augmentin.  MRI of the left foot/ankle today, will see what that reveals  4.  Acute exacerbation of systolic/diastolic congestive heart failure: Suspect that her significant hypoalbuminemia (1.8) is contributing to third spacing/edema and possibly diuretic resistance-intermittently dosing her with furosemide given evidence of volume excess.  I don't expect all her swelling to subside with that degree of hypoalbuminemia.  Subjective:    Pt willing to be scheduled for MRI but is apprehensive about being in pain.    Objective:   BP (!) 146/75 (BP Location: Right Arm)   Pulse 68   Temp 98.3 F (36.8 C) (Oral)   Resp (!) 24   Ht 5\' 4"  (1.626 m)   Wt 86.3 kg (190 lb 4.1 oz)   SpO2 98%   BMI 32.66 kg/m   Intake/Output Summary (Last 24 hours) at 05/22/2017 1339 Last data filed at 05/22/2017 1223 Gross per 24 hour  Intake 634 ml  Output 2275 ml  Net -1641 ml   Weight change: -1.4 kg (-3 lb 1.4 oz)  Physical Exam: Gen: Comfortably resting in bed, eating soup for lunch. CVS: Pulse regular rhythm, normal rate, S1 and S2 normal Resp: Decreased breath sounds over lower fields, no frank crackles otherwise clear to auscultation, no rhonchi Abd:  Soft, flat, nontender Ext: Left ankle medial deeply violaceous ecchymosis noted, 2+ edema, right leg trace-1+ edema.   Imaging: No results found.  Labs: BMET Recent Labs  Lab 05/16/17 0311 05/17/17 0313 05/18/17 0419 05/19/17 0323 05/19/17 1623 05/20/17 0446 05/21/17 0317 05/22/17 0427  NA 134* 133* 132* 132* 135 136 136 137  K 4.7 5.2* 5.6* 5.8* 5.6* 4.9 4.9 5.1  CL 102 104 103 102 104 104 103 105  CO2 19* 18* 18* 19* 18* 18* 19* 18*  GLUCOSE 127* 148* 136* 124* 135* 139* 121* 123*  BUN 47* 57* 74* 87* 94* 96* 105* 117*  CREATININE 4.93* 5.78* 6.29* 6.66* 6.97* 6.90* 7.33* 7.11*  CALCIUM 7.8* 7.8* 8.3* 8.5* 8.8* 8.6* 8.4* 8.6*  PHOS 5.1*  --  6.8* 7.5*  --   --   --   --    CBC Recent Labs  Lab 05/19/17 0323 05/20/17 0446 05/21/17 0317 05/22/17 0427  WBC 10.2 9.7 8.2 7.4  HGB 9.5* 9.9* 9.2* 9.7*  HCT 28.9* 29.8* 27.6* 29.5*  MCV 83.5 83.7 83.6 83.6  PLT 293 340 348 350    Medications:    . amoxicillin-clavulanate  500 mg Oral Daily  . aspirin  81 mg Oral QPC breakfast  . carvedilol  3.125 mg Oral BID WC  . cholecalciferol  2,000 Units Oral Daily  . doxycycline  100 mg Oral BID  . heparin injection (subcutaneous)  5,000 Units Subcutaneous Q8H  . insulin aspart  0-5  Units Subcutaneous QHS  . insulin aspart  0-9 Units Subcutaneous TID WC  . nitroGLYCERIN  0.2 mg Transdermal Daily  . omega-3 acid ethyl esters  1,000 mg Oral Daily  . pantoprazole  40 mg Oral QPC breakfast  . senna-docusate  1 tablet Oral BID   Madelon Lips, MD [phone] 05/22/2017, 1:39 PM

## 2017-05-22 NOTE — NC FL2 (Signed)
[place] MEDICAID FL2 LEVEL OF CARE SCREENING TOOL     IDENTIFICATION  Patient Name: Chelsea [name] Birthdate: 12-Sep-1928 Sex: female Admission Date (Current Location): 05/12/2017  [facility] and [place] Number:  Herbalist and Address:  [facility]. [facility], [place] [address] [place], [place] [address]      Provider Number: [id number]  Attending Physician Name and Address:  Cherene Altes, MD  Relative Name and Phone Number:  Santiago Bur, [phone]    Current Level of Care: Hospital Recommended Level of Care: [place] Prior Approval Number:    Date Approved/Denied:   PASRR Number: [id number]  Discharge Plan: SNF    Current Diagnoses: Patient Active Problem List   Diagnosis Date Noted  . Pressure injury of skin 05/16/2017  . Acute kidney failure ([place])   . Acute on chronic combined systolic and diastolic CHF (congestive heart failure) ([place])   . NSTEMI (non-ST elevated myocardial infarction) ([place]) 05/13/2017  . HTN (hypertension) 05/13/2017  . Diabetes mellitus ([place]) 04/10/2017  . Venous stasis dermatitis of both lower extremities 04/10/2017  . Neuropathy 04/10/2017  . Chronic osteomyelitis of left foot ([place]) 04/10/2017  . Breast cancer of upper-inner quadrant of right female breast ([place]) 10/05/2014  . Occult blood positive stool 03/24/2014  . History of ITP 04/14/2011    Orientation RESPIRATION BLADDER Height & Weight     Time, Self, Situation, Place  Normal Indwelling catheter, Incontinent Weight: 190 lb 4.1 oz (86.3 kg) Height:  5\' 4"  (162.6 cm)  BEHAVIORAL SYMPTOMS/MOOD NEUROLOGICAL BOWEL NUTRITION STATUS      Continent Diet(heart healthy)  AMBULATORY STATUS COMMUNICATION OF NEEDS Skin   Extensive Assist Verbally                         Personal Care Assistance Level of Assistance  Bathing, Feeding, Dressing Bathing Assistance: Limited assistance Feeding assistance: Independent Dressing Assistance:  Limited assistance     Functional Limitations Info  Sight, Hearing, Speech Sight Info: Adequate Hearing Info: Adequate Speech Info: Adequate    SPECIAL CARE FACTORS FREQUENCY  PT (By licensed PT), OT (By licensed OT)     PT Frequency: 5x wk OT Frequency: 5x wk            Contractures Contractures Info: Not present    Additional Factors Info  Allergies, Code Status Code Status Info: full code Allergies Info: TRAVATAN Z TRAVOPROST (BAK FREE)            Current Medications (05/22/2017):  This is the current hospital active medication list Current Facility-Administered Medications  Medication Dose Route Frequency Provider Last Rate Last Dose  . acetaminophen (TYLENOL) tablet 650 mg  650 mg Oral Q4H PRN Cherene Altes, MD   650 mg at 05/20/17 0350  . amoxicillin-clavulanate (AUGMENTIN) 500-125 MG per tablet 500 mg  500 mg Oral Daily Reginia Naas, RPH   500 mg at 05/21/17 1659  . aspirin chewable tablet 81 mg  81 mg Oral QPC breakfast Etta Quill, DO   81 mg at 05/22/17 0827  . bisacodyl (DULCOLAX) suppository 10 mg  10 mg Rectal Daily PRN Joette Catching T, MD      . carvedilol (COREG) tablet 3.125 mg  3.125 mg Oral BID WC Belva Crome, MD   3.125 mg at 05/22/17 0826  . cholecalciferol (VITAMIN D) tablet 2,000 Units  2,000 Units Oral Daily Etta Quill, DO   2,000 Units at 05/22/17  [id number]  . doxycycline (VIBRA-TABS) tablet 100 mg  100 mg Oral BID Cherene Altes, MD   100 mg at 05/22/17 0827  . fentaNYL (SUBLIMAZE) injection 25 mcg  25 mcg Intravenous Once PRN Joette Catching T, MD      . heparin injection 5,000 Units  5,000 Units Subcutaneous Q8H Reino Bellis B, NP   5,000 Units at 05/22/17 1209  . HYDROcodone-acetaminophen (NORCO/VICODIN) 5-325 MG per tablet 1-2 tablet  1-2 tablet Oral Q6H PRN Vertis Kelch, NP   2 tablet at 05/18/17 2242  . insulin aspart (novoLOG) injection 0-5 Units  0-5 Units Subcutaneous QHS Joette Catching T, MD       . insulin aspart (novoLOG) injection 0-9 Units  0-9 Units Subcutaneous TID WC Cherene Altes, MD   1 Units at 05/22/17 1209  . menthol-cetylpyridinium (CEPACOL) lozenge 3 mg  1 lozenge Oral PRN Cherene Altes, MD      . nitroGLYCERIN (NITRODUR - Dosed in mg/24 hr) patch 0.2 mg  0.2 mg Transdermal Daily Belva Crome, MD   0.2 mg at 05/22/17 0825  . nitroGLYCERIN (NITROSTAT) SL tablet 0.4 mg  0.4 mg Sublingual Q5 Min x 3 PRN Etta Quill, DO      . omega-3 acid ethyl esters (LOVAZA) capsule 1,000 mg  1,000 mg Oral Daily Jennette Kettle M, DO   1,000 mg at 05/22/17 7517  . ondansetron (ZOFRAN) injection 4 mg  4 mg Intravenous Q6H PRN Etta Quill, DO      . pantoprazole (PROTONIX) EC tablet 40 mg  40 mg Oral QPC breakfast Etta Quill, DO   40 mg at 05/22/17 0017  . phenol (CHLORASEPTIC) mouth spray 1 spray  1 spray Mouth/Throat PRN Cherene Altes, MD   1 spray at 05/17/17 2136  . senna-docusate (Senokot-S) tablet 1 tablet  1 tablet Oral BID Cherene Altes, MD   1 tablet at 05/22/17 [phone]  . sorbitol 70 % solution 30-60 mL  30-60 mL Oral Daily PRN Cherene Altes, MD   30 mL at 05/15/17 1450     Discharge Medications: Please see discharge summary for a list of discharge medications.  Relevant Imaging Results:  Relevant Lab Results:   Additional Information SS# [id number]  Wende Neighbors, LCSW

## 2017-05-23 ENCOUNTER — Inpatient Hospital Stay (HOSPITAL_COMMUNITY): Payer: Medicare Other

## 2017-05-23 ENCOUNTER — Ambulatory Visit: Payer: Medicare Other | Admitting: Hematology

## 2017-05-23 LAB — GLUCOSE, CAPILLARY
GLUCOSE-CAPILLARY: 114 mg/dL — AB (ref 65–99)
GLUCOSE-CAPILLARY: 163 mg/dL — AB (ref 65–99)
Glucose-Capillary: 132 mg/dL — ABNORMAL HIGH (ref 65–99)
Glucose-Capillary: 119 mg/dL — ABNORMAL HIGH (ref 65–99)

## 2017-05-23 LAB — BASIC METABOLIC PANEL
Anion gap: 13 (ref 5–15)
BUN: 121 mg/dL — ABNORMAL HIGH (ref 6–20)
CALCIUM: 8.7 mg/dL — AB (ref 8.9–10.3)
CO2: 19 mmol/L — ABNORMAL LOW (ref 22–32)
CREATININE: 6.96 mg/dL — AB (ref 0.44–1.00)
Chloride: 103 mmol/L (ref 101–111)
GFR calc non Af Amer: 5 mL/min — ABNORMAL LOW (ref >60)
GFR, EST AFRICAN AMERICAN: 6 mL/min — AB (ref >60)
Glucose, Bld: 124 mg/dL — ABNORMAL HIGH (ref 65–99)
Potassium: 5.1 mmol/L (ref 3.5–5.1)
SODIUM: 135 mmol/L (ref 135–145)

## 2017-05-23 NOTE — Plan of Care (Signed)
progressing 

## 2017-05-23 NOTE — Care Management Note (Signed)
Case Management Note Marvetta Gibbons RN, BSN Unit 4E-Case Manager [phone]  Patient Details  Name: Chelsea [name] MRN: [id number] Date of Birth: [birth year]  Subjective/Objective:   Pt admitted with NSTEMI,  Vol. Overload,  Progressive kidney failure.                 Action/Plan: PTA pt lived at home, per PT eval recommendation for SNF- CSW following for placement needs when pt medically stable for transition to SNF.   Expected Discharge Date:                  Expected Discharge Plan:  Skilled Nursing Facility  In-House Referral:  Clinical Social Work  Discharge planning Services  CM Consult  Post Acute Care Choice:    Choice offered to:     DME Arranged:    DME Agency:     HH Arranged:    [place] Agency:     Status of Service:  In process, will continue to follow  If discussed at Long Length of Stay Meetings, dates discussed:    Discharge Disposition:   Additional Comments:  Dawayne Patricia, RN 05/23/2017, 10:36 AM

## 2017-05-23 NOTE — Progress Notes (Signed)
Patient ID: Chelsea [name], female   DOB: 11/09/1928, 84 y.o.   MRN: [id number] [facility] Progress Note   Assessment/ Plan:    1.  Acute kidney injury: This appears to be most likely ATN versus pigment induced nephropathy based on her presentation with relative hypotension as well as exposure to vancomycin/Zosyn-this was corroborated by her fraction excretion of sodium. She remains nonoliguric and her Cr has essentially plateaued but no real downtrend yet.  Keeping an eye on azotemia.  No indications for dialysis and hopefully we will not need to do so.  2.  Acute coronary syndrome/non-ST elevation MI: Ongoing medical management, no LHC due to renal function.  Intermittent lasix dosing.   3.  Osteomyelitis left foot:  Continued on her outpatient doses of doxycycline/Augmentin.  MRI of the left foot/ankle today, will see what that reveals  4.  Acute exacerbation of systolic/diastolic congestive heart failure: Intermittent Lasix dosing- expect 3rd spacing with sig hypoalbuminemia.  Subjective:    Continues to have slow improvement in Cr, azotemia a little worse.    Objective:   BP 135/71 (BP Location: Right Arm)   Pulse 75   Temp 97.9 F (36.6 C) (Oral)   Resp (!) 25   Ht 5\' 4"  (1.626 m)   Wt 82.5 kg (181 lb 14.1 oz)   SpO2 95%   BMI 31.22 kg/m   Intake/Output Summary (Last 24 hours) at 05/23/2017 1531 Last data filed at 05/23/2017 0325 Gross per 24 hour  Intake 120 ml  Output 2105 ml  Net -1985 ml   Weight change: -3.8 kg (-8 lb 6 oz)  Physical Exam: Gen: Comfortably resting in bed CVS: Pulse regular rhythm, normal rate, S1 and S2 normal, no rubs Resp: Decreased breath sounds over lower fields, no frank crackles otherwise clear to auscultation, no rhonchi Abd: Soft, flat, nontender Ext: Left ankle medial deeply violaceous ecchymosis noted, 2+ edema, right leg trace-1+ edema.  Neuro: no asterixis, AAO x 3  Imaging: No results found.  Labs: BMET Recent  Labs  Lab 05/18/17 0419 05/19/17 0323 05/19/17 1623 05/20/17 0446 05/21/17 0317 05/22/17 0427 05/23/17 0605  NA 132* 132* 135 136 136 137 135  K 5.6* 5.8* 5.6* 4.9 4.9 5.1 5.1  CL 103 102 104 104 103 105 103  CO2 18* 19* 18* 18* 19* 18* 19*  GLUCOSE 136* 124* 135* 139* 121* 123* 124*  BUN 74* 87* 94* 96* 105* 117* 121*  CREATININE 6.29* 6.66* 6.97* 6.90* 7.33* 7.11* 6.96*  CALCIUM 8.3* 8.5* 8.8* 8.6* 8.4* 8.6* 8.7*  PHOS 6.8* 7.5*  --   --   --   --   --    CBC Recent Labs  Lab 05/19/17 0323 05/20/17 0446 05/21/17 0317 05/22/17 0427  WBC 10.2 9.7 8.2 7.4  HGB 9.5* 9.9* 9.2* 9.7*  HCT 28.9* 29.8* 27.6* 29.5*  MCV 83.5 83.7 83.6 83.6  PLT 293 340 348 350    Medications:    . amoxicillin-clavulanate  500 mg Oral Daily  . aspirin  81 mg Oral QPC breakfast  . carvedilol  3.125 mg Oral BID WC  . cholecalciferol  2,000 Units Oral Daily  . doxycycline  100 mg Oral BID  . heparin injection (subcutaneous)  5,000 Units Subcutaneous Q8H  . insulin aspart  0-5 Units Subcutaneous QHS  . insulin aspart  0-9 Units Subcutaneous TID WC  . nitroGLYCERIN  0.2 mg Transdermal Daily  . omega-3 acid ethyl esters  1,000 mg Oral Daily  .  pantoprazole  40 mg Oral QPC breakfast  . senna-docusate  1 tablet Oral BID   Madelon Lips, MD [phone] 05/23/2017, 3:31 PM

## 2017-05-23 NOTE — Progress Notes (Signed)
PT Cancellation Note  Patient Details Name: Chelsea [name] MRN: [id number] DOB: [birth year]   Cancelled Treatment:    Reason Eval/Treat Not Completed: Other (comment).  Declined over anxiety from impending MRI and will try again later as time and pt allow.   Ramond Dial 05/23/2017, 10:00 AM   Mee Hives, PT MS Acute Rehab Dept. Number: [place] [facility] [place]

## 2017-05-23 NOTE — Progress Notes (Addendum)
Physical Therapy Treatment Patient Details Name: Chelsea [name] MRN: [id number] DOB: August 30, 1928 Today's Date: 05/23/2017    History of Present Illness 84y.o. F w/ a Hx of CVA, DM2 , HTN, Basal cell carcinoma of breast 1998, COPD, HLD, ITP, Hiatal hernia, and Chronic Osteomyelitis of L foot on chronic PO ABx who presented to the ED with c/o R shoulder pain.  She called EMS after being unable to get out of bed. Admitted 05/12/17 with NSTEMI    PT Comments    Pt was seen for evaluation of current function, still very appropriate for her to transition to SNF.  Her plan is to continue acute therapy to progress with strength and mobility as tolerated given her numbness on L foot and is still awaiting MRI for functional information about the foot and ankle.  Will anticipate a sliding board vs drop arm chair will be best strategy to get up, but if more capable might do a stand pivot on RW.   Follow Up Recommendations  SNF     Equipment Recommendations  None recommended by PT    Recommendations for Other Services       Precautions / Restrictions Precautions Precautions: Fall Precaution Comments: telemetry Restrictions Weight Bearing Restrictions: No    Mobility  Bed Mobility Overal bed mobility: Needs Assistance Bed Mobility: Supine to Sit;Sit to Supine     Supine to sit: Min assist;Mod assist Sit to supine: Mod assist   General bed mobility comments: mod to lift legs back to bed  Transfers                 General transfer comment: declined to attempt   Ambulation/Gait                 Stairs             Wheelchair Mobility    Modified Rankin (Stroke Patients Only)       Balance Overall balance assessment: Needs assistance Sitting-balance support: Feet supported Sitting balance-Leahy Scale: Fair Sitting balance - Comments: PT used contact on pt's shoulder to maintain balance when stepping away from pt toward the telemetry to start BP  reading Postural control: Right lateral lean                                  Cognition Arousal/Alertness: Awake/alert Behavior During Therapy: WFL for tasks assessed/performed Overall Cognitive Status: Within Functional Limits for tasks assessed                                        Exercises General Exercises - Lower Extremity Ankle Circles/Pumps: AROM;AAROM;Both;5 reps Long Arc Quad: AAROM;Strengthening;Both;10 reps Heel Slides: Strengthening;Right;10 reps    General Comments General comments (skin integrity, edema, etc.): Very noticeable LE edema and L ankle purple until foot placed on floor during sitting bedside      Pertinent Vitals/Pain Pain Assessment: 0-10 Pain Score: 7  Pain Location: Lt ankle/shin weith movement or light touch Pain Intervention(s): Limited activity within patient's tolerance;Monitored during session;Premedicated before session;Repositioned    Home Living                      Prior Function            PT Goals (current goals can now be found in the care plan section)  Acute Rehab PT Goals Patient Stated Goal: get stronger Progress towards PT goals: Progressing toward goals    Frequency    Min 2X/week      PT Plan Current plan remains appropriate    Co-evaluation              AM-PAC PT "6 Clicks" Daily Activity  Outcome Measure  Difficulty turning over in bed (including adjusting bedclothes, sheets and blankets)?: A Little Difficulty moving from lying on back to sitting on the side of the bed? : Unable Difficulty sitting down on and standing up from a chair with arms (e.g., wheelchair, bedside commode, etc,.)?: Unable Help needed moving to and from a bed to chair (including a wheelchair)?: Total Help needed walking in hospital room?: Total Help needed climbing 3-5 steps with a railing? : Total 6 Click Score: 8    End of Session Equipment Utilized During Treatment: Oxygen Activity  Tolerance: Patient limited by fatigue;Patient limited by pain Patient left: in bed;with bed alarm set;with call bell/phone within reach Nurse Communication: Mobility status PT Visit Diagnosis: Muscle weakness (generalized) (M62.81);Difficulty in walking, not elsewhere classified (R26.2);Pain Pain - Right/Left: Left Pain - part of body: Leg     Time: 1641-1700 PT Time Calculation (min) (ACUTE ONLY): 19 min  Charges:  $Therapeutic Activity: 8-22 mins                    G Codes:  Functional Assessment Tool Used: AM-PAC 6 Clicks Basic Mobility    Ramond Dial 05/23/2017, 5:46 PM   Mee Hives, PT MS Acute Rehab Dept. Number: [place] and [place]

## 2017-05-23 NOTE — Progress Notes (Signed)
PROGRESS NOTE    Chelsea [name]  NTI:[id number] DOB: 06/30/1928 DOA: 05/12/2017 PCP: Jani Gravel, MD    Brief Narrative:  [phone].o.F w/ a Hxof CVA,DM2, HTN,Basal cell carcinoma of breast1998, COPD, HLD, ITP,Hiatal hernia, and ChronicOsteomyelitis ofLfoot on chronic PO ABx who presented to the ED with c/o R shoulder pain. She called EMS after being unable to get out of bed.  Per EMS initial SBP was 70.  In the ED Trop was 3.5. EKG showed inferior ST depressions.      Assessment & Plan:   Principal Problem:   NSTEMI (non-ST elevated myocardial infarction) ([place]) Active Problems:   Diabetes mellitus ([place])   Chronic osteomyelitis of left foot ([facility])   HTN (hypertension)   Acute kidney failure ([facility])   Pressure injury of skin   Acute on chronic combined systolic and diastolic CHF (congestive heart failure) ([place])  #1 non-STEMI Presented with complaints of right shoulder pain noted to have initial systolic blood pressure in the 70s.  Troponins ordered were elevated.  EKG showed inferior ST wave depression.  2D echo was consistent with wall motion abnormalities in the territory of LAD disease.  Patient seen in consultation by cardiology and due to progressive kidney failure unable to undergo invasive procedures i.e. cardiac catheterization at this time and recommended medical management.  Patient currently chest pain-free.  Continue nitro patch, aspirin, Coreg, Lovaza.  Due to worsening renal function unable to place on the ACE inhibitor.  May need to transition to imdur in the next 1-2 days.  Will need to follow-up with cardiology in the outpatient setting.  2.  Acute systolic heart failure Secondary to acute MI in the LAD territory/coronary artery disease.  Patient noted to be in volume overload with severe renal disease.  Due to worsening renal function nephrology managing diuretics.  Patient with equal improvement.  Patient with a urine output of 2.65 L over the past 24 hours.   Patient is -5.5 L during this hospitalization.  Continue beta-blocker, aspirin, Nitropatch, lovaza. May need to transition to imdur in the next 24-48 hours.  Diuretics being dosed intermittently per nephrology.  3.  Acute renal failure Felt likely secondary to ATN versus pigment induced nephropathy as patient noted to have presented with relative hypotension, was on IV Vanco and Zosyn.  She with a urine output of 2.65 L over the past 24 hours.  Creatinine currently at 6.96 and seems to be plateauing.  Per nephrology.  4.  Chronic osteomyelitis left foot/lower extremity edema greater than right Patient with chronic ankle/foot osteomyelitis.  Patient currently afebrile.  Patient initially refused MRI however is in agreement for it and currently awaiting MRIs.  Lower extremity Dopplers negative for DVT.  Patient was on IV antibiotics and has been changed back to home oral dose antibiotic regimen.  Will need to follow-up with Dr. Megan Salon ID in the outpatient setting.  5.  Type 2 diabetes Hemoglobin A1c 6.8.  CBGs ranging from 114-163.  Continue current sliding scale insulin.     6 hypertension Stable.  Continue Coreg.Ntd patch.  7.  Altered mental status CT head negative for any acute abnormalities.  CT head with chronic moderate small vessel disease with atrophy concern for possible dementia at baseline.  Also concerned that uremia may be playing a role.  Monitor closely.  8 COPD Stable.     DVT prophylaxis: Heparin Code Status: Full Family Communication: updated patient, no family at bedside. Disposition Plan: Remain in stepdown unit.   Consultants:  Nephrology: Dr. Posey Pronto 05/16/2017  Cardiology: Dr.Nahser 05/13/2017  Procedures:   CT head CT C-spine 05/13/2017  Chest x-ray 05/16/2017, 05/12/2017  Renal ultrasound 05/14/2017  2D echo 05/24/2017--- EF 40-45%, hypokinesis of mid apical, anteroseptal, anterior, and apical myocardium consistent with ischemia of LAD-grade 1 DD  Lower  extremity Dopplers 05/20/2017  MRI left foot pending  Antimicrobials:   Augmentin 05/14/2017  Oral doxycycline 05/14/2017  IV Zosyn 05/13/2017>>>> 09/2017   Subjective: Patient states some improvement with shortness of breath.  Denies any chest pain.  Awaiting MRI.  Objective: Vitals:   05/22/17 2049 05/23/17 0000 05/23/17 0321 05/23/17 0859  BP: (!) 149/77 (!) 142/73 (!) 158/80 (!) 146/74  Pulse: 73 72 80 89  Resp: (!) 21 16  20   Temp: 99.3 F (37.4 C) 98 F (36.7 C) 97.9 F (36.6 C)   TempSrc: Oral Oral Oral   SpO2: 95% 94% 95%   Weight:   82.5 kg (181 lb 14.1 oz)   Height:        Intake/Output Summary (Last 24 hours) at 05/23/2017 1110 Last data filed at 05/23/2017 0325 Gross per 24 hour  Intake 356 ml  Output 2105 ml  Net -1749 ml   Filed Weights   05/21/17 0429 05/22/17 0541 05/23/17 0321  Weight: 87.7 kg (193 lb 5.5 oz) 86.3 kg (190 lb 4.1 oz) 82.5 kg (181 lb 14.1 oz)    Examination:  General exam: Appears calm and comfortable  Respiratory system: Clear to auscultation.  No wheezes, no crackles, no rhonchi noted.  Respiratory effort normal. Cardiovascular system: S1 & S2 heard, RRR. No JVD, murmurs, rubs, gallops or clicks. No pedal edema. Gastrointestinal system: Abdomen is nondistended, soft and nontender. No organomegaly or masses felt. Normal bowel sounds heard. Central nervous system: Alert and oriented. No focal neurological deficits. Extremities: Left lower extremity pedal 3+ edema.  Left lower extremity with deformed ankle limited ability to move left foot.  Right lower extremity with 1-2+ edema. Skin: No rashes, lesions or ulcers Psychiatry: Judgement and insight appear normal. Mood & affect appropriate.     Data Reviewed: I have personally reviewed following labs and imaging studies  CBC: Recent Labs  Lab 05/18/17 0419 05/19/17 0323 05/20/17 0446 05/21/17 0317 05/22/17 0427  WBC 9.3 10.2 9.7 8.2 7.4  HGB 9.7* 9.5* 9.9* 9.2* 9.7*  HCT 29.8*  28.9* 29.8* 27.6* 29.5*  MCV 84.7 83.5 83.7 83.6 83.6  PLT 293 293 340 348 009   Basic Metabolic Panel: Recent Labs  Lab 05/17/17 0313 05/18/17 0419 05/19/17 0323 05/19/17 1623 05/20/17 0446 05/21/17 0317 05/22/17 0427 05/23/17 0605  NA 133* 132* 132* 135 136 136 137 135  K 5.2* 5.6* 5.8* 5.6* 4.9 4.9 5.1 5.1  CL 104 103 102 104 104 103 105 103  CO2 18* 18* 19* 18* 18* 19* 18* 19*  GLUCOSE 148* 136* 124* 135* 139* 121* 123* 124*  BUN 57* 74* 87* 94* 96* 105* 117* 121*  CREATININE 5.78* 6.29* 6.66* 6.97* 6.90* 7.33* 7.11* 6.96*  CALCIUM 7.8* 8.3* 8.5* 8.8* 8.6* 8.4* 8.6* 8.7*  MG 1.8 1.7  --   --   --   --   --   --   PHOS  --  6.8* 7.5*  --   --   --   --   --    GFR: Estimated Creatinine Clearance: 6 mL/min (A) (by C-G formula based on SCr of 6.96 mg/dL (H)). Liver Function Tests: Recent Labs  Lab 05/17/17 0313 05/18/17 0419  05/19/17 0323 05/21/17 0317  AST 144*  --   --  25  ALT 127*  --   --  60*  ALKPHOS 73  --   --  56  BILITOT 0.6  --   --  0.6  PROT 5.0*  --   --  5.1*  ALBUMIN 1.9* 1.8* 1.9* 1.8*   No results for input(s): LIPASE, AMYLASE in the last 168 hours. No results for input(s): AMMONIA in the last 168 hours. Coagulation Profile: No results for input(s): INR, PROTIME in the last 168 hours. Cardiac Enzymes: Recent Labs  Lab 05/17/17 0313 05/21/17 0317  CKTOTAL 3,339* 114   BNP (last 3 results) No results for input(s): PROBNP in the last 8760 hours. HbA1C: No results for input(s): HGBA1C in the last 72 hours. CBG: Recent Labs  Lab 05/22/17 0613 05/22/17 1138 05/22/17 1615 05/22/17 2137 05/23/17 0615  GLUCAP 140* 148* 114* 133* 114*   Lipid Profile: No results for input(s): CHOL, HDL, LDLCALC, TRIG, CHOLHDL, LDLDIRECT in the last 72 hours. Thyroid Function Tests: No results for input(s): TSH, T4TOTAL, FREET4, T3FREE, THYROIDAB in the last 72 hours. Anemia Panel: No results for input(s): VITAMINB12, FOLATE, FERRITIN, TIBC, IRON,  RETICCTPCT in the last 72 hours. Sepsis Labs: No results for input(s): PROCALCITON, LATICACIDVEN in the last 168 hours.  Recent Results (from the past 240 hour(s))  MRSA PCR Screening     Status: None   Collection Time: 05/14/17  5:59 AM  Result Value Ref Range Status   MRSA by PCR NEGATIVE NEGATIVE Final    Comment:        The GeneXpert MRSA Assay (FDA approved for NASAL specimens only), is one component of a comprehensive MRSA colonization surveillance program. It is not intended to diagnose MRSA infection nor to guide or monitor treatment for MRSA infections. Performed at [facility], [address] [place], [place] [address]          Radiology Studies: No results found.      Scheduled Meds: . amoxicillin-clavulanate  500 mg Oral Daily  . aspirin  81 mg Oral QPC breakfast  . carvedilol  3.125 mg Oral BID WC  . cholecalciferol  2,000 Units Oral Daily  . doxycycline  100 mg Oral BID  . heparin injection (subcutaneous)  5,000 Units Subcutaneous Q8H  . insulin aspart  0-5 Units Subcutaneous QHS  . insulin aspart  0-9 Units Subcutaneous TID WC  . nitroGLYCERIN  0.2 mg Transdermal Daily  . omega-3 acid ethyl esters  1,000 mg Oral Daily  . pantoprazole  40 mg Oral QPC breakfast  . senna-docusate  1 tablet Oral BID   Continuous Infusions:   LOS: 10 days    Time spent: 35 minutes    Irine Seal, MD [facility] Pager [phone]  If 7PM-7AM, please contact night-coverage [email or web address] Password [email or web address] 05/23/2017, 11:10 AM

## 2017-05-23 NOTE — Progress Notes (Signed)
Patient with MRI order spoke with staff in MRI x2 today and stated that they did not have a time for her MRI. Dr. Grandville Silos made aware will monitor patient. Amariana Mirando, Bettina Gavia RN

## 2017-05-24 ENCOUNTER — Ambulatory Visit: Payer: Medicare Other | Admitting: Internal Medicine

## 2017-05-24 LAB — RENAL FUNCTION PANEL
ALBUMIN: 2.1 g/dL — AB (ref 3.5–5.0)
Anion gap: 13 (ref 5–15)
BUN: 122 mg/dL — ABNORMAL HIGH (ref 6–20)
CHLORIDE: 101 mmol/L (ref 101–111)
CO2: 20 mmol/L — ABNORMAL LOW (ref 22–32)
CREATININE: 6.5 mg/dL — AB (ref 0.44–1.00)
Calcium: 8.6 mg/dL — ABNORMAL LOW (ref 8.9–10.3)
GFR, EST AFRICAN AMERICAN: 6 mL/min — AB (ref >60)
GFR, EST NON AFRICAN AMERICAN: 5 mL/min — AB (ref >60)
Glucose, Bld: 118 mg/dL — ABNORMAL HIGH (ref 65–99)
PHOSPHORUS: 8 mg/dL — AB (ref 2.5–4.6)
Potassium: 5.1 mmol/L (ref 3.5–5.1)
Sodium: 134 mmol/L — ABNORMAL LOW (ref 135–145)

## 2017-05-24 LAB — CBC
HEMATOCRIT: 30.4 % — AB (ref 36.0–46.0)
Hemoglobin: 10.4 g/dL — ABNORMAL LOW (ref 12.0–15.0)
MCH: 28.6 pg (ref 26.0–34.0)
MCHC: 34.2 g/dL (ref 30.0–36.0)
MCV: 83.5 fL (ref 78.0–100.0)
PLATELETS: 352 10*3/uL (ref 150–400)
RBC: 3.64 MIL/uL — AB (ref 3.87–5.11)
RDW: 15.1 % (ref 11.5–15.5)
WBC: 7.7 10*3/uL (ref 4.0–10.5)

## 2017-05-24 LAB — GLUCOSE, CAPILLARY
GLUCOSE-CAPILLARY: 120 mg/dL — AB (ref 65–99)
GLUCOSE-CAPILLARY: 160 mg/dL — AB (ref 65–99)
GLUCOSE-CAPILLARY: 127 mg/dL — AB (ref 65–99)
Glucose-Capillary: 107 mg/dL — ABNORMAL HIGH (ref 65–99)

## 2017-05-24 MED ORDER — HYDROXYZINE HCL 10 MG PO TABS
10.0000 mg | ORAL_TABLET | Freq: Four times a day (QID) | ORAL | Status: DC | PRN
  Administered 2017-05-24: 10 mg via ORAL
  Filled 2017-05-24 (×2): qty 1

## 2017-05-24 MED ORDER — ISOSORBIDE MONONITRATE ER 30 MG PO TB24
15.0000 mg | ORAL_TABLET | Freq: Every day | ORAL | Status: DC
  Administered 2017-05-24 – 2017-05-29 (×6): 15 mg via ORAL
  Filled 2017-05-24 (×6): qty 1

## 2017-05-24 NOTE — Progress Notes (Signed)
Patient ID: Chelsea [name], female   DOB: 10-23-1928, 84 y.o.   MRN: [id number] [facility] Progress Note   Assessment/ Plan:    1.  Acute kidney injury: This appears to be most likely ATN versus pigment induced nephropathy based on her presentation with relative hypotension as well as exposure to vancomycin/Zosyn-this was corroborated by her fraction excretion of sodium. She remains nonoliguric and her Cr has essentially plateaued but no real downtrend yet.  Keeping an eye on azotemia.  No indications for dialysis and hopefully we will not need to do so.  2.  Acute coronary syndrome/non-ST elevation MI: Ongoing medical management, no LHC due to renal function.  Intermittent lasix dosing.   3.  Osteomyelitis left foot:  Continued on her outpatient doses of doxycycline/Augmentin.  MRI of the left foot/ankle showing myositis--> ?if needs ortho involvement  4.  Acute exacerbation of systolic/diastolic congestive heart failure: Intermittent Lasix dosing- expect 3rd spacing with sig hypoalbuminemia.  Subjective:    MRI leg showing likely myositis?  PT feels well today  Objective:   BP (!) 112/59 (BP Location: Right Arm)   Pulse 73   Temp 97.6 F (36.4 C) (Oral)   Resp (!) 29   Ht 5\' 4"  (1.626 m)   Wt 84.9 kg (187 lb 3.2 oz)   SpO2 93%   BMI 32.13 kg/m   Intake/Output Summary (Last 24 hours) at 05/24/2017 1224 Last data filed at 05/24/2017 0845 Gross per 24 hour  Intake 125 ml  Output 2950 ml  Net -2825 ml   Weight change: 2.413 kg (5 lb 5.1 oz)  Physical Exam: Gen: Comfortably resting in bed CVS: Pulse regular rhythm, normal rate, S1 and S2 normal, no rubs Resp: Decreased breath sounds over lower fields, no frank crackles otherwise clear to auscultation, no rhonchi Abd: Soft, flat, nontender Ext: Left ankle medial deeply violaceous ecchymosis noted, 2+ edema, right leg trace-1+ edema.  Neuro: no asterixis, AAO x 3  Imaging: Mr Tibia Fibula Left Wo  Contrast  Result Date: 05/23/2017 CLINICAL DATA:  84 year old female with diabetes and hypertension. History of basal cell carcinoma of the breast in 1998. Patient is on chronic oral antibiotics for left foot osteomyelitis. EXAM: MRI OF LOWER LEFT EXTREMITY WITHOUT CONTRAST TECHNIQUE: Multiplanar, multisequence MR imaging of the left leg was performed. No intravenous contrast was administered. COMPARISON:  None. FINDINGS: Bones/Joint/Cartilage Osteoarthritis of the patellofemoral and femorotibial compartments of the included knees. The left ankle is externally rotated on the frontal projection which may account for the subluxed appearance on the sagittal projection. No marrow signal abnormality, fracture nor frank bone destruction. No evidence of acute osteomyelitis of both legs. Ligaments Noncontributory Muscles and Tendons Intramuscular edema consistent with diffuse myositis of the gastrocnemius, soleus and anterior tibialis muscles on the left. Soft tissues Left-greater-than-right subcutaneous soft tissue edema either representing cellulitis or possibly third spacing of fluid. IMPRESSION: 1. Subcutaneous soft tissue edema of both legs left greater than right with intramuscular edema of the left gastrocnemius, soleus and tibialis anterior muscles consistent with a myositis. No evidence of pyomyositis. 2. No marrow signal abnormality to suggest acute osteo osteomyelitis nor fracture. No suspicious osseous lesions. 3. Tricompartmental osteoarthritis of the knees. Electronically Signed   By: Ashley Royalty M.D.   On: 05/23/2017 22:29    Labs: [employer] Recent Labs  Lab 05/18/17 0419 05/19/17 0323 05/19/17 1623 05/20/17 0446 05/21/17 0317 05/22/17 0427 05/23/17 0605 05/24/17 0312  NA 132* 132* 135 136 136 137 135 134*  K 5.6* 5.8* 5.6* 4.9 4.9 5.1 5.1 5.1  CL 103 102 104 104 103 105 103 101  CO2 18* 19* 18* 18* 19* 18* 19* 20*  GLUCOSE 136* 124* 135* 139* 121* 123* 124* 118*  BUN 74* 87* 94* 96* 105*  117* 121* 122*  CREATININE 6.29* 6.66* 6.97* 6.90* 7.33* 7.11* 6.96* 6.50*  CALCIUM 8.3* 8.5* 8.8* 8.6* 8.4* 8.6* 8.7* 8.6*  PHOS 6.8* 7.5*  --   --   --   --   --  8.0*   CBC Recent Labs  Lab 05/20/17 0446 05/21/17 0317 05/22/17 0427 05/24/17 0312  WBC 9.7 8.2 7.4 7.7  HGB 9.9* 9.2* 9.7* 10.4*  HCT 29.8* 27.6* 29.5* 30.4*  MCV 83.7 83.6 83.6 83.5  PLT 340 348 350 352    Medications:    . amoxicillin-clavulanate  500 mg Oral Daily  . aspirin  81 mg Oral QPC breakfast  . carvedilol  3.125 mg Oral BID WC  . cholecalciferol  2,000 Units Oral Daily  . doxycycline  100 mg Oral BID  . heparin injection (subcutaneous)  5,000 Units Subcutaneous Q8H  . insulin aspart  0-5 Units Subcutaneous QHS  . insulin aspart  0-9 Units Subcutaneous TID WC  . isosorbide mononitrate  15 mg Oral Daily  . omega-3 acid ethyl esters  1,000 mg Oral Daily  . pantoprazole  40 mg Oral QPC breakfast  . senna-docusate  1 tablet Oral BID   Madelon Lips, MD [phone] 05/24/2017, 12:24 PM

## 2017-05-24 NOTE — Progress Notes (Signed)
PROGRESS NOTE    Chelsea [name]  IRC:[id number] DOB: 1928-06-15 DOA: 05/12/2017 PCP: Jani Gravel, MD    Brief Narrative:  [phone].o.F w/ a Hxof CVA,DM2, HTN,Basal cell carcinoma of breast1998, COPD, HLD, ITP,Hiatal hernia, and ChronicOsteomyelitis ofLfoot on chronic PO ABx who presented to the ED with c/o R shoulder pain. She called EMS after being unable to get out of bed.  Per EMS initial SBP was 70.  In the ED Trop was 3.5. EKG showed inferior ST depressions.      Assessment & Plan:   Principal Problem:   NSTEMI (non-ST elevated myocardial infarction) ([facility]) Active Problems:   Diabetes mellitus ([place])   Chronic osteomyelitis of left foot ([facility])   HTN (hypertension)   Acute kidney failure ([facility])   Pressure injury of skin   Acute on chronic combined systolic and diastolic CHF (congestive heart failure) ([place])  #1 non-STEMI Presented with complaints of right shoulder pain noted to have initial systolic blood pressure in the 70s.  Troponins ordered were elevated.  EKG showed inferior ST wave depression.  2D echo was consistent with wall motion abnormalities in the territory of LAD disease.  Patient seen in consultation by cardiology and due to progressive kidney failure unable to undergo invasive procedures i.e. cardiac catheterization at this time and recommended medical management.  Patient currently chest pain-free.  Continue Coreg, aspirin, Lovaza.  Discontinue nitro patch.  Start imdur 15 mg daily.  Due to worsening renal function unable to place on the ACE inhibitor.  Patient follow-up with cardiology.   2.  Acute systolic heart failure Secondary to acute MI in the LAD territory/coronary artery disease.  Patient noted to be in volume overload with severe renal disease.  Due to worsening renal function, nephrology managing diuretics.  Patient improving clinically.  Patient with a urine output of 2.95 L over the past 24 hours.  Patient is - 8.45 L during this hospitalization.   Continue beta-blocker, aspirin, lovaza.  Continue nitro patch and start low-dose imdur 15 mg daily. Diuretics being dosed intermittently per nephrology.  3.  Acute renal failure Felt likely secondary to ATN versus pigment induced nephropathy as patient noted to have presented with relative hypotension, was on IV Vanco and Zosyn.  Creatinine was 0.80 on 4/7/ 2019.  Patient with a urine output of 2.950 L over the past 24 hours.  Creatinine is 6.50 from 6.96 from 7.11.  Renal function seems to be plateauing. Per nephrology.  4.  Chronic osteomyelitis left foot/lower extremity edema greater than right Patient with chronic ankle/foot osteomyelitis.  Patient currently afebrile.  Patient initially refused MRI however is in agreement for it and underwent MRI yesterday 05/23/2017.  MRI with myositis of left gastrocnemius, soleus, tibialis anterior muscles.  MRI negative for acute osteomyelitis.  No suspicious osseous lesions.  Tricompartmental osteoarthritis. Patient was on IV antibiotics and has been changed back to home oral dose antibiotic regimen.  Will need to follow-up with Dr. Megan Salon ID in the outpatient setting.  5.  Type 2 diabetes Hemoglobin A1c 6.8.  CBGs ranging from 119-132.  Continue SSI.     6 hypertension Continue current regimen of Coreg.  Discontinue nitroglycerin patch.  Start low-dose imdur.  7.  Altered mental status Improving.  CT head negative for any acute abnormalities.  CT head with chronic moderate small vessel disease with atrophy concern for possible dementia at baseline.  Also concerned that uremia may be playing a role.  Monitor closely.  8 COPD Stable.  Nebs as needed.  DVT prophylaxis: Heparin Code Status: Full Family Communication: updated patient, no family at bedside. Disposition Plan: Transfer to telemetry.  To skilled nursing facility once renal function has returned to baseline or improvement in the right direction, and per nephrology.   Consultants:    Nephrology: Dr. Posey Pronto 05/16/2017  Cardiology: Dr.Nahser 05/13/2017  Procedures:   CT head CT C-spine 05/13/2017  Chest x-ray 05/16/2017, 05/12/2017  Renal ultrasound 05/14/2017  2D echo 05/24/2017--- EF 40-45%, hypokinesis of mid apical, anteroseptal, anterior, and apical myocardium consistent with ischemia of LAD-grade 1 DD  Lower extremity Dopplers 05/20/2017  MRI left foot 05/23/2017  Antimicrobials:   Augmentin 05/14/2017  Oral doxycycline 05/14/2017  IV Zosyn 05/13/2017>>>> 09/2017   Subjective: Patient planes of back itching.  Denies any shortness of breath.  Denies any chest pain.   Objective: Vitals:   05/23/17 2151 05/24/17 0005 05/24/17 0319 05/24/17 0828  BP: (!) 123/92 (!) 163/80 (!) 152/75 118/87  Pulse: 76 66 80 89  Resp: 19 17 (!) 21 (!) 21  Temp: 99.3 F (37.4 C) 97.6 F (36.4 C) 98.9 F (37.2 C) 98.6 F (37 C)  TempSrc: Oral Tympanic Oral Axillary  SpO2: 94% 95% 97% 94%  Weight:   84.9 kg (187 lb 3.2 oz)   Height:        Intake/Output Summary (Last 24 hours) at 05/24/2017 1004 Last data filed at 05/24/2017 0249 Gross per 24 hour  Intake -  Output 2950 ml  Net -2950 ml   Filed Weights   05/22/17 0541 05/23/17 0321 05/24/17 0319  Weight: 86.3 kg (190 lb 4.1 oz) 82.5 kg (181 lb 14.1 oz) 84.9 kg (187 lb 3.2 oz)    Examination:  General exam: Appears calm and comfortable  Respiratory system: Clear to auscultation bilaterally.  No wheezes, no crackles, no rhonchi..  Cardiovascular system: Regular rate rhythm no murmurs rubs or gallops.  Trace to 1+ bilateral lower extremity edema.  Gastrointestinal system: Abdomen is soft, nontender, nondistended, positive bowel sounds.  No rebound.  No guarding.  Central nervous system: Alert and oriented. No focal neurological deficits. Extremities: Left lower extremity pedal 3+ edema.  Left lower extremity with deformed ankle limited ability to move left foot.  Some ecchymosis noted on the left ankle.  Right lower  extremity with trace to 1 + edema. Skin: No rashes, lesions or ulcers Psychiatry: Judgement and insight appear normal. Mood & affect appropriate.     Data Reviewed: I have personally reviewed following labs and imaging studies  CBC: Recent Labs  Lab 05/19/17 0323 05/20/17 0446 05/21/17 0317 05/22/17 0427 05/24/17 0312  WBC 10.2 9.7 8.2 7.4 7.7  HGB 9.5* 9.9* 9.2* 9.7* 10.4*  HCT 28.9* 29.8* 27.6* 29.5* 30.4*  MCV 83.5 83.7 83.6 83.6 83.5  PLT 293 340 348 350 222   Basic Metabolic Panel: Recent Labs  Lab 05/18/17 0419 05/19/17 0323  05/20/17 0446 05/21/17 0317 05/22/17 0427 05/23/17 0605 05/24/17 0312  NA 132* 132*   < > 136 136 137 135 134*  K 5.6* 5.8*   < > 4.9 4.9 5.1 5.1 5.1  CL 103 102   < > 104 103 105 103 101  CO2 18* 19*   < > 18* 19* 18* 19* 20*  GLUCOSE 136* 124*   < > 139* 121* 123* 124* 118*  BUN 74* 87*   < > 96* 105* 117* 121* 122*  CREATININE 6.29* 6.66*   < > 6.90* 7.33* 7.11* 6.96* 6.50*  CALCIUM 8.3* 8.5*   < >  8.6* 8.4* 8.6* 8.7* 8.6*  MG 1.7  --   --   --   --   --   --   --   PHOS 6.8* 7.5*  --   --   --   --   --  8.0*   < > = values in this interval not displayed.   GFR: Estimated Creatinine Clearance: 6.6 mL/min (A) (by C-G formula based on SCr of 6.5 mg/dL (H)). Liver Function Tests: Recent Labs  Lab 05/18/17 0419 05/19/17 0323 05/21/17 0317 05/24/17 0312  AST  --   --  25  --   ALT  --   --  60*  --   ALKPHOS  --   --  56  --   BILITOT  --   --  0.6  --   PROT  --   --  5.1*  --   ALBUMIN 1.8* 1.9* 1.8* 2.1*   No results for input(s): LIPASE, AMYLASE in the last 168 hours. No results for input(s): AMMONIA in the last 168 hours. Coagulation Profile: No results for input(s): INR, PROTIME in the last 168 hours. Cardiac Enzymes: Recent Labs  Lab 05/21/17 0317  CKTOTAL 114   BNP (last 3 results) No results for input(s): PROBNP in the last 8760 hours. HbA1C: No results for input(s): HGBA1C in the last 72 hours. CBG: Recent  Labs  Lab 05/23/17 0615 05/23/17 1125 05/23/17 1657 05/23/17 2154 05/24/17 0606  GLUCAP 114* 163* 132* 119* 120*   Lipid Profile: No results for input(s): CHOL, HDL, LDLCALC, TRIG, CHOLHDL, LDLDIRECT in the last 72 hours. Thyroid Function Tests: No results for input(s): TSH, T4TOTAL, FREET4, T3FREE, THYROIDAB in the last 72 hours. Anemia Panel: No results for input(s): VITAMINB12, FOLATE, FERRITIN, TIBC, IRON, RETICCTPCT in the last 72 hours. Sepsis Labs: No results for input(s): PROCALCITON, LATICACIDVEN in the last 168 hours.  No results found for this or any previous visit (from the past 240 hour(s)).       Radiology Studies: Mr Tibia Fibula Left Wo Contrast  Result Date: 05/23/2017 CLINICAL DATA:  84 year old female with diabetes and hypertension. History of basal cell carcinoma of the breast in 1998. Patient is on chronic oral antibiotics for left foot osteomyelitis. EXAM: MRI OF LOWER LEFT EXTREMITY WITHOUT CONTRAST TECHNIQUE: Multiplanar, multisequence MR imaging of the left leg was performed. No intravenous contrast was administered. COMPARISON:  None. FINDINGS: Bones/Joint/Cartilage Osteoarthritis of the patellofemoral and femorotibial compartments of the included knees. The left ankle is externally rotated on the frontal projection which may account for the subluxed appearance on the sagittal projection. No marrow signal abnormality, fracture nor frank bone destruction. No evidence of acute osteomyelitis of both legs. Ligaments Noncontributory Muscles and Tendons Intramuscular edema consistent with diffuse myositis of the gastrocnemius, soleus and anterior tibialis muscles on the left. Soft tissues Left-greater-than-right subcutaneous soft tissue edema either representing cellulitis or possibly third spacing of fluid. IMPRESSION: 1. Subcutaneous soft tissue edema of both legs left greater than right with intramuscular edema of the left gastrocnemius, soleus and tibialis anterior  muscles consistent with a myositis. No evidence of pyomyositis. 2. No marrow signal abnormality to suggest acute osteo osteomyelitis nor fracture. No suspicious osseous lesions. 3. Tricompartmental osteoarthritis of the knees. Electronically Signed   By: Ashley Royalty M.D.   On: 05/23/2017 22:29        Scheduled Meds: . amoxicillin-clavulanate  500 mg Oral Daily  . aspirin  81 mg Oral QPC breakfast  . carvedilol  3.125 mg Oral BID WC  . cholecalciferol  2,000 Units Oral Daily  . doxycycline  100 mg Oral BID  . heparin injection (subcutaneous)  5,000 Units Subcutaneous Q8H  . insulin aspart  0-5 Units Subcutaneous QHS  . insulin aspart  0-9 Units Subcutaneous TID WC  . isosorbide mononitrate  15 mg Oral Daily  . omega-3 acid ethyl esters  1,000 mg Oral Daily  . pantoprazole  40 mg Oral QPC breakfast  . senna-docusate  1 tablet Oral BID   Continuous Infusions:   LOS: 11 days    Time spent: 35 minutes    Irine Seal, MD [facility] Pager [phone]  If 7PM-7AM, please contact night-coverage [email or web address] Password [email or web address] 05/24/2017, 10:04 AM

## 2017-05-24 NOTE — Progress Notes (Signed)
Occupational Therapy Treatment Patient Details Name: Chelsea [name] MRN: [id number] DOB: 01-21-1929 Today's Date: 05/24/2017    History of present illness 84y.o. F w/ a Hx of CVA, DM2 , HTN, Basal cell carcinoma of breast 1998, COPD, HLD, ITP, Hiatal hernia, and Chronic Osteomyelitis of L foot on chronic PO ABx who presented to the ED with c/o R shoulder pain.  She called EMS after being unable to get out of bed. Admitted 05/12/17 with NSTEMI   OT comments  Pt progressing towards OT goals this session. Pt able to perform bed mobility at mod A and worked on core strength, seated balance during functional ADL (with set up and min A). Pt continues to require SNF level therapy with focus on activity tolerance, balance, and generalized deconditioning. Next session possibly consider theraband HEP for BUE to assist with transfers?  Follow Up Recommendations  SNF;Supervision/Assistance - 24 hour    Equipment Recommendations  3 in 1 bedside commode;Tub/shower bench    Recommendations for Other Services      Precautions / Restrictions Precautions Precautions: Fall Restrictions Weight Bearing Restrictions: No       Mobility Bed Mobility Overal bed mobility: Needs Assistance Bed Mobility: Supine to Sit;Sit to Supine     Supine to sit: Min assist;Mod assist Sit to supine: Mod assist   General bed mobility comments: mod to lift legs back to bed  Transfers                 General transfer comment: declined to attempt     Balance Overall balance assessment: Needs assistance Sitting-balance support: Feet supported Sitting balance-Leahy Scale: Fair Sitting balance - Comments: when distracted (performing activity in sitting) lean is more pronounced, and Pt fatigues quickly requiring support from OT in sitting Postural control: Right lateral lean                                 ADL either performed or assessed with clinical judgement   ADL Overall ADL's : Needs  assistance/impaired     Grooming: Oral care;Wash/dry face;Minimal assistance;Sitting;Brushing hair Grooming Details (indicate cue type and reason): fatigues quickly EOB - able to do more in supported sitting position                                General ADL Comments: Pt declined lateral transfer this session, she states that she is feeling better - but continues to demonstrate deconditioning and decreased activity tolerance     Vision   Vision Assessment?: No apparent visual deficits   Perception     Praxis      Cognition Arousal/Alertness: Awake/alert Behavior During Therapy: WFL for tasks assessed/performed Overall Cognitive Status: Within Functional Limits for tasks assessed                                          Exercises     Shoulder Instructions       General Comments      Pertinent Vitals/ Pain       Pain Assessment: Faces Faces Pain Scale: Hurts whole lot Pain Location: Lt ankle/shin weith movement or light touch Pain Descriptors / Indicators: Burning;Aching;Sharp Pain Intervention(s): Limited activity within patient's tolerance;Monitored during session;Repositioned  Home Living  Prior Functioning/Environment              Frequency  Min 2X/week        Progress Toward Goals  OT Goals(current goals can now be found in the care plan section)  Progress towards OT goals: Progressing toward goals  Acute Rehab OT Goals Patient Stated Goal: get stronger OT Goal Formulation: With patient Time For Goal Achievement: 06/04/17 Potential to Achieve Goals: Good  Plan Discharge plan remains appropriate;Frequency remains appropriate    Co-evaluation                 AM-PAC PT "6 Clicks" Daily Activity     Outcome Measure   Help from another person eating meals?: None Help from another person taking care of personal grooming?: A Little Help from another  person toileting, which includes using toliet, bedpan, or urinal?: Total Help from another person bathing (including washing, rinsing, drying)?: A Lot Help from another person to put on and taking off regular upper body clothing?: A Lot Help from another person to put on and taking off regular lower body clothing?: A Lot 6 Click Score: 14    End of Session    OT Visit Diagnosis: Unsteadiness on feet (R26.81);Muscle weakness (generalized) (M62.81);Pain Pain - Right/Left: Left Pain - part of body: Leg   Activity Tolerance Patient limited by fatigue;Patient limited by pain   Patient Left in bed;with call bell/phone within reach;with bed alarm set   Nurse Communication Mobility status        Time: 4540-9811 OT Time Calculation (min): 26 min  Charges: OT General Charges $OT Visit: 1 Visit OT Treatments $Self Care/Home Management : 23-37 mins  Hulda Humphrey OTR/L [place] 05/24/2017, 1:25 PM

## 2017-05-24 NOTE — Progress Notes (Signed)
Physical Therapy Treatment Patient Details Name: Chelsea [name] MRN: [id number] DOB: April 28, 1928 Today's Date: 05/24/2017    History of Present Illness 84y.o. F w/ a Hx of CVA, DM2 , HTN, Basal cell carcinoma of breast 1998, COPD, HLD, ITP, Hiatal hernia, and Chronic Osteomyelitis of L foot on chronic PO ABx who presented to the ED with c/o R shoulder pain.  She called EMS after being unable to get out of bed. Admitted 05/12/17 with NSTEMI    PT Comments    Session involved assisting RN with transfer from recliner to bed. Patient requiring two person mod assist for squat pivot (2 person hand held assist). Continues to be very limited by pain, with screaming/moaning with any type of movement or touch of the LLE. Repositioned in bed. May could benefit from transfer training using slideboard or Stedy to decrease weightbearing through LLE for pain tolerance.     Follow Up Recommendations  SNF     Equipment Recommendations  None recommended by PT    Recommendations for Other Services       Precautions / Restrictions Precautions Precautions: Fall Restrictions Weight Bearing Restrictions: No    Mobility  Bed Mobility Overal bed mobility: Needs Assistance Bed Mobility: Sit to Supine     Supine to sit: Min assist;Mod assist Sit to supine: Mod assist   General bed mobility comments: mod to lift legs back to bed  Transfers Overall transfer level: Needs assistance Equipment used: 2 person hand held assist Transfers: Squat Pivot Transfers     Squat pivot transfers: +2 physical assistance;Mod assist     General transfer comment: Patient requiring modA + 2 for squat pivot from recliner to bed. Instructed on hand positioning.   Ambulation/Gait                 Stairs             Wheelchair Mobility    Modified Rankin (Stroke Patients Only)       Balance Overall balance assessment: Needs assistance Sitting-balance support: Feet supported Sitting balance-Leahy  Scale: Fair Sitting balance - Comments: when distracted (performing activity in sitting) lean is more pronounced, and Pt fatigues quickly requiring support from OT in sitting Postural control: Right lateral lean                                  Cognition Arousal/Alertness: Awake/alert Behavior During Therapy: WFL for tasks assessed/performed Overall Cognitive Status: Within Functional Limits for tasks assessed                                        Exercises      General Comments        Pertinent Vitals/Pain Pain Assessment: Faces Faces Pain Scale: Hurts whole lot Pain Location: Lt ankle/shin with movement or light touch Pain Descriptors / Indicators: Burning;Aching;Sharp Pain Intervention(s): Limited activity within patient's tolerance;Monitored during session;Repositioned    Home Living                      Prior Function            PT Goals (current goals can now be found in the care plan section) Acute Rehab PT Goals Patient Stated Goal: get stronger PT Goal Formulation: With patient Time For Goal Achievement: 06/04/17 Potential to Achieve Goals:  Good Progress towards PT goals: Progressing toward goals    Frequency    Min 2X/week      PT Plan Current plan remains appropriate    Co-evaluation              AM-PAC PT "6 Clicks" Daily Activity  Outcome Measure  Difficulty turning over in bed (including adjusting bedclothes, sheets and blankets)?: A Lot Difficulty moving from lying on back to sitting on the side of the bed? : Unable Difficulty sitting down on and standing up from a chair with arms (e.g., wheelchair, bedside commode, etc,.)?: Unable Help needed moving to and from a bed to chair (including a wheelchair)?: A Lot Help needed walking in hospital room?: Total Help needed climbing 3-5 steps with a railing? : Total 6 Click Score: 8    End of Session Equipment Utilized During Treatment: Gait  belt Activity Tolerance: Patient limited by fatigue;Patient limited by pain Patient left: in bed;with bed alarm set;with call bell/phone within reach Nurse Communication: Mobility status PT Visit Diagnosis: Muscle weakness (generalized) (M62.81);Difficulty in walking, not elsewhere classified (R26.2);Pain Pain - Right/Left: Left Pain - part of body: Leg     Time: 1440-1450 PT Time Calculation (min) (ACUTE ONLY): 10 min  Charges:  $Therapeutic Activity: 8-22 mins                    G Codes:       Ellamae Sia, PT, DPT Acute Rehabilitation Services  Pager: [place] 05/24/2017, 3:00 PM

## 2017-05-25 LAB — CBC
HEMATOCRIT: 28.5 % — AB (ref 36.0–46.0)
HEMOGLOBIN: 9.5 g/dL — AB (ref 12.0–15.0)
MCH: 27.9 pg (ref 26.0–34.0)
MCHC: 33.3 g/dL (ref 30.0–36.0)
MCV: 83.6 fL (ref 78.0–100.0)
Platelets: 298 10*3/uL (ref 150–400)
RBC: 3.41 MIL/uL — AB (ref 3.87–5.11)
RDW: 15.6 % — ABNORMAL HIGH (ref 11.5–15.5)
WBC: 6.7 10*3/uL (ref 4.0–10.5)

## 2017-05-25 LAB — GLUCOSE, CAPILLARY
GLUCOSE-CAPILLARY: 128 mg/dL — AB (ref 65–99)
Glucose-Capillary: 94 mg/dL (ref 65–99)
Glucose-Capillary: 234 mg/dL — ABNORMAL HIGH (ref 65–99)
Glucose-Capillary: 106 mg/dL — ABNORMAL HIGH (ref 65–99)

## 2017-05-25 LAB — RENAL FUNCTION PANEL
Albumin: 2.1 g/dL — ABNORMAL LOW (ref 3.5–5.0)
Anion gap: 15 (ref 5–15)
BUN: 119 mg/dL — AB (ref 6–20)
CALCIUM: 8.5 mg/dL — AB (ref 8.9–10.3)
CO2: 17 mmol/L — AB (ref 22–32)
CREATININE: 5.84 mg/dL — AB (ref 0.44–1.00)
Chloride: 103 mmol/L (ref 101–111)
GFR calc non Af Amer: 6 mL/min — ABNORMAL LOW (ref >60)
GFR, EST AFRICAN AMERICAN: 7 mL/min — AB (ref >60)
GLUCOSE: 104 mg/dL — AB (ref 65–99)
Phosphorus: 8.1 mg/dL — ABNORMAL HIGH (ref 2.5–4.6)
Potassium: 4.8 mmol/L (ref 3.5–5.1)
SODIUM: 135 mmol/L (ref 135–145)

## 2017-05-25 NOTE — Progress Notes (Signed)
Patient ID: Chelsea [name], female   DOB: 1928/06/13, 84 y.o.   MRN: [id number] [facility] Progress Note   Assessment/ Plan:    1.  Acute kidney injury: This appears to be most likely ATN versus pigment induced nephropathy based on her presentation with relative hypotension as well as exposure to vancomycin/Zosyn-this was corroborated by her fraction excretion of sodium. She remains nonoliguric and Cr has finally downtrended--> continue to monitor.  No indications for dialysis and hopefully will not need.  2.  Acute coronary syndrome/non-ST elevation MI: Ongoing medical management, no LHC due to renal function.  Intermittent lasix dosing but hasn't needed any in several days.   3.  Osteomyelitis left foot:  Continued on her outpatient doses of doxycycline/Augmentin.  MRI of the left foot/ankle showing myositis--> per primary  4.  Acute exacerbation of systolic/diastolic congestive heart failure: Intermittent Lasix dosing- expect 3rd spacing with sig hypoalbuminemia.  Subjective:    Cr down to 5.8, UOP looking good- turning a corner.    Objective:   BP 118/68 (BP Location: Right Arm)   Pulse 72   Temp 97.6 F (36.4 C) (Oral)   Resp 20   Ht 5\' 4"  (1.626 m)   Wt 82 kg (180 lb 12.4 oz)   SpO2 94%   BMI 31.03 kg/m   Intake/Output Summary (Last 24 hours) at 05/25/2017 1550 Last data filed at 05/25/2017 0544 Gross per 24 hour  Intake -  Output 1800 ml  Net -1800 ml   Weight change: -2.913 kg (-6 lb 6.8 oz)  Physical Exam: Gen: Comfortably resting in bed CVS: Pulse regular rhythm, normal rate, S1 and S2 normal, no rubs Resp: Decreased breath sounds over lower fields, no frank crackles otherwise clear to auscultation, no rhonchi Abd: Soft, flat, nontender Ext: Left ankle medial deeply violaceous ecchymosis noted, 2+ edema, right leg trace-1+ edema.  Neuro: no asterixis, AAO x 3  Imaging: Mr Tibia Fibula Left Wo Contrast  Result Date: 05/23/2017 CLINICAL DATA:   84 year old female with diabetes and hypertension. History of basal cell carcinoma of the breast in 1998. Patient is on chronic oral antibiotics for left foot osteomyelitis. EXAM: MRI OF LOWER LEFT EXTREMITY WITHOUT CONTRAST TECHNIQUE: Multiplanar, multisequence MR imaging of the left leg was performed. No intravenous contrast was administered. COMPARISON:  None. FINDINGS: Bones/Joint/Cartilage Osteoarthritis of the patellofemoral and femorotibial compartments of the included knees. The left ankle is externally rotated on the frontal projection which may account for the subluxed appearance on the sagittal projection. No marrow signal abnormality, fracture nor frank bone destruction. No evidence of acute osteomyelitis of both legs. Ligaments Noncontributory Muscles and Tendons Intramuscular edema consistent with diffuse myositis of the gastrocnemius, soleus and anterior tibialis muscles on the left. Soft tissues Left-greater-than-right subcutaneous soft tissue edema either representing cellulitis or possibly third spacing of fluid. IMPRESSION: 1. Subcutaneous soft tissue edema of both legs left greater than right with intramuscular edema of the left gastrocnemius, soleus and tibialis anterior muscles consistent with a myositis. No evidence of pyomyositis. 2. No marrow signal abnormality to suggest acute osteo osteomyelitis nor fracture. No suspicious osseous lesions. 3. Tricompartmental osteoarthritis of the knees. Electronically Signed   By: Ashley Royalty M.D.   On: 05/23/2017 22:29    Labs: BMET Recent Labs  Lab 05/19/17 0323 05/19/17 1623 05/20/17 0446 05/21/17 0317 05/22/17 0427 05/23/17 0605 05/24/17 0312 05/25/17 0222  NA 132* 135 136 136 137 135 134* 135  K 5.8* 5.6* 4.9 4.9 5.1 5.1 5.1 4.8  CL 102 104 104 103 105 103 101 103  CO2 19* 18* 18* 19* 18* 19* 20* 17*  GLUCOSE 124* 135* 139* 121* 123* 124* 118* 104*  BUN 87* 94* 96* 105* 117* 121* 122* 119*  CREATININE 6.66* 6.97* 6.90* 7.33*  7.11* 6.96* 6.50* 5.84*  CALCIUM 8.5* 8.8* 8.6* 8.4* 8.6* 8.7* 8.6* 8.5*  PHOS 7.5*  --   --   --   --   --  8.0* 8.1*   CBC Recent Labs  Lab 05/21/17 0317 05/22/17 0427 05/24/17 0312 05/25/17 0222  WBC 8.2 7.4 7.7 6.7  HGB 9.2* 9.7* 10.4* 9.5*  HCT 27.6* 29.5* 30.4* 28.5*  MCV 83.6 83.6 83.5 83.6  PLT 348 350 352 298    Medications:    . amoxicillin-clavulanate  500 mg Oral Daily  . aspirin  81 mg Oral QPC breakfast  . carvedilol  3.125 mg Oral BID WC  . cholecalciferol  2,000 Units Oral Daily  . doxycycline  100 mg Oral BID  . heparin injection (subcutaneous)  5,000 Units Subcutaneous Q8H  . insulin aspart  0-5 Units Subcutaneous QHS  . insulin aspart  0-9 Units Subcutaneous TID WC  . isosorbide mononitrate  15 mg Oral Daily  . omega-3 acid ethyl esters  1,000 mg Oral Daily  . pantoprazole  40 mg Oral QPC breakfast  . senna-docusate  1 tablet Oral BID   Madelon Lips, MD [phone] 05/25/2017, 3:50 PM

## 2017-05-25 NOTE — Progress Notes (Signed)
PROGRESS NOTE    Chelsea [name]  VQQ:[id number] DOB: [birth year] DOA: 05/12/2017 PCP: Jani Gravel, MD    Brief Narrative:  [id number].o.F w/ a Hxof CVA,DM2, HTN,Basal cell carcinoma of breast1998, COPD, HLD, ITP,Hiatal hernia, and ChronicOsteomyelitis ofLfoot on chronic PO ABx who presented to the ED with c/o R shoulder pain. She called EMS after being unable to get out of bed.  Per EMS initial SBP was 70.  In the ED Trop was 3.5. EKG showed inferior ST depressions.      Assessment & Plan:   Principal Problem:   NSTEMI (non-ST elevated myocardial infarction) ([place]) Active Problems:   Diabetes mellitus ([place])   Chronic osteomyelitis of left foot ([facility])   HTN (hypertension)   Acute kidney failure ([facility])   Pressure injury of skin   Acute on chronic combined systolic and diastolic CHF (congestive heart failure) ([place])  #1 non-STEMI Presented with complaints of right shoulder pain noted to have initial systolic blood pressure in the 70s.  Troponins ordered were elevated.  EKG showed inferior ST wave depression.  2D echo was consistent with wall motion abnormalities in the territory of LAD disease.  Patient seen in consultation by cardiology and due to progressive kidney failure unable to undergo invasive procedures i.e. cardiac catheterization at this time and recommended medical management.  Patient currently chest pain-free.  Continue Coreg, aspirin, Lovaza, imdur.  Nitro patch has been discontinued.  Due to worsening renal function unable to place on the ACE inhibitor.  Patient follow-up with cardiology in the outpatient setting.   2.  Acute systolic heart failure Secondary to acute MI in the LAD territory/coronary artery disease.  Patient noted to be in volume overload with severe renal disease.  Due to worsening renal function, nephrology managing diuretics.  Patient improving clinically.  Patient with a urine output of 1.80 L over the past 24 hours.  Patient is -10.00 L during this  hospitalization.  Continue beta-blocker, aspirin, lovaza.  Nitro patch has been discontinued and patient started on low-dose imdur 15 mg daily. Diuretics being dosed intermittently per nephrology.  3.  Acute renal failure Felt likely secondary to ATN versus pigment induced nephropathy as patient noted to have presented with relative hypotension, was on IV Vanco and Zosyn.  Creatinine was 0.80 on 4/7/ 2019.  Patient with a urine output of 1.8 L over the past 24 hours.  Creatinine is 5.84 from 6.50 from 6.96 from 7.11.  Renal function seems to have plateaued and now trending back down.  Per nephrology.  4.  Chronic osteomyelitis left foot/lower extremity edema greater than right Patient with chronic ankle/foot osteomyelitis.  Patient currently afebrile.  Patient initially refused MRI however is in agreement for it and underwent MRI yesterday 05/23/2017.  MRI with myositis of left gastrocnemius, soleus, tibialis anterior muscles.  MRI negative for acute osteomyelitis.  No suspicious osseous lesions.  Tricompartmental osteoarthritis. Patient was on IV antibiotics and has been changed back to home oral dose antibiotic regimen.  Will need to follow-up with Dr. Megan Salon ID in the outpatient setting.  5.  Type 2 diabetes Hemoglobin A1c 6.8.  CBGs ranging from 106-234.  Continue SSI.     6 hypertension Blood pressure stable.  Continue current regimen of Coreg and imdur.   7.  Altered mental status Improved.  CT head negative for any acute abnormalities.  CT head with chronic moderate small vessel disease with atrophy concern for possible dementia at baseline.  Also concerned that uremia may be playing a role.  Monitor closely.  8 COPD Stable.  Nebs as needed.     DVT prophylaxis: Heparin Code Status: Full Family Communication: updated patient, no family at bedside. Disposition Plan: Transfer to telemetry.  To skilled nursing facility once renal function has returned to baseline or improvement in the  right direction, and per nephrology.   Consultants:   Nephrology: Dr. Posey Pronto 05/16/2017  Cardiology: Dr.Nahser 05/13/2017  Procedures:   CT head CT C-spine 05/13/2017  Chest x-ray 05/16/2017, 05/12/2017  Renal ultrasound 05/14/2017  2D echo 05/24/2017--- EF 40-45%, hypokinesis of mid apical, anteroseptal, anterior, and apical myocardium consistent with ischemia of LAD-grade 1 DD  Lower extremity Dopplers 05/20/2017  MRI left foot 05/23/2017  Antimicrobials:   Augmentin 05/14/2017  Oral doxycycline 05/14/2017  IV Zosyn 05/13/2017>>>> 05/14/2017   Subjective: Patient in bed.  States shortness of breath improved.  Denies any chest pain.  She is having good urine output.   Objective: Vitals:   05/25/17 0800 05/25/17 0900 05/25/17 1000 05/25/17 1100  BP: (!) 149/68     Pulse: 75 71 79 88  Resp: 18 (!) 21 (!) 23 (!) 23  Temp:      TempSrc:      SpO2: 95% 94% 97% 94%  Weight:      Height:        Intake/Output Summary (Last 24 hours) at 05/25/2017 1206 Last data filed at 05/25/2017 0544 Gross per 24 hour  Intake -  Output 1800 ml  Net -1800 ml   Filed Weights   05/23/17 0321 05/24/17 0319 05/25/17 0400  Weight: 82.5 kg (181 lb 14.1 oz) 84.9 kg (187 lb 3.2 oz) 82 kg (180 lb 12.4 oz)    Examination:  General exam: Appears calm and comfortable  Respiratory system: Lungs clear to auscultation bilaterally.  No wheezes, no crackles, no rhonchi.   Cardiovascular system: RRR no murmurs rubs or gallops. Trace bilateral lower extremity edema.  Gastrointestinal system: Abdomen is nontender, nondistended, soft, positive bowel sounds.  No guarding.  No rebound.   Central nervous system: Alert and oriented. No focal neurological deficits. Extremities: Left lower extremity pedal 2-3+ edema.  Left lower extremity with deformed ankle limited ability to move left foot.  Some ecchymosis noted on the left ankle.  Right lower extremity with trace + edema. Skin: No rashes, lesions or  ulcers Psychiatry: Judgement and insight appear normal. Mood & affect appropriate.     Data Reviewed: I have personally reviewed following labs and imaging studies  CBC: Recent Labs  Lab 05/20/17 0446 05/21/17 0317 05/22/17 0427 05/24/17 0312 05/25/17 0222  WBC 9.7 8.2 7.4 7.7 6.7  HGB 9.9* 9.2* 9.7* 10.4* 9.5*  HCT 29.8* 27.6* 29.5* 30.4* 28.5*  MCV 83.7 83.6 83.6 83.5 83.6  PLT 340 348 350 352 889   Basic Metabolic Panel: Recent Labs  Lab 05/19/17 0323  05/21/17 0317 05/22/17 0427 05/23/17 0605 05/24/17 0312 05/25/17 0222  NA 132*   < > 136 137 135 134* 135  K 5.8*   < > 4.9 5.1 5.1 5.1 4.8  CL 102   < > 103 105 103 101 103  CO2 19*   < > 19* 18* 19* 20* 17*  GLUCOSE 124*   < > 121* 123* 124* 118* 104*  BUN 87*   < > 105* 117* 121* 122* 119*  CREATININE 6.66*   < > 7.33* 7.11* 6.96* 6.50* 5.84*  CALCIUM 8.5*   < > 8.4* 8.6* 8.7* 8.6* 8.5*  PHOS 7.5*  --   --   --   --  8.0* 8.1*   < > = values in this interval not displayed.   GFR: Estimated Creatinine Clearance: 7.2 mL/min (A) (by C-G formula based on SCr of 5.84 mg/dL (H)). Liver Function Tests: Recent Labs  Lab 05/19/17 0323 05/21/17 0317 05/24/17 0312 05/25/17 0222  AST  --  25  --   --   ALT  --  60*  --   --   ALKPHOS  --  56  --   --   BILITOT  --  0.6  --   --   PROT  --  5.1*  --   --   ALBUMIN 1.9* 1.8* 2.1* 2.1*   No results for input(s): LIPASE, AMYLASE in the last 168 hours. No results for input(s): AMMONIA in the last 168 hours. Coagulation Profile: No results for input(s): INR, PROTIME in the last 168 hours. Cardiac Enzymes: Recent Labs  Lab 05/21/17 0317  CKTOTAL 114   BNP (last 3 results) No results for input(s): PROBNP in the last 8760 hours. HbA1C: No results for input(s): HGBA1C in the last 72 hours. CBG: Recent Labs  Lab 05/24/17 1113 05/24/17 1643 05/24/17 2101 05/25/17 0607 05/25/17 1116  GLUCAP 160* 107* 127* 94 234*   Lipid Profile: No results for input(s):  CHOL, HDL, LDLCALC, TRIG, CHOLHDL, LDLDIRECT in the last 72 hours. Thyroid Function Tests: No results for input(s): TSH, T4TOTAL, FREET4, T3FREE, THYROIDAB in the last 72 hours. Anemia Panel: No results for input(s): VITAMINB12, FOLATE, FERRITIN, TIBC, IRON, RETICCTPCT in the last 72 hours. Sepsis Labs: No results for input(s): PROCALCITON, LATICACIDVEN in the last 168 hours.  No results found for this or any previous visit (from the past 240 hour(s)).       Radiology Studies: Mr Tibia Fibula Left Wo Contrast  Result Date: 05/23/2017 CLINICAL DATA:  84 year old female with diabetes and hypertension. History of basal cell carcinoma of the breast in 1998. Patient is on chronic oral antibiotics for left foot osteomyelitis. EXAM: MRI OF LOWER LEFT EXTREMITY WITHOUT CONTRAST TECHNIQUE: Multiplanar, multisequence MR imaging of the left leg was performed. No intravenous contrast was administered. COMPARISON:  None. FINDINGS: Bones/Joint/Cartilage Osteoarthritis of the patellofemoral and femorotibial compartments of the included knees. The left ankle is externally rotated on the frontal projection which may account for the subluxed appearance on the sagittal projection. No marrow signal abnormality, fracture nor frank bone destruction. No evidence of acute osteomyelitis of both legs. Ligaments Noncontributory Muscles and Tendons Intramuscular edema consistent with diffuse myositis of the gastrocnemius, soleus and anterior tibialis muscles on the left. Soft tissues Left-greater-than-right subcutaneous soft tissue edema either representing cellulitis or possibly third spacing of fluid. IMPRESSION: 1. Subcutaneous soft tissue edema of both legs left greater than right with intramuscular edema of the left gastrocnemius, soleus and tibialis anterior muscles consistent with a myositis. No evidence of pyomyositis. 2. No marrow signal abnormality to suggest acute osteo osteomyelitis nor fracture. No suspicious  osseous lesions. 3. Tricompartmental osteoarthritis of the knees. Electronically Signed   By: Ashley Royalty M.D.   On: 05/23/2017 22:29        Scheduled Meds: . amoxicillin-clavulanate  500 mg Oral Daily  . aspirin  81 mg Oral QPC breakfast  . carvedilol  3.125 mg Oral BID WC  . cholecalciferol  2,000 Units Oral Daily  . doxycycline  100 mg Oral BID  . heparin injection (subcutaneous)  5,000 Units Subcutaneous Q8H  . insulin aspart  0-5 Units Subcutaneous QHS  . insulin aspart  0-9 Units  Subcutaneous TID WC  . isosorbide mononitrate  15 mg Oral Daily  . omega-3 acid ethyl esters  1,000 mg Oral Daily  . pantoprazole  40 mg Oral QPC breakfast  . senna-docusate  1 tablet Oral BID   Continuous Infusions:   LOS: 12 days    Time spent: 35 minutes    Irine Seal, MD [facility] Pager [phone]  If 7PM-7AM, please contact night-coverage [email or web address] Password [facility] 05/25/2017, 12:06 PM

## 2017-05-26 LAB — CBC
HEMATOCRIT: 28.8 % — AB (ref 36.0–46.0)
Hemoglobin: 9.9 g/dL — ABNORMAL LOW (ref 12.0–15.0)
MCH: 28.7 pg (ref 26.0–34.0)
MCHC: 34.4 g/dL (ref 30.0–36.0)
MCV: 83.5 fL (ref 78.0–100.0)
Platelets: 301 10*3/uL (ref 150–400)
RBC: 3.45 MIL/uL — ABNORMAL LOW (ref 3.87–5.11)
RDW: 15.7 % — AB (ref 11.5–15.5)
WBC: 8 10*3/uL (ref 4.0–10.5)

## 2017-05-26 LAB — RENAL FUNCTION PANEL
ALBUMIN: 2.2 g/dL — AB (ref 3.5–5.0)
Anion gap: 16 — ABNORMAL HIGH (ref 5–15)
BUN: 120 mg/dL — AB (ref 6–20)
CO2: 19 mmol/L — ABNORMAL LOW (ref 22–32)
Calcium: 8.6 mg/dL — ABNORMAL LOW (ref 8.9–10.3)
Chloride: 101 mmol/L (ref 101–111)
Creatinine, Ser: 5.34 mg/dL — ABNORMAL HIGH (ref 0.44–1.00)
GFR calc Af Amer: 8 mL/min — ABNORMAL LOW (ref >60)
GFR calc non Af Amer: 7 mL/min — ABNORMAL LOW (ref >60)
Glucose, Bld: 106 mg/dL — ABNORMAL HIGH (ref 65–99)
POTASSIUM: 4.5 mmol/L (ref 3.5–5.1)
Phosphorus: 7.6 mg/dL — ABNORMAL HIGH (ref 2.5–4.6)
Sodium: 136 mmol/L (ref 135–145)

## 2017-05-26 LAB — GLUCOSE, CAPILLARY
GLUCOSE-CAPILLARY: 158 mg/dL — AB (ref 65–99)
Glucose-Capillary: 102 mg/dL — ABNORMAL HIGH (ref 65–99)
Glucose-Capillary: 159 mg/dL — ABNORMAL HIGH (ref 65–99)
Glucose-Capillary: 134 mg/dL — ABNORMAL HIGH (ref 65–99)

## 2017-05-26 NOTE — Progress Notes (Signed)
Patient ID: Chelsea [name], female   DOB: [birth year], 84 y.o.   MRN: [id number] [facility] Progress Note   Assessment/ Plan:    1.  Acute kidney injury: This appears to be most likely ATN versus pigment induced nephropathy based on her presentation with relative hypotension as well as exposure to vancomycin/Zosyn. She remains nonoliguric and Cr has finally downtrended--> really turning a corner today, looks like post-ATN diuresis.  If Cr continues to downtrend will likely be able to discharge in the next day or two.    2.  Acute coronary syndrome/non-ST elevation MI: Ongoing medical management, no LHC due to renal function.  Intermittent lasix dosing but hasn't needed any in several days.   3.  Osteomyelitis left foot:  Continued on her outpatient doses of doxycycline/Augmentin.  MRI of the left foot/ankle showing myositis--> per primary  4.  Acute exacerbation of systolic/diastolic congestive heart failure: Intermittent Lasix dosing- expect 3rd spacing with sig hypoalbuminemia.  5.  Dispo: if Cr still downtrending tomorrow could d/c potentially tomorrow or Monday  Subjective:    Cr down to 5.3.  Looking good.  UOP brisk.  Objective:   BP 130/62 (BP Location: Right Leg)   Pulse 83   Temp 98.9 F (37.2 C) (Axillary)   Resp (!) 22   Ht 5\' 4"  (1.626 m)   Wt 82.2 kg (181 lb 3.5 oz)   SpO2 95%   BMI 31.11 kg/m   Intake/Output Summary (Last 24 hours) at 05/26/2017 1244 Last data filed at 05/26/2017 1100 Gross per 24 hour  Intake 100 ml  Output 3750 ml  Net -3650 ml   Weight change: 0.2 kg (7.1 oz)  Physical Exam: Gen: Comfortably resting in bed CVS: Pulse regular rhythm, normal rate, S1 and S2 normal, no rubs Resp: Decreased breath sounds over lower fields, no frank crackles otherwise clear to auscultation, no rhonchi Abd: Soft, flat, nontender Ext: Left ankle medial deeply violaceous ecchymosis noted, 2+ edema (largely unchanged), right leg trace-1+ edema.  Neuro:  no asterixis, AAO x 3  Imaging: No results found.  Labs: BMET Recent Labs  Lab 05/20/17 0446 05/21/17 0317 05/22/17 0427 05/23/17 0605 05/24/17 0312 05/25/17 0222 05/26/17 0312  NA 136 136 137 135 134* 135 136  K 4.9 4.9 5.1 5.1 5.1 4.8 4.5  CL 104 103 105 103 101 103 101  CO2 18* 19* 18* 19* 20* 17* 19*  GLUCOSE 139* 121* 123* 124* 118* 104* 106*  BUN 96* 105* 117* 121* 122* 119* 120*  CREATININE 6.90* 7.33* 7.11* 6.96* 6.50* 5.84* 5.34*  CALCIUM 8.6* 8.4* 8.6* 8.7* 8.6* 8.5* 8.6*  PHOS  --   --   --   --  8.0* 8.1* 7.6*   CBC Recent Labs  Lab 05/22/17 0427 05/24/17 0312 05/25/17 0222 05/26/17 0312  WBC 7.4 7.7 6.7 8.0  HGB 9.7* 10.4* 9.5* 9.9*  HCT 29.5* 30.4* 28.5* 28.8*  MCV 83.6 83.5 83.6 83.5  PLT 350 352 298 301    Medications:    . amoxicillin-clavulanate  500 mg Oral Daily  . aspirin  81 mg Oral QPC breakfast  . carvedilol  3.125 mg Oral BID WC  . cholecalciferol  2,000 Units Oral Daily  . doxycycline  100 mg Oral BID  . heparin injection (subcutaneous)  5,000 Units Subcutaneous Q8H  . insulin aspart  0-5 Units Subcutaneous QHS  . insulin aspart  0-9 Units Subcutaneous TID WC  . isosorbide mononitrate  15 mg Oral Daily  .  omega-3 acid ethyl esters  1,000 mg Oral Daily  . pantoprazole  40 mg Oral QPC breakfast  . senna-docusate  1 tablet Oral BID   Madelon Lips, MD [phone] 05/26/2017, 12:44 PM

## 2017-05-26 NOTE — Progress Notes (Signed)
PROGRESS NOTE    Chelsea [name]  KZS:[id number] DOB: [birth year] DOA: 05/12/2017 PCP: Jani Gravel, MD    Brief Narrative:  [phone].o.F w/ a Hxof CVA,DM2, HTN,Basal cell carcinoma of breast1998, COPD, HLD, ITP,Hiatal hernia, and ChronicOsteomyelitis ofLfoot on chronic PO ABx who presented to the ED with c/o R shoulder pain. She called EMS after being unable to get out of bed.  Per EMS initial SBP was 70.  In the ED Trop was 3.5. EKG showed inferior ST depressions.      Assessment & Plan:   Principal Problem:   NSTEMI (non-ST elevated myocardial infarction) ([place]) Active Problems:   Diabetes mellitus ([place])   Chronic osteomyelitis of left foot (HCC)   HTN (hypertension)   Acute kidney failure (HCC)   Pressure injury of skin   Acute on chronic combined systolic and diastolic CHF (congestive heart failure) ([place])  #1 non-STEMI Presented with complaints of right shoulder pain noted to have initial systolic blood pressure in the 70s.  Troponins ordered were elevated.  EKG showed inferior ST wave depression.  2D echo was consistent with wall motion abnormalities in the territory of LAD disease.  Patient seen in consultation by cardiology and due to progressive kidney failure unable to undergo invasive procedures i.e. cardiac catheterization at this time and recommended medical management.  Patient currently chest pain-free.  Continue current regimen of Coreg, aspirin, Lovaza, imdur.  Nitro patch has been discontinued.  Due to worsening renal function unable to place on the ACE inhibitor.  Patient follow-up with cardiology in the outpatient setting.   2.  Acute systolic heart failure Secondary to acute MI in the LAD territory/coronary artery disease.  Patient noted to be in volume overload with severe renal disease.  Due to worsening renal function, nephrology managing diuretics.  Patient improving clinically.  Patient with a urine output of 2.950 L over the past 24 hours.  Patient is -  13.654 L during this hospitalization.  Continue beta-blocker, aspirin, lovaza, imdur.  Diuretics being dosed intermittently per nephrology.  3.  Acute renal failure Felt likely secondary to ATN versus pigment induced nephropathy as patient noted to have presented with relative hypotension, was on IV Vanco and Zosyn.  Creatinine was 0.80 on 4/7/ 2019.  Patient with a urine output of 2.950 L over the past 24 hours.  Patient is -13.654 L during this hospitalization.  Creatinine is 5.34 from 5.84 from 6.50 from 6.96 from 7.11.  Renal function seems to have plateaued and now trending back down.  Per nephrology.  4.  Chronic osteomyelitis left foot/lower extremity edema greater than right Patient with chronic ankle/foot osteomyelitis.  Patient currently afebrile.  Patient initially refused MRI however is in agreement for it and underwent MRI 05/23/2017.  MRI with myositis of left gastrocnemius, soleus, tibialis anterior muscles.  MRI negative for acute osteomyelitis.  No suspicious osseous lesions.  Tricompartmental osteoarthritis. Patient was on IV antibiotics and has been changed back to home oral dose antibiotic regimen.  Outpatient follow-up with Dr. Megan Salon, ID.  5.  Type 2 diabetes Hemoglobin A1c 6.8.  CBGs ranging from 102-159.  Continue SSI.     6 hypertension Controlled.  Continue current regimen of Coreg and imdur.  7.  Altered mental status Improved.  Likely close to baseline.  CT head negative for any acute abnormalities.  CT head with chronic moderate small vessel disease with atrophy concern for possible dementia at baseline.  Also concerned that uremia may be playing a role.  Monitor closely.  8 COPD Stable.  Nebs as needed.     DVT prophylaxis: Heparin Code Status: Full Family Communication: updated patient, no family at bedside. Disposition Plan: To skilled nursing facility once renal function has returned to baseline or improvement in the right direction, and per  nephrology.   Consultants:   Nephrology: Dr. Posey Pronto 05/16/2017  Cardiology: Dr.Nahser 05/13/2017  Procedures:   CT head CT C-spine 05/13/2017  Chest x-ray 05/16/2017, 05/12/2017  Renal ultrasound 05/14/2017  2D echo 05/24/2017--- EF 40-45%, hypokinesis of mid apical, anteroseptal, anterior, and apical myocardium consistent with ischemia of LAD-grade 1 DD  Lower extremity Dopplers 05/20/2017  MRI left foot 05/23/2017  Antimicrobials:   Augmentin 05/14/2017  Oral doxycycline 05/14/2017  IV Zosyn 05/13/2017>>>> 05/14/2017   Subjective: Patient denies any shortness of breath.  No chest pain.  Good urine output.  Happy to hear her renal function is improving.  States had a difficult time sitting the chair.  Objective: Vitals:   05/25/17 1939 05/26/17 0000 05/26/17 0447 05/26/17 0900  BP: 133/67 (!) 122/56 133/66 130/62  Pulse: 75 71 84 83  Resp: (!) 23 18 (!) 22   Temp: 99.5 F (37.5 C)  99.5 F (37.5 C) 98.9 F (37.2 C)  TempSrc: Oral  Oral Axillary  SpO2: 99% 94% 95% 95%  Weight:   82.2 kg (181 lb 3.5 oz)   Height:        Intake/Output Summary (Last 24 hours) at 05/26/2017 1145 Last data filed at 05/26/2017 0900 Gross per 24 hour  Intake 100 ml  Output 2950 ml  Net -2850 ml   Filed Weights   05/24/17 0319 05/25/17 0400 05/26/17 0447  Weight: 84.9 kg (187 lb 3.2 oz) 82 kg (180 lb 12.4 oz) 82.2 kg (181 lb 3.5 oz)    Examination:  General exam: NAD Respiratory system: CTAB.  No crackles, no rhonchi, no wheezing.  Cardiovascular system: Regular rate rhythm no murmurs rubs or gallops.  Trace bilateral lower extremity edema.  No JVD.  Gastrointestinal system: Abdomen is soft, nontender, nondistended, positive bowel sounds.  No rebound.  No guarding.  Central nervous system: Alert and oriented. No focal neurological deficits. Extremities: Left lower extremity pedal 2+ edema.  Left lower extremity with deformed ankle limited ability to move left foot.  Some ecchymosis noted on the  left ankle.  Right lower extremity with trace + edema. Skin: No rashes, lesions or ulcers Psychiatry: Judgement and insight appear normal. Mood & affect appropriate.     Data Reviewed: I have personally reviewed following labs and imaging studies  CBC: Recent Labs  Lab 05/21/17 0317 05/22/17 0427 05/24/17 0312 05/25/17 0222 05/26/17 0312  WBC 8.2 7.4 7.7 6.7 8.0  HGB 9.2* 9.7* 10.4* 9.5* 9.9*  HCT 27.6* 29.5* 30.4* 28.5* 28.8*  MCV 83.6 83.6 83.5 83.6 83.5  PLT 348 350 352 298 308   Basic Metabolic Panel: Recent Labs  Lab 05/22/17 0427 05/23/17 0605 05/24/17 0312 05/25/17 0222 05/26/17 0312  NA 137 135 134* 135 136  K 5.1 5.1 5.1 4.8 4.5  CL 105 103 101 103 101  CO2 18* 19* 20* 17* 19*  GLUCOSE 123* 124* 118* 104* 106*  BUN 117* 121* 122* 119* 120*  CREATININE 7.11* 6.96* 6.50* 5.84* 5.34*  CALCIUM 8.6* 8.7* 8.6* 8.5* 8.6*  PHOS  --   --  8.0* 8.1* 7.6*   GFR: Estimated Creatinine Clearance: 7.8 mL/min (A) (by C-G formula based on SCr of 5.34 mg/dL (H)). Liver Function Tests: Recent Labs  Lab 05/21/17 0317 05/24/17 0312 05/25/17 0222 05/26/17 0312  AST 25  --   --   --   ALT 60*  --   --   --   ALKPHOS 56  --   --   --   BILITOT 0.6  --   --   --   PROT 5.1*  --   --   --   ALBUMIN 1.8* 2.1* 2.1* 2.2*   No results for input(s): LIPASE, AMYLASE in the last 168 hours. No results for input(s): AMMONIA in the last 168 hours. Coagulation Profile: No results for input(s): INR, PROTIME in the last 168 hours. Cardiac Enzymes: Recent Labs  Lab 05/21/17 0317  CKTOTAL 114   BNP (last 3 results) No results for input(s): PROBNP in the last 8760 hours. HbA1C: No results for input(s): HGBA1C in the last 72 hours. CBG: Recent Labs  Lab 05/25/17 1116 05/25/17 1647 05/25/17 2055 05/26/17 0604 05/26/17 1107  GLUCAP 234* 106* 128* 102* 159*   Lipid Profile: No results for input(s): CHOL, HDL, LDLCALC, TRIG, CHOLHDL, LDLDIRECT in the last 72 hours. Thyroid  Function Tests: No results for input(s): TSH, T4TOTAL, FREET4, T3FREE, THYROIDAB in the last 72 hours. Anemia Panel: No results for input(s): VITAMINB12, FOLATE, FERRITIN, TIBC, IRON, RETICCTPCT in the last 72 hours. Sepsis Labs: No results for input(s): PROCALCITON, LATICACIDVEN in the last 168 hours.  No results found for this or any previous visit (from the past 240 hour(s)).       Radiology Studies: No results found.      Scheduled Meds: . amoxicillin-clavulanate  500 mg Oral Daily  . aspirin  81 mg Oral QPC breakfast  . carvedilol  3.125 mg Oral BID WC  . cholecalciferol  2,000 Units Oral Daily  . doxycycline  100 mg Oral BID  . heparin injection (subcutaneous)  5,000 Units Subcutaneous Q8H  . insulin aspart  0-5 Units Subcutaneous QHS  . insulin aspart  0-9 Units Subcutaneous TID WC  . isosorbide mononitrate  15 mg Oral Daily  . omega-3 acid ethyl esters  1,000 mg Oral Daily  . pantoprazole  40 mg Oral QPC breakfast  . senna-docusate  1 tablet Oral BID   Continuous Infusions:   LOS: 13 days    Time spent: 35 minutes    Irine Seal, MD [facility] Pager [phone]  If 7PM-7AM, please contact night-coverage [email or web address] Password [email or web address] 05/26/2017, 11:45 AM

## 2017-05-26 NOTE — Progress Notes (Signed)
Clinical Social Worker following patient for support and discharge need. CSW will continue to follow patient until medically cleared by MD.  Rhea Pink, MSW,  [place]

## 2017-05-26 NOTE — Plan of Care (Signed)
Care plan reviewed, and patient is progressing.

## 2017-05-27 ENCOUNTER — Inpatient Hospital Stay (HOSPITAL_COMMUNITY): Payer: Medicare Other

## 2017-05-27 LAB — RENAL FUNCTION PANEL
Albumin: 2.2 g/dL — ABNORMAL LOW (ref 3.5–5.0)
Anion gap: 14 (ref 5–15)
BUN: 117 mg/dL — AB (ref 6–20)
CALCIUM: 8.6 mg/dL — AB (ref 8.9–10.3)
CHLORIDE: 103 mmol/L (ref 101–111)
CO2: 19 mmol/L — AB (ref 22–32)
CREATININE: 4.86 mg/dL — AB (ref 0.44–1.00)
GFR calc Af Amer: 8 mL/min — ABNORMAL LOW (ref >60)
GFR calc non Af Amer: 7 mL/min — ABNORMAL LOW (ref >60)
GLUCOSE: 118 mg/dL — AB (ref 65–99)
Phosphorus: 6.8 mg/dL — ABNORMAL HIGH (ref 2.5–4.6)
Potassium: 4.5 mmol/L (ref 3.5–5.1)
SODIUM: 136 mmol/L (ref 135–145)

## 2017-05-27 LAB — GLUCOSE, CAPILLARY
GLUCOSE-CAPILLARY: 152 mg/dL — AB (ref 65–99)
GLUCOSE-CAPILLARY: 123 mg/dL — AB (ref 65–99)
Glucose-Capillary: 124 mg/dL — ABNORMAL HIGH (ref 65–99)
Glucose-Capillary: 148 mg/dL — ABNORMAL HIGH (ref 65–99)

## 2017-05-27 NOTE — Progress Notes (Signed)
Patient ID: Chelsea [name], female   DOB: 1928/06/23, 84 y.o.   MRN: [id number] [facility] Progress Note   Assessment/ Plan:    1.  Acute kidney injury: This appears to be most likely ATN versus pigment induced nephropathy based on her presentation with relative hypotension as well as exposure to vancomycin/Zosyn. She remains nonoliguric and Cr has finally downtrended--> really turning a corner today, looks like post-ATN diuresis.  If Cr continues to downtrend will likely be able to discharge in the next day or two.  She'll need followup with our office arranged at time of discharge.  I'm going to remove the Foley today.  2.  Acute coronary syndrome/non-ST elevation MI: Ongoing medical management, no LHC due to renal function.  Intermittent lasix dosing but hasn't needed any in several days.   3.  Osteomyelitis left foot:  Continued on her outpatient doses of doxycycline/Augmentin.  MRI of the left foot/ankle showing myositis--> per primary  4.  Acute exacerbation of systolic/diastolic congestive heart failure: Intermittent Lasix dosing- expect 3rd spacing with sig hypoalbuminemia.  5.  Dispo: Could d/c with OP followup from our perspective.  Subjective:    Cr coming down even more- 4.9, still azotemic but not uremic.  Pt would like to get up into chair  Objective:   BP (!) 111/56   Pulse 81   Temp 99 F (37.2 C) (Oral)   Resp 12   Ht 5\' 4"  (1.626 m)   Wt 80.2 kg (176 lb 12.9 oz)   SpO2 97%   BMI 30.35 kg/m   Intake/Output Summary (Last 24 hours) at 05/27/2017 1202 Last data filed at 05/27/2017 1040 Gross per 24 hour  Intake 600 ml  Output 1150 ml  Net -550 ml   Weight change: -2 kg (-4 lb 6.6 oz)  Physical Exam: Gen: Comfortably resting in bed CVS: Pulse regular rhythm, normal rate, S1 and S2 normal, no rubs Resp: Decreased breath sounds over lower fields, no frank crackles otherwise clear to auscultation, no rhonchi Abd: Soft, flat, nontender Ext: Left ankle  medial deeply violaceous ecchymosis noted, 2+ edema (largely unchanged), right leg trace-1+ edema.  Neuro: no asterixis, AAO x 3  Imaging: No results found.  Labs: BMET Recent Labs  Lab 05/21/17 0317 05/22/17 0427 05/23/17 0605 05/24/17 0312 05/25/17 0222 05/26/17 0312 05/27/17 0247  NA 136 137 135 134* 135 136 136  K 4.9 5.1 5.1 5.1 4.8 4.5 4.5  CL 103 105 103 101 103 101 103  CO2 19* 18* 19* 20* 17* 19* 19*  GLUCOSE 121* 123* 124* 118* 104* 106* 118*  BUN 105* 117* 121* 122* 119* 120* 117*  CREATININE 7.33* 7.11* 6.96* 6.50* 5.84* 5.34* 4.86*  CALCIUM 8.4* 8.6* 8.7* 8.6* 8.5* 8.6* 8.6*  PHOS  --   --   --  8.0* 8.1* 7.6* 6.8*   CBC Recent Labs  Lab 05/22/17 0427 05/24/17 0312 05/25/17 0222 05/26/17 0312  WBC 7.4 7.7 6.7 8.0  HGB 9.7* 10.4* 9.5* 9.9*  HCT 29.5* 30.4* 28.5* 28.8*  MCV 83.6 83.5 83.6 83.5  PLT 350 352 298 301    Medications:    . amoxicillin-clavulanate  500 mg Oral Daily  . aspirin  81 mg Oral QPC breakfast  . carvedilol  3.125 mg Oral BID WC  . cholecalciferol  2,000 Units Oral Daily  . doxycycline  100 mg Oral BID  . heparin injection (subcutaneous)  5,000 Units Subcutaneous Q8H  . insulin aspart  0-5 Units Subcutaneous QHS  .  insulin aspart  0-9 Units Subcutaneous TID WC  . isosorbide mononitrate  15 mg Oral Daily  . omega-3 acid ethyl esters  1,000 mg Oral Daily  . pantoprazole  40 mg Oral QPC breakfast  . senna-docusate  1 tablet Oral BID   Madelon Lips, MD [phone] 05/27/2017, 12:02 PM

## 2017-05-27 NOTE — Progress Notes (Signed)
Chelsea [name] has complained of pain in her right shoulder extending down to her forearm. She says that it is an aching and thinks it could be due to a draft of air in her room. I have examined her arm and do not see anything remarkable. Administered Norco (2 tablets) at 9pm and at 6:30 am this morning for pain described as 10/10. Will continue to monitor. Lajoyce Corners, RN

## 2017-05-27 NOTE — Plan of Care (Signed)
Care plan reviewed, and patient is progressing.

## 2017-05-27 NOTE — Progress Notes (Signed)
PROGRESS NOTE    Chelsea [name]  YQM:[id number] DOB: 1928/09/30 DOA: 05/12/2017 PCP: Jani Gravel, MD    Brief Narrative:  [phone].o.F w/ a Hxof CVA,DM2, HTN,Basal cell carcinoma of breast1998, COPD, HLD, ITP,Hiatal hernia, and ChronicOsteomyelitis ofLfoot on chronic PO ABx who presented to the ED with c/o R shoulder pain. She called EMS after being unable to get out of bed.  Per EMS initial SBP was 70.  In the ED Trop was 3.5. EKG showed inferior ST depressions.      Assessment & Plan:   Principal Problem:   NSTEMI (non-ST elevated myocardial infarction) ([place]) Active Problems:   Diabetes mellitus ([place])   Chronic osteomyelitis of left foot (H[facility])   HTN (hypertension)   Acute kidney failure ([facility])   Pressure injury of skin   Acute on chronic combined systolic and diastolic CHF (congestive heart failure) ([place])  #1 non-STEMI Presented with complaints of right shoulder pain noted to have initial systolic blood pressure in the 70s.  Troponins ordered were elevated.  EKG showed inferior ST wave depression.  2D echo was consistent with wall motion abnormalities in the territory of LAD disease.  Patient seen in consultation by cardiology and due to progressive kidney failure unable to undergo invasive procedures i.e. cardiac catheterization at this time and recommended medical management.  Patient currently chest pain-free.  Continue current regimen of Coreg, aspirin, Lovaza, imdur.  Nitro patch has been discontinued.  Due to worsening renal function unable to place on the ACE inhibitor.  Patient follow-up with cardiology in the outpatient setting.   2.  Acute systolic heart failure Secondary to acute MI in the LAD territory/coronary artery disease.  Patient noted to be in volume overload with severe renal disease.  Due to worsening renal function, nephrology managing diuretics.  Patient improving clinically.  Patient with a urine output of 1.2 L over the past 24 hours.  Patient is -  13.455 L during this hospitalization.  Continue beta-blocker, aspirin, lovaza, imdur.  Diuretics being dosed intermittently per nephrology.  3.  Acute renal failure Felt likely secondary to ATN versus pigment induced nephropathy as patient noted to have presented with relative hypotension, was on IV Vanco and Zosyn.  Creatinine was 0.80 on 4/7/ 2019.  Patient with a urine output of 1.2 L over the past 24 hours.  Patient is -13.455 L during this hospitalization.  Creatinine trending down and improving daily and currently at 4.86 5.34 from 5.84 from 6.50 from 6.96 from 7.11.  Per nephrology.  4.  Chronic osteomyelitis left foot/lower extremity edema greater than right Patient with chronic ankle/foot osteomyelitis.  Patient currently afebrile.  Patient initially refused MRI however is in agreement for it and underwent MRI 05/23/2017.  MRI with myositis of left gastrocnemius, soleus, tibialis anterior muscles.  MRI negative for acute osteomyelitis.  No suspicious osseous lesions.  Tricompartmental osteoarthritis. Patient was on IV antibiotics and has been changed back to home oral dose antibiotic regimen.  Outpatient follow-up with Dr. Megan Salon, ID.  5.  Type 2 diabetes Hemoglobin A1c 6.8.  CBGs ranging from 124-158.  Continue SSI.     6 hypertension Stable.  Continue Coreg and imdur.  7.  Altered mental status Improved.  Patient likely close to baseline at this time.  CT head negative for any acute abnormalities.  CT head with chronic moderate small vessel disease with atrophy concern for possible dementia at baseline.  Also concerned that uremia may be playing a role.  Monitor closely.  8 COPD Stable.  Continue current nebulizers as needed.  9.  Right shoulder/right upper extremity pain Questionable etiology.  Will check plain films of the right shoulder and right upper extremity.  Continue warm compresses and pain management.     DVT prophylaxis: Heparin Code Status: Full Family  Communication: updated patient, no family at bedside. Disposition Plan: To skilled nursing facility if renal function continues to improve and resolution of right upper extremity pain hopefully in the next 24-48 hours and when okay with nephrology.    Consultants:   Nephrology: Dr. Posey Pronto 05/16/2017  Cardiology: Dr.Nahser 05/13/2017  Procedures:   CT head CT C-spine 05/13/2017  Chest x-ray 05/16/2017, 05/12/2017  Renal ultrasound 05/14/2017  2D echo 05/24/2017--- EF 40-45%, hypokinesis of mid apical, anteroseptal, anterior, and apical myocardium consistent with ischemia of LAD-grade 1 DD  Lower extremity Dopplers 05/20/2017  MRI left foot 05/23/2017  Antimicrobials:   Augmentin 05/14/2017  Oral doxycycline 05/14/2017  IV Zosyn 05/13/2017>>>> 05/14/2017   Subjective: Patient complaining of right shoulder and right upper extremity pain that she states has been ongoing for the past 2 days.  Denies shortness of breath.  Denies chest pain.  Good urine output.   Objective: Vitals:   05/26/17 0900 05/26/17 2009 05/27/17 0514 05/27/17 0800  BP: 130/62 140/72 112/64 (!) 111/56  Pulse: 83 74 81   Resp:  18 17 12   Temp: 98.9 F (37.2 C) 98.9 F (37.2 C) 99.7 F (37.6 C) 99 F (37.2 C)  TempSrc: Axillary Oral Oral Oral  SpO2: 95% 94% 95% 97%  Weight:   80.2 kg (176 lb 12.9 oz)   Height:        Intake/Output Summary (Last 24 hours) at 05/27/2017 0942 Last data filed at 05/26/2017 1634 Gross per 24 hour  Intake 240 ml  Output 1200 ml  Net -960 ml   Filed Weights   05/25/17 0400 05/26/17 0447 05/27/17 0514  Weight: 82 kg (180 lb 12.4 oz) 82.2 kg (181 lb 3.5 oz) 80.2 kg (176 lb 12.9 oz)    Examination:  General exam: NAD Respiratory system: Lungs clear to auscultation bilaterally.  No wheezes, no crackles, no rhonchi.  Cardiovascular system: RRR no murmurs rubs or gallops.  Trace bilateral lower extremity edema.  No JVD.   Gastrointestinal system: Abdomen is tender, nondistended, soft,  positive bowel sounds.  No rebound.  No guarding.  Central nervous system: Alert and oriented. No focal neurological deficits. Extremities: Left lower extremity pedal 2+ edema.  Left lower extremity with deformed ankle limited ability to move left foot.  Some ecchymosis noted on the left ankle.  Right lower extremity with trace + edema. Skin: No rashes, lesions or ulcers Psychiatry: Judgement and insight appear normal. Mood & affect appropriate.     Data Reviewed: I have personally reviewed following labs and imaging studies  CBC: Recent Labs  Lab 05/21/17 0317 05/22/17 0427 05/24/17 0312 05/25/17 0222 05/26/17 0312  WBC 8.2 7.4 7.7 6.7 8.0  HGB 9.2* 9.7* 10.4* 9.5* 9.9*  HCT 27.6* 29.5* 30.4* 28.5* 28.8*  MCV 83.6 83.6 83.5 83.6 83.5  PLT 348 350 352 298 101   Basic Metabolic Panel: Recent Labs  Lab 05/23/17 0605 05/24/17 0312 05/25/17 0222 05/26/17 0312 05/27/17 0247  NA 135 134* 135 136 136  K 5.1 5.1 4.8 4.5 4.5  CL 103 101 103 101 103  CO2 19* 20* 17* 19* 19*  GLUCOSE 124* 118* 104* 106* 118*  BUN 121* 122* 119* 120* 117*  CREATININE 6.96* 6.50* 5.84* 5.34*  4.86*  CALCIUM 8.7* 8.6* 8.5* 8.6* 8.6*  PHOS  --  8.0* 8.1* 7.6* 6.8*   GFR: Estimated Creatinine Clearance: 8.5 mL/min (A) (by C-G formula based on SCr of 4.86 mg/dL (H)). Liver Function Tests: Recent Labs  Lab 05/21/17 0317 05/24/17 0312 05/25/17 0222 05/26/17 0312 05/27/17 0247  AST 25  --   --   --   --   ALT 60*  --   --   --   --   ALKPHOS 56  --   --   --   --   BILITOT 0.6  --   --   --   --   PROT 5.1*  --   --   --   --   ALBUMIN 1.8* 2.1* 2.1* 2.2* 2.2*   No results for input(s): LIPASE, AMYLASE in the last 168 hours. No results for input(s): AMMONIA in the last 168 hours. Coagulation Profile: No results for input(s): INR, PROTIME in the last 168 hours. Cardiac Enzymes: Recent Labs  Lab 05/21/17 0317  CKTOTAL 114   BNP (last 3 results) No results for input(s): PROBNP in the  last 8760 hours. HbA1C: No results for input(s): HGBA1C in the last 72 hours. CBG: Recent Labs  Lab 05/26/17 0604 05/26/17 1107 05/26/17 1552 05/26/17 2046 05/27/17 0549  GLUCAP 102* 159* 134* 158* 124*   Lipid Profile: No results for input(s): CHOL, HDL, LDLCALC, TRIG, CHOLHDL, LDLDIRECT in the last 72 hours. Thyroid Function Tests: No results for input(s): TSH, T4TOTAL, FREET4, T3FREE, THYROIDAB in the last 72 hours. Anemia Panel: No results for input(s): VITAMINB12, FOLATE, FERRITIN, TIBC, IRON, RETICCTPCT in the last 72 hours. Sepsis Labs: No results for input(s): PROCALCITON, LATICACIDVEN in the last 168 hours.  No results found for this or any previous visit (from the past 240 hour(s)).       Radiology Studies: No results found.      Scheduled Meds: . amoxicillin-clavulanate  500 mg Oral Daily  . aspirin  81 mg Oral QPC breakfast  . carvedilol  3.125 mg Oral BID WC  . cholecalciferol  2,000 Units Oral Daily  . doxycycline  100 mg Oral BID  . heparin injection (subcutaneous)  5,000 Units Subcutaneous Q8H  . insulin aspart  0-5 Units Subcutaneous QHS  . insulin aspart  0-9 Units Subcutaneous TID WC  . isosorbide mononitrate  15 mg Oral Daily  . omega-3 acid ethyl esters  1,000 mg Oral Daily  . pantoprazole  40 mg Oral QPC breakfast  . senna-docusate  1 tablet Oral BID   Continuous Infusions:   LOS: 14 days    Time spent: 35 minutes    Irine Seal, MD [facility] Pager [phone]  If 7PM-7AM, please contact night-coverage [email or web address] Password [email or web address] 05/27/2017, 9:42 AM

## 2017-05-28 ENCOUNTER — Telehealth: Payer: Self-pay | Admitting: Physician Assistant

## 2017-05-28 DIAGNOSIS — M79601 Pain in right arm: Secondary | ICD-10-CM

## 2017-05-28 LAB — GLUCOSE, CAPILLARY
GLUCOSE-CAPILLARY: 118 mg/dL — AB (ref 65–99)
GLUCOSE-CAPILLARY: 150 mg/dL — AB (ref 65–99)
Glucose-Capillary: 120 mg/dL — ABNORMAL HIGH (ref 65–99)
Glucose-Capillary: 110 mg/dL — ABNORMAL HIGH (ref 65–99)
Glucose-Capillary: 155 mg/dL — ABNORMAL HIGH (ref 65–99)

## 2017-05-28 LAB — RENAL FUNCTION PANEL
Albumin: 2.2 g/dL — ABNORMAL LOW (ref 3.5–5.0)
Anion gap: 13 (ref 5–15)
BUN: 102 mg/dL — AB (ref 6–20)
CHLORIDE: 100 mmol/L — AB (ref 101–111)
CO2: 19 mmol/L — AB (ref 22–32)
CREATININE: 4.17 mg/dL — AB (ref 0.44–1.00)
Calcium: 8.5 mg/dL — ABNORMAL LOW (ref 8.9–10.3)
GFR calc Af Amer: 10 mL/min — ABNORMAL LOW (ref >60)
GFR, EST NON AFRICAN AMERICAN: 9 mL/min — AB (ref >60)
GLUCOSE: 108 mg/dL — AB (ref 65–99)
POTASSIUM: 3.9 mmol/L (ref 3.5–5.1)
Phosphorus: 5.5 mg/dL — ABNORMAL HIGH (ref 2.5–4.6)
Sodium: 132 mmol/L — ABNORMAL LOW (ref 135–145)

## 2017-05-28 LAB — CBC
HEMATOCRIT: 29.9 % — AB (ref 36.0–46.0)
Hemoglobin: 9.7 g/dL — ABNORMAL LOW (ref 12.0–15.0)
MCH: 27.5 pg (ref 26.0–34.0)
MCHC: 32.4 g/dL (ref 30.0–36.0)
MCV: 84.7 fL (ref 78.0–100.0)
PLATELETS: 288 10*3/uL (ref 150–400)
RBC: 3.53 MIL/uL — ABNORMAL LOW (ref 3.87–5.11)
RDW: 15.9 % — AB (ref 11.5–15.5)
WBC: 7.7 10*3/uL (ref 4.0–10.5)

## 2017-05-28 MED ORDER — ACETAMINOPHEN 500 MG PO TABS
500.0000 mg | ORAL_TABLET | Freq: Three times a day (TID) | ORAL | Status: DC
  Administered 2017-05-28 – 2017-05-29 (×5): 500 mg via ORAL
  Filled 2017-05-28 (×5): qty 1

## 2017-05-28 NOTE — Progress Notes (Signed)
Orthopedic Tech Progress Note Patient Details:  Chelsea [name] 05-07-1928 [id number]  Ortho Devices Type of Ortho Device: Prafo boot/shoe Ortho Device/Splint Location: lle Ortho Device/Splint Interventions: Application   Post Interventions Patient Tolerated: Well Instructions Provided: Care of device   [name] 05/28/2017, 2:47 PM

## 2017-05-28 NOTE — Telephone Encounter (Signed)
TOC Patient-Please call Patient-Patient has an appointment with Vin on 06-06-17.

## 2017-05-28 NOTE — Telephone Encounter (Signed)
Pt still in hospital 05/28/17

## 2017-05-28 NOTE — Progress Notes (Signed)
Physical Therapy Treatment Patient Details Name: Chelsea [name] MRN: [id number] DOB: [birth year] Today's Date: 05/28/2017    History of Present Illness 84y.o. F w/ a Hx of CVA, DM2 , HTN, Basal cell carcinoma of breast 1998, COPD, HLD, ITP, Hiatal hernia, and Chronic Osteomyelitis of L foot on chronic PO ABx who presented to the ED with c/o R shoulder pain.  She called EMS after being unable to get out of bed. Admitted 05/12/17 with NSTEMI    PT Comments    Patient progressing slowly towards her goals. Requiring less assist with bed mobility this session, progressing to sitting edge of bed with minimal assist. Use of Denna Haggard to transfer from bed to chair; patient unable to achieve full upright positioning secondary to LLE pain. Patient resting with L foot in extreme eversion (~45 degrees), but is able to achieve neutral passively. Recommending PRAFO boot for use in bed and chair to promote neutral positioning and prevent drop foot. Continue to highly recommend SNF.     Follow Up Recommendations  SNF     Equipment Recommendations  None recommended by PT    Recommendations for Other Services       Precautions / Restrictions Precautions Precautions: Fall Restrictions Weight Bearing Restrictions: No LLE Weight Bearing: Non weight bearing Other Position/Activity Restrictions: pt has difficulty bearing weight through L ankle.    Mobility  Bed Mobility Overal bed mobility: Needs Assistance Bed Mobility: Supine to Sit     Supine to sit: Min assist     General bed mobility comments: Patient able to progress BLE to edge of bed and use of rail to elevate trunk. Min assist to scoot L hip forward with chuck pad to edge of bed.   Transfers Overall transfer level: Needs assistance   Transfers: Sit to/from Stand Sit to Stand: +2 physical assistance;Max assist         General transfer comment: Max assist to boost up from sitting edge of bed to [employer]. Patient unable to achieve  full upright position secondary to pain; patient moaning and crying with eyes closed throughout transfer.   Ambulation/Gait                 Stairs             Wheelchair Mobility    Modified Rankin (Stroke Patients Only)       Balance Overall balance assessment: Needs assistance Sitting-balance support: Feet supported;No upper extremity supported Sitting balance-Leahy Scale: Fair Sitting balance - Comments: Pt fatigues quickly while on EOB, stating "I'm weak." Encouraged to put BUE in lap for practicing sitting balance.      Standing balance-Leahy Scale: Zero Standing balance comment: Use of Stedy                             Cognition Arousal/Alertness: Awake/alert Behavior During Therapy: WFL for tasks assessed/performed Overall Cognitive Status: Within Functional Limits for tasks assessed                                        Exercises General Exercises - Lower Extremity Ankle Circles/Pumps: Both;10 reps Long Arc Quad: Both;10 reps;Seated Other Exercises Other Exercises: AROM L foot inversion x 10     General Comments General comments (skin integrity, edema, etc.): Patient able to achieve neutral foot positioning passively; rests in ~45 degrees eversion.  Pertinent Vitals/Pain Pain Assessment: Faces Faces Pain Scale: Hurts whole lot Pain Location: Lt ankle with movement Pain Descriptors / Indicators: Burning;Aching;Sharp;Grimacing;Crying Pain Intervention(s): Limited activity within patient's tolerance;Monitored during session;Repositioned    Home Living                      Prior Function            PT Goals (current goals can now be found in the care plan section) Acute Rehab PT Goals Patient Stated Goal: get stronger PT Goal Formulation: With patient Time For Goal Achievement: 06/04/17 Potential to Achieve Goals: Good Progress towards PT goals: Progressing toward goals    Frequency    Min  2X/week      PT Plan Current plan remains appropriate    Co-evaluation              AM-PAC PT "6 Clicks" Daily Activity  Outcome Measure  Difficulty turning over in bed (including adjusting bedclothes, sheets and blankets)?: A Lot Difficulty moving from lying on back to sitting on the side of the bed? : Unable Difficulty sitting down on and standing up from a chair with arms (e.g., wheelchair, bedside commode, etc,.)?: Unable Help needed moving to and from a bed to chair (including a wheelchair)?: Total Help needed walking in hospital room?: Total Help needed climbing 3-5 steps with a railing? : Total 6 Click Score: 7    End of Session Equipment Utilized During Treatment: Gait belt Activity Tolerance: Patient limited by fatigue;Patient limited by pain Patient left: in chair;with call bell/phone within reach Nurse Communication: Mobility status PT Visit Diagnosis: Muscle weakness (generalized) (M62.81);Difficulty in walking, not elsewhere classified (R26.2);Pain Pain - Right/Left: Left Pain - part of body: Leg     Time: 3149-7026 PT Time Calculation (min) (ACUTE ONLY): 23 min  Charges:  $Therapeutic Exercise: 8-22 mins $Therapeutic Activity: 8-22 mins                    G Codes:       Ellamae Sia, PT, DPT Acute Rehabilitation Services  Pager: [place] 05/28/2017, 11:37 AM

## 2017-05-28 NOTE — Progress Notes (Signed)
PROGRESS NOTE    Chelsea [name]  NFA:[id number] DOB: August 06, 1928 DOA: 05/12/2017 PCP: Jani Gravel, MD    Brief Narrative:  [phone].o.F w/ a Hxof CVA,DM2, HTN,Basal cell carcinoma of breast1998, COPD, HLD, ITP,Hiatal hernia, and ChronicOsteomyelitis ofLfoot on chronic PO ABx who presented to the ED with c/o R shoulder pain. She called EMS after being unable to get out of bed.  Per EMS initial SBP was 70.  In the ED Trop was 3.5. EKG showed inferior ST depressions.      Assessment & Plan:   Principal Problem:   NSTEMI (non-ST elevated myocardial infarction) ([place]) Active Problems:   Diabetes mellitus ([place])   Chronic osteomyelitis of left foot ([facility])   HTN (hypertension)   Acute kidney failure ([facility])   Pressure injury of skin   Acute on chronic combined systolic and diastolic CHF (congestive heart failure) ([place])  #1 non-STEMI Presented with complaints of right shoulder pain noted to have initial systolic blood pressure in the 70s.  Troponins ordered were elevated.  EKG showed inferior ST wave depression.  2D echo was consistent with wall motion abnormalities in the territory of LAD disease.  Patient seen in consultation by cardiology and due to progressive kidney failure unable to undergo invasive procedures i.e. cardiac catheterization at this time and recommended medical management.  Patient currently chest pain-free.  Continue current regimen of Coreg, aspirin, Lovaza, imdur.  Nitro patch has been discontinued.  Due to worsening renal function unable to place on the ACE inhibitor.  Patient follow-up with cardiology in the outpatient setting.   2.  Acute systolic heart failure Secondary to acute MI in the LAD territory/coronary artery disease.  Patient noted to be in volume overload with severe renal disease.  Due to worsening renal function, nephrology managing diuretics.  Patient improving clinically.  Patient with a urine output of 2.250 L over the past 24 hours.  Patient is  -16.489 L during this hospitalization.  Continue beta-blocker, aspirin, lovaza, imdur.  Diuretics being dosed intermittently per nephrology.  Outpatient follow-up with cardiology.  3.  Acute renal failure Felt likely secondary to ATN versus pigment induced nephropathy as patient noted to have presented with relative hypotension, was on IV Vanco and Zosyn.  Creatinine was 0.80 on 4/7/ 2019.  Patient with a urine output of 2.250 L over the past 24 hours.  Patient is - 16.489 L during this hospitalization.  Creatinine trending down and improving daily and currently at 4.17 from 4.86 from 5.34 from 5.84 from 6.50 from 6.96 from 7.11.  Per nephrology.  Will likely need outpatient follow-up with nephrology.  4.  Chronic osteomyelitis left foot/lower extremity edema greater than right Patient with chronic ankle/foot osteomyelitis.  Patient currently afebrile.  Patient initially refused MRI however is in agreement for it and underwent MRI 05/23/2017.  MRI with myositis of left gastrocnemius, soleus, tibialis anterior muscles.  MRI negative for acute osteomyelitis.  No suspicious osseous lesions.  Tricompartmental osteoarthritis. Patient was on IV antibiotics and has been changed back to home oral dose antibiotic regimen.  Outpatient follow-up with Dr. Megan Salon, ID.  5.  Type 2 diabetes Hemoglobin A1c 6.8.  CBGs ranging from 110-123.  Continue SSI.     6 hypertension Stable.  Continue current regimen of Coreg and imdur.  7.  Altered mental status Improved.  Patient likely close to baseline at this time.  CT head negative for any acute abnormalities.  CT head with chronic moderate small vessel disease with atrophy concern for possible dementia  at baseline.  Also concerned that uremia may be playing a role.  Monitor closely.  8 COPD Stable.  Continue current nebulizers as needed.  9.  Right shoulder/right upper extremity pain Likely secondary to osteoarthritis.  Plain films of the right shoulder and right  upper extremity negative for any fracture or acute abnormalities, consistent with degenerative disease.  Continue warm compresses and pain management.  Place on scheduled Tylenol 500 mg 3 times daily.     DVT prophylaxis: Heparin Code Status: Full Family Communication: updated patient, no family at bedside. Disposition Plan: To skilled nursing facility if renal function continues to improve and resolution of right upper extremity pain hopefully tomorrow.    Consultants:   Nephrology: Dr. Posey Pronto 05/16/2017  Cardiology: Dr.Nahser 05/13/2017  Procedures:   CT head CT C-spine 05/13/2017  Chest x-ray 05/16/2017, 05/12/2017  Renal ultrasound 05/14/2017  2D echo 05/24/2017--- EF 40-45%, hypokinesis of mid apical, anteroseptal, anterior, and apical myocardium consistent with ischemia of LAD-grade 1 DD  Lower extremity Dopplers 05/20/2017  MRI left foot 05/23/2017  Plain films of the right shoulder, right humerus, right forearm 05/27/2017  Antimicrobials:   Augmentin 05/14/2017  Oral doxycycline 05/14/2017  IV Zosyn 05/13/2017>>>> 05/14/2017   Subjective: Patient states some improvement with right upper extremity shoulder and pain.  Denies any shortness of breath.  No chest pain.  States he took a lot out of her while getting bathed this morning.   Objective: Vitals:   05/27/17 1600 05/27/17 1749 05/27/17 1946 05/28/17 0429  BP: 116/64 116/80 (!) 119/56 124/63  Pulse:  85 76 74  Resp: 17  (!) 24 19  Temp: 98.9 F (37.2 C)  (!) 97.5 F (36.4 C) 98.7 F (37.1 C)  TempSrc: Axillary  Oral Oral  SpO2: 96%     Weight:    79.8 kg (175 lb 14.8 oz)  Height:        Intake/Output Summary (Last 24 hours) at 05/28/2017 1119 Last data filed at 05/28/2017 0908 Gross per 24 hour  Intake 600 ml  Output 1800 ml  Net -1200 ml   Filed Weights   05/26/17 0447 05/27/17 0514 05/28/17 0429  Weight: 82.2 kg (181 lb 3.5 oz) 80.2 kg (176 lb 12.9 oz) 79.8 kg (175 lb 14.8 oz)    Examination:  General  exam: NAD Respiratory system: CTAB.  No wheezes, no crackles, no rhonchi.  Cardiovascular system: Regular rate and rhythm no murmurs rubs or gallops.  Trace bilateral lower extremity edema.  No JVD.   Gastrointestinal system: Abdomen is soft, nontender, nondistended, positive bowel sounds.  No rebound.  No guarding.   Central nervous system: Alert and oriented. No focal neurological deficits. Extremities: Left lower extremity pedal trace edema.  Left lower extremity with deformed ankle limited ability to move left foot.  Some ecchymosis noted on the left ankle.  Right lower extremity with trace + edema.  Left ankle is floppy. Skin: No rashes, lesions or ulcers Psychiatry: Judgement and insight appear normal. Mood & affect appropriate.     Data Reviewed: I have personally reviewed following labs and imaging studies  CBC: Recent Labs  Lab 05/22/17 0427 05/24/17 0312 05/25/17 0222 05/26/17 0312 05/28/17 0342  WBC 7.4 7.7 6.7 8.0 7.7  HGB 9.7* 10.4* 9.5* 9.9* 9.7*  HCT 29.5* 30.4* 28.5* 28.8* 29.9*  MCV 83.6 83.5 83.6 83.5 84.7  PLT 350 352 298 301 557   Basic Metabolic Panel: Recent Labs  Lab 05/24/17 0312 05/25/17 0222 05/26/17 0312 05/27/17 0247 05/28/17  0342  NA 134* 135 136 136 132*  K 5.1 4.8 4.5 4.5 3.9  CL 101 103 101 103 100*  CO2 20* 17* 19* 19* 19*  GLUCOSE 118* 104* 106* 118* 108*  BUN 122* 119* 120* 117* 102*  CREATININE 6.50* 5.84* 5.34* 4.86* 4.17*  CALCIUM 8.6* 8.5* 8.6* 8.6* 8.5*  PHOS 8.0* 8.1* 7.6* 6.8* 5.5*   GFR: Estimated Creatinine Clearance: 9.9 mL/min (A) (by C-G formula based on SCr of 4.17 mg/dL (H)). Liver Function Tests: Recent Labs  Lab 05/24/17 0312 05/25/17 0222 05/26/17 0312 05/27/17 0247 05/28/17 0342  ALBUMIN 2.1* 2.1* 2.2* 2.2* 2.2*   No results for input(s): LIPASE, AMYLASE in the last 168 hours. No results for input(s): AMMONIA in the last 168 hours. Coagulation Profile: No results for input(s): INR, PROTIME in the last 168  hours. Cardiac Enzymes: No results for input(s): CKTOTAL, CKMB, CKMBINDEX, TROPONINI in the last 168 hours. BNP (last 3 results) No results for input(s): PROBNP in the last 8760 hours. HbA1C: No results for input(s): HGBA1C in the last 72 hours. CBG: Recent Labs  Lab 05/27/17 1129 05/27/17 1634 05/27/17 2059 05/28/17 0443 05/28/17 0610  GLUCAP 148* 152* 123* 120* 110*   Lipid Profile: No results for input(s): CHOL, HDL, LDLCALC, TRIG, CHOLHDL, LDLDIRECT in the last 72 hours. Thyroid Function Tests: No results for input(s): TSH, T4TOTAL, FREET4, T3FREE, THYROIDAB in the last 72 hours. Anemia Panel: No results for input(s): VITAMINB12, FOLATE, FERRITIN, TIBC, IRON, RETICCTPCT in the last 72 hours. Sepsis Labs: No results for input(s): PROCALCITON, LATICACIDVEN in the last 168 hours.  No results found for this or any previous visit (from the past 240 hour(s)).       Radiology Studies: Dg Shoulder Right  Result Date: 05/27/2017 CLINICAL DATA:  Shoulder pain, no known injury, subsequent encounter EXAM: RIGHT SHOULDER - 2+ VIEW COMPARISON:  05/12/2017 FINDINGS: Degenerative changes of the acromioclavicular joint are seen. Glenohumeral degenerative changes are noted as well. No acute fracture or dislocation is noted. The underlying bony thorax is within normal limits. IMPRESSION: No acute abnormality noted. Electronically Signed   By: Inez Catalina M.D.   On: 05/27/2017 15:06   Dg Forearm Right  Result Date: 05/27/2017 CLINICAL DATA:  Forearm pain, initial encounter EXAM: RIGHT FOREARM - 2 VIEW COMPARISON:  None. FINDINGS: There is no evidence of fracture or other focal bone lesions. Soft tissues are unremarkable. IMPRESSION: No acute abnormality noted. Electronically Signed   By: Inez Catalina M.D.   On: 05/27/2017 15:07   Dg Humerus Right  Result Date: 05/27/2017 CLINICAL DATA:  Right arm pain, no known injury EXAM: RIGHT HUMERUS - 2+ VIEW COMPARISON:  None. FINDINGS: Degenerative  changes of the glenohumeral articulation are seen. No acute fracture or dislocation is noted. No soft tissue abnormality is seen. IMPRESSION: No acute abnormality noted. Electronically Signed   By: Inez Catalina M.D.   On: 05/27/2017 15:07        Scheduled Meds: . acetaminophen  500 mg Oral TID  . amoxicillin-clavulanate  500 mg Oral Daily  . aspirin  81 mg Oral QPC breakfast  . carvedilol  3.125 mg Oral BID WC  . cholecalciferol  2,000 Units Oral Daily  . doxycycline  100 mg Oral BID  . heparin injection (subcutaneous)  5,000 Units Subcutaneous Q8H  . insulin aspart  0-5 Units Subcutaneous QHS  . insulin aspart  0-9 Units Subcutaneous TID WC  . isosorbide mononitrate  15 mg Oral Daily  . omega-3 acid  ethyl esters  1,000 mg Oral Daily  . pantoprazole  40 mg Oral QPC breakfast  . senna-docusate  1 tablet Oral BID   Continuous Infusions:   LOS: 15 days    Time spent: 35 minutes    Irine Seal, MD [facility] Pager [phone]  If 7PM-7AM, please contact night-coverage [email or web address] Password [facility] 05/28/2017, 11:19 AM

## 2017-05-28 NOTE — Progress Notes (Signed)
Chelsea [name]  Assessment/ Plan: Pt is a 84 y.o. yo female with history of a stroke, hypertension, diabetes, COPD, ITP chronic osteomyelitis of left foot on chronic p.o. antibiotics presented to the ER with NSTEMI and AKI.  Assessment/Plan:  #Acute kidney injury likely ATN in the setting of hypotension as well as exposure to vancomycin.  Per EMS SBP was 70s.  The creatinine level was 0.8 on 4/7.  The creatinine level elevated and peaked at 7.33.  She has urine output of 2250 cc in 24-hour and serum creatinine level is trending down to 4.17.  Mild hyponatremia noticed.  Patient likely has recovering ATN.  She needs to follow-up with nephrologist Dr. Hollie Salk on discharge.  #Acute coronary syndrome/NSTEMI: No catheter due to poor renal function.  No chest pain.  On Imdur and Coreg.  #Left foot osteomyelitis: On Augmentin and doxycycline.  #Hypertension: Blood pressure acceptable.  Continue to monitor.  #Acute CHF: Receiving intermittent Lasix.  Now having good urine output.   Subjective: Seen and examined at bedside.  Denies headache, dizziness, nausea vomiting chest pain shortness of breath. Objective Vital signs in last 24 hours: Vitals:   05/27/17 1749 05/27/17 1946 05/28/17 0429 05/28/17 1156  BP: 116/80 (!) 119/56 124/63 106/68  Pulse: 85 76 74 79  Resp:  (!) 24 19   Temp:  (!) 97.5 F (36.4 C) 98.7 F (37.1 C)   TempSrc:  Oral Oral   SpO2:    100%  Weight:   79.8 kg (175 lb 14.8 oz)   Height:       Weight change: -0.4 kg (-14.1 oz)  Intake/Output Summary (Last 24 hours) at 05/28/2017 1639 Last data filed at 05/28/2017 0908 Gross per 24 hour  Intake 480 ml  Output 1800 ml  Net -1320 ml       Labs: Basic Metabolic Panel: Recent Labs  Lab 05/26/17 0312 05/27/17 0247 05/28/17 0342  NA 136 136 132*  K 4.5 4.5 3.9  CL 101 103 100*  CO2 19* 19* 19*  GLUCOSE 106* 118* 108*  BUN 120* 117* 102*  CREATININE 5.34* 4.86* 4.17*   CALCIUM 8.6* 8.6* 8.5*  PHOS 7.6* 6.8* 5.5*   Liver Function Tests: Recent Labs  Lab 05/26/17 0312 05/27/17 0247 05/28/17 0342  ALBUMIN 2.2* 2.2* 2.2*   No results for input(s): LIPASE, AMYLASE in the last 168 hours. No results for input(s): AMMONIA in the last 168 hours. CBC: Recent Labs  Lab 05/22/17 0427 05/24/17 0312 05/25/17 0222 05/26/17 0312 05/28/17 0342  WBC 7.4 7.7 6.7 8.0 7.7  HGB 9.7* 10.4* 9.5* 9.9* 9.7*  HCT 29.5* 30.4* 28.5* 28.8* 29.9*  MCV 83.6 83.5 83.6 83.5 84.7  PLT 350 352 298 301 288   Cardiac Enzymes: No results for input(s): CKTOTAL, CKMB, CKMBINDEX, TROPONINI in the last 168 hours. CBG: Recent Labs  Lab 05/27/17 1634 05/27/17 2059 05/28/17 0443 05/28/17 0610 05/28/17 1203  GLUCAP 152* 123* 120* 110* 155*    Iron Studies: No results for input(s): IRON, TIBC, TRANSFERRIN, FERRITIN in the last 72 hours. Studies/Results: Dg Shoulder Right  Result Date: 05/27/2017 CLINICAL DATA:  Shoulder pain, no known injury, subsequent encounter EXAM: RIGHT SHOULDER - 2+ VIEW COMPARISON:  05/12/2017 FINDINGS: Degenerative changes of the acromioclavicular joint are seen. Glenohumeral degenerative changes are noted as well. No acute fracture or dislocation is noted. The underlying bony thorax is within normal limits. IMPRESSION: No acute abnormality noted. Electronically Signed   By: Linus Mako.D.  On: 05/27/2017 15:06   Dg Forearm Right  Result Date: 05/27/2017 CLINICAL DATA:  Forearm pain, initial encounter EXAM: RIGHT FOREARM - 2 VIEW COMPARISON:  None. FINDINGS: There is no evidence of fracture or other focal bone lesions. Soft tissues are unremarkable. IMPRESSION: No acute abnormality noted. Electronically Signed   By: Inez Catalina M.D.   On: 05/27/2017 15:07   Dg Humerus Right  Result Date: 05/27/2017 CLINICAL DATA:  Right arm pain, no known injury EXAM: RIGHT HUMERUS - 2+ VIEW COMPARISON:  None. FINDINGS: Degenerative changes of the glenohumeral  articulation are seen. No acute fracture or dislocation is noted. No soft tissue abnormality is seen. IMPRESSION: No acute abnormality noted. Electronically Signed   By: Inez Catalina M.D.   On: 05/27/2017 15:07    Medications: Infusions:   Scheduled Medications: . acetaminophen  500 mg Oral TID  . amoxicillin-clavulanate  500 mg Oral Daily  . aspirin  81 mg Oral QPC breakfast  . carvedilol  3.125 mg Oral BID WC  . cholecalciferol  2,000 Units Oral Daily  . doxycycline  100 mg Oral BID  . heparin injection (subcutaneous)  5,000 Units Subcutaneous Q8H  . insulin aspart  0-5 Units Subcutaneous QHS  . insulin aspart  0-9 Units Subcutaneous TID WC  . isosorbide mononitrate  15 mg Oral Daily  . omega-3 acid ethyl esters  1,000 mg Oral Daily  . pantoprazole  40 mg Oral QPC breakfast  . senna-docusate  1 tablet Oral BID    have reviewed scheduled and prn medications.  Physical Exam: General: Not in distress Heart: Regular rate rhythm S1-S2 normal, no rubs Lungs: Lungs clear bilateral, no wheezing Abdomen: Soft, nontender.  Bowel sounds positive Extremities: Bilateral nonpitting edema.   Ellayna Hilligoss Prasad Jencarlo Bonadonna 05/28/2017,4:39 PM  LOS: 15 days

## 2017-05-28 NOTE — Clinical Social Work Note (Signed)
Visited with patient regarding SNF facility decision. Chelsea [name] was sitting up in a chair at bedside and was alert, oriented and open to talking with CSW. Patient reported that her daughter was out looking at facilities. CSW provided patient with name and phone number to give to daughter regarding facility decision.  Received call from [place] at [facility]. Daughter was at facility and spoke with CSW, and had chosen [facility]. Chelsea [name] advised that she will be contacted on Tuesday once transport called. CSW will continue to follow and facilitate discharge to [facility] on Tuesday, 4/23.  Chelsea [name], MSW, LCSW Licensed Clinical Social Worker [place] [phone]

## 2017-05-29 DIAGNOSIS — D649 Anemia, unspecified: Secondary | ICD-10-CM | POA: Diagnosis not present

## 2017-05-29 DIAGNOSIS — I2510 Atherosclerotic heart disease of native coronary artery without angina pectoris: Secondary | ICD-10-CM | POA: Diagnosis not present

## 2017-05-29 DIAGNOSIS — Z7982 Long term (current) use of aspirin: Secondary | ICD-10-CM | POA: Diagnosis not present

## 2017-05-29 DIAGNOSIS — Z803 Family history of malignant neoplasm of breast: Secondary | ICD-10-CM | POA: Diagnosis not present

## 2017-05-29 DIAGNOSIS — I5040 Unspecified combined systolic (congestive) and diastolic (congestive) heart failure: Secondary | ICD-10-CM | POA: Diagnosis not present

## 2017-05-29 DIAGNOSIS — M1990 Unspecified osteoarthritis, unspecified site: Secondary | ICD-10-CM | POA: Diagnosis not present

## 2017-05-29 DIAGNOSIS — I110 Hypertensive heart disease with heart failure: Secondary | ICD-10-CM | POA: Diagnosis not present

## 2017-05-29 DIAGNOSIS — Z9842 Cataract extraction status, left eye: Secondary | ICD-10-CM | POA: Diagnosis not present

## 2017-05-29 DIAGNOSIS — N170 Acute kidney failure with tubular necrosis: Secondary | ICD-10-CM | POA: Diagnosis not present

## 2017-05-29 DIAGNOSIS — Z9889 Other specified postprocedural states: Secondary | ICD-10-CM | POA: Diagnosis not present

## 2017-05-29 DIAGNOSIS — Z853 Personal history of malignant neoplasm of breast: Secondary | ICD-10-CM | POA: Diagnosis not present

## 2017-05-29 DIAGNOSIS — E785 Hyperlipidemia, unspecified: Secondary | ICD-10-CM | POA: Diagnosis not present

## 2017-05-29 DIAGNOSIS — K219 Gastro-esophageal reflux disease without esophagitis: Secondary | ICD-10-CM | POA: Diagnosis not present

## 2017-05-29 DIAGNOSIS — E1122 Type 2 diabetes mellitus with diabetic chronic kidney disease: Secondary | ICD-10-CM | POA: Diagnosis not present

## 2017-05-29 DIAGNOSIS — I129 Hypertensive chronic kidney disease with stage 1 through stage 4 chronic kidney disease, or unspecified chronic kidney disease: Secondary | ICD-10-CM | POA: Diagnosis not present

## 2017-05-29 DIAGNOSIS — N179 Acute kidney failure, unspecified: Secondary | ICD-10-CM | POA: Diagnosis not present

## 2017-05-29 DIAGNOSIS — Z9841 Cataract extraction status, right eye: Secondary | ICD-10-CM | POA: Diagnosis not present

## 2017-05-29 DIAGNOSIS — I214 Non-ST elevation (NSTEMI) myocardial infarction: Secondary | ICD-10-CM | POA: Diagnosis not present

## 2017-05-29 DIAGNOSIS — R54 Age-related physical debility: Secondary | ICD-10-CM | POA: Diagnosis not present

## 2017-05-29 DIAGNOSIS — R351 Nocturia: Secondary | ICD-10-CM | POA: Diagnosis not present

## 2017-05-29 DIAGNOSIS — I252 Old myocardial infarction: Secondary | ICD-10-CM | POA: Diagnosis not present

## 2017-05-29 DIAGNOSIS — G8911 Acute pain due to trauma: Secondary | ICD-10-CM | POA: Diagnosis not present

## 2017-05-29 DIAGNOSIS — Z85828 Personal history of other malignant neoplasm of skin: Secondary | ICD-10-CM | POA: Diagnosis not present

## 2017-05-29 DIAGNOSIS — I5043 Acute on chronic combined systolic (congestive) and diastolic (congestive) heart failure: Secondary | ICD-10-CM | POA: Diagnosis not present

## 2017-05-29 DIAGNOSIS — L97509 Non-pressure chronic ulcer of other part of unspecified foot with unspecified severity: Secondary | ICD-10-CM | POA: Diagnosis not present

## 2017-05-29 DIAGNOSIS — Z79899 Other long term (current) drug therapy: Secondary | ICD-10-CM | POA: Diagnosis not present

## 2017-05-29 DIAGNOSIS — M810 Age-related osteoporosis without current pathological fracture: Secondary | ICD-10-CM | POA: Diagnosis not present

## 2017-05-29 DIAGNOSIS — M25511 Pain in right shoulder: Secondary | ICD-10-CM | POA: Diagnosis not present

## 2017-05-29 DIAGNOSIS — I10 Essential (primary) hypertension: Secondary | ICD-10-CM | POA: Diagnosis not present

## 2017-05-29 DIAGNOSIS — E1152 Type 2 diabetes mellitus with diabetic peripheral angiopathy with gangrene: Secondary | ICD-10-CM | POA: Diagnosis not present

## 2017-05-29 DIAGNOSIS — I5022 Chronic systolic (congestive) heart failure: Secondary | ICD-10-CM | POA: Diagnosis not present

## 2017-05-29 DIAGNOSIS — R1311 Dysphagia, oral phase: Secondary | ICD-10-CM | POA: Diagnosis not present

## 2017-05-29 DIAGNOSIS — E8770 Fluid overload, unspecified: Secondary | ICD-10-CM | POA: Diagnosis not present

## 2017-05-29 DIAGNOSIS — M6281 Muscle weakness (generalized): Secondary | ICD-10-CM | POA: Diagnosis not present

## 2017-05-29 DIAGNOSIS — Z794 Long term (current) use of insulin: Secondary | ICD-10-CM | POA: Diagnosis not present

## 2017-05-29 DIAGNOSIS — R488 Other symbolic dysfunctions: Secondary | ICD-10-CM | POA: Diagnosis not present

## 2017-05-29 DIAGNOSIS — H409 Unspecified glaucoma: Secondary | ICD-10-CM | POA: Diagnosis not present

## 2017-05-29 DIAGNOSIS — E1169 Type 2 diabetes mellitus with other specified complication: Secondary | ICD-10-CM | POA: Diagnosis not present

## 2017-05-29 DIAGNOSIS — Z8249 Family history of ischemic heart disease and other diseases of the circulatory system: Secondary | ICD-10-CM | POA: Diagnosis not present

## 2017-05-29 DIAGNOSIS — R531 Weakness: Secondary | ICD-10-CM | POA: Diagnosis not present

## 2017-05-29 DIAGNOSIS — N183 Chronic kidney disease, stage 3 (moderate): Secondary | ICD-10-CM | POA: Diagnosis not present

## 2017-05-29 DIAGNOSIS — K5901 Slow transit constipation: Secondary | ICD-10-CM | POA: Diagnosis not present

## 2017-05-29 DIAGNOSIS — Z8673 Personal history of transient ischemic attack (TIA), and cerebral infarction without residual deficits: Secondary | ICD-10-CM | POA: Diagnosis not present

## 2017-05-29 DIAGNOSIS — E119 Type 2 diabetes mellitus without complications: Secondary | ICD-10-CM | POA: Diagnosis not present

## 2017-05-29 DIAGNOSIS — E890 Postprocedural hypothyroidism: Secondary | ICD-10-CM | POA: Diagnosis not present

## 2017-05-29 DIAGNOSIS — M8660 Other chronic osteomyelitis, unspecified site: Secondary | ICD-10-CM | POA: Diagnosis not present

## 2017-05-29 DIAGNOSIS — L988 Other specified disorders of the skin and subcutaneous tissue: Secondary | ICD-10-CM | POA: Diagnosis not present

## 2017-05-29 DIAGNOSIS — Z888 Allergy status to other drugs, medicaments and biological substances status: Secondary | ICD-10-CM | POA: Diagnosis not present

## 2017-05-29 DIAGNOSIS — M86672 Other chronic osteomyelitis, left ankle and foot: Secondary | ICD-10-CM | POA: Diagnosis not present

## 2017-05-29 DIAGNOSIS — J439 Emphysema, unspecified: Secondary | ICD-10-CM | POA: Diagnosis not present

## 2017-05-29 DIAGNOSIS — I130 Hypertensive heart and chronic kidney disease with heart failure and stage 1 through stage 4 chronic kidney disease, or unspecified chronic kidney disease: Secondary | ICD-10-CM | POA: Diagnosis not present

## 2017-05-29 DIAGNOSIS — N189 Chronic kidney disease, unspecified: Secondary | ICD-10-CM | POA: Diagnosis not present

## 2017-05-29 DIAGNOSIS — E1159 Type 2 diabetes mellitus with other circulatory complications: Secondary | ICD-10-CM | POA: Diagnosis not present

## 2017-05-29 DIAGNOSIS — M79642 Pain in left hand: Secondary | ICD-10-CM | POA: Diagnosis not present

## 2017-05-29 DIAGNOSIS — Z800 Family history of malignant neoplasm of digestive organs: Secondary | ICD-10-CM | POA: Diagnosis not present

## 2017-05-29 DIAGNOSIS — R4182 Altered mental status, unspecified: Secondary | ICD-10-CM | POA: Diagnosis not present

## 2017-05-29 LAB — RENAL FUNCTION PANEL
ANION GAP: 12 (ref 5–15)
Albumin: 2.2 g/dL — ABNORMAL LOW (ref 3.5–5.0)
BUN: 105 mg/dL — ABNORMAL HIGH (ref 6–20)
CALCIUM: 8.6 mg/dL — AB (ref 8.9–10.3)
CHLORIDE: 103 mmol/L (ref 101–111)
CO2: 20 mmol/L — AB (ref 22–32)
Creatinine, Ser: 3.76 mg/dL — ABNORMAL HIGH (ref 0.44–1.00)
GFR calc non Af Amer: 10 mL/min — ABNORMAL LOW (ref >60)
GFR, EST AFRICAN AMERICAN: 12 mL/min — AB (ref >60)
GLUCOSE: 119 mg/dL — AB (ref 65–99)
POTASSIUM: 4 mmol/L (ref 3.5–5.1)
Phosphorus: 5.5 mg/dL — ABNORMAL HIGH (ref 2.5–4.6)
Sodium: 135 mmol/L (ref 135–145)

## 2017-05-29 LAB — GLUCOSE, CAPILLARY
GLUCOSE-CAPILLARY: 126 mg/dL — AB (ref 65–99)
GLUCOSE-CAPILLARY: 201 mg/dL — AB (ref 65–99)

## 2017-05-29 LAB — MAGNESIUM: Magnesium: 1.2 mg/dL — ABNORMAL LOW (ref 1.7–2.4)

## 2017-05-29 MED ORDER — MAGNESIUM SULFATE 4 GM/100ML IV SOLN
4.0000 g | Freq: Once | INTRAVENOUS | Status: AC
  Administered 2017-05-29: 4 g via INTRAVENOUS
  Filled 2017-05-29: qty 100

## 2017-05-29 MED ORDER — MAGNESIUM OXIDE 400 (241.3 MG) MG PO TABS: 400.0000 mg | ORAL_TABLET | Freq: Two times a day (BID) | ORAL | 0 refills | Status: AC

## 2017-05-29 MED ORDER — INSULIN ASPART 100 UNIT/ML ~~LOC~~ SOLN: 0.0000 [IU] | Freq: Three times a day (TID) | SUBCUTANEOUS | 11 refills | Status: DC

## 2017-05-29 MED ORDER — MAGNESIUM SULFATE 2 GM/50ML IV SOLN: 2.0000 g | Freq: Once | INTRAVENOUS | Status: DC

## 2017-05-29 MED ORDER — HYDROXYZINE HCL 10 MG PO TABS: 10.0000 mg | ORAL_TABLET | Freq: Four times a day (QID) | ORAL | 0 refills | Status: DC | PRN

## 2017-05-29 MED ORDER — HYDROCODONE-ACETAMINOPHEN 5-325 MG PO TABS: 1.0000 | ORAL_TABLET | Freq: Four times a day (QID) | ORAL | 0 refills | Status: DC | PRN

## 2017-05-29 MED ORDER — CARVEDILOL 3.125 MG PO TABS: 3.1250 mg | ORAL_TABLET | Freq: Two times a day (BID) | ORAL | 0 refills | Status: AC

## 2017-05-29 MED ORDER — NITROGLYCERIN 0.4 MG SL SUBL: 0.4000 mg | SUBLINGUAL_TABLET | SUBLINGUAL | 0 refills | Status: AC | PRN

## 2017-05-29 MED ORDER — MAGNESIUM OXIDE 400 (241.3 MG) MG PO TABS
400.0000 mg | ORAL_TABLET | Freq: Two times a day (BID) | ORAL | Status: DC
  Administered 2017-05-29: 400 mg via ORAL
  Filled 2017-05-29: qty 1

## 2017-05-29 MED ORDER — INSULIN ASPART 100 UNIT/ML ~~LOC~~ SOLN: 0.0000 [IU] | Freq: Every day | SUBCUTANEOUS | 11 refills | Status: DC

## 2017-05-29 MED ORDER — SENNOSIDES-DOCUSATE SODIUM 8.6-50 MG PO TABS: 1.0000 | ORAL_TABLET | Freq: Every day | ORAL | Status: DC

## 2017-05-29 MED ORDER — ACETAMINOPHEN 500 MG PO TABS: 500.0000 mg | ORAL_TABLET | Freq: Three times a day (TID) | ORAL | 0 refills | Status: AC

## 2017-05-29 MED ORDER — ISOSORBIDE MONONITRATE ER 30 MG PO TB24: 15.0000 mg | ORAL_TABLET | Freq: Every day | ORAL | 0 refills | Status: AC

## 2017-05-29 NOTE — Discharge Summary (Addendum)
Physician Discharge Summary  Chelsea [name] JME:[id number] DOB: 06/08/1928 DOA: 05/12/2017  PCP: Jani Gravel, MD  Admit date: 05/12/2017 Discharge date: 05/29/2017  Time spent: 60 minutes  Recommendations for Outpatient Follow-up:  1. Patient will be discharged to skilled nursing facility.  Follow-up with MD at skilled nursing facility. 2. Follow-up with Leanor Kail, PAcardiology on 06/06/2017 for follow-up on non-STEMI and acute on chronic combined systolic and diastolic heart failure.  3. Follow-up with Dr. Hollie Salk, nephrology 06/05/2017 at 2 PM.  On follow-up patient will need a renal panel done and magnesium level to follow-up on electrolytes and renal function.  Initiation of diuretics will be deferred until patient is seen by nephrology in the outpatient setting.   Discharge Diagnoses:  Principal Problem:   NSTEMI (non-ST elevated myocardial infarction) ([place]) Active Problems:   Diabetes mellitus (HCC)   Chronic osteomyelitis of left foot (HCC)   HTN (hypertension)   Acute kidney failure (HCC)   Pressure injury of skin   Acute on chronic combined systolic and diastolic CHF (congestive heart failure) ([place])   Discharge Condition: stable and improved  Diet recommendation: Heart Healthy  Filed Weights   05/27/17 0514 05/28/17 0429 05/29/17 0508  Weight: 80.2 kg (176 lb 12.9 oz) 79.8 kg (175 lb 14.8 oz) 79 kg (174 lb 2.6 oz)    History of present illness:  Per Dr. Charlyne Quale is a 84 y.o. female with medical history significant of DM, HTN, chronic osteomyelitis of L foot currently being treated with PO ABx.  Patient presented to the ED with c/o R shoulder pain.  She apparently called EMS after being unable to get out of bed.  Patient denied falls to admitting physician (different than EDP history). She stated that R shoulder pain has been ongoing for about 1 week.  L shoulder also started to hurt around Thurs.  She reported trouble getting out of chairs and out of  bed recently.  (I suspect patient may have underlying dementia of some degree vs delirium.)  Per EMS initial SBP was 70.  ED Course: Trop is 3.5.  EKG showed inferior ST depressions.  Patient given zosyn / vanc for her foot ulcer by EDP.  Cultures ordered.  Cards consulted and hospitalist asked to admit.     Hospital Course:  1 non-STEMI Presented with complaints of right shoulder pain noted to have initial systolic blood pressure in the 70s.  Troponins ordered were elevated.  EKG showed inferior ST wave depression.  2D echo was consistent with wall motion abnormalities in the territory of LAD disease.  Patient seen in consultation by cardiology and due to progressive kidney failure unable to undergo invasive procedures i.e. cardiac catheterization at this time and recommended medical management.  Patient remained chest pain-free for the rest of the hospitalization.  Patient was started on Coreg, aspirin, Lovaza, imdur.  Nitro patch  discontinued.  Due to worsening renal function unable to place on the ACE inhibitor.  Patient follow-up with cardiology in the outpatient setting.   2.  Acute systolic heart failure Secondary to acute MI in the LAD territory/coronary artery disease.  Patient noted to be in volume overload with severe renal disease.  Due to worsening renal function, nephrology managed diuretics due to hospitalization.  Patient improved clinically during the hospitalization.  Urine output continued to improve.  Patient was -18.3 L during this hospitalization.  ARB was discontinued.  Patient started on beta-blocker, aspirin, Lovaza and imdur.  Patient improved clinically and will follow up  with cardiology and nephrology in the outpatient setting.   3.  Acute renal failure Felt likely secondary to ATN versus pigment induced nephropathy as patient noted to have presented with relative hypotension, was on IV Vanco and Zosyn.  Creatinine was 0.80 on 4/7/ 2019.  Nephrology was  consulted and patient was followed.  Nephrotoxins were discontinued.  Patient was placed on intermittent Lasix per nephrology during the hospitalization and monitored.  Patient's urine output picked up and improved on a daily basis.  Renal function slowly trended down from 7.11 during the hospitalization with improving good urine output such that by day of discharge creatinine was down to  3.76.  Patient will follow up with Dr. Hollie Salk of nephrology June 05, 2017 for hospital follow-up and further management.  Will defer initiation of diuretics to Dr. Hollie Salk in the outpatient setting.  Follow.   4.  Chronic osteomyelitis left foot/lower extremity edema greater than right Patient with chronic ankle/foot osteomyelitis.  Patient remained afebrile during the hospitalization.  Patient initially refused MRI however agreed to get it done and underwent MRI 05/23/2017.  MRI with myositis of left gastrocnemius, soleus, tibialis anterior muscles.  MRI negative for acute osteomyelitis.  No suspicious osseous lesions.  Tricompartmental osteoarthritis. Patient was on IV antibiotics and changed back to home oral dose antibiotic regimen.  Outpatient follow-up with Dr. Megan Salon, ID.  5.  Type 2 diabetes Hemoglobin A1c 6.8.  She has metformin was held during the hospitalization.  Patient was maintained on sliding scale insulin.  Patient's metformin was discontinued on discharge as patient had presented with non-STEMI and acute renal failure.  Outpatient follow-up.  Patient will be discharged on a  sliding scale insulin.  Will defer further management to outpatient setting.   6 hypertension Blood pressure remained controlled during the hospitalization.  Patient's Cozaar was discontinued as patient had presented with acute renal failure.  Patient was maintained on Coreg and imdur for blood pressure control.  Outpatient follow-up.    7.  Altered mental status Improved.  Patient likely close to baseline at this time.  CT  head negative for any acute abnormalities.  CT head with chronic moderate small vessel disease with atrophy concern for possible dementia at baseline.  Patient was back to baseline by day of discharge.  8 COPD Remained stable throughout the hospitalization.  Patient was placed on nebulizers as needed.    9.  Right shoulder/right upper extremity pain Likely secondary to osteoarthritis.  Plain films of the right shoulder and right upper extremity were negative for any fracture or acute abnormalities, consistent with degenerative disease.    Patient was placed on scheduled Tylenol 500 mg 3 times daily as well as warm compresses and pain management with clinical improvement.  Outpatient follow-up.       Procedures:  CT head CT C-spine 05/13/2017  Chest x-ray 05/16/2017, 05/12/2017  Renal ultrasound 05/14/2017  2D echo 05/24/2017--- EF 40-45%, hypokinesis of mid apical, anteroseptal, anterior, and apical myocardium consistent with ischemia of LAD-grade 1 DD  Lower extremity Dopplers 05/20/2017  MRI left foot 05/23/2017  Plain films of the right shoulder, right humerus, right forearm 05/27/2017      Consultations:  Nephrology: Dr. Posey Pronto 05/16/2017  Cardiology: Dr.Nahser 05/13/2017      Discharge Exam: Vitals:   05/28/17 2034 05/29/17 1017  BP:  127/64  Pulse: 74 79  Resp: (!) 24   Temp: 97.9 F (36.6 C)   SpO2: 96%     General: NAD Cardiovascular: RRR Respiratory: CTAB  Discharge Instructions   Discharge Instructions    Diet - low sodium heart healthy   Complete by:  As directed    Increase activity slowly   Complete by:  As directed      Allergies as of 05/29/2017      Reactions   Travatan Z [travoprost (bak Free)] Other (See Comments)   dizziness      Medication List    STOP taking these medications   furosemide 20 MG tablet Commonly known as:  LASIX   losartan 100 MG tablet Commonly known as:  COZAAR   metFORMIN 500 MG tablet Commonly known as:   GLUCOPHAGE     TAKE these medications   acetaminophen 500 MG tablet Commonly known as:  TYLENOL Take 1 tablet (500 mg total) by mouth 3 (three) times daily for 7 days.   amoxicillin-clavulanate 875-125 MG tablet Commonly known as:  AUGMENTIN Take 1 tablet by mouth 2 (two) times daily.   aspirin 81 MG tablet Take 81 mg by mouth daily after breakfast.   carvedilol 3.125 MG tablet Commonly known as:  COREG Take 1 tablet (3.125 mg total) by mouth 2 (two) times daily with a meal.   doxycycline 100 MG capsule Commonly known as:  MONODOX Take 1 capsule (100 mg total) by mouth 2 (two) times daily.   HYDROcodone-acetaminophen 5-325 MG tablet Commonly known as:  NORCO/VICODIN Take 1-2 tablets by mouth every 6 (six) hours as needed for up to 5 days for moderate pain or severe pain.   hydrOXYzine 10 MG tablet Commonly known as:  ATARAX/VISTARIL Take 1 tablet (10 mg total) by mouth every 6 (six) hours as needed for itching.   insulin aspart 100 UNIT/ML injection Commonly known as:  novoLOG Inject 0-5 Units into the skin at bedtime.   insulin aspart 100 UNIT/ML injection Commonly known as:  novoLOG Inject 0-9 Units into the skin 3 (three) times daily with meals.   isosorbide mononitrate 30 MG 24 hr tablet Commonly known as:  IMDUR Take 0.5 tablets (15 mg total) by mouth daily. Start taking on:  05/30/2017   magnesium oxide 400 (241.3 Mg) MG tablet Commonly known as:  MAG-OX Take 1 tablet (400 mg total) by mouth 2 (two) times daily.   nitroGLYCERIN 0.4 MG SL tablet Commonly known as:  NITROSTAT Place 1 tablet (0.4 mg total) under the tongue every 5 (five) minutes x 3 doses as needed for chest pain.   Omega 3 1200 MG Caps Take 1,200 mg by mouth 2 (two) times daily.   pantoprazole 40 MG tablet Commonly known as:  PROTONIX Take 40 mg by mouth daily after breakfast.   senna-docusate 8.6-50 MG tablet Commonly known as:  Senokot-S Take 1 tablet by mouth at bedtime.   Vitamin  D3 2000 units Tabs Take 2,000 Units by mouth daily.            Durable Medical Equipment  (From admission, onward)        Start     Ordered   05/25/17 1130  For home use only DME Tub bench  Once     05/25/17 1129   05/25/17 1129  For home use only DME 3 n 1  Once     05/25/17 1129     Allergies  Allergen Reactions  . Travatan Z [Travoprost (Bak Free)] Other (See Comments)    dizziness   Follow-up Information    [place], Crista Luria, PA Follow up on 06/06/2017.   Specialty:  Cardiology Why:  Please arrive 15 minutes early for your 9:00am appointment Contact information: [address] [place] [address] [phone]        Madelon Lips, MD. Schedule an appointment as soon as possible for a visit on 06/05/2017.   Specialty:  Nephrology Why:  F/U AT 2PM. please CHECK IN AT 130PM Contact information: [place] [address] [phone]        MD AT SNF Follow up.            The results of significant diagnostics from this hospitalization (including imaging, microbiology, ancillary and laboratory) are listed below for reference.    Significant Diagnostic Studies: Dg Chest 2 View  Result Date: 05/13/2017 CLINICAL DATA:  84 y/o  F; right shoulder pain.  Hypotension. EXAM: CHEST - 2 VIEW COMPARISON:  01/09/2008 chest CT. FINDINGS: Stable enlarged cardiac silhouette given projection and technique. Calcific aortic atherosclerosis. Large hiatal hernia. Pulmonary vascular congestion. No pleural effusion or pneumothorax. Bones are unremarkable. IMPRESSION: Large hiatal hernia. Pulmonary vascular congestion. Mild cardiomegaly. Aortic atherosclerosis. Electronically Signed   By: Kristine Garbe M.D.   On: 05/13/2017 00:13   Dg Shoulder Right  Result Date: 05/27/2017 CLINICAL DATA:  Shoulder pain, no known injury, subsequent encounter EXAM: RIGHT SHOULDER - 2+ VIEW COMPARISON:  05/12/2017 FINDINGS: Degenerative changes of the  acromioclavicular joint are seen. Glenohumeral degenerative changes are noted as well. No acute fracture or dislocation is noted. The underlying bony thorax is within normal limits. IMPRESSION: No acute abnormality noted. Electronically Signed   By: Inez Catalina M.D.   On: 05/27/2017 15:06   Dg Shoulder Right  Result Date: 05/13/2017 CLINICAL DATA:  84 y/o  F; 2 weeks of right shoulder pain. EXAM: RIGHT SHOULDER - 2+ VIEW COMPARISON:  None. FINDINGS: There is no evidence of fracture or dislocation. Mild osteoarthrosis of the glenohumeral joint with small osteophytes at the inferior margin of humeral head. Soft tissues are unremarkable. IMPRESSION: 1.  No acute fracture or dislocation identified. 2. Mild osteoarthrosis of the glenohumeral joint. Electronically Signed   By: Kristine Garbe M.D.   On: 05/13/2017 00:16   Dg Forearm Right  Result Date: 05/27/2017 CLINICAL DATA:  Forearm pain, initial encounter EXAM: RIGHT FOREARM - 2 VIEW COMPARISON:  None. FINDINGS: There is no evidence of fracture or other focal bone lesions. Soft tissues are unremarkable. IMPRESSION: No acute abnormality noted. Electronically Signed   By: Inez Catalina M.D.   On: 05/27/2017 15:07   Ct Head Wo Contrast  Result Date: 05/13/2017 CLINICAL DATA:  Hypotensive. History of stroke in 2010. Right shoulder pain. EXAM: CT HEAD WITHOUT CONTRAST CT CERVICAL SPINE WITHOUT CONTRAST TECHNIQUE: Multidetector CT imaging of the head and cervical spine was performed following the standard protocol without intravenous contrast. Multiplanar CT image reconstructions of the cervical spine were also generated. COMPARISON:  CT and MRI exams of the brain from 02/03/2009 FINDINGS: CT HEAD FINDINGS Brain: Atrophy with chronic moderate small vessel ischemia. No large vascular territory infarct. No midline shift or edema. No intra-axial mass nor extra-axial fluid collections. Midline fourth ventricle and basal cisterns without effacement. Vascular:  Atherosclerosis of the cavernous carotid arteries bilaterally. No hyperdense vessel sign. Skull: No skull fracture or suspicious osseous lesions. Sinuses/Orbits: Bilateral cataract extractions. Mild ethmoid sinus mucosal thickening. No acute sinus disease. No mastoid effusion. Other: None CT CERVICAL SPINE FINDINGS Alignment: Maintained cervical lordosis. Intact craniocervical relationship and atlantodental interval. No jumped or perched facets. Skull base and vertebrae: Intact skull base.  No vertebral fracture. Soft tissues and spinal canal: No prevertebral soft tissue swelling. No visible canal hematoma. Disc levels: Moderate to marked disc space narrowing at C5-6 and C6-7. Uncovertebral joint osteoarthritis noted at C5-6 and C6-7 bilaterally contributing to mild left C5-6 and mild right C6-7 neural foraminal encroachment. Multilevel degenerative facet arthropathy is visualized most prominent with subcortical cystic change at C3-4 on the left. Upper chest: Negative Other: None IMPRESSION: 1. Atrophy with chronic moderate small vessel ischemia. No acute intracranial abnormality. 2. No acute cervical spine fracture or listhesis. 3. Degenerative disc disease C5-6 and C6-7 with uncovertebral joint osteoarthritis and uncinate spurring contributing to mild left C5-6 and right C6-7 neural foraminal encroachment. Multilevel degenerative facet arthropathy noted. Electronically Signed   By: Ashley Royalty M.D.   On: 05/13/2017 01:40   Ct Cervical Spine Wo Contrast  Result Date: 05/13/2017 CLINICAL DATA:  Hypotensive. History of stroke in 2010. Right shoulder pain. EXAM: CT HEAD WITHOUT CONTRAST CT CERVICAL SPINE WITHOUT CONTRAST TECHNIQUE: Multidetector CT imaging of the head and cervical spine was performed following the standard protocol without intravenous contrast. Multiplanar CT image reconstructions of the cervical spine were also generated. COMPARISON:  CT and MRI exams of the brain from 02/03/2009 FINDINGS: CT HEAD  FINDINGS Brain: Atrophy with chronic moderate small vessel ischemia. No large vascular territory infarct. No midline shift or edema. No intra-axial mass nor extra-axial fluid collections. Midline fourth ventricle and basal cisterns without effacement. Vascular: Atherosclerosis of the cavernous carotid arteries bilaterally. No hyperdense vessel sign. Skull: No skull fracture or suspicious osseous lesions. Sinuses/Orbits: Bilateral cataract extractions. Mild ethmoid sinus mucosal thickening. No acute sinus disease. No mastoid effusion. Other: None CT CERVICAL SPINE FINDINGS Alignment: Maintained cervical lordosis. Intact craniocervical relationship and atlantodental interval. No jumped or perched facets. Skull base and vertebrae: Intact skull base.  No vertebral fracture. Soft tissues and spinal canal: No prevertebral soft tissue swelling. No visible canal hematoma. Disc levels: Moderate to marked disc space narrowing at C5-6 and C6-7. Uncovertebral joint osteoarthritis noted at C5-6 and C6-7 bilaterally contributing to mild left C5-6 and mild right C6-7 neural foraminal encroachment. Multilevel degenerative facet arthropathy is visualized most prominent with subcortical cystic change at C3-4 on the left. Upper chest: Negative Other: None IMPRESSION: 1. Atrophy with chronic moderate small vessel ischemia. No acute intracranial abnormality. 2. No acute cervical spine fracture or listhesis. 3. Degenerative disc disease C5-6 and C6-7 with uncovertebral joint osteoarthritis and uncinate spurring contributing to mild left C5-6 and right C6-7 neural foraminal encroachment. Multilevel degenerative facet arthropathy noted. Electronically Signed   By: Ashley Royalty M.D.   On: 05/13/2017 01:40   US Renal  Result Date: 05/14/2017 CLINICAL DATA:  Acute kidney failure EXAM: RENAL / URINARY TRACT ULTRASOUND COMPLETE COMPARISON:  None. FINDINGS: Right Kidney: Length: 12.2 cm. Increased cortical echogenicity. No hydronephrosis. Left  Kidney: Length: 11.4 cm. Increased cortical echogenicity. No hydronephrosis. Small exophytic cyst measuring 1.8 cm. Bladder: Small echogenic debris within the bladder. IMPRESSION: 1. Increased cortical echogenicity consistent with medical renal disease. No hydronephrosis 2. Small amount of echogenic debris within the bladder 3. Small cyst in the left kidney Electronically Signed   By: Donavan Foil M.D.   On: 05/14/2017 18:37   Mr Tibia Fibula Left Wo Contrast  Result Date: 05/23/2017 CLINICAL DATA:  84 year old female with diabetes and hypertension. History of basal cell carcinoma of the breast in 1998. Patient is on chronic oral antibiotics for left foot osteomyelitis. EXAM: MRI OF LOWER LEFT EXTREMITY WITHOUT  CONTRAST TECHNIQUE: Multiplanar, multisequence MR imaging of the left leg was performed. No intravenous contrast was administered. COMPARISON:  None. FINDINGS: Bones/Joint/Cartilage Osteoarthritis of the patellofemoral and femorotibial compartments of the included knees. The left ankle is externally rotated on the frontal projection which may account for the subluxed appearance on the sagittal projection. No marrow signal abnormality, fracture nor frank bone destruction. No evidence of acute osteomyelitis of both legs. Ligaments Noncontributory Muscles and Tendons Intramuscular edema consistent with diffuse myositis of the gastrocnemius, soleus and anterior tibialis muscles on the left. Soft tissues Left-greater-than-right subcutaneous soft tissue edema either representing cellulitis or possibly third spacing of fluid. IMPRESSION: 1. Subcutaneous soft tissue edema of both legs left greater than right with intramuscular edema of the left gastrocnemius, soleus and tibialis anterior muscles consistent with a myositis. No evidence of pyomyositis. 2. No marrow signal abnormality to suggest acute osteo osteomyelitis nor fracture. No suspicious osseous lesions. 3. Tricompartmental osteoarthritis of the knees.  Electronically Signed   By: Ashley Royalty M.D.   On: 05/23/2017 22:29   Dg Chest Port 1 View  Result Date: 05/16/2017 CLINICAL DATA:  CHF, shortness of breath. EXAM: PORTABLE CHEST 1 VIEW COMPARISON:  Chest x-rays dated 05/12/2017 and 06/29/2006. FINDINGS: Cardiomegaly appears stable compared to the most recent chest x-ray of 05/12/2017. Overall cardiomediastinal silhouette appears stable. Atherosclerotic changes again noted at the aortic arch. Platelike opacity overlying the RIGHT hilum, atelectasis versus pneumonia. Lungs otherwise clear. No pleural effusion or pneumothorax seen. IMPRESSION: 1. RIGHT perihilar atelectasis versus pneumonia. Recommend follow-up chest x-ray to ensure resolution. 2. Stable cardiomegaly. 3. Aortic atherosclerosis. Electronically Signed   By: Franki Cabot M.D.   On: 05/16/2017 08:59   Dg Humerus Right  Result Date: 05/27/2017 CLINICAL DATA:  Right arm pain, no known injury EXAM: RIGHT HUMERUS - 2+ VIEW COMPARISON:  None. FINDINGS: Degenerative changes of the glenohumeral articulation are seen. No acute fracture or dislocation is noted. No soft tissue abnormality is seen. IMPRESSION: No acute abnormality noted. Electronically Signed   By: Inez Catalina M.D.   On: 05/27/2017 15:07    Microbiology: No results found for this or any previous visit (from the past 240 hour(s)).   Labs: Basic Metabolic Panel: Recent Labs  Lab 05/25/17 0222 05/26/17 0312 05/27/17 0247 05/28/17 0342 05/29/17 0349  NA 135 136 136 132* 135  K 4.8 4.5 4.5 3.9 4.0  CL 103 101 103 100* 103  CO2 17* 19* 19* 19* 20*  GLUCOSE 104* 106* 118* 108* 119*  BUN 119* 120* 117* 102* 105*  CREATININE 5.84* 5.34* 4.86* 4.17* 3.76*  CALCIUM 8.5* 8.6* 8.6* 8.5* 8.6*  MG  --   --   --   --  1.2*  PHOS 8.1* 7.6* 6.8* 5.5* 5.5*   Liver Function Tests: Recent Labs  Lab 05/25/17 0222 05/26/17 0312 05/27/17 0247 05/28/17 0342 05/29/17 0349  ALBUMIN 2.1* 2.2* 2.2* 2.2* 2.2*   No results for  input(s): LIPASE, AMYLASE in the last 168 hours. No results for input(s): AMMONIA in the last 168 hours. CBC: Recent Labs  Lab 05/24/17 0312 05/25/17 0222 05/26/17 0312 05/28/17 0342  WBC 7.7 6.7 8.0 7.7  HGB 10.4* 9.5* 9.9* 9.7*  HCT 30.4* 28.5* 28.8* 29.9*  MCV 83.5 83.6 83.5 84.7  PLT 352 298 301 288   Cardiac Enzymes: No results for input(s): CKTOTAL, CKMB, CKMBINDEX, TROPONINI in the last 168 hours. BNP: BNP (last 3 results) Recent Labs    05/14/17 0203  BNP 219.0*  ProBNP (last 3 results) No results for input(s): PROBNP in the last 8760 hours.  CBG: Recent Labs  Lab 05/28/17 0610 05/28/17 1203 05/28/17 1714 05/28/17 2141 05/29/17 0632  GLUCAP 110* 155* 118* 150* 126*       Signed:  Irine Seal MD.  [facility] 05/29/2017, 11:04 AM

## 2017-05-29 NOTE — Care Management Note (Signed)
Case Management Note Marvetta Gibbons RN, BSN Unit 4E-Case Manager [phone]  Patient Details  Name: Chelsea [name] MRN: [id number] Date of Birth: 11-12-1928  Subjective/Objective:   Pt admitted with NSTEMI,  Vol. Overload,  Progressive kidney failure.                 Action/Plan: PTA pt lived at home, per PT eval recommendation for SNF- CSW following for placement needs when pt medically stable for transition to SNF.   Expected Discharge Date:  05/29/17               Expected Discharge Plan:  [place]  In-House Referral:  Clinical Social Work  Discharge planning Services  CM Consult  Post Acute Care Choice:  Durable Medical Equipment Choice offered to:  NA  DME Arranged:  3-N-1, Tub bench DME Agency:  NA  HH Arranged:  NA HH Agency:  NA  Status of Service:  Completed, signed off  If discussed at [place] of Stay Meetings, dates discussed:  4/23  Discharge Disposition: skilled facility   Additional Comments:  05/29/17- 1120- Shayli Altemose RN, CM- pt for d/c to SNF today- DME ordered 3n1/tub bench- however this will need to be f/u at the SNF prior to discharge home. CSW will follow for transition to [facility] SNF today.   Dawayne Patricia, RN 05/29/2017, 11:24 AM

## 2017-05-29 NOTE — Clinical Social Work Placement (Signed)
   CLINICAL SOCIAL WORK PLACEMENT  NOTE 05/29/17 - DISCHARGED TO [facility] VIA AMBULANCE  Date:  05/29/2017  Patient Details  Name: Chelsea [name] MRN: [id number] Date of Birth: 09-24-1928  Clinical Social Work is seeking post-discharge placement for this patient at the [place] level of care (*CSW will initial, date and re-position this form in  chart as items are completed):  Yes   Patient/family provided with [facility] Work Department's list of facilities offering this level of care within the geographic area requested by the patient (or if unable, by the patient's family).  Yes   Patient/family informed of their freedom to choose among providers that offer the needed level of care, that participate in Medicare, Medicaid or managed care program needed by the patient, have an available bed and are willing to accept the patient.  Yes   Patient/family informed of [facility]'s ownership interest in [facility] and [facility], as well as of the fact that they are under no obligation to receive care at these facilities.  PASRR submitted to EDS on 05/22/17     PASRR number received on 05/22/17     Existing PASRR number confirmed on       FL2 transmitted to all facilities in geographic area requested by pt/family on 05/22/17     FL2 transmitted to all facilities within larger geographic area on       Patient informed that his/her managed care company has contracts with or will negotiate with certain facilities, including the following:        Yes   Patient/family informed of bed offers received.  Patient chooses bed at [place] [facility] recommends and patient chooses bed at      Patient to be transferred to [facility] on 05/29/17.  Patient to be transferred to facility by Ambulance     Patient family notified on 05/29/17 of transfer.  Name of family member notified:  Chelsea [name]       PHYSICIAN       Additional Comment:    _______________________________________________ Sable Feil, LCSW 05/29/2017, 3:34 PM

## 2017-05-29 NOTE — Telephone Encounter (Signed)
Per Clinical Social work note on 4/22 the pt will be discharged today to [facility].  No TCM call needed.

## 2017-05-29 NOTE — Progress Notes (Addendum)
[facility] NEPHROLOGY PROGRESS NOTE  Assessment/ Plan: Pt is a 84 y.o. yo female with history of a stroke, hypertension, diabetes, COPD, ITP chronic osteomyelitis of left foot on chronic p.o. antibiotics presented to the ER with NSTEMI and AKI.  Assessment/Plan:  #Acute kidney injury likely ATN in the setting of hypotension as well as exposure to vancomycin.  Per EMS SBP was 70s.  The creatinine level elevated and peaked at 7.33.  Patient is nonoliguric and serum creatinine level continues to improve, creatinine level of 3.7 today.  She has no edema on physical exam.  Holding further diuretics until follow up visit.  Appointment made to follow-up with Dr. Hollie Salk on 05/3017 at 2 PM, check in at 1:30 PM at [facility].    #Acute coronary syndrome/NSTEMI: No catheter due to poor renal function.  No chest pain.  On Imdur and Coreg.  #Left foot osteomyelitis: On Augmentin and doxycycline.  #Hypertension: Blood pressure acceptable.  Continue to monitor.  #Acute CHF: improved, follow-up with cardiology.  Patient is a stable to discharge from nephrology standpoint.  She will follow-up in a week.   Subjective:  Seen and examined at bedside.  Denies headache, dizziness, nausea vomiting chest pain shortness of breath. Objective Vital signs in last 24 hours: Vitals:   05/28/17 1156 05/28/17 2034 05/29/17 0508 05/29/17 1017  BP: 106/68   127/64  Pulse: 79 74  79  Resp:  (!) 24    Temp:  97.9 F (36.6 C)    TempSrc:  Oral    SpO2: 100% 96%    Weight:   79 kg (174 lb 2.6 oz)   Height:       Weight change: -0.8 kg (-1 lb 12.2 oz)  Intake/Output Summary (Last 24 hours) at 05/29/2017 1155 Last data filed at 05/29/2017 0700 Gross per 24 hour  Intake 120 ml  Output 600 ml  Net -480 ml       Labs: Basic Metabolic Panel: Recent Labs  Lab 05/27/17 0247 05/28/17 0342 05/29/17 0349  NA 136 132* 135  K 4.5 3.9 4.0  CL 103 100* 103  CO2 19* 19* 20*  GLUCOSE 118* 108* 119*  BUN  117* 102* 105*  CREATININE 4.86* 4.17* 3.76*  CALCIUM 8.6* 8.5* 8.6*  PHOS 6.8* 5.5* 5.5*   Liver Function Tests: Recent Labs  Lab 05/27/17 0247 05/28/17 0342 05/29/17 0349  ALBUMIN 2.2* 2.2* 2.2*   No results for input(s): LIPASE, AMYLASE in the last 168 hours. No results for input(s): AMMONIA in the last 168 hours. CBC: Recent Labs  Lab 05/24/17 0312 05/25/17 0222 05/26/17 0312 05/28/17 0342  WBC 7.7 6.7 8.0 7.7  HGB 10.4* 9.5* 9.9* 9.7*  HCT 30.4* 28.5* 28.8* 29.9*  MCV 83.5 83.6 83.5 84.7  PLT 352 298 301 288   Cardiac Enzymes: No results for input(s): CKTOTAL, CKMB, CKMBINDEX, TROPONINI in the last 168 hours. CBG: Recent Labs  Lab 05/28/17 1203 05/28/17 1714 05/28/17 2141 05/29/17 0632 05/29/17 1109  GLUCAP 155* 118* 150* 126* 201*    Iron Studies: No results for input(s): IRON, TIBC, TRANSFERRIN, FERRITIN in the last 72 hours. Studies/Results: Dg Shoulder Right  Result Date: 05/27/2017 CLINICAL DATA:  Shoulder pain, no known injury, subsequent encounter EXAM: RIGHT SHOULDER - 2+ VIEW COMPARISON:  05/12/2017 FINDINGS: Degenerative changes of the acromioclavicular joint are seen. Glenohumeral degenerative changes are noted as well. No acute fracture or dislocation is noted. The underlying bony thorax is within normal limits. IMPRESSION: No acute abnormality noted. Electronically  Signed   By: Inez Catalina M.D.   On: 05/27/2017 15:06   Dg Forearm Right  Result Date: 05/27/2017 CLINICAL DATA:  Forearm pain, initial encounter EXAM: RIGHT FOREARM - 2 VIEW COMPARISON:  None. FINDINGS: There is no evidence of fracture or other focal bone lesions. Soft tissues are unremarkable. IMPRESSION: No acute abnormality noted. Electronically Signed   By: Inez Catalina M.D.   On: 05/27/2017 15:07   Dg Humerus Right  Result Date: 05/27/2017 CLINICAL DATA:  Right arm pain, no known injury EXAM: RIGHT HUMERUS - 2+ VIEW COMPARISON:  None. FINDINGS: Degenerative changes of the  glenohumeral articulation are seen. No acute fracture or dislocation is noted. No soft tissue abnormality is seen. IMPRESSION: No acute abnormality noted. Electronically Signed   By: Inez Catalina M.D.   On: 05/27/2017 15:07    Medications: Infusions: . magnesium sulfate 1 - 4 g bolus IVPB 4 g (05/29/17 1010)    Scheduled Medications: . acetaminophen  500 mg Oral TID  . amoxicillin-clavulanate  500 mg Oral Daily  . aspirin  81 mg Oral QPC breakfast  . carvedilol  3.125 mg Oral BID WC  . cholecalciferol  2,000 Units Oral Daily  . doxycycline  100 mg Oral BID  . heparin injection (subcutaneous)  5,000 Units Subcutaneous Q8H  . insulin aspart  0-5 Units Subcutaneous QHS  . insulin aspart  0-9 Units Subcutaneous TID WC  . isosorbide mononitrate  15 mg Oral Daily  . magnesium oxide  400 mg Oral BID  . omega-3 acid ethyl esters  1,000 mg Oral Daily  . pantoprazole  40 mg Oral QPC breakfast  . senna-docusate  1 tablet Oral BID    have reviewed scheduled and prn medications.  Physical Exam: General: Not in distress Heart: Regular rate rhythm S1-S2 normal.  No rubs Lungs: Lungs clear, no wheezing Abdomen: Soft, nontender.  Bowel sounds positive Extremities: no edema   Dron Prasad Bhandari 05/29/2017,11:55 AM  LOS: 16 days

## 2017-05-29 NOTE — Care Management Important Message (Signed)
Important Message  Patient Details  Name: Chelsea [name] MRN: [id number] Date of Birth: 1928-11-13   Medicare Important Message Given:  Yes    Kaori Jumper P [place] 05/29/2017, 2:10 PM

## 2017-05-30 ENCOUNTER — Non-Acute Institutional Stay (SKILLED_NURSING_FACILITY): Payer: Medicare Other | Admitting: Adult Health

## 2017-05-30 ENCOUNTER — Encounter: Payer: Self-pay | Admitting: Adult Health

## 2017-05-30 DIAGNOSIS — N170 Acute kidney failure with tubular necrosis: Secondary | ICD-10-CM

## 2017-05-30 DIAGNOSIS — I10 Essential (primary) hypertension: Secondary | ICD-10-CM | POA: Diagnosis not present

## 2017-05-30 DIAGNOSIS — I214 Non-ST elevation (NSTEMI) myocardial infarction: Secondary | ICD-10-CM | POA: Diagnosis not present

## 2017-05-30 DIAGNOSIS — Z794 Long term (current) use of insulin: Secondary | ICD-10-CM | POA: Diagnosis not present

## 2017-05-30 DIAGNOSIS — E1152 Type 2 diabetes mellitus with diabetic peripheral angiopathy with gangrene: Secondary | ICD-10-CM

## 2017-05-30 DIAGNOSIS — R531 Weakness: Secondary | ICD-10-CM | POA: Diagnosis not present

## 2017-05-30 DIAGNOSIS — M25511 Pain in right shoulder: Secondary | ICD-10-CM | POA: Diagnosis not present

## 2017-05-30 DIAGNOSIS — I5043 Acute on chronic combined systolic (congestive) and diastolic (congestive) heart failure: Secondary | ICD-10-CM | POA: Diagnosis not present

## 2017-05-30 DIAGNOSIS — M86672 Other chronic osteomyelitis, left ankle and foot: Secondary | ICD-10-CM | POA: Diagnosis not present

## 2017-05-30 NOTE — Progress Notes (Signed)
Location:  [facility] Room Number: 924-Q Place of Service:  SNF (31) Provider:  Durenda Age, NP  Patient Care Team: Jani Gravel, MD as PCP - General (Internal Medicine) Nahser, Wonda Cheng, MD as PCP - Cardiology (Cardiology) Thressa Sheller, MD as Consulting Physician (Internal Medicine) Fanny Skates, MD as Consulting Physician (General Surgery) Truitt Merle, MD as Consulting Physician (Hematology) Thea Silversmith, MD (Inactive) as Consulting Physician (Radiation Oncology) Mauro Kaufmann, RN as Registered Nurse Rockwell Germany, RN as Registered Nurse Jake Shark, Johny Blamer, NP as Nurse Practitioner (Nurse Practitioner)  Extended Emergency Contact Information Primary Emergency Contact: Santiago Bur Address: [address]          [place], [place] Phone: [phone] Work Phone: [phone] Relation: Daughter Secondary Emergency Contact: [place] Mobile Phone: [phone] Relation: Son  Code Status:  DNR  Goals of care: Advanced Directive information Advanced Directives 05/30/2017  Does Patient Have a Medical Advance Directive? Yes  Type of Advance Directive Out of facility DNR (pink MOST or yellow form)  Does patient want to make changes to medical advance directive? No - Patient declined  Copy of [place] in Chart? -  Would patient like information on creating a medical advance directive? No - Patient declined     Chief Complaint  Patient presents with  . Acute Visit    Hospital followup, status post hospitalization at [facility] 4/6-4/23/19 for NSTEMI    HPI:  Pt is an 84 y.o. female seen today for hospital follow-up.  She was admitted to [facility] on 05/29/17 for short-term rehabilitation following an admission at [facility] 4/6-4/23/19 for NSTEMI.  She has a PMH of diabetes, chronic osteomyelitis of left foot, hypertension, acute  kidney failure, and acute on chronic combined systolic and diastolic CHF. She had right shoulder pain and unable to get out of bed at home so she called EMS. She denied having falls. Her right shoulder has been painful for a week and left shoulder started to hurt for 2 days. In the ED, troponins were elevated at 3.5 and EKG showed inferior ST depression. Cardiology was consulted and due to progressive kidney failure unable to undergo invasive procedures. She was started on Coreg, aspirin, Lovaza and Imdur.  Nitro patch was discontinued. ACE inhibitor was discontinued due to worsening renal function. Chest pain has been resolved. Nephrology was consulted and managed diuretics for acute on systolic heart failure secondary to acute MI. She was -18.3 L during hospitalization period.  ARB was discontinued. Acute renal failure was thought to be due to ATN versus pigment induced nephropathy as patient was noted to have presented with relative hypotension, was on IV Vancomycin and Zosyn. Nephrotoxins were discontinued and was placed on intermittent Lasix per nephrology. Her urine output improved and creatinine slowly trended down from 7.11 to 3.76. She has chronic ankle/foot osteomyelitis and MRI done on 05/23/17 showed myositis of left gastrocnemius, soleus, tibialis anterior muscles, and negative for acute osteomyelitis. She was given IV antibiotics and was changed back to home oral antibiotics, Augmentin and Doxycycline. She was seen in her room today. She was complaining of pain on her left foot and was asking for pain medication. She was noted to have left foot AFO boot. No edema noted. Speech Therapist evaluated her cognition and SLUMS score was 28/30, normal.   Past Medical History:  Diagnosis Date  . Anemia    takes Ferrous Sulfate daily  . Arthritis   .  Basal cell carcinoma of breast 1998  . Bruises easily   . Dizziness   . DM (diabetes mellitus) ([place])    takes Metformin daily  . Eczema   . Emphysema  lung ([place])   . GERD (gastroesophageal reflux disease)    takes Protonix daily  . Glaucoma    Bil  . History of hiatal hernia   . HTN (hypertension)    takes Diovan daily  . Hyperlipidemia   . ITP (idiopathic thrombocytopenic purpura) 2/09  . Joint pain   . Joint swelling   . Nocturia   . Osteoporosis   . Peripheral edema   . Stroke ([place]) 02/03/09   mid brain, diplopia   Past Surgical History:  Procedure Laterality Date  . BREAST LUMPECTOMY WITH RADIOACTIVE SEED LOCALIZATION Right 10/23/2014   Procedure: RIGHT BREAST LUMPECTOMY WITH RADIOACTIVE SEED LOCALIZATION;  Surgeon: Fanny Skates, MD;  Location: [facility];  Service: General;  Laterality: Right;  . CATARACT EXTRACTION Bilateral   . COLONOSCOPY    . ESOPHAGOGASTRODUODENOSCOPY    . MYOMECTOMY N/A 03/14/1995   late 90's per pt  . PARATHYROIDECTOMY  2008  . REFRACTIVE SURGERY Bilateral   . TONSILLECTOMY  as a child    Allergies  Allergen Reactions  . Travatan Z [Travoprost (Bak Free)] Other (See Comments)    dizziness    Outpatient Encounter Medications as of 05/30/2017  Medication Sig  . acetaminophen (TYLENOL) 500 MG tablet Take 1 tablet (500 mg total) by mouth 3 (three) times daily for 7 days.  Marland Kitchen amoxicillin-clavulanate (AUGMENTIN) 875-125 MG tablet Take 1 tablet by mouth 2 (two) times daily.  Marland Kitchen aspirin 81 MG tablet Take 81 mg by mouth daily after breakfast.   . carvedilol (COREG) 3.125 MG tablet Take 1 tablet (3.125 mg total) by mouth 2 (two) times daily with a meal.  . Cholecalciferol (VITAMIN D3) 2000 UNITS TABS Take 2,000 Units by mouth daily.  Marland Kitchen doxycycline (MONODOX) 100 MG capsule Take 1 capsule (100 mg total) by mouth 2 (two) times daily.  Marland Kitchen HYDROcodone-acetaminophen (NORCO/VICODIN) 5-325 MG tablet Take 1 tablet by mouth every 6 (six) hours as needed for moderate pain.   Derrill Memo ON 06/03/2017] HYDROcodone-acetaminophen (NORCO/VICODIN) 5-325 MG tablet Take 1 tablet by mouth every 8 (eight) hours as needed for  moderate pain.  . hydrOXYzine (ATARAX/VISTARIL) 10 MG tablet Take 1 tablet (10 mg total) by mouth every 6 (six) hours as needed for itching.  . insulin glargine (LANTUS) 100 UNIT/ML injection Inject 10 Units into the skin at bedtime.  . isosorbide mononitrate (IMDUR) 30 MG 24 hr tablet Take 0.5 tablets (15 mg total) by mouth daily.  . magnesium oxide (MAG-OX) 400 (241.3 Mg) MG tablet Take 1 tablet (400 mg total) by mouth 2 (two) times daily.  . nitroGLYCERIN (NITROSTAT) 0.4 MG SL tablet Place 1 tablet (0.4 mg total) under the tongue every 5 (five) minutes x 3 doses as needed for chest pain.  . Omega 3 1200 MG CAPS Take 1,200 mg by mouth 2 (two) times daily.   . pantoprazole (PROTONIX) 40 MG tablet Take 40 mg by mouth daily after breakfast.   . senna-docusate (SENOKOT-S) 8.6-50 MG tablet Take 1 tablet by mouth at bedtime.  . [DISCONTINUED] HYDROcodone-acetaminophen (NORCO/VICODIN) 5-325 MG tablet Take 1-2 tablets by mouth every 6 (six) hours as needed for up to 5 days for moderate pain or severe pain.  . [DISCONTINUED] insulin aspart (NOVOLOG) 100 UNIT/ML injection Inject 0-5 Units into the skin at bedtime.  . [  DISCONTINUED] insulin aspart (NOVOLOG) 100 UNIT/ML injection Inject 0-9 Units into the skin 3 (three) times daily with meals.   No facility-administered encounter medications on file as of 05/30/2017.     Review of Systems  GENERAL: No change in appetite, no fatigue, no weight changes, no fever, chills or weakness MOUTH and THROAT: Denies oral discomfort, gingival pain or bleeding, pain from teeth or hoarseness   RESPIRATORY: no cough, SOB, DOE, wheezing, hemoptysis CARDIAC: No chest pain, edema or palpitations GI: No abdominal pain, diarrhea, constipation, heart burn, nausea or vomiting GU: Denies dysuria, frequency, hematuria, incontinence, or discharge PSYCHIATRIC: Denies feelings of depression or anxiety. No report of hallucinations, insomnia, paranoia, or agitation   Immunization  History  Administered Date(s) Administered  . Influenza Split 01/14/2011  . Influenza Whole 01/27/2010  . Influenza-Unspecified 01/09/2011, 11/06/2016  . Pneumococcal Polysaccharide-23 07/04/2007   Pertinent  Health Maintenance Due  Topic Date Due  . FOOT EXAM  09/02/1938  . OPHTHALMOLOGY EXAM  09/02/1938  . URINE MICROALBUMIN  09/02/1938  . PNA vac Low Risk Adult (2 of 2 - PCV13) 07/03/2008  . INFLUENZA VACCINE  09/06/2017  . HEMOGLOBIN A1C  10/18/2017  . DEXA SCAN  Completed   Fall Risk  05/10/2017 04/19/2017  Falls in the past year? Yes No  Number falls in past yr: 1 -  Injury with Fall? No -  Comment "fall in hallway at home" -  Risk for fall due to : Impaired balance/gait -      Vitals:   05/30/17 0922  BP: 120/69  Pulse: 74  Resp: 20  Temp: 97.9 F (36.6 C)  TempSrc: Oral  SpO2: 98%  Weight: 174 lb 2.6 oz (79 kg)  Height: 5\' 4"  (1.626 m)   Body mass index is 29.89 kg/m.  Physical Exam  GENERAL APPEARANCE: Well nourished. In no acute distress. Normal body habitus SKIN:  Has a black/necrotic tissue at bottom of left 5th toe MOUTH and THROAT: Lips are without lesions. Oral mucosa is moist and without lesions. Tongue is normal in shape, size, and color and without lesions RESPIRATORY: Breathing is even & unlabored, BS CTAB CARDIAC: RRR, no murmur,no extra heart sounds, no edema GI: Abdomen soft, normal BS, no masses, no tenderness EXTREMITIES:  Able to move X 4 extremities, left ankle is deformed, has AFO boot PSYCHIATRIC: Alert and oriented X 3. Affect and behavior are appropriate   Labs reviewed: Recent Labs    05/17/17 0313 05/18/17 0419  05/27/17 0247 05/28/17 0342 05/29/17 0349  NA 133* 132*   < > 136 132* 135  K 5.2* 5.6*   < > 4.5 3.9 4.0  CL 104 103   < > 103 100* 103  CO2 18* 18*   < > 19* 19* 20*  GLUCOSE 148* 136*   < > 118* 108* 119*  BUN 57* 74*   < > 117* 102* 105*  CREATININE 5.78* 6.29*   < > 4.86* 4.17* 3.76*  CALCIUM 7.8* 8.3*   < >  8.6* 8.5* 8.6*  MG 1.8 1.7  --   --   --  1.2*  PHOS  --  6.8*   < > 6.8* 5.5* 5.5*   < > = values in this interval not displayed.   Recent Labs    04/17/17 1146 05/17/17 0313  05/21/17 0317  05/27/17 0247 05/28/17 0342 05/29/17 0349  AST 21 144*  --  25  --   --   --   --  ALT 14 127*  --  60*  --   --   --   --   ALKPHOS  --  73  --  56  --   --   --   --   BILITOT 0.5 0.6  --  0.6  --   --   --   --   PROT 6.5 5.0*  --  5.1*  --   --   --   --   ALBUMIN  --  1.9*   < > 1.8*   < > 2.2* 2.2* 2.2*   < > = values in this interval not displayed.   Recent Labs    04/17/17 1146 05/12/17 2326  05/13/17 0944  05/25/17 0222 05/26/17 0312 05/28/17 0342  WBC 4.5 9.6  --  11.9*   < > 6.7 8.0 7.7  NEUTROABS 2,723 8.1*  --  9.2*  --   --   --   --   HGB 13.3 16.3*   < > 12.1   < > 9.5* 9.9* 9.7*  HCT 39.5 49.5*   < > 37.5   < > 28.5* 28.8* 29.9*  MCV 83.3 85.3  --  84.7   < > 83.6 83.5 84.7  PLT 148 214  --  234   < > 298 301 288   < > = values in this interval not displayed.   Lab Results  Component Value Date   TSH 2.057 Test methodology is 3rd generation TSH 02/04/2009   Lab Results  Component Value Date   HGBA1C 6.8 (H) 04/17/2017   Lab Results  Component Value Date   CHOL 113 05/13/2017   HDL 52 05/13/2017   LDLCALC 55 05/13/2017   TRIG 28 05/13/2017   CHOLHDL 2.2 05/13/2017    Significant Diagnostic Results in last 30 days:  Dg Chest 2 View  Result Date: 05/13/2017 CLINICAL DATA:  84 y/o  F; right shoulder pain.  Hypotension. EXAM: CHEST - 2 VIEW COMPARISON:  01/09/2008 chest CT. FINDINGS: Stable enlarged cardiac silhouette given projection and technique. Calcific aortic atherosclerosis. Large hiatal hernia. Pulmonary vascular congestion. No pleural effusion or pneumothorax. Bones are unremarkable. IMPRESSION: Large hiatal hernia. Pulmonary vascular congestion. Mild cardiomegaly. Aortic atherosclerosis. Electronically Signed   By: Kristine Garbe M.D.   On:  05/13/2017 00:13   Dg Shoulder Right  Result Date: 05/27/2017 CLINICAL DATA:  Shoulder pain, no known injury, subsequent encounter EXAM: RIGHT SHOULDER - 2+ VIEW COMPARISON:  05/12/2017 FINDINGS: Degenerative changes of the acromioclavicular joint are seen. Glenohumeral degenerative changes are noted as well. No acute fracture or dislocation is noted. The underlying bony thorax is within normal limits. IMPRESSION: No acute abnormality noted. Electronically Signed   By: Inez Catalina M.D.   On: 05/27/2017 15:06   Dg Shoulder Right  Result Date: 05/13/2017 CLINICAL DATA:  84 y/o  F; 2 weeks of right shoulder pain. EXAM: RIGHT SHOULDER - 2+ VIEW COMPARISON:  None. FINDINGS: There is no evidence of fracture or dislocation. Mild osteoarthrosis of the glenohumeral joint with small osteophytes at the inferior margin of humeral head. Soft tissues are unremarkable. IMPRESSION: 1.  No acute fracture or dislocation identified. 2. Mild osteoarthrosis of the glenohumeral joint. Electronically Signed   By: Kristine Garbe M.D.   On: 05/13/2017 00:16   Dg Forearm Right  Result Date: 05/27/2017 CLINICAL DATA:  Forearm pain, initial encounter EXAM: RIGHT FOREARM - 2 VIEW COMPARISON:  None. FINDINGS: There is no evidence of fracture or other  focal bone lesions. Soft tissues are unremarkable. IMPRESSION: No acute abnormality noted. Electronically Signed   By: Inez Catalina M.D.   On: 05/27/2017 15:07   Ct Head Wo Contrast  Result Date: 05/13/2017 CLINICAL DATA:  Hypotensive. History of stroke in 2010. Right shoulder pain. EXAM: CT HEAD WITHOUT CONTRAST CT CERVICAL SPINE WITHOUT CONTRAST TECHNIQUE: Multidetector CT imaging of the head and cervical spine was performed following the standard protocol without intravenous contrast. Multiplanar CT image reconstructions of the cervical spine were also generated. COMPARISON:  CT and MRI exams of the brain from 02/03/2009 FINDINGS: CT HEAD FINDINGS Brain: Atrophy with  chronic moderate small vessel ischemia. No large vascular territory infarct. No midline shift or edema. No intra-axial mass nor extra-axial fluid collections. Midline fourth ventricle and basal cisterns without effacement. Vascular: Atherosclerosis of the cavernous carotid arteries bilaterally. No hyperdense vessel sign. Skull: No skull fracture or suspicious osseous lesions. Sinuses/Orbits: Bilateral cataract extractions. Mild ethmoid sinus mucosal thickening. No acute sinus disease. No mastoid effusion. Other: None CT CERVICAL SPINE FINDINGS Alignment: Maintained cervical lordosis. Intact craniocervical relationship and atlantodental interval. No jumped or perched facets. Skull base and vertebrae: Intact skull base.  No vertebral fracture. Soft tissues and spinal canal: No prevertebral soft tissue swelling. No visible canal hematoma. Disc levels: Moderate to marked disc space narrowing at C5-6 and C6-7. Uncovertebral joint osteoarthritis noted at C5-6 and C6-7 bilaterally contributing to mild left C5-6 and mild right C6-7 neural foraminal encroachment. Multilevel degenerative facet arthropathy is visualized most prominent with subcortical cystic change at C3-4 on the left. Upper chest: Negative Other: None IMPRESSION: 1. Atrophy with chronic moderate small vessel ischemia. No acute intracranial abnormality. 2. No acute cervical spine fracture or listhesis. 3. Degenerative disc disease C5-6 and C6-7 with uncovertebral joint osteoarthritis and uncinate spurring contributing to mild left C5-6 and right C6-7 neural foraminal encroachment. Multilevel degenerative facet arthropathy noted. Electronically Signed   By: Ashley Royalty M.D.   On: 05/13/2017 01:40   Ct Cervical Spine Wo Contrast  Result Date: 05/13/2017 CLINICAL DATA:  Hypotensive. History of stroke in 2010. Right shoulder pain. EXAM: CT HEAD WITHOUT CONTRAST CT CERVICAL SPINE WITHOUT CONTRAST TECHNIQUE: Multidetector CT imaging of the head and cervical spine  was performed following the standard protocol without intravenous contrast. Multiplanar CT image reconstructions of the cervical spine were also generated. COMPARISON:  CT and MRI exams of the brain from 02/03/2009 FINDINGS: CT HEAD FINDINGS Brain: Atrophy with chronic moderate small vessel ischemia. No large vascular territory infarct. No midline shift or edema. No intra-axial mass nor extra-axial fluid collections. Midline fourth ventricle and basal cisterns without effacement. Vascular: Atherosclerosis of the cavernous carotid arteries bilaterally. No hyperdense vessel sign. Skull: No skull fracture or suspicious osseous lesions. Sinuses/Orbits: Bilateral cataract extractions. Mild ethmoid sinus mucosal thickening. No acute sinus disease. No mastoid effusion. Other: None CT CERVICAL SPINE FINDINGS Alignment: Maintained cervical lordosis. Intact craniocervical relationship and atlantodental interval. No jumped or perched facets. Skull base and vertebrae: Intact skull base.  No vertebral fracture. Soft tissues and spinal canal: No prevertebral soft tissue swelling. No visible canal hematoma. Disc levels: Moderate to marked disc space narrowing at C5-6 and C6-7. Uncovertebral joint osteoarthritis noted at C5-6 and C6-7 bilaterally contributing to mild left C5-6 and mild right C6-7 neural foraminal encroachment. Multilevel degenerative facet arthropathy is visualized most prominent with subcortical cystic change at C3-4 on the left. Upper chest: Negative Other: None IMPRESSION: 1. Atrophy with chronic moderate small vessel ischemia. No acute intracranial  abnormality. 2. No acute cervical spine fracture or listhesis. 3. Degenerative disc disease C5-6 and C6-7 with uncovertebral joint osteoarthritis and uncinate spurring contributing to mild left C5-6 and right C6-7 neural foraminal encroachment. Multilevel degenerative facet arthropathy noted. Electronically Signed   By: Ashley Royalty M.D.   On: 05/13/2017 01:40   US  Renal  Result Date: 05/14/2017 CLINICAL DATA:  Acute kidney failure EXAM: RENAL / URINARY TRACT ULTRASOUND COMPLETE COMPARISON:  None. FINDINGS: Right Kidney: Length: 12.2 cm. Increased cortical echogenicity. No hydronephrosis. Left Kidney: Length: 11.4 cm. Increased cortical echogenicity. No hydronephrosis. Small exophytic cyst measuring 1.8 cm. Bladder: Small echogenic debris within the bladder. IMPRESSION: 1. Increased cortical echogenicity consistent with medical renal disease. No hydronephrosis 2. Small amount of echogenic debris within the bladder 3. Small cyst in the left kidney Electronically Signed   By: Donavan Foil M.D.   On: 05/14/2017 18:37   Mr Tibia Fibula Left Wo Contrast  Result Date: 05/23/2017 CLINICAL DATA:  84 year old female with diabetes and hypertension. History of basal cell carcinoma of the breast in 1998. Patient is on chronic oral antibiotics for left foot osteomyelitis. EXAM: MRI OF LOWER LEFT EXTREMITY WITHOUT CONTRAST TECHNIQUE: Multiplanar, multisequence MR imaging of the left leg was performed. No intravenous contrast was administered. COMPARISON:  None. FINDINGS: Bones/Joint/Cartilage Osteoarthritis of the patellofemoral and femorotibial compartments of the included knees. The left ankle is externally rotated on the frontal projection which may account for the subluxed appearance on the sagittal projection. No marrow signal abnormality, fracture nor frank bone destruction. No evidence of acute osteomyelitis of both legs. Ligaments Noncontributory Muscles and Tendons Intramuscular edema consistent with diffuse myositis of the gastrocnemius, soleus and anterior tibialis muscles on the left. Soft tissues Left-greater-than-right subcutaneous soft tissue edema either representing cellulitis or possibly third spacing of fluid. IMPRESSION: 1. Subcutaneous soft tissue edema of both legs left greater than right with intramuscular edema of the left gastrocnemius, soleus and tibialis  anterior muscles consistent with a myositis. No evidence of pyomyositis. 2. No marrow signal abnormality to suggest acute osteo osteomyelitis nor fracture. No suspicious osseous lesions. 3. Tricompartmental osteoarthritis of the knees. Electronically Signed   By: Ashley Royalty M.D.   On: 05/23/2017 22:29   Dg Chest Port 1 View  Result Date: 05/16/2017 CLINICAL DATA:  CHF, shortness of breath. EXAM: PORTABLE CHEST 1 VIEW COMPARISON:  Chest x-rays dated 05/12/2017 and 06/29/2006. FINDINGS: Cardiomegaly appears stable compared to the most recent chest x-ray of 05/12/2017. Overall cardiomediastinal silhouette appears stable. Atherosclerotic changes again noted at the aortic arch. Platelike opacity overlying the RIGHT hilum, atelectasis versus pneumonia. Lungs otherwise clear. No pleural effusion or pneumothorax seen. IMPRESSION: 1. RIGHT perihilar atelectasis versus pneumonia. Recommend follow-up chest x-ray to ensure resolution. 2. Stable cardiomegaly. 3. Aortic atherosclerosis. Electronically Signed   By: Franki Cabot M.D.   On: 05/16/2017 08:59   Dg Humerus Right  Result Date: 05/27/2017 CLINICAL DATA:  Right arm pain, no known injury EXAM: RIGHT HUMERUS - 2+ VIEW COMPARISON:  None. FINDINGS: Degenerative changes of the glenohumeral articulation are seen. No acute fracture or dislocation is noted. No soft tissue abnormality is seen. IMPRESSION: No acute abnormality noted. Electronically Signed   By: Inez Catalina M.D.   On: 05/27/2017 15:07    Assessment/Plan  1. Generalized weakness - for rehabilitation with PT and OT, for therapeutic strengthening exercises, fall precautions   2. NSTEMI (non-ST elevated myocardial infarction) (HCC) - troponins were elevated at 3.5 and EKG showed inferior ST depression.  Cardiology was consulted and due to progressive kidney failure unable to undergo invasive procedures. She was started on Coreg, aspirin, Lovaza and Imdur. She is now chest pain free, continue NTG PRN,  follow-up with cardiology on 06/06/17, with [facility]   3. Acute renal failure with tubular necrosis (HCC) -  thought to be due to ATN versus pigment induced nephropathy as patient was noted to have presented with relative hypotension, was on IV Vancomycin and Zosyn. Nephrotoxins were discontinued and was placed on intermittent Lasix per nephrology. Her urine output improved and creatinine slowly trended down from 7.11 to 3.76; follow-up with Dr. Madelon Lips, nephrology, on 06/05/17 @ 1:30PM   4. Acute on chronic combined systolic and diastolic CHF (congestive heart failure) [facility]) - Nephrology was consulted and managed diuretics for acute on systolic heart failure secondary to acute MI. She was -18.3 L during hospitalization period.  ARB was discontinued; continue Coreg 3.125 mg 1 tab BID   5. Essential hypertension - continue Coreg 3.125 mg 1 tab BID   6. Type 2 diabetes mellitus with diabetic peripheral angiopathy and gangrene, with long-term current use of insulin (HCC) - Metformin was discontinued due to acute renal failure, was started on Lantus 100 units/ml 10 units Q HS Lab Results  Component Value Date   HGBA1C 6.8 (H) 04/17/2017     7. Chronic osteomyelitis - MRI done on 05/23/17 showed myositis of left gastrocnemius, soleus, tibialis anterior  and muscles, and negative for acute osteomyelitis. She was given IV antibiotics and was changed back to home oral antibiotic, Augmentin and Doxycycline, follow-up with ID, Dr. Megan Salon, Infectious Disease   8. Right shoulder pain - x-ray of right shoulder was negative for fractures but consistent with degenerative disease, continue Tylenol 500 mg 1 tab TID x 7 days and Norco PRN     Family/ staff Communication:  Discussed plan of care with resident.  Labs/tests ordered:  BMP on 06/04/17  Goals of care:  Short-term care  Durenda Age, NP [facility] [phone] (Monday-Friday 8:00 a.m. -  5:00 p.m.) [phone] (after hours)

## 2017-05-31 ENCOUNTER — Non-Acute Institutional Stay (SKILLED_NURSING_FACILITY): Payer: Medicare Other | Admitting: Internal Medicine

## 2017-05-31 ENCOUNTER — Encounter: Payer: Self-pay | Admitting: Internal Medicine

## 2017-05-31 DIAGNOSIS — E1152 Type 2 diabetes mellitus with diabetic peripheral angiopathy with gangrene: Secondary | ICD-10-CM

## 2017-05-31 DIAGNOSIS — I214 Non-ST elevation (NSTEMI) myocardial infarction: Secondary | ICD-10-CM | POA: Diagnosis not present

## 2017-05-31 DIAGNOSIS — Z794 Long term (current) use of insulin: Secondary | ICD-10-CM

## 2017-05-31 DIAGNOSIS — M86672 Other chronic osteomyelitis, left ankle and foot: Secondary | ICD-10-CM

## 2017-05-31 DIAGNOSIS — N170 Acute kidney failure with tubular necrosis: Secondary | ICD-10-CM

## 2017-05-31 NOTE — Assessment & Plan Note (Addendum)
05/31/17 no active cardiopulmonary symptoms  Follow-up appointment 06/06/17 with Dr.Bhagat, Cardiology

## 2017-05-31 NOTE — Progress Notes (Signed)
NURSING HOME LOCATION:  [facility] ROOM NUMBER:  [id number]  CODE STATUS:  DNR  PCP:  Jani Gravel, MD  [address] [place] [address]   This is a comprehensive admission note to [facility] performed on this date less than 30 days from date of admission. Included are preadmission medical/surgical history;reconciled medication list; family history; social history and comprehensive review of systems.  Corrections and additions to the records were documented. Comprehensive physical exam was also performed. Additionally a clinical summary was entered for each active diagnosis pertinent to this admission in the Problem List to enhance continuity of care.  HPI:  The patient was hospitalized 4/6-4/23/19 with non-ST elevation myocardial infarction in the context of baseline diabetes, essential hypertension, and chronic heart failure. There is some discrepancy in the history as to falls. She states that she had been falling related to the instability of the left foot and ankle and required a cane to ambulate. Her most recent fall was in January of this year, she did not have a fall related to this admission. She was having right shoulder pain present for a week prior to admission. Additionally subsequent she developed left shoulder pain. Sedimentation rate was 9 on 04/17/17. EMS documented systolic blood pressure of 70 initially. In the ED troponin was 3.5 and EKG showed subtle inferior ST changes. The patient does have a history of osteomyelitis and was given Zosyn & Vanc for a foot ulcer. 2 D echo was consistent with wall motion abnormalities in the area of the LAD. Cardiac catheterization could not  be performed due to progressive renal insufficiency. The patient was on metformin prior to admission.  The patient was actually pain-free after initial presentation. Nitropatch was discontinued because of hypotension. Coreg, aspirin, and [place] were initiated. Because of the  worsening renal function nephrology managed diuretics during hospitalization with Lasix. The patient was -18.3 L during hospitalization. ARB was discontinued. The acute renal failure was secondary to ATN versus " pigment induced nephropathy". Renal function slowly improved from a creatinine of 7.11. At discharge creatinine was 3.76. Despite the chronic ankle/foot osteomyelitis, the patient remained afebrile during hospitalization. MRI 4/17 revealed myositis of the left gastrocnemius, soleus, tibialis muscles. MRI was negative for acute osteomyelitis. Dr. Megan Salon, ID manages the IV antibiotics and transitioned her back to her oral antibiotic regimen of Augmentin and doxycycline. Hemoglobin A1c was 6.8%.Metformin was held during the hospitalization. Sliding scale insulin was instituted. She was discharged on a sliding scale. Blood pressure was well controlled on the Coreg and Imdur. She did have altered mental status which did improve during the hospitalization. CT revealed no acute changes. It did show chronic moderate small vessel disease with atrophy. Senile COPD (never smoker) was stable throughout hospitalization. She received nebulized therapies as needed. The right shoulder pain was attributed to osteoarthritis. Imaging showed no acute abnormalities;degenerative disease noted. Tylenol 500 mg 3 times a day as maintenance X 7 days was initiated along with warm compresses. Labs at discharge revealed BUN 105, creatinine 3.76, magnesium 1.2, albumin 2.2, and GFR of 10. Normochromic, normocytic anemia is present with hemoglobin 9.7 and hematocrit 29.9. Patient was discharged to the SNF for rehabilitation. The patient was to follow-up with her cardiologist, Dr Curly Shores, on 06/06/17 for the non-STEMI, acute on chronic combined systolic and diastolic heart failure. She was also to see her nephrologist 06/05/17, Dr. Hollie Salk, with renal panel and magnesium. Nephrology will determine appropriate diuretic therapy. As  SSI is problematic in the SNF,  she was switched to a basal insulin at low dose. Since admission her fasting glucoses have ranged from 106-156. SLUMS testing at the SNF revealed a score of 28 out of 30 which mitigates against dementia. With CT of the head findings and history of stroke she may have some baseline component of vascular neurocognitive impairment  Past medical and surgical history: Includes history of stroke, history of idiopathic thrombocytopenia, dyslipidemia, glaucoma, GERD, and basal cell carcinoma of the breast. Significant surgeries include parathyroidectomy, myomectomy, and breast lumpectomy with radioactive seed implantation.  Social history: Nondrinker; never smoked.retired Environmental education officer.  Family history:reviewed   Review of systems:   Her first concern was that she had to ask for her opioid pain medication. I explained the risks of opiods to her and her daughter, [name]. She states that she is "weak and exhausted" Despite the non-STEMI, she has no active cardio pulmonary symptoms. She states the pain in her shoulders is improved. She did not check glucoses at home, she was on metformin only. She did have urinary frequency but no other diabetic associated symptoms/ signs except for the left foot lesion. She does have chronic constipation despite being on Augmentin  Constitutional: No fever,significant weight change  Eyes: No redness, discharge, pain, vision change ENT/mouth: No nasal congestion, purulent discharge, earache, change in hearing, sore throat  Cardiovascular: No chest pain, palpitations, paroxysmal nocturnal dyspnea, claudication, edema  Respiratory: No cough, sputum production, hemoptysis, DOE, significant snoring, apnea Gastrointestinal: No heartburn, dysphagia, abdominal pain, nausea /vomiting, rectal bleeding, melena, change in bowels Genitourinary: No dysuria, hematuria, pyuria, incontinence, nocturia Musculoskeletal: No joint stiffness, joint  swelling, weakness, pain Dermatologic: No rash, pruritus, new change in appearance of skin Neurologic: No dizziness, headache, syncope, seizures, numbness, tingling Psychiatric: No significant anxiety, depression, insomnia, anorexia Endocrine: No change in hair/skin/ nails, excessive thirst, excessive hunger, excessive urination (note: + for frequency) Hematologic/lymphatic: No significant bruising, lymphadenopathy, abnormal bleeding Allergy/immunology: No itchy/watery eyes, significant sneezing, urticaria, angioedema  Physical exam:  Pertinent or positive findings: The eyebrows are thin laterally. She has slight exotropia of the right eye. The left nasolabial fold is decreased. There is a grade 8/2-9 systolic murmur at the right base. Valgus changes of the knees are present. The left ankle is deformed and deviated laterally . She has a foot drop on the left. She has hyperpigmentation over the left lower extremity greatest over the malleolar areas. There is slight fluctuance over the left medial malleolar area. The left dorsalis pedis pulse and the right posterior tibial pulses are strong. The others are decreased. There is an ulcer with eschar over the lateral base of the fifth left toe. Healing abrasion R knee.Toenails are deformed. She has generalized weakness to opposition.  General appearance: Adequately nourished; no acute distress, increased work of breathing is present.   Lymphatic: No lymphadenopathy about the head, neck, axilla. Eyes: No conjunctival inflammation or lid edema is present. There is no scleral icterus. Ears:  External ear exam shows no significant lesions or deformities.   Nose:  External nasal examination shows no deformity or inflammation. Nasal mucosa are pink and moist without lesions, exudates Oral exam: Lips and gums are healthy appearing.There is no oropharyngeal erythema or exudate. Neck:  No thyromegaly, masses, tenderness noted.    Heart:  Normal rate and regular  rhythm. S1 and S2 normal without gallop, click, rub .  Lungs:  without wheezes, rhonchi, rales, rubs. Abdomen: Bowel sounds are normal.  Abdomen is soft and nontender with no organomegaly, hernias,  masses. GU: Deferred  Extremities:  No cyanosis, clubbing, edema  Neurologic exam:   Balance, Rhomberg, finger to nose testing could not be completed due to clinical state Deep tendon reflexes are equal Skin: Warm & dry w/o tenting. No significant  rash.  See clinical summary under each active problem in the Problem List with associated updated therapeutic plan

## 2017-05-31 NOTE — Patient Instructions (Signed)
See assessment and plan under each diagnosis in the problem list and acutely for this visit 

## 2017-05-31 NOTE — Assessment & Plan Note (Addendum)
Glucoses well controlled on low-dose basal insulin; SSI not instituted at SNF as management of such is problematic A1c well controlled at 6.8% but discordance may be present because of renal insufficiency

## 2017-05-31 NOTE — Assessment & Plan Note (Addendum)
Continue dual therapy with Augmentin and doxycycline with follow-up with Dr. Megan Salon, ID as scheduled Hold probiotic unless she has loose-watery stools, presently describes constipation, probably due to opioids

## 2017-05-31 NOTE — Assessment & Plan Note (Addendum)
Avoid nephrotoxic drugs including metformin  06/05/17 Dr. Hollie Salk, Nephrology follow-up

## 2017-06-04 LAB — BASIC METABOLIC PANEL
BUN: 15 (ref 4–21)
CREATININE: 0.9 (ref 0.5–1.1)
Glucose: 116
Potassium: 4.1 (ref 3.4–5.3)
Sodium: 136 — AB (ref 137–147)

## 2017-06-05 DIAGNOSIS — I129 Hypertensive chronic kidney disease with stage 1 through stage 4 chronic kidney disease, or unspecified chronic kidney disease: Secondary | ICD-10-CM | POA: Diagnosis not present

## 2017-06-05 DIAGNOSIS — I5040 Unspecified combined systolic (congestive) and diastolic (congestive) heart failure: Secondary | ICD-10-CM | POA: Diagnosis not present

## 2017-06-05 DIAGNOSIS — N179 Acute kidney failure, unspecified: Secondary | ICD-10-CM | POA: Diagnosis not present

## 2017-06-05 DIAGNOSIS — N189 Chronic kidney disease, unspecified: Secondary | ICD-10-CM | POA: Diagnosis not present

## 2017-06-05 DIAGNOSIS — I214 Non-ST elevation (NSTEMI) myocardial infarction: Secondary | ICD-10-CM | POA: Diagnosis not present

## 2017-06-05 DIAGNOSIS — M8660 Other chronic osteomyelitis, unspecified site: Secondary | ICD-10-CM | POA: Diagnosis not present

## 2017-06-05 DIAGNOSIS — E1122 Type 2 diabetes mellitus with diabetic chronic kidney disease: Secondary | ICD-10-CM | POA: Diagnosis not present

## 2017-06-06 ENCOUNTER — Non-Acute Institutional Stay (SKILLED_NURSING_FACILITY): Payer: Medicare Other | Admitting: Adult Health

## 2017-06-06 ENCOUNTER — Encounter: Payer: Self-pay | Admitting: Adult Health

## 2017-06-06 ENCOUNTER — Ambulatory Visit: Payer: Medicare Other | Admitting: Physician Assistant

## 2017-06-06 DIAGNOSIS — I10 Essential (primary) hypertension: Secondary | ICD-10-CM | POA: Diagnosis not present

## 2017-06-06 DIAGNOSIS — M79642 Pain in left hand: Secondary | ICD-10-CM

## 2017-06-06 DIAGNOSIS — Z794 Long term (current) use of insulin: Secondary | ICD-10-CM

## 2017-06-06 DIAGNOSIS — E1152 Type 2 diabetes mellitus with diabetic peripheral angiopathy with gangrene: Secondary | ICD-10-CM | POA: Diagnosis not present

## 2017-06-06 NOTE — Progress Notes (Signed)
Location:  [facility] Room Number: 947-M Place of Service:  SNF (31) Provider:  Durenda Age, NP  Patient Care Team: Jani Gravel, MD as PCP - General (Internal Medicine) Nahser, Wonda Cheng, MD as PCP - Cardiology (Cardiology) Thressa Sheller, MD as Consulting Physician (Internal Medicine) Fanny Skates, MD as Consulting Physician (General Surgery) Truitt Merle, MD as Consulting Physician (Hematology) Thea Silversmith, MD (Inactive) as Consulting Physician (Radiation Oncology) Mauro Kaufmann, RN as Registered Nurse Rockwell Germany, RN as Registered Nurse Jake Shark, Johny Blamer, NP as Nurse Practitioner (Nurse Practitioner)  Extended Emergency Contact Information Primary Emergency Contact: Santiago Bur Address: [address]          [place], [place] Phone: [phone] Work Phone: [phone] Relation: Daughter Secondary Emergency Contact: [place] Mobile Phone: [phone] Relation: Son  Code Status:  Full Code  Goals of care: Advanced Directive information Advanced Directives 05/31/2017  Does Patient Have a Medical Advance Directive? Yes  Type of Advance Directive Out of facility DNR (pink MOST or yellow form)  Does patient want to make changes to medical advance directive? No - Patient declined  Copy of [place] in Chart? -  Would patient like information on creating a medical advance directive? No - Patient declined     Chief Complaint  Patient presents with  . Acute Visit    Patient complaining of left hand pain.    HPI:  Pt is an 84 y.o. female seen today for an acute visit for complaints of left hand pain. She was seen in the room and was conversing to medication nurse. She denies hitting her hand on something. No bruising nor edema noted. Noted to have CBGs - 97, 87, 89, 108. She is currently on Lantus 10 units Q HS.  She is a short-term  rehabilitation resident at [facility].  She has a PMH of diabetes, chronic osteomyelitis of left foot, hypertension, acute kidney failure, and acute on chronic combined systolic and diastolic CHF.      Past Medical History:  Diagnosis Date  . Anemia    takes Ferrous Sulfate daily  . Arthritis   . Basal cell carcinoma of breast 1998  . Bruises easily   . Dizziness   . DM (diabetes mellitus) ([place])    takes Metformin daily  . Eczema   . Emphysema lung ([place])   . GERD (gastroesophageal reflux disease)    takes Protonix daily  . Glaucoma    Bil  . History of hiatal hernia   . HTN (hypertension)    takes Diovan daily  . Hyperlipidemia   . ITP (idiopathic thrombocytopenic purpura) 2/09  . Nocturia   . Osteoporosis   . Peripheral edema   . Stroke ([place]) 02/03/09   mid brain, diplopia   Past Surgical History:  Procedure Laterality Date  . BREAST LUMPECTOMY WITH RADIOACTIVE SEED LOCALIZATION Right 10/23/2014   Procedure: RIGHT BREAST LUMPECTOMY WITH RADIOACTIVE SEED LOCALIZATION;  Surgeon: Fanny Skates, MD;  Location: [facility];  Service: General;  Laterality: Right;  . CATARACT EXTRACTION Bilateral   . COLONOSCOPY    . ESOPHAGOGASTRODUODENOSCOPY    . MYOMECTOMY N/A 03/14/1995   late 90's per pt  . PARATHYROIDECTOMY  2008  . REFRACTIVE SURGERY Bilateral   . TONSILLECTOMY  as a child    Allergies  Allergen Reactions  . Travatan Z [Travoprost (Bak Free)] Other (See Comments)    dizziness  Outpatient Encounter Medications as of 06/06/2017  Medication Sig  . amoxicillin-clavulanate (AUGMENTIN) 875-125 MG tablet Take 1 tablet by mouth 2 (two) times daily.  Marland Kitchen aspirin 81 MG tablet Take 81 mg by mouth daily after breakfast.   . carvedilol (COREG) 3.125 MG tablet Take 1 tablet (3.125 mg total) by mouth 2 (two) times daily with a meal.  . Cholecalciferol (VITAMIN D3) 2000 UNITS TABS Take 2,000 Units by mouth daily.  Marland Kitchen doxycycline (MONODOX) 100 MG capsule Take 1  capsule (100 mg total) by mouth 2 (two) times daily.  Marland Kitchen HYDROcodone-acetaminophen (NORCO/VICODIN) 5-325 MG tablet Take 1 tablet by mouth every 8 (eight) hours as needed for moderate pain.  . hydrOXYzine (ATARAX/VISTARIL) 10 MG tablet Take 1 tablet (10 mg total) by mouth every 6 (six) hours as needed for itching.  . insulin glargine (LANTUS) 100 UNIT/ML injection Inject 10 Units into the skin at bedtime.  . isosorbide mononitrate (IMDUR) 30 MG 24 hr tablet Take 0.5 tablets (15 mg total) by mouth daily.  . magnesium oxide (MAG-OX) 400 (241.3 Mg) MG tablet Take 1 tablet (400 mg total) by mouth 2 (two) times daily.  . nitroGLYCERIN (NITROSTAT) 0.4 MG SL tablet Place 1 tablet (0.4 mg total) under the tongue every 5 (five) minutes x 3 doses as needed for chest pain.  . Omega-3 Fatty Acids (FISH OIL) 1000 MG CAPS Take 1 capsule by mouth daily at 12 noon.  . pantoprazole (PROTONIX) 40 MG tablet Take 40 mg by mouth daily after breakfast.   . senna-docusate (SENOKOT-S) 8.6-50 MG tablet Take 1 tablet by mouth at bedtime.  . [DISCONTINUED] Omega 3 1200 MG CAPS Take 1,200 mg by mouth 2 (two) times daily.    No facility-administered encounter medications on file as of 06/06/2017.     Review of Systems  GENERAL: No change in appetite, no fatigue, no weight changes, no fever, chills or weakness MOUTH and THROAT: Denies oral discomfort, gingival pain or bleeding, pain from teeth or hoarseness   RESPIRATORY: no cough, SOB, DOE, wheezing, hemoptysis CARDIAC: No chest pain, edema or palpitations GI: No abdominal pain, diarrhea, constipation, heart burn, nausea or vomiting GU: Denies dysuria, frequency, hematuria, incontinence, or discharge PSYCHIATRIC: Denies feelings of depression or anxiety. No report of hallucinations, insomnia, paranoia, or agitation   Immunization History  Administered Date(s) Administered  . Influenza Split 01/14/2011  . Influenza Whole 01/27/2010  . Influenza-Unspecified 01/09/2011,  11/06/2016  . Pneumococcal Polysaccharide-23 07/04/2007   Pertinent  Health Maintenance Due  Topic Date Due  . FOOT EXAM  09/02/1938  . OPHTHALMOLOGY EXAM  09/02/1938  . URINE MICROALBUMIN  09/02/1938  . PNA vac Low Risk Adult (2 of 2 - PCV13) 07/03/2008  . INFLUENZA VACCINE  09/06/2017  . HEMOGLOBIN A1C  10/18/2017  . DEXA SCAN  Completed   Fall Risk  05/10/2017 04/19/2017  Falls in the past year? Yes No  Number falls in past yr: 1 -  Injury with Fall? No -  Comment "fall in hallway at home" -  Risk for fall due to : Impaired balance/gait -      Vitals:   06/06/17 1042  BP: 140/70  Pulse: 75  Resp: 17  Temp: 98.5 F (36.9 C)  TempSrc: Oral  SpO2: 98%  Weight: 168 lb (76.2 kg)  Height: 5\' 4"  (1.626 m)   Body mass index is 28.84 kg/m.  Physical Exam  GENERAL APPEARANCE: Well nourished. In no acute distress. Normal body habitus MOUTH and THROAT: Lips are without  lesions. Oral mucosa is moist and without lesions. Tongue is normal in shape, size, and color and without lesions RESPIRATORY: Breathing is even & unlabored, BS CTAB CARDIAC: RRR, no murmur,no extra heart sounds, no edema GI: Abdomen soft, normal BS, no masses, no tenderness EXTREMITIES:  Able to move X 4 extremities, left hand is tender PSYCHIATRIC: Alert and oriented X 3. Affect and behavior are appropriate   Labs reviewed: Recent Labs    05/17/17 0313 05/18/17 0419  05/27/17 0247 05/28/17 0342 05/29/17 0349  NA 133* 132*   < > 136 132* 135  K 5.2* 5.6*   < > 4.5 3.9 4.0  CL 104 103   < > 103 100* 103  CO2 18* 18*   < > 19* 19* 20*  GLUCOSE 148* 136*   < > 118* 108* 119*  BUN 57* 74*   < > 117* 102* 105*  CREATININE 5.78* 6.29*   < > 4.86* 4.17* 3.76*  CALCIUM 7.8* 8.3*   < > 8.6* 8.5* 8.6*  MG 1.8 1.7  --   --   --  1.2*  PHOS  --  6.8*   < > 6.8* 5.5* 5.5*   < > = values in this interval not displayed.   Recent Labs    04/17/17 1146 05/17/17 0313  05/21/17 0317  05/27/17 0247  05/28/17 0342 05/29/17 0349  AST 21 144*  --  25  --   --   --   --   ALT 14 127*  --  60*  --   --   --   --   ALKPHOS  --  73  --  56  --   --   --   --   BILITOT 0.5 0.6  --  0.6  --   --   --   --   PROT 6.5 5.0*  --  5.1*  --   --   --   --   ALBUMIN  --  1.9*   < > 1.8*   < > 2.2* 2.2* 2.2*   < > = values in this interval not displayed.   Recent Labs    04/17/17 1146 05/12/17 2326  05/13/17 0944  05/25/17 0222 05/26/17 0312 05/28/17 0342  WBC 4.5 9.6  --  11.9*   < > 6.7 8.0 7.7  NEUTROABS 2,723 8.1*  --  9.2*  --   --   --   --   HGB 13.3 16.3*   < > 12.1   < > 9.5* 9.9* 9.7*  HCT 39.5 49.5*   < > 37.5   < > 28.5* 28.8* 29.9*  MCV 83.3 85.3  --  84.7   < > 83.6 83.5 84.7  PLT 148 214  --  234   < > 298 301 288   < > = values in this interval not displayed.   Lab Results  Component Value Date   TSH 2.057 Test methodology is 3rd generation TSH 02/04/2009    Lab Results  Component Value Date   HGBA1C 6.8 (H) 04/17/2017   Lab Results  Component Value Date   CHOL 113 05/13/2017   HDL 52 05/13/2017   LDLCALC 55 05/13/2017   TRIG 28 05/13/2017   CHOLHDL 2.2 05/13/2017    Significant Diagnostic Results in last 30 days:  Dg Chest 2 View  Result Date: 05/13/2017 CLINICAL DATA:  84 y/o  F; right shoulder pain.  Hypotension. EXAM: CHEST - 2 VIEW COMPARISON:  01/09/2008 chest CT. FINDINGS: Stable enlarged cardiac silhouette given projection and technique. Calcific aortic atherosclerosis. Large hiatal hernia. Pulmonary vascular congestion. No pleural effusion or pneumothorax. Bones are unremarkable. IMPRESSION: Large hiatal hernia. Pulmonary vascular congestion. Mild cardiomegaly. Aortic atherosclerosis. Electronically Signed   By: Kristine Garbe M.D.   On: 05/13/2017 00:13   Dg Shoulder Right  Result Date: 05/27/2017 CLINICAL DATA:  Shoulder pain, no known injury, subsequent encounter EXAM: RIGHT SHOULDER - 2+ VIEW COMPARISON:  05/12/2017 FINDINGS: Degenerative  changes of the acromioclavicular joint are seen. Glenohumeral degenerative changes are noted as well. No acute fracture or dislocation is noted. The underlying bony thorax is within normal limits. IMPRESSION: No acute abnormality noted. Electronically Signed   By: Inez Catalina M.D.   On: 05/27/2017 15:06   Dg Shoulder Right  Result Date: 05/13/2017 CLINICAL DATA:  84 y/o  F; 2 weeks of right shoulder pain. EXAM: RIGHT SHOULDER - 2+ VIEW COMPARISON:  None. FINDINGS: There is no evidence of fracture or dislocation. Mild osteoarthrosis of the glenohumeral joint with small osteophytes at the inferior margin of humeral head. Soft tissues are unremarkable. IMPRESSION: 1.  No acute fracture or dislocation identified. 2. Mild osteoarthrosis of the glenohumeral joint. Electronically Signed   By: Kristine Garbe M.D.   On: 05/13/2017 00:16   Dg Forearm Right  Result Date: 05/27/2017 CLINICAL DATA:  Forearm pain, initial encounter EXAM: RIGHT FOREARM - 2 VIEW COMPARISON:  None. FINDINGS: There is no evidence of fracture or other focal bone lesions. Soft tissues are unremarkable. IMPRESSION: No acute abnormality noted. Electronically Signed   By: Inez Catalina M.D.   On: 05/27/2017 15:07   Ct Head Wo Contrast  Result Date: 05/13/2017 CLINICAL DATA:  Hypotensive. History of stroke in 2010. Right shoulder pain. EXAM: CT HEAD WITHOUT CONTRAST CT CERVICAL SPINE WITHOUT CONTRAST TECHNIQUE: Multidetector CT imaging of the head and cervical spine was performed following the standard protocol without intravenous contrast. Multiplanar CT image reconstructions of the cervical spine were also generated. COMPARISON:  CT and MRI exams of the brain from 02/03/2009 FINDINGS: CT HEAD FINDINGS Brain: Atrophy with chronic moderate small vessel ischemia. No large vascular territory infarct. No midline shift or edema. No intra-axial mass nor extra-axial fluid collections. Midline fourth ventricle and basal cisterns without  effacement. Vascular: Atherosclerosis of the cavernous carotid arteries bilaterally. No hyperdense vessel sign. Skull: No skull fracture or suspicious osseous lesions. Sinuses/Orbits: Bilateral cataract extractions. Mild ethmoid sinus mucosal thickening. No acute sinus disease. No mastoid effusion. Other: None CT CERVICAL SPINE FINDINGS Alignment: Maintained cervical lordosis. Intact craniocervical relationship and atlantodental interval. No jumped or perched facets. Skull base and vertebrae: Intact skull base.  No vertebral fracture. Soft tissues and spinal canal: No prevertebral soft tissue swelling. No visible canal hematoma. Disc levels: Moderate to marked disc space narrowing at C5-6 and C6-7. Uncovertebral joint osteoarthritis noted at C5-6 and C6-7 bilaterally contributing to mild left C5-6 and mild right C6-7 neural foraminal encroachment. Multilevel degenerative facet arthropathy is visualized most prominent with subcortical cystic change at C3-4 on the left. Upper chest: Negative Other: None IMPRESSION: 1. Atrophy with chronic moderate small vessel ischemia. No acute intracranial abnormality. 2. No acute cervical spine fracture or listhesis. 3. Degenerative disc disease C5-6 and C6-7 with uncovertebral joint osteoarthritis and uncinate spurring contributing to mild left C5-6 and right C6-7 neural foraminal encroachment. Multilevel degenerative facet arthropathy noted. Electronically Signed   By: Ashley Royalty M.D.   On: 05/13/2017 01:40   Ct Cervical  Spine Wo Contrast  Result Date: 05/13/2017 CLINICAL DATA:  Hypotensive. History of stroke in 2010. Right shoulder pain. EXAM: CT HEAD WITHOUT CONTRAST CT CERVICAL SPINE WITHOUT CONTRAST TECHNIQUE: Multidetector CT imaging of the head and cervical spine was performed following the standard protocol without intravenous contrast. Multiplanar CT image reconstructions of the cervical spine were also generated. COMPARISON:  CT and MRI exams of the brain from  02/03/2009 FINDINGS: CT HEAD FINDINGS Brain: Atrophy with chronic moderate small vessel ischemia. No large vascular territory infarct. No midline shift or edema. No intra-axial mass nor extra-axial fluid collections. Midline fourth ventricle and basal cisterns without effacement. Vascular: Atherosclerosis of the cavernous carotid arteries bilaterally. No hyperdense vessel sign. Skull: No skull fracture or suspicious osseous lesions. Sinuses/Orbits: Bilateral cataract extractions. Mild ethmoid sinus mucosal thickening. No acute sinus disease. No mastoid effusion. Other: None CT CERVICAL SPINE FINDINGS Alignment: Maintained cervical lordosis. Intact craniocervical relationship and atlantodental interval. No jumped or perched facets. Skull base and vertebrae: Intact skull base.  No vertebral fracture. Soft tissues and spinal canal: No prevertebral soft tissue swelling. No visible canal hematoma. Disc levels: Moderate to marked disc space narrowing at C5-6 and C6-7. Uncovertebral joint osteoarthritis noted at C5-6 and C6-7 bilaterally contributing to mild left C5-6 and mild right C6-7 neural foraminal encroachment. Multilevel degenerative facet arthropathy is visualized most prominent with subcortical cystic change at C3-4 on the left. Upper chest: Negative Other: None IMPRESSION: 1. Atrophy with chronic moderate small vessel ischemia. No acute intracranial abnormality. 2. No acute cervical spine fracture or listhesis. 3. Degenerative disc disease C5-6 and C6-7 with uncovertebral joint osteoarthritis and uncinate spurring contributing to mild left C5-6 and right C6-7 neural foraminal encroachment. Multilevel degenerative facet arthropathy noted. Electronically Signed   By: Ashley Royalty M.D.   On: 05/13/2017 01:40   US Renal  Result Date: 05/14/2017 CLINICAL DATA:  Acute kidney failure EXAM: RENAL / URINARY TRACT ULTRASOUND COMPLETE COMPARISON:  None. FINDINGS: Right Kidney: Length: 12.2 cm. Increased cortical  echogenicity. No hydronephrosis. Left Kidney: Length: 11.4 cm. Increased cortical echogenicity. No hydronephrosis. Small exophytic cyst measuring 1.8 cm. Bladder: Small echogenic debris within the bladder. IMPRESSION: 1. Increased cortical echogenicity consistent with medical renal disease. No hydronephrosis 2. Small amount of echogenic debris within the bladder 3. Small cyst in the left kidney Electronically Signed   By: Donavan Foil M.D.   On: 05/14/2017 18:37   Mr Tibia Fibula Left Wo Contrast  Result Date: 05/23/2017 CLINICAL DATA:  84 year old female with diabetes and hypertension. History of basal cell carcinoma of the breast in 1998. Patient is on chronic oral antibiotics for left foot osteomyelitis. EXAM: MRI OF LOWER LEFT EXTREMITY WITHOUT CONTRAST TECHNIQUE: Multiplanar, multisequence MR imaging of the left leg was performed. No intravenous contrast was administered. COMPARISON:  None. FINDINGS: Bones/Joint/Cartilage Osteoarthritis of the patellofemoral and femorotibial compartments of the included knees. The left ankle is externally rotated on the frontal projection which may account for the subluxed appearance on the sagittal projection. No marrow signal abnormality, fracture nor frank bone destruction. No evidence of acute osteomyelitis of both legs. Ligaments Noncontributory Muscles and Tendons Intramuscular edema consistent with diffuse myositis of the gastrocnemius, soleus and anterior tibialis muscles on the left. Soft tissues Left-greater-than-right subcutaneous soft tissue edema either representing cellulitis or possibly third spacing of fluid. IMPRESSION: 1. Subcutaneous soft tissue edema of both legs left greater than right with intramuscular edema of the left gastrocnemius, soleus and tibialis anterior muscles consistent with a myositis. No evidence of  pyomyositis. 2. No marrow signal abnormality to suggest acute osteo osteomyelitis nor fracture. No suspicious osseous lesions. 3.  Tricompartmental osteoarthritis of the knees. Electronically Signed   By: Ashley Royalty M.D.   On: 05/23/2017 22:29   Dg Chest Port 1 View  Result Date: 05/16/2017 CLINICAL DATA:  CHF, shortness of breath. EXAM: PORTABLE CHEST 1 VIEW COMPARISON:  Chest x-rays dated 05/12/2017 and 06/29/2006. FINDINGS: Cardiomegaly appears stable compared to the most recent chest x-ray of 05/12/2017. Overall cardiomediastinal silhouette appears stable. Atherosclerotic changes again noted at the aortic arch. Platelike opacity overlying the RIGHT hilum, atelectasis versus pneumonia. Lungs otherwise clear. No pleural effusion or pneumothorax seen. IMPRESSION: 1. RIGHT perihilar atelectasis versus pneumonia. Recommend follow-up chest x-ray to ensure resolution. 2. Stable cardiomegaly. 3. Aortic atherosclerosis. Electronically Signed   By: Franki Cabot M.D.   On: 05/16/2017 08:59   Dg Humerus Right  Result Date: 05/27/2017 CLINICAL DATA:  Right arm pain, no known injury EXAM: RIGHT HUMERUS - 2+ VIEW COMPARISON:  None. FINDINGS: Degenerative changes of the glenohumeral articulation are seen. No acute fracture or dislocation is noted. No soft tissue abnormality is seen. IMPRESSION: No acute abnormality noted. Electronically Signed   By: Inez Catalina M.D.   On: 05/27/2017 15:07    Assessment/Plan  1. Left hand pain - x-ray of left hand showed no acute abnormality, will continue Norco 5-325 mg 1 tab PO Q 8 hours PRN  2. Type 2 diabetes mellitus with diabetic peripheral angiopathy and gangrene, with long-term current use of insulin (HCC) - decrease Lantus 100 units/ml from 10 units to 5 units SQ Q HS   3. Essential hypertension -  Well-controlled, Continue Carvedilol 3.125 mg 1 tab BID    Family/ staff Communication: Discussed plan of care with resident.  Labs/tests ordered:  X-ray of left hand  Goals of care:  Short-term care   Durenda Age, NP [facility] [phone]  (Monday-Friday 8:00 a.m. - 5:00 p.m.) [phone] (after hours)

## 2017-06-14 ENCOUNTER — Encounter: Payer: Self-pay | Admitting: Internal Medicine

## 2017-06-14 ENCOUNTER — Ambulatory Visit (INDEPENDENT_AMBULATORY_CARE_PROVIDER_SITE_OTHER): Payer: Medicare Other | Admitting: Internal Medicine

## 2017-06-14 DIAGNOSIS — M86672 Other chronic osteomyelitis, left ankle and foot: Secondary | ICD-10-CM | POA: Diagnosis not present

## 2017-06-14 NOTE — Patient Instructions (Signed)
1 month follow up

## 2017-06-14 NOTE — Progress Notes (Signed)
[name] for Infectious Disease  Patient Active Problem List   Diagnosis Date Noted  . Chronic osteomyelitis of left foot ([place]) 04/10/2017    Priority: High  . Pressure injury of skin 05/16/2017  . Acute kidney failure ([place])   . Acute on chronic combined systolic and diastolic CHF (congestive heart failure) ([place])   . NSTEMI (non-ST elevated myocardial infarction) ([place]) 05/13/2017  . HTN (hypertension) 05/13/2017  . Diabetes mellitus ([place]) 04/10/2017  . Venous stasis dermatitis of both lower extremities 04/10/2017  . Neuropathy 04/10/2017  . Breast cancer of upper-inner quadrant of right female breast ([place]) 10/05/2014  . Occult blood positive stool 03/24/2014  . History of ITP 04/14/2011    Patient's Medications  New Prescriptions   No medications on file  Previous Medications   ASPIRIN 81 MG TABLET    Take 81 mg by mouth daily after breakfast.    CARVEDILOL (COREG) 3.125 MG TABLET    Take 1 tablet (3.125 mg total) by mouth 2 (two) times daily with a meal.   CHOLECALCIFEROL (VITAMIN D3) 2000 UNITS TABS    Take 2,000 Units by mouth daily.   HYDROCODONE-ACETAMINOPHEN (NORCO/VICODIN) 5-325 MG TABLET    Take 1 tablet by mouth every 8 (eight) hours as needed for moderate pain.   HYDROXYZINE (ATARAX/VISTARIL) 10 MG TABLET    Take 1 tablet (10 mg total) by mouth every 6 (six) hours as needed for itching.   INSULIN GLARGINE (LANTUS) 100 UNIT/ML INJECTION    Inject 10 Units into the skin at bedtime.   ISOSORBIDE MONONITRATE (IMDUR) 30 MG 24 HR TABLET    Take 0.5 tablets (15 mg total) by mouth daily.   MAGNESIUM OXIDE (MAG-OX) 400 (241.3 MG) MG TABLET    Take 1 tablet (400 mg total) by mouth 2 (two) times daily.   NITROGLYCERIN (NITROSTAT) 0.4 MG SL TABLET    Place 1 tablet (0.4 mg total) under the tongue every 5 (five) minutes x 3 doses as needed for chest pain.   OMEGA-3 FATTY ACIDS (FISH OIL) 1000 MG CAPS    Take 1 capsule by mouth daily at 12 noon.   PANTOPRAZOLE  (PROTONIX) 40 MG TABLET    Take 40 mg by mouth daily after breakfast.    SENNA-DOCUSATE (SENOKOT-S) 8.6-50 MG TABLET    Take 1 tablet by mouth at bedtime.  Modified Medications   No medications on file  Discontinued Medications   AMOXICILLIN-CLAVULANATE (AUGMENTIN) 875-125 MG TABLET    Take 1 tablet by mouth 2 (two) times daily.   DOXYCYCLINE (MONODOX) 100 MG CAPSULE    Take 1 capsule (100 mg total) by mouth 2 (two) times daily.    Subjective: Chelsea [name] is in for her routine follow-up appointment.  She is a 84 y.o. female with diabetes, peripheral neuropathy and venous stasis.  She recently developed cellulitis of her left leg in January and was treated with empiric cephalexin and doxycycline.  She developed some venous stasis ulcers on her left lower leg and was referred to the wound center.  The leg ulcers improved but she developed an open wound under an area where she had had a callus over the left fifth metatarsal head.  She had exposed bone and foul-smelling drainage.  A culture of drainage was obtained on 03/08/2017.  Gram stain showed gram-positive cocci in pairs, gram-negative rods and gram-positive rods.  Cultures grew antibiotic susceptible Proteus and enterococcus.  MRI showed marrow edema and cortical destruction of the  left fifth metatarsal head compatible with osteomyelitis.    She continues to take amoxicillin clavulanate and doxycycline.  She has had no problems tolerating her antibiotics but does not feel like her foot is getting better.  When I saw her last week she was not certain if she wanted to go on intravenous antibiotics or whether she would agree to have surgery.  She saw Dr. Jacqualyn [name], her podiatrist, on 04/17/2017 and discussed the possibility of surgery again.  She saw him again on 05/01/2017 and surgery was scheduled.  She met with Dr. Dellia [name] at the wound center about a month ago and was told that her wound was looking better so she decided to cancel surgery.  She is not having any  problems tolerating her antibiotics.  She was hospitalized recently with an NSTEMI.  She is currently in a skilled nursing facility.  She is very concerned because she is constipated.  She is not sure what she is being given for constipation.  She is also very upset about being required to wear her Velcro boot on her left foot when she is in bed at night.   Review of Systems: Review of Systems  Constitutional: Negative for chills, diaphoresis and fever.  Cardiovascular: Negative for chest pain.  Gastrointestinal: Positive for constipation. Negative for abdominal pain, diarrhea, nausea and vomiting.  Musculoskeletal: Positive for joint pain.    Past Medical History:  Diagnosis Date  . Anemia    takes Ferrous Sulfate daily  . Arthritis   . Basal cell carcinoma of breast 1998  . Bruises easily   . Dizziness   . DM (diabetes mellitus) ([place])    takes Metformin daily  . Eczema   . Emphysema lung ([place])   . GERD (gastroesophageal reflux disease)    takes Protonix daily  . Glaucoma    Bil  . History of hiatal hernia   . HTN (hypertension)    takes Diovan daily  . Hyperlipidemia   . ITP (idiopathic thrombocytopenic purpura) 2/09  . Nocturia   . Osteoporosis   . Peripheral edema   . Stroke ([place]) 02/03/09   mid brain, diplopia    Social History   Tobacco Use  . Smoking status: Never Smoker  . Smokeless tobacco: Never Used  Substance Use Topics  . Alcohol use: No    Alcohol/week: 0.0 oz  . Drug use: No    Family History  Problem Relation Age of Onset  . Breast cancer Mother 39  . Colon cancer Father   . Diabetes Father        questionable  . Multiple sclerosis Daughter   . Heart disease Maternal Grandfather     Allergies  Allergen Reactions  . Travatan Z [Travoprost (Bak Free)] Other (See Comments)    dizziness    Objective: Vitals:   06/14/17 0953  BP: 121/75  Pulse: 83  Temp: (!) 97.5 F (36.4 C)  SpO2: 94%  Weight: 168 lb (76.2 kg)  Height: '5\' 2"'   (1.575 m)   Body mass index is 30.73 kg/m.  Physical Exam  Constitutional: She is oriented to person, place, and time.  She is accompanied by her daughter, Chelsea [name].  She is seated in a wheelchair.  Musculoskeletal: She exhibits edema. She exhibits no tenderness.  The ulcer on her left lateral foot adjacent to the left fifth metatarsal head has healed.  She has some dark callus overlying bone.  There is no exposed bone.  There is no odor, drainage or surrounding cellulitis.  Neurological: She is alert and oriented to person, place, and time.  Skin: No rash noted.  Psychiatric: Mood and affect normal.    Lab Results Sed Rate  Date Value  04/17/2017 9 mm/h  02/04/2009 11 mm/hr   CRP  Date Value  04/17/2017 3.4 mg/L  02/04/2009 0.8 mg/dL (H)     Problem List Items Addressed This Visit      High   Chronic osteomyelitis of left foot (HCC) (Chronic)    That her left foot osteomyelitis has been cured following nearly 3 months of broad antibiotic therapy.  I will have her stop amoxicillin clavulanate and doxycycline now.  She will follow-up here in 1 month.  I have asked that she speak with Dr. Unice Cobble at her skilled nursing facility about her concerns over constipation and her left foot boot.          Michel Bickers, MD [facility] for [facility] [phone] pager   [phone] cell 06/14/2017, 10:49 AM

## 2017-06-14 NOTE — Assessment & Plan Note (Signed)
That her left foot osteomyelitis has been cured following nearly 3 months of broad antibiotic therapy.  I will have her stop amoxicillin clavulanate and doxycycline now.  She will follow-up here in 1 month.  I have asked that she speak with Dr. Unice Cobble at her skilled nursing facility about her concerns over constipation and her left foot boot.

## 2017-06-18 DIAGNOSIS — L988 Other specified disorders of the skin and subcutaneous tissue: Secondary | ICD-10-CM | POA: Diagnosis not present

## 2017-06-25 ENCOUNTER — Non-Acute Institutional Stay (SKILLED_NURSING_FACILITY): Payer: Medicare Other | Admitting: Adult Health

## 2017-06-25 ENCOUNTER — Encounter: Payer: Self-pay | Admitting: Adult Health

## 2017-06-25 DIAGNOSIS — K5901 Slow transit constipation: Secondary | ICD-10-CM | POA: Diagnosis not present

## 2017-06-25 DIAGNOSIS — I10 Essential (primary) hypertension: Secondary | ICD-10-CM | POA: Diagnosis not present

## 2017-06-25 DIAGNOSIS — M86672 Other chronic osteomyelitis, left ankle and foot: Secondary | ICD-10-CM | POA: Diagnosis not present

## 2017-06-25 DIAGNOSIS — I252 Old myocardial infarction: Secondary | ICD-10-CM

## 2017-06-25 DIAGNOSIS — Z794 Long term (current) use of insulin: Secondary | ICD-10-CM

## 2017-06-25 DIAGNOSIS — K219 Gastro-esophageal reflux disease without esophagitis: Secondary | ICD-10-CM | POA: Diagnosis not present

## 2017-06-25 DIAGNOSIS — L988 Other specified disorders of the skin and subcutaneous tissue: Secondary | ICD-10-CM | POA: Diagnosis not present

## 2017-06-25 DIAGNOSIS — E1152 Type 2 diabetes mellitus with diabetic peripheral angiopathy with gangrene: Secondary | ICD-10-CM

## 2017-06-25 NOTE — Progress Notes (Signed)
Location:  [facility] Room Number: 830-N Place of Service:  SNF (31) Provider:  Durenda Age, NP  Patient Care Team: Jani Gravel, MD as PCP - General (Internal Medicine) Nahser, Wonda Cheng, MD as PCP - Cardiology (Cardiology) Thressa Sheller, MD as Consulting Physician (Internal Medicine) Fanny Skates, MD as Consulting Physician (General Surgery) Truitt Merle, MD as Consulting Physician (Hematology) Thea Silversmith, MD as Consulting Physician (Radiation Oncology) Mauro Kaufmann, RN as Registered Nurse Rockwell Germany, RN as Registered Nurse Jake Shark, Johny Blamer, NP as Nurse Practitioner (Nurse Practitioner)  Extended Emergency Contact Information Primary Emergency Contact: Santiago Bur Address: [address]          [place], [place] Phone: [phone] Work Phone: [phone] Relation: Daughter Secondary Emergency Contact: [place] of [place] Mobile Phone: [phone] Relation: Son  Code Status:  Full Code  Goals of care: Advanced Directive information Advanced Directives 05/31/2017  Does Patient Have a Medical Advance Directive? Yes  Type of Advance Directive Out of facility DNR (pink MOST or yellow form)  Does patient want to make changes to medical advance directive? No - Patient declined  Copy of [place] in Chart? -  Would patient like information on creating a medical advance directive? No - Patient declined     Chief Complaint  Patient presents with  . Medical Management of Chronic Issues    Routine [facility] visit    HPI:  Pt is an 84 y.o. female seen today for medical management of chronic diseases.  She is a short-term rehabilitation resident at [facility].  She has a PMH of diabetes, chronic osteomyelitis of her left foot, HTN, acute kidney failure, and acute on chronic combined systolic and diastolic congestive  heart failure. She was seen in her room today. She verbalized being constipated. She recently followed up with Infectious Disease, Dr. Michel Bickers, and Amoxicillin Clavulanate and Doxycycline was discontinued after 3 months and report stated that he she is cured of osteomyelitis on left foot. She has been having difficulty wearing her velcro splint and will see [employer] tomorrow to evaluate proper fitting. PT said that velcro splint is loose due to the fact that her left foot swelling has decreased.   Past Medical History:  Diagnosis Date  . Anemia    takes Ferrous Sulfate daily  . Arthritis   . Basal cell carcinoma of breast 1998  . Bruises easily   . Dizziness   . DM (diabetes mellitus) ([place])    takes Metformin daily  . Eczema   . Emphysema lung ([place])   . GERD (gastroesophageal reflux disease)    takes Protonix daily  . Glaucoma    Bil  . History of hiatal hernia   . HTN (hypertension)    takes Diovan daily  . Hyperlipidemia   . ITP (idiopathic thrombocytopenic purpura) 2/09  . Nocturia   . Osteoporosis   . Peripheral edema   . Stroke ([place]) 02/03/09   mid brain, diplopia   Past Surgical History:  Procedure Laterality Date  . BREAST LUMPECTOMY WITH RADIOACTIVE SEED LOCALIZATION Right 10/23/2014   Procedure: RIGHT BREAST LUMPECTOMY WITH RADIOACTIVE SEED LOCALIZATION;  Surgeon: Fanny Skates, MD;  Location: [facility];  Service: General;  Laterality: Right;  . CATARACT EXTRACTION Bilateral   . COLONOSCOPY    . ESOPHAGOGASTRODUODENOSCOPY    . MYOMECTOMY N/A 03/14/1995   late 90's per pt  . PARATHYROIDECTOMY  2008  . REFRACTIVE  SURGERY Bilateral   . TONSILLECTOMY  as a child    Allergies  Allergen Reactions  . Travatan Z [Travoprost (Bak Free)] Other (See Comments)    dizziness    Outpatient Encounter Medications as of 06/25/2017  Medication Sig  . Amino Acids-Protein Hydrolys (FEEDING SUPPLEMENT, PRO-STAT SUGAR FREE 64,) LIQD Take 30 mLs by mouth daily.  Marland Kitchen aspirin 81 MG  tablet Take 81 mg by mouth daily after breakfast.   . carvedilol (COREG) 3.125 MG tablet Take 1 tablet (3.125 mg total) by mouth 2 (two) times daily with a meal.  . Cholecalciferol (VITAMIN D3) 2000 UNITS TABS Take 2,000 Units by mouth daily.  Marland Kitchen HYDROcodone-acetaminophen (NORCO/VICODIN) 5-325 MG tablet Take 1 tablet by mouth every 8 (eight) hours as needed for moderate pain.  . hydrOXYzine (ATARAX/VISTARIL) 10 MG tablet Take 1 tablet (10 mg total) by mouth every 6 (six) hours as needed for itching.  . insulin glargine (LANTUS) 100 UNIT/ML injection Inject 5 Units into the skin at bedtime.   . isosorbide mononitrate (IMDUR) 30 MG 24 hr tablet Take 0.5 tablets (15 mg total) by mouth daily.  Marland Kitchen lactulose (CHRONULAC) 10 GM/15ML solution Take 30 g by mouth 2 (two) times daily.  . magnesium oxide (MAG-OX) 400 (241.3 Mg) MG tablet Take 1 tablet (400 mg total) by mouth 2 (two) times daily.  . Multiple Vitamins-Minerals (MULTIVITAMIN WITH MINERALS) tablet Take 1 tablet by mouth daily.  . nitroGLYCERIN (NITROSTAT) 0.4 MG SL tablet Place 1 tablet (0.4 mg total) under the tongue every 5 (five) minutes x 3 doses as needed for chest pain.  . Omega-3 Fatty Acids (FISH OIL) 1000 MG CAPS Take 1 capsule by mouth daily at 12 noon.  . pantoprazole (PROTONIX) 40 MG tablet Take 40 mg by mouth daily after breakfast.   . polyethylene glycol (MIRALAX / GLYCOLAX) packet Take 17 g by mouth daily.  Marland Kitchen senna-docusate (SENOKOT-S) 8.6-50 MG tablet Take 1 tablet by mouth at bedtime.   No facility-administered encounter medications on file as of 06/25/2017.     Review of Systems  GENERAL: No change in appetite, no fatigue, no weight changes, no fever, chills or weakness MOUTH and THROAT: Denies oral discomfort, gingival pain or bleeding RESPIRATORY: no cough, SOB, DOE, wheezing, hemoptysis CARDIAC: No chest pain, or palpitations GI: No abdominal pain, diarrhea, constipation, heart burn, nausea or vomiting GU: Denies dysuria,  frequency, hematuria, or discharge PSYCHIATRIC: Denies feelings of depression or anxiety. No report of hallucinations, insomnia, paranoia, or agitation   Immunization History  Administered Date(s) Administered  . Influenza Split 01/14/2011  . Influenza Whole 01/27/2010  . Influenza-Unspecified 01/09/2011, 11/06/2016  . Pneumococcal Polysaccharide-23 07/04/2007   Pertinent  Health Maintenance Due  Topic Date Due  . FOOT EXAM  09/02/1938  . OPHTHALMOLOGY EXAM  09/02/1938  . URINE MICROALBUMIN  09/02/1938  . PNA vac Low Risk Adult (2 of 2 - PCV13) 07/03/2008  . INFLUENZA VACCINE  09/06/2017  . HEMOGLOBIN A1C  10/18/2017  . DEXA SCAN  Completed   Fall Risk  06/14/2017 05/10/2017 04/19/2017  Falls in the past year? Yes Yes No  Number falls in past yr: - 1 -  Injury with Fall? Yes No -  Comment - "fall in hallway at home" -  Risk for fall due to : - Impaired balance/gait -      Vitals:   06/25/17 0901  BP: 127/69  Pulse: 72  Resp: 18  Temp: 98.8 F (37.1 C)  TempSrc: Oral  SpO2: 93%  Weight: 163 lb 12.8 oz (74.3 kg)  Height: '5\' 2"'  (1.575 m)   Body mass index is 29.96 kg/m.  Physical Exam  GENERAL APPEARANCE: Well nourished. In no acute distress. Normal body habitus SKIN:  Left 5th metatarsal has dark callus/scab, dry MOUTH and THROAT: Lips are without lesions. Oral mucosa is moist and without lesions. Tongue is normal in shape, size, and color and without lesions RESPIRATORY: Breathing is even & unlabored, BS CTAB CARDIAC: RRR, no murmur,no extra heart sounds, LLE trace edema GI: Abdomen soft, normal BS, no masses, no tenderness, no hepatomegaly, no splenomegaly EXTREMITIES: Able to move X 4 extremities, limited movement on LLE  PSYCHIATRIC: Alert and oriented X 3. Affect and behavior are appropriate  Labs reviewed: 06/04/17  Creatinine 1.37  BUN 38.2  Glucose 90  Ca 8.6  Na 140  K 3.9  eGFR 34.76 Recent Labs    05/17/17 0313 05/18/17 0419  05/27/17 0247  05/28/17 0342 05/29/17 0349  NA 133* 132*   < > 136 132* 135  K 5.2* 5.6*   < > 4.5 3.9 4.0  CL 104 103   < > 103 100* 103  CO2 18* 18*   < > 19* 19* 20*  GLUCOSE 148* 136*   < > 118* 108* 119*  BUN 57* 74*   < > 117* 102* 105*  CREATININE 5.78* 6.29*   < > 4.86* 4.17* 3.76*  CALCIUM 7.8* 8.3*   < > 8.6* 8.5* 8.6*  MG 1.8 1.7  --   --   --  1.2*  PHOS  --  6.8*   < > 6.8* 5.5* 5.5*   < > = values in this interval not displayed.   Recent Labs    04/17/17 1146 05/17/17 0313  05/21/17 0317  05/27/17 0247 05/28/17 0342 05/29/17 0349  AST 21 144*  --  25  --   --   --   --   ALT 14 127*  --  60*  --   --   --   --   ALKPHOS  --  73  --  56  --   --   --   --   BILITOT 0.5 0.6  --  0.6  --   --   --   --   PROT 6.5 5.0*  --  5.1*  --   --   --   --   ALBUMIN  --  1.9*   < > 1.8*   < > 2.2* 2.2* 2.2*   < > = values in this interval not displayed.   Recent Labs    04/17/17 1146 05/12/17 2326  05/13/17 0944  05/25/17 0222 05/26/17 0312 05/28/17 0342  WBC 4.5 9.6  --  11.9*   < > 6.7 8.0 7.7  NEUTROABS 2,723 8.1*  --  9.2*  --   --   --   --   HGB 13.3 16.3*   < > 12.1   < > 9.5* 9.9* 9.7*  HCT 39.5 49.5*   < > 37.5   < > 28.5* 28.8* 29.9*  MCV 83.3 85.3  --  84.7   < > 83.6 83.5 84.7  PLT 148 214  --  234   < > 298 301 288   < > = values in this interval not displayed.   Lab Results  Component Value Date   TSH 2.057 Test methodology is 3rd generation TSH 02/04/2009   Lab Results  Component Value Date  HGBA1C 6.8 (H) 04/17/2017   Lab Results  Component Value Date   CHOL 113 05/13/2017   HDL 52 05/13/2017   LDLCALC 55 05/13/2017   TRIG 28 05/13/2017   CHOLHDL 2.2 05/13/2017    Significant Diagnostic Results in last 30 days:  Dg Shoulder Right  Result Date: 05/27/2017 CLINICAL DATA:  Shoulder pain, no known injury, subsequent encounter EXAM: RIGHT SHOULDER - 2+ VIEW COMPARISON:  05/12/2017 FINDINGS: Degenerative changes of the acromioclavicular joint are seen.  Glenohumeral degenerative changes are noted as well. No acute fracture or dislocation is noted. The underlying bony thorax is within normal limits. IMPRESSION: No acute abnormality noted. Electronically Signed   By: Inez Catalina M.D.   On: 05/27/2017 15:06   Dg Forearm Right  Result Date: 05/27/2017 CLINICAL DATA:  Forearm pain, initial encounter EXAM: RIGHT FOREARM - 2 VIEW COMPARISON:  None. FINDINGS: There is no evidence of fracture or other focal bone lesions. Soft tissues are unremarkable. IMPRESSION: No acute abnormality noted. Electronically Signed   By: Inez Catalina M.D.   On: 05/27/2017 15:07   Dg Humerus Right  Result Date: 05/27/2017 CLINICAL DATA:  Right arm pain, no known injury EXAM: RIGHT HUMERUS - 2+ VIEW COMPARISON:  None. FINDINGS: Degenerative changes of the glenohumeral articulation are seen. No acute fracture or dislocation is noted. No soft tissue abnormality is seen. IMPRESSION: No acute abnormality noted. Electronically Signed   By: Inez Catalina M.D.   On: 05/27/2017 15:07    Assessment/Plan  1. History of non-ST elevation myocardial infarction (NSTEMI) - verbalized no complaints of pain on bilateral shoulders, no chest pains, continue NTG PRN, ASA 81 mg daily, Isosorbide MN ER 30 mg 1/2 tab = 15 mg daily   2. Chronic osteomyelitis of left foot (HCC) - recently followed-up with Dr. Michel Bickers, ID, and Amoxicillin Clavulanate and Doxycycline was discontinued, for follow up in 1 month, was told that osteomyelitis has been resolved   3. Type 2 diabetes mellitus with diabetic peripheral angiopathy and gangrene, with long-term current use of insulin (HCC) - CBGs well-controlled, continue Lantus 100 units/ml inject 5 units SQ Q HS, refer for ophthalmology routine eye exam, check for urine microalbumin Lab Results  Component Value Date   HGBA1C 6.8 (H) 04/17/2017    4. Essential hypertension - well-controlled, continue Carvedilol 3.125 mg BID   5. Gastroesophageal  reflux disease without esophagitis - stable, continue Pantoprazole 40 mg daily   6. Slow transit constipation - complained of constipation, will change Lactulose 10 gm/15 ml PRN to 30 ml PO Q D, continue Miralax 17 gm daily and Senna-S 2 tabs BID     Family/ staff Communication:  Discussed plan of care with resident.  Labs/tests ordered:  BMP, CBC, and urine microalbumin  Goals of care:   Short-term rehabilitation.   Durenda Age, NP [facility] [phone] (Monday-Friday 8:00 a.m. - 5:00 p.m.) [phone] (after hours)

## 2017-06-26 LAB — BASIC METABOLIC PANEL
BUN: 15 (ref 4–21)
Creatinine: 0.9 (ref 0.5–1.1)
Glucose: 116
Potassium: 4.1 (ref 3.4–5.3)
Sodium: 136 — AB (ref 137–147)

## 2017-06-26 LAB — CBC AND DIFFERENTIAL
HEMATOCRIT: 35 — AB (ref 36–46)
Hemoglobin: 11.7 — AB (ref 12.0–16.0)
NEUTROS ABS: 3
PLATELETS: 195 (ref 150–399)
WBC: 5

## 2017-06-28 ENCOUNTER — Encounter: Payer: Self-pay | Admitting: Physician Assistant

## 2017-06-28 ENCOUNTER — Encounter (INDEPENDENT_AMBULATORY_CARE_PROVIDER_SITE_OTHER): Payer: Self-pay

## 2017-06-28 ENCOUNTER — Ambulatory Visit (INDEPENDENT_AMBULATORY_CARE_PROVIDER_SITE_OTHER): Payer: Medicare Other | Admitting: Physician Assistant

## 2017-06-28 VITALS — BP 110/70 | HR 78 | Ht 62.0 in | Wt 170.0 lb

## 2017-06-28 DIAGNOSIS — L97509 Non-pressure chronic ulcer of other part of unspecified foot with unspecified severity: Secondary | ICD-10-CM | POA: Diagnosis not present

## 2017-06-28 DIAGNOSIS — I214 Non-ST elevation (NSTEMI) myocardial infarction: Secondary | ICD-10-CM

## 2017-06-28 DIAGNOSIS — I5022 Chronic systolic (congestive) heart failure: Secondary | ICD-10-CM | POA: Diagnosis not present

## 2017-06-28 NOTE — H&P (View-Only) (Signed)
Cardiology Office Note    Date:  06/28/2017   ID:  Chelsea [name], DOB Jul 06, 1928, MRN [id number]  PCP:  Jani Gravel, MD  Cardiologist: Dr. Acie Fredrickson  Chief Complaint: Hospital follow up  History of Present Illness:   Chelsea [name] is a 84 y.o. female chronic osteomyelitis of left feet, HTN, diabetes mellitus, dementia and  venous stasis presents for follow-up.  Admitted 4/6-4/23/2019 with sudden onset of generalized weakness.  She was admitted under internal medicine service.  Cardiology is seeing the patient for non-STEMI with troponin up to 5.87.  EKG without ischemia.  Initial plan was for cath but deferred due to worsening renal function. Echo with EF of 40-45% with hypokinesis in the mid-apicalanteroseptal, anterior and apical myocardium consistent with ischemia in the distribution of the LAD. Held statin due to elevated LFTS. Seen by nephrologist Dr. Hollie Salk and managed her diuretics. Patient was -18.3 L during this hospitalization.  ARB was discontinued.  Patient was seen by Dr. Hollie Salk 06/04/17.  Serum creatinine improved to 1.37.  Recommended pre-and post hydration if required cardiac cath.  Also okay with restarting low-dose losartan.  Patient has completed 3 months of antibiotic 06/14/2017 for chronic osteomyelitis of left feet.  Patient had to Bmet and CBC done at [facility] 06/25/17.Marland Kitchen  Results unable to review.   Here with daughter for follow-up.  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  Denies chest pain, shortness of breath, orthopnea, PND, melena or blood in his stool.  She has chronic left lower extremity edema due to osteomyelitis.  Past Medical History:  Diagnosis Date  . Anemia    takes Ferrous Sulfate daily  . Arthritis   . Basal cell carcinoma of breast 1998  . Bruises easily   . Dizziness   . DM (diabetes mellitus) ([place])    takes Metformin daily  . Eczema   . Emphysema lung ([place])   . GERD (gastroesophageal reflux disease)    takes Protonix daily  .  Glaucoma    Bil  . History of hiatal hernia   . HTN (hypertension)    takes Diovan daily  . Hyperlipidemia   . ITP (idiopathic thrombocytopenic purpura) 2/09  . Nocturia   . Osteoporosis   . Peripheral edema   . Stroke ([place]) 02/03/09   mid brain, diplopia    Past Surgical History:  Procedure Laterality Date  . BREAST LUMPECTOMY WITH RADIOACTIVE SEED LOCALIZATION Right 10/23/2014   Procedure: RIGHT BREAST LUMPECTOMY WITH RADIOACTIVE SEED LOCALIZATION;  Surgeon: Fanny Skates, MD;  Location: [facility] [place];  Service: General;  Laterality: Right;  . CATARACT EXTRACTION Bilateral   . COLONOSCOPY    . ESOPHAGOGASTRODUODENOSCOPY    . MYOMECTOMY N/A 03/14/1995   late 90's per pt  . PARATHYROIDECTOMY  2008  . REFRACTIVE SURGERY Bilateral   . TONSILLECTOMY  as a child    Current Medications: Prior to Admission medications   Medication Sig Start Date End Date Taking? Authorizing Provider  Amino Acids-Protein Hydrolys (FEEDING SUPPLEMENT, PRO-STAT SUGAR FREE 64,) LIQD Take 30 mLs by mouth daily.    [provider]  aspirin 81 MG tablet Take 81 mg by mouth daily after breakfast.     [provider]  carvedilol (COREG) 3.125 MG tablet Take 1 tablet (3.125 mg total) by mouth 2 (two) times daily with a meal. 05/29/17   Eugenie Filler, MD  Cholecalciferol (VITAMIN D3) 2000 UNITS TABS Take 2,000 Units by mouth daily.    [provider]  HYDROcodone-acetaminophen (NORCO/VICODIN) 5-325 MG tablet Take 1 tablet by mouth every 8 (eight) hours as needed for moderate pain. 06/03/17   [provider]  hydrOXYzine (ATARAX/VISTARIL) 10 MG tablet Take 1 tablet (10 mg total) by mouth every 6 (six) hours as needed for itching. 05/29/17   Eugenie Filler, MD  insulin glargine (LANTUS) 100 UNIT/ML injection Inject 5 Units into the skin at bedtime.     [provider]  isosorbide mononitrate (IMDUR) 30 MG 24 hr tablet Take 0.5 tablets (15 mg total) by mouth daily. 05/30/17    Eugenie Filler, MD  lactulose (CHRONULAC) 10 GM/15ML solution Take 30 g by mouth 2 (two) times daily.    [provider]  magnesium oxide (MAG-OX) 400 (241.3 Mg) MG tablet Take 1 tablet (400 mg total) by mouth 2 (two) times daily. 05/29/17   Eugenie Filler, MD  Multiple Vitamins-Minerals (MULTIVITAMIN WITH MINERALS) tablet Take 1 tablet by mouth daily.    [provider]  nitroGLYCERIN (NITROSTAT) 0.4 MG SL tablet Place 1 tablet (0.4 mg total) under the tongue every 5 (five) minutes x 3 doses as needed for chest pain. 05/29/17   Eugenie Filler, MD  Omega-3 Fatty Acids (FISH OIL) 1000 MG CAPS Take 1 capsule by mouth daily at 12 noon.    [provider]  pantoprazole (PROTONIX) 40 MG tablet Take 40 mg by mouth daily after breakfast.     [provider]  polyethylene glycol (MIRALAX / GLYCOLAX) packet Take 17 g by mouth daily.    [provider]  senna-docusate (SENOKOT-S) 8.6-50 MG tablet Take 1 tablet by mouth at bedtime. 05/29/17   Eugenie Filler, MD    Allergies:   Travatan z [travoprost (bak free)]   Social History   Socioeconomic History  . Marital status: Divorced    Spouse name: Not on file  . Number of children: Not on file  . Years of education: Not on file  . Highest education level: Not on file  Occupational History  . Not on file  Social Needs  . Financial resource strain: Not on file  . Food insecurity:    Worry: Not on file    Inability: Not on file  . Transportation needs:    Medical: Not on file    Non-medical: Not on file  Tobacco Use  . Smoking status: Never Smoker  . Smokeless tobacco: Never Used  Substance and Sexual Activity  . Alcohol use: No    Alcohol/week: 0.0 oz  . Drug use: No  . Sexual activity: Not on file  Lifestyle  . Physical activity:    Days per week: Not on file    Minutes per session: Not on file  . Stress: Not on file  Relationships  . Social connections:    Talks on phone: Not  on file    Gets together: Not on file    Attends religious service: Not on file    Active member of club or organization: Not on file    Attends meetings of clubs or organizations: Not on file    Relationship status: Not on file  Other Topics Concern  . Not on file  Social History Narrative   Nonsmoker, nondrinker.       Family History:  The patient's family history includes Breast cancer (age of onset: 61) in her mother; Colon cancer in her father; Diabetes in her father; Heart disease in her maternal grandfather; Multiple sclerosis in her daughter.  ROS:  Please see the history of present illness.    ROS All other systems reviewed and are negative.   PHYSICAL EXAM:   VS:  BP 110/70   Pulse 78   Ht 5\' 2"  (1.575 m)   Wt 170 lb (77.1 kg)   SpO2 94%   BMI 31.09 kg/m    GEN: Well nourished, well developed, in no acute distress  HEENT: normal  Neck: no JVD, carotid bruits, or masses Cardiac: RRR; no murmurs, rubs, or gallops Respiratory:  clear to auscultation bilaterally, normal work of breathing GI: soft, nontender, nondistended, + BS MS: no deformity or atrophy.  Left lower extremity edema with Velcro splint Skin: warm and dry, no rash Neuro:  Alert and Oriented x 3, Strength and sensation are intact Psych: euthymic mood, full affect  Wt Readings from Last 3 Encounters:  06/28/17 170 lb (77.1 kg)  06/25/17 163 lb 12.8 oz (74.3 kg)  06/14/17 168 lb (76.2 kg)      Studies/Labs Reviewed:   EKG:  EKG is not ordered today.    Recent Labs: 05/14/2017: B Natriuretic Peptide 219.0 05/21/2017: ALT 60 05/28/2017: Hemoglobin 9.7; Platelets 288 05/29/2017: Magnesium 1.2 06/26/2017: BUN 15; Creatinine 0.9; Potassium 4.1; Sodium 136   Lipid Panel    Component Value Date/Time   CHOL 113 05/13/2017 0329   TRIG 28 05/13/2017 0329   HDL 52 05/13/2017 0329   CHOLHDL 2.2 05/13/2017 0329   VLDL 6 05/13/2017 0329   LDLCALC 55 05/13/2017 0329    Additional studies/ records that  were reviewed today include:   Echocardiogram: 05/14/17 Study Conclusions  - Left ventricle: The cavity size was normal. Wall thickness was   normal. Systolic function was mildly to moderately reduced. The   estimated ejection fraction was in the range of 40% to 45%.   Hypokinesis of the mid-apicalanteroseptal, anterior, and apical   myocardium; consistent with ischemia in the distribution of the   left anterior descending coronary artery. Doppler parameters are   consistent with abnormal left ventricular relaxation (grade 1   diastolic dysfunction). Doppler parameters are consistent with   elevated mean left atrial filling pressure. - Tricuspid valve: There was moderate regurgitation. - Pulmonary arteries: Systolic pressure was mildly increased. PA   peak pressure: 36 mm Hg (S).    ASSESSMENT & PLAN:    1. Recent non-STEMI Cardiac cath deferred in hospital due to acute on chronic kidney disease.  Now her renal function has improved.  Nephrologist is okay with restarting losartan as well as cardiac cath with pre & post hydration.  Will request bmet and CBC from SNF which was done 2 days ago.  I will review with Dr. Acie Fredrickson regarding invasive versus noninvasive approach for further evaluation.  Patient initially declined cardiac cath however at the end of visit she wants to go ahead.  Continue aspirin, beta-blocker and Imdur.  2.  Chronic systolic heart failure -Euvolemic.  She is auto diuresing.  Continue beta-blocker.  Will add losartan pending bmet.     Medication Adjustments/Labs and Tests Ordered: Current medicines are reviewed at length with the patient today.  Concerns regarding medicines are outlined above.  Medication changes, Labs and Tests ordered today are listed in the Patient Instructions below. Patient Instructions  Medication Instructions:  Your physician recommends that you continue on your current medications as directed. Please refer to the Current Medication list  given to you today.  Labwork: None ordered  Testing/Procedures: None ordered  Follow-Up: Your physician recommends that you schedule  a follow-up appointment in: 2-3 MONTHS WITH DR. Acie Fredrickson   Any Other Special Instructions Will Be Listed Below (If Applicable).     If you need a refill on your cardiac medications before your next appointment, please call your pharmacy.      Jarrett Soho, [place]  06/28/2017 8:47 AM    [facility] [place], [place], [place]  [address] Phone: [phone]; Fax: [phone]

## 2017-06-28 NOTE — Progress Notes (Signed)
Cardiology Office Note    Date:  06/28/2017   ID:  Chelsea [name], DOB 20-Oct-1928, MRN [id number]  PCP:  Jani Gravel, MD  Cardiologist: Dr. Acie Fredrickson  Chief Complaint: Hospital follow up  History of Present Illness:   Chelsea [name] is a 84 y.o. female chronic osteomyelitis of left feet, HTN, diabetes mellitus, dementia and  venous stasis presents for follow-up.  Admitted 4/6-4/23/2019 with sudden onset of generalized weakness.  She was admitted under internal medicine service.  Cardiology is seeing the patient for non-STEMI with troponin up to 5.87.  EKG without ischemia.  Initial plan was for cath but deferred due to worsening renal function. Echo with EF of 40-45% with hypokinesis in the mid-apicalanteroseptal, anterior and apical myocardium consistent with ischemia in the distribution of the LAD. Held statin due to elevated LFTS. Seen by nephrologist Dr. Hollie Salk and managed her diuretics. Patient was -18.3 L during this hospitalization.  ARB was discontinued.  Patient was seen by Dr. Hollie Salk 06/04/17.  Serum creatinine improved to 1.37.  Recommended pre-and post hydration if required cardiac cath.  Also okay with restarting low-dose losartan.  Patient has completed 3 months of antibiotic 06/14/2017 for chronic osteomyelitis of left feet.  Patient had to Bmet and CBC done at [facility] 06/25/17.Marland Kitchen  Results unable to review.   Here with daughter for follow-up.  She works with physical therapy occasionally at [employer].  Denies chest pain, shortness of breath, orthopnea, PND, melena or blood in his stool.  She has chronic left lower extremity edema due to osteomyelitis.  Past Medical History:  Diagnosis Date  . Anemia    takes Ferrous Sulfate daily  . Arthritis   . Basal cell carcinoma of breast 1998  . Bruises easily   . Dizziness   . DM (diabetes mellitus) ([place])    takes Metformin daily  . Eczema   . Emphysema lung ([place])   . GERD (gastroesophageal reflux disease)    takes Protonix daily  .  Glaucoma    Bil  . History of hiatal hernia   . HTN (hypertension)    takes Diovan daily  . Hyperlipidemia   . ITP (idiopathic thrombocytopenic purpura) 2/09  . Nocturia   . Osteoporosis   . Peripheral edema   . Stroke ([place]) 02/03/09   mid brain, diplopia    Past Surgical History:  Procedure Laterality Date  . BREAST LUMPECTOMY WITH RADIOACTIVE SEED LOCALIZATION Right 10/23/2014   Procedure: RIGHT BREAST LUMPECTOMY WITH RADIOACTIVE SEED LOCALIZATION;  Surgeon: Fanny Skates, MD;  Location: [facility];  Service: General;  Laterality: Right;  . CATARACT EXTRACTION Bilateral   . COLONOSCOPY    . ESOPHAGOGASTRODUODENOSCOPY    . MYOMECTOMY N/A 03/14/1995   late 90's per pt  . PARATHYROIDECTOMY  2008  . REFRACTIVE SURGERY Bilateral   . TONSILLECTOMY  as a child    Current Medications: Prior to Admission medications   Medication Sig Start Date End Date Taking? Authorizing Provider  Amino Acids-Protein Hydrolys (FEEDING SUPPLEMENT, PRO-STAT SUGAR FREE 64,) LIQD Take 30 mLs by mouth daily.    [provider]  aspirin 81 MG tablet Take 81 mg by mouth daily after breakfast.     [provider]  carvedilol (COREG) 3.125 MG tablet Take 1 tablet (3.125 mg total) by mouth 2 (two) times daily with a meal. 05/29/17   Eugenie Filler, MD  Cholecalciferol (VITAMIN D3) 2000 UNITS TABS Take 2,000 Units by mouth daily.    [provider]  HYDROcodone-acetaminophen (NORCO/VICODIN) 5-325 MG tablet Take 1 tablet by mouth every 8 (eight) hours as needed for moderate pain. 06/03/17   [provider]  hydrOXYzine (ATARAX/VISTARIL) 10 MG tablet Take 1 tablet (10 mg total) by mouth every 6 (six) hours as needed for itching. 05/29/17   Eugenie Filler, MD  insulin glargine (LANTUS) 100 UNIT/ML injection Inject 5 Units into the skin at bedtime.     [provider]  isosorbide mononitrate (IMDUR) 30 MG 24 hr tablet Take 0.5 tablets (15 mg total) by mouth daily. 05/30/17    Eugenie Filler, MD  lactulose (CHRONULAC) 10 GM/15ML solution Take 30 g by mouth 2 (two) times daily.    [provider]  magnesium oxide (MAG-OX) 400 (241.3 Mg) MG tablet Take 1 tablet (400 mg total) by mouth 2 (two) times daily. 05/29/17   Eugenie Filler, MD  Multiple Vitamins-Minerals (MULTIVITAMIN WITH MINERALS) tablet Take 1 tablet by mouth daily.    [provider]  nitroGLYCERIN (NITROSTAT) 0.4 MG SL tablet Place 1 tablet (0.4 mg total) under the tongue every 5 (five) minutes x 3 doses as needed for chest pain. 05/29/17   Eugenie Filler, MD  Omega-3 Fatty Acids (FISH OIL) 1000 MG CAPS Take 1 capsule by mouth daily at 12 noon.    [provider]  pantoprazole (PROTONIX) 40 MG tablet Take 40 mg by mouth daily after breakfast.     [provider]  polyethylene glycol (MIRALAX / GLYCOLAX) packet Take 17 g by mouth daily.    [provider]  senna-docusate (SENOKOT-S) 8.6-50 MG tablet Take 1 tablet by mouth at bedtime. 05/29/17   Eugenie Filler, MD    Allergies:   Travatan z [travoprost (bak free)]   Social History   Socioeconomic History  . Marital status: Divorced    Spouse name: Not on file  . Number of children: Not on file  . Years of education: Not on file  . Highest education level: Not on file  Occupational History  . Not on file  Social Needs  . Financial resource strain: Not on file  . Food insecurity:    Worry: Not on file    Inability: Not on file  . Transportation needs:    Medical: Not on file    Non-medical: Not on file  Tobacco Use  . Smoking status: Never Smoker  . Smokeless tobacco: Never Used  Substance and Sexual Activity  . Alcohol use: No    Alcohol/week: 0.0 oz  . Drug use: No  . Sexual activity: Not on file  Lifestyle  . Physical activity:    Days per week: Not on file    Minutes per session: Not on file  . Stress: Not on file  Relationships  . Social connections:    Talks on phone: Not  on file    Gets together: Not on file    Attends religious service: Not on file    Active member of club or organization: Not on file    Attends meetings of clubs or organizations: Not on file    Relationship status: Not on file  Other Topics Concern  . Not on file  Social History Narrative   Nonsmoker, nondrinker.       Family History:  The patient's family history includes Breast cancer (age of onset: 49) in her mother; Colon cancer in her father; Diabetes in her father; Heart disease in her maternal grandfather; Multiple sclerosis in her daughter.  ROS:  Please see the history of present illness.    ROS All other systems reviewed and are negative.   PHYSICAL EXAM:   VS:  BP 110/70   Pulse 78   Ht 5\' 2"  (1.575 m)   Wt 170 lb (77.1 kg)   SpO2 94%   BMI 31.09 kg/m    GEN: Well nourished, well developed, in no acute distress  HEENT: normal  Neck: no JVD, carotid bruits, or masses Cardiac: RRR; no murmurs, rubs, or gallops Respiratory:  clear to auscultation bilaterally, normal work of breathing GI: soft, nontender, nondistended, + BS MS: no deformity or atrophy.  Left lower extremity edema with Velcro splint Skin: warm and dry, no rash Neuro:  Alert and Oriented x 3, Strength and sensation are intact Psych: euthymic mood, full affect  Wt Readings from Last 3 Encounters:  06/28/17 170 lb (77.1 kg)  06/25/17 163 lb 12.8 oz (74.3 kg)  06/14/17 168 lb (76.2 kg)      Studies/Labs Reviewed:   EKG:  EKG is not ordered today.    Recent Labs: 05/14/2017: B Natriuretic Peptide 219.0 05/21/2017: ALT 60 05/28/2017: Hemoglobin 9.7; Platelets 288 05/29/2017: Magnesium 1.2 06/26/2017: BUN 15; Creatinine 0.9; Potassium 4.1; Sodium 136   Lipid Panel    Component Value Date/Time   CHOL 113 05/13/2017 0329   TRIG 28 05/13/2017 0329   HDL 52 05/13/2017 0329   CHOLHDL 2.2 05/13/2017 0329   VLDL 6 05/13/2017 0329   LDLCALC 55 05/13/2017 0329    Additional studies/ records that  were reviewed today include:   Echocardiogram: 05/14/17 Study Conclusions  - Left ventricle: The cavity size was normal. Wall thickness was   normal. Systolic function was mildly to moderately reduced. The   estimated ejection fraction was in the range of 40% to 45%.   Hypokinesis of the mid-apicalanteroseptal, anterior, and apical   myocardium; consistent with ischemia in the distribution of the   left anterior descending coronary artery. Doppler parameters are   consistent with abnormal left ventricular relaxation (grade 1   diastolic dysfunction). Doppler parameters are consistent with   elevated mean left atrial filling pressure. - Tricuspid valve: There was moderate regurgitation. - Pulmonary arteries: Systolic pressure was mildly increased. PA   peak pressure: 36 mm Hg (S).    ASSESSMENT & PLAN:    1. Recent non-STEMI Cardiac cath deferred in hospital due to acute on chronic kidney disease.  Now her renal function has improved.  Nephrologist is okay with restarting losartan as well as cardiac cath with pre & post hydration.  Will request bmet and CBC from SNF which was done 2 days ago.  I will review with Dr. Acie Fredrickson regarding invasive versus noninvasive approach for further evaluation.  Patient initially declined cardiac cath however at the end of visit she wants to go ahead.  Continue aspirin, beta-blocker and Imdur.  2.  Chronic systolic heart failure -Euvolemic.  She is auto diuresing.  Continue beta-blocker.  Will add losartan pending bmet.     Medication Adjustments/Labs and Tests Ordered: Current medicines are reviewed at length with the patient today.  Concerns regarding medicines are outlined above.  Medication changes, Labs and Tests ordered today are listed in the Patient Instructions below. Patient Instructions  Medication Instructions:  Your physician recommends that you continue on your current medications as directed. Please refer to the Current Medication list  given to you today.  Labwork: None ordered  Testing/Procedures: None ordered  Follow-Up: Your physician recommends that you schedule  a follow-up appointment in: 2-3 MONTHS WITH DR. Acie Fredrickson   Any Other Special Instructions Will Be Listed Below (If Applicable).     If you need a refill on your cardiac medications before your next appointment, please call your pharmacy.      Jarrett Soho, [place]  06/28/2017 8:47 AM    [facility] [place], [place], [place]  [address] Phone: [phone]; Fax: [phone]

## 2017-06-28 NOTE — Patient Instructions (Addendum)
Medication Instructions:  Your physician recommends that you continue on your current medications as directed. Please refer to the Current Medication list given to you today.  Labwork: None ordered  Testing/Procedures: None ordered  Follow-Up: Your physician recommends that you schedule a follow-up appointment in: 2-3 MONTHS WITH DR. Acie Fredrickson   Any Other Special Instructions Will Be Listed Below (If Applicable).     If you need a refill on your cardiac medications before your next appointment, please call your pharmacy.

## 2017-07-03 ENCOUNTER — Telehealth: Payer: Self-pay | Admitting: Physician Assistant

## 2017-07-03 ENCOUNTER — Telehealth: Payer: Self-pay | Admitting: *Deleted

## 2017-07-03 NOTE — Telephone Encounter (Signed)
Called pt to discuss Cath Instructions. Left message for her to call back.

## 2017-07-03 NOTE — Telephone Encounter (Signed)
I have personally spoke with the patient and went over cardiac cath procedure, hydration recommendations as below and risk factors. The patient understands that risks include but are not limited to stroke (1 in 1000), death (1 in 18), kidney failure [usually temporary] (1 in 500), bleeding (1 in 200), allergic reaction [possibly serious] (1 in 200), and agrees to proceed.   Please set up for LHC of NSTEMI and chronic systolic CHF. She had normal lab work on 06/29/17. She will drink plenty of water overnight at home (~2-3L). She will arrive at short stay 4-5 hours prior to cath for IV NS. She might need to stay overnight for IV hydration post cath (depening on cath MD recommendations).  Hold her insulin overnight.   Patient requests to discuss time and date of cardiac cath with daughter [name] [phone]. Please let me know once it set up so I'll finish up with orders.

## 2017-07-03 NOTE — Telephone Encounter (Signed)
Spoke with daughter, Cecille Rubin, [place] on file. She states that anytime next week would be fine for a heart cath.  I have it scheduled for Monday, 6/3/1*9, with Dr. Irish Lack. Pt will arrive at 7:00 a.m for hydration.  I will call and discuss all instructions with Cecille Rubin.  See instructions below:    @LOGO @  [place] OFFICE [address], [place] [address] Dept: [phone] Loc: [place]  07/03/2017  You are scheduled for a Cardiac Catheterization on Monday, June 3 with Dr. Larae Grooms.  1. Please arrive at the [address] [facility] (Main Entrance A) at [facility]: [place], [place] [address] at 7:00 AM (two hours before your procedure to ensure your preparation). Free valet parking service is available.   Special note: Every effort is made to have your procedure done on time. Please understand that emergencies sometimes delay scheduled procedures.  2. Diet: Do not eat or drink anything after midnight prior to your procedure except sips of water to take medications.  3. Labs: None needed.  4. Medication instructions in preparation for your procedure:  On the morning of your procedure, take your Aspirin and any morning medicines NOT listed above.  You may use sips of water.  5. Plan for one night stay--bring personal belongings. 6. Bring a current list of your medications and current insurance cards. 7. You MUST have a responsible person to drive you home. 8. Someone MUST be with you the first 24 hours after you arrive home or your discharge will be delayed. 9. Please wear clothes that are easy to get on and off and wear slip-on shoes.  Thank you for allowing [place] to care for you!   -- [facility]

## 2017-07-03 NOTE — Telephone Encounter (Signed)
-----   Message from Thayer Headings, MD sent at 07/03/2017 12:32 PM EDT ----- Looks like her creatinine has significantly improved. I think we should cath her.   Hold off on starting Losartan for now. We couild admit the night before or have her hydrate at home by doing the following : I think we can have her hydrate at home by drinking lots of water. Have her arrive at short stay 4-5 hours prior to cath and start IV NS for several hours prior to cath.    I  Do we know what caused her acute renal insufficiency - if it looks like it was caused by Losartan, we should let nephrology evaluate her before we consider starting it back  Thanks  Phil   ----- Message ----- From: Leanor Kail, PA Sent: 07/03/2017  10:11 AM To: Thayer Headings, MD, Emmaline Life, RN  Patient cath was deferred previously due to [facility]. Now renal function has been normalized. Nephrologist is ok with restarting losartan and cath (recommended pre and post hydration). She was doing well when I saw her in clinic. Do u wants to proceed with cath with overnight hydration vs add losatan and follow up with you?   Thanks   Vin     ----- Message ----- From: Elberta Fortis Sent: 06/29/2017   9:01 AM To: Leanor Kail, [place]

## 2017-07-03 NOTE — Telephone Encounter (Signed)
Addended to note above: Somehow, the Insulin instructions was not added to the instructions for the heart cath    Take only 2 units of insulin the night before your procedure. Do not take any insulin on the day of the procedure.   Have verbally went over instructions with Nurse @ [facility], [place].

## 2017-07-04 NOTE — Telephone Encounter (Signed)
Tried to reach pt re: cath instructions.  Left a message for her to cal back.

## 2017-07-05 NOTE — Telephone Encounter (Signed)
Outreach made to Pt-  No answer.  Call placed to daughter per DPR. Daughter does not have any questions regarding procedure.  Daughter able to verbalize all instruction correctly. Daughter unsure what Pt question would be.   Left generic message for Pt requesting call back if she still has questions regarding catheterization.

## 2017-07-09 ENCOUNTER — Encounter (HOSPITAL_COMMUNITY)
Admission: RE | Disposition: A | Discharge status: Home / Self Care | Payer: Self-pay | Source: Ambulatory Visit | Attending: Interventional Cardiology

## 2017-07-09 ENCOUNTER — Ambulatory Visit (HOSPITAL_COMMUNITY)
Admission: RE | Admit: 2017-07-09 | Discharge: 2017-07-09 | Disposition: A | Discharge status: Home / Self Care | Payer: Medicare Other | Source: Ambulatory Visit | Attending: Interventional Cardiology | Admitting: Interventional Cardiology

## 2017-07-09 DIAGNOSIS — Z9841 Cataract extraction status, right eye: Secondary | ICD-10-CM | POA: Insufficient documentation

## 2017-07-09 DIAGNOSIS — Z9889 Other specified postprocedural states: Secondary | ICD-10-CM | POA: Insufficient documentation

## 2017-07-09 DIAGNOSIS — Z888 Allergy status to other drugs, medicaments and biological substances status: Secondary | ICD-10-CM | POA: Insufficient documentation

## 2017-07-09 DIAGNOSIS — N189 Chronic kidney disease, unspecified: Secondary | ICD-10-CM | POA: Insufficient documentation

## 2017-07-09 DIAGNOSIS — Z803 Family history of malignant neoplasm of breast: Secondary | ICD-10-CM | POA: Insufficient documentation

## 2017-07-09 DIAGNOSIS — Z833 Family history of diabetes mellitus: Secondary | ICD-10-CM | POA: Insufficient documentation

## 2017-07-09 DIAGNOSIS — Z8249 Family history of ischemic heart disease and other diseases of the circulatory system: Secondary | ICD-10-CM | POA: Diagnosis not present

## 2017-07-09 DIAGNOSIS — I130 Hypertensive heart and chronic kidney disease with heart failure and stage 1 through stage 4 chronic kidney disease, or unspecified chronic kidney disease: Secondary | ICD-10-CM | POA: Diagnosis not present

## 2017-07-09 DIAGNOSIS — E890 Postprocedural hypothyroidism: Secondary | ICD-10-CM | POA: Insufficient documentation

## 2017-07-09 DIAGNOSIS — Z7982 Long term (current) use of aspirin: Secondary | ICD-10-CM | POA: Insufficient documentation

## 2017-07-09 DIAGNOSIS — E1122 Type 2 diabetes mellitus with diabetic chronic kidney disease: Secondary | ICD-10-CM | POA: Diagnosis not present

## 2017-07-09 DIAGNOSIS — I2510 Atherosclerotic heart disease of native coronary artery without angina pectoris: Secondary | ICD-10-CM | POA: Insufficient documentation

## 2017-07-09 DIAGNOSIS — Z794 Long term (current) use of insulin: Secondary | ICD-10-CM | POA: Insufficient documentation

## 2017-07-09 DIAGNOSIS — M1990 Unspecified osteoarthritis, unspecified site: Secondary | ICD-10-CM | POA: Insufficient documentation

## 2017-07-09 DIAGNOSIS — H409 Unspecified glaucoma: Secondary | ICD-10-CM | POA: Insufficient documentation

## 2017-07-09 DIAGNOSIS — Z79899 Other long term (current) drug therapy: Secondary | ICD-10-CM | POA: Insufficient documentation

## 2017-07-09 DIAGNOSIS — I214 Non-ST elevation (NSTEMI) myocardial infarction: Secondary | ICD-10-CM | POA: Diagnosis not present

## 2017-07-09 DIAGNOSIS — Z9842 Cataract extraction status, left eye: Secondary | ICD-10-CM | POA: Insufficient documentation

## 2017-07-09 DIAGNOSIS — I5022 Chronic systolic (congestive) heart failure: Secondary | ICD-10-CM | POA: Diagnosis not present

## 2017-07-09 DIAGNOSIS — Z85828 Personal history of other malignant neoplasm of skin: Secondary | ICD-10-CM | POA: Insufficient documentation

## 2017-07-09 DIAGNOSIS — Z800 Family history of malignant neoplasm of digestive organs: Secondary | ICD-10-CM | POA: Insufficient documentation

## 2017-07-09 DIAGNOSIS — Z8673 Personal history of transient ischemic attack (TIA), and cerebral infarction without residual deficits: Secondary | ICD-10-CM | POA: Insufficient documentation

## 2017-07-09 DIAGNOSIS — E1169 Type 2 diabetes mellitus with other specified complication: Secondary | ICD-10-CM | POA: Insufficient documentation

## 2017-07-09 DIAGNOSIS — E785 Hyperlipidemia, unspecified: Secondary | ICD-10-CM | POA: Insufficient documentation

## 2017-07-09 DIAGNOSIS — K219 Gastro-esophageal reflux disease without esophagitis: Secondary | ICD-10-CM | POA: Insufficient documentation

## 2017-07-09 HISTORY — PX: LEFT HEART CATH AND CORONARY ANGIOGRAPHY: CATH118249

## 2017-07-09 LAB — BASIC METABOLIC PANEL
ANION GAP: 7 (ref 5–15)
BUN: 13 mg/dL (ref 6–20)
CALCIUM: 9.2 mg/dL (ref 8.9–10.3)
CO2: 26 mmol/L (ref 22–32)
Chloride: 106 mmol/L (ref 101–111)
Creatinine, Ser: 1 mg/dL (ref 0.44–1.00)
GFR calc non Af Amer: 50 mL/min — ABNORMAL LOW (ref >60)
GFR, EST AFRICAN AMERICAN: 57 mL/min — AB (ref >60)
GLUCOSE: 125 mg/dL — AB (ref 65–99)
POTASSIUM: 4 mmol/L (ref 3.5–5.1)
Sodium: 139 mmol/L (ref 135–145)

## 2017-07-09 LAB — GLUCOSE, CAPILLARY
Glucose-Capillary: 136 mg/dL — ABNORMAL HIGH (ref 65–99)
Glucose-Capillary: 122 mg/dL — ABNORMAL HIGH (ref 65–99)

## 2017-07-09 SURGERY — LEFT HEART CATH AND CORONARY ANGIOGRAPHY
Anesthesia: LOCAL

## 2017-07-09 MED ORDER — IOPAMIDOL (ISOVUE-370) INJECTION 76%
INTRAVENOUS | Status: AC
  Filled 2017-07-09: qty 100

## 2017-07-09 MED ORDER — IOPAMIDOL (ISOVUE-370) INJECTION 76%
INTRAVENOUS | Status: DC | PRN
  Administered 2017-07-09: 55 mL

## 2017-07-09 MED ORDER — VERAPAMIL HCL 2.5 MG/ML IV SOLN
INTRAVENOUS | Status: AC
  Filled 2017-07-09: qty 2

## 2017-07-09 MED ORDER — SODIUM CHLORIDE 0.9 % IV SOLN: 250.0000 mL | INTRAVENOUS | Status: DC | PRN

## 2017-07-09 MED ORDER — SODIUM CHLORIDE 0.9% FLUSH: 3.0000 mL | INTRAVENOUS | Status: DC | PRN

## 2017-07-09 MED ORDER — SODIUM CHLORIDE 0.9% FLUSH: 3.0000 mL | Freq: Two times a day (BID) | INTRAVENOUS | Status: DC

## 2017-07-09 MED ORDER — HEPARIN (PORCINE) IN NACL 1000-0.9 UT/500ML-% IV SOLN
INTRAVENOUS | Status: AC
  Filled 2017-07-09: qty 1000

## 2017-07-09 MED ORDER — SODIUM CHLORIDE 0.9 % WEIGHT BASED INFUSION
3.0000 mL/kg/h | INTRAVENOUS | Status: DC
  Administered 2017-07-09: 3 mL/kg/h via INTRAVENOUS

## 2017-07-09 MED ORDER — LIDOCAINE HCL (PF) 1 % IJ SOLN
INTRAMUSCULAR | Status: DC | PRN
  Administered 2017-07-09: 2 mL

## 2017-07-09 MED ORDER — ASPIRIN 81 MG PO CHEW: 81.0000 mg | CHEWABLE_TABLET | ORAL | Status: DC

## 2017-07-09 MED ORDER — VERAPAMIL HCL 2.5 MG/ML IV SOLN
INTRAVENOUS | Status: DC | PRN
  Administered 2017-07-09: 10 mL via INTRA_ARTERIAL

## 2017-07-09 MED ORDER — SODIUM CHLORIDE 0.9 % WEIGHT BASED INFUSION: 1.0000 mL/kg/h | INTRAVENOUS | Status: DC

## 2017-07-09 MED ORDER — HEPARIN SODIUM (PORCINE) 1000 UNIT/ML IJ SOLN
INTRAMUSCULAR | Status: DC | PRN
  Administered 2017-07-09: 4000 [IU] via INTRAVENOUS

## 2017-07-09 MED ORDER — HEPARIN (PORCINE) IN NACL 2-0.9 UNITS/ML
INTRAMUSCULAR | Status: AC | PRN
  Administered 2017-07-09 (×2): 500 mL

## 2017-07-09 MED ORDER — LIDOCAINE HCL (PF) 1 % IJ SOLN
INTRAMUSCULAR | Status: AC
  Filled 2017-07-09: qty 30

## 2017-07-09 MED ORDER — CARVEDILOL 3.125 MG PO TABS
3.1250 mg | ORAL_TABLET | Freq: Once | ORAL | Status: DC
  Filled 2017-07-09 (×2): qty 1

## 2017-07-09 SURGICAL SUPPLY — 14 items
CATH 5FR JL3.5 JR4 ANG PIG MP (CATHETERS) ×2 IMPLANT
COVER PRB 48X5XTLSCP FOLD TPE (BAG) ×1 IMPLANT
COVER PROBE 5X48 (BAG) ×1
DEVICE RAD COMP TR BAND LRG (VASCULAR PRODUCTS) ×2 IMPLANT
GUIDEWIRE INQWIRE 1.5J.035X260 (WIRE) ×1 IMPLANT
INQWIRE 1.5J .035X260CM (WIRE) ×2
KIT HEART LEFT (KITS) ×2 IMPLANT
NEEDLE PERC 21GX4CM (NEEDLE) ×2 IMPLANT
PACK CARDIAC CATHETERIZATION (CUSTOM PROCEDURE TRAY) ×2 IMPLANT
SHEATH RAIN RADIAL 21G 6FR (SHEATH) ×2 IMPLANT
SYR MEDRAD MARK V 150ML (SYRINGE) ×2 IMPLANT
TRANSDUCER W/STOPCOCK (MISCELLANEOUS) ×2 IMPLANT
TUBING CIL FLEX 10 FLL-RA (TUBING) ×2 IMPLANT
WIRE HI TORQ VERSACORE-J 145CM (WIRE) ×2 IMPLANT

## 2017-07-09 NOTE — Interval H&P Note (Signed)
Cath Lab Visit (complete for each Cath Lab visit)  Clinical Evaluation Leading to the Procedure:   ACS: Yes.    Non-ACS:    Anginal Classification: CCS IV  Anti-ischemic medical therapy: Minimal Therapy (1 class of medications)  Non-Invasive Test Results: No non-invasive testing performed  Prior CABG: No previous CABG   Anterior wall motion abnormality on echo   History and Physical Interval Note:  07/09/2017 12:03 PM  Chelsea [name]  has presented today for surgery, with the diagnosis of n stemi - hf  The various methods of treatment have been discussed with the patient and family. After consideration of risks, benefits and other options for treatment, the patient has consented to  Procedure(s): LEFT HEART CATH AND CORONARY ANGIOGRAPHY (N/A) as a surgical intervention .  The patient's history has been reviewed, patient examined, no change in status, stable for surgery.  I have reviewed the patient's chart and labs.  Questions were answered to the patient's satisfaction.     Larae Grooms

## 2017-07-09 NOTE — Discharge Instructions (Signed)

## 2017-07-10 ENCOUNTER — Encounter (HOSPITAL_COMMUNITY): Payer: Self-pay | Admitting: Interventional Cardiology

## 2017-07-11 MED FILL — Heparin Sod (Porcine)-NaCl IV Soln 1000 Unit/500ML-0.9%: INTRAVENOUS | Qty: 1000 | Status: AC

## 2017-07-16 DIAGNOSIS — L988 Other specified disorders of the skin and subcutaneous tissue: Secondary | ICD-10-CM | POA: Diagnosis not present

## 2017-07-17 ENCOUNTER — Ambulatory Visit (INDEPENDENT_AMBULATORY_CARE_PROVIDER_SITE_OTHER): Payer: Medicare Other | Admitting: Internal Medicine

## 2017-07-17 ENCOUNTER — Encounter: Payer: Self-pay | Admitting: Internal Medicine

## 2017-07-17 DIAGNOSIS — M86672 Other chronic osteomyelitis, left ankle and foot: Secondary | ICD-10-CM | POA: Diagnosis not present

## 2017-07-17 NOTE — Assessment & Plan Note (Signed)
It appears that her left foot osteomyelitis has been cured by a long course of oral antibiotics.  At this point it is important to provide good foot care and watch for any new pressure points that could lead to any new open wounds.  She can follow-up here as needed.

## 2017-07-17 NOTE — Progress Notes (Signed)
[facility] for Infectious Disease  Patient Active Problem List   Diagnosis Date Noted  . Chronic osteomyelitis of left foot ([place]) 04/10/2017    Priority: High  . Pressure injury of skin 05/16/2017  . Acute kidney failure ([place])   . Acute on chronic combined systolic and diastolic CHF (congestive heart failure) ([place])   . NSTEMI (non-ST elevated myocardial infarction) ([place]) 05/13/2017  . HTN (hypertension) 05/13/2017  . Diabetes mellitus ([place]) 04/10/2017  . Venous stasis dermatitis of both lower extremities 04/10/2017  . Neuropathy 04/10/2017  . Breast cancer of upper-inner quadrant of right female breast ([place]) 10/05/2014  . Occult blood positive stool 03/24/2014  . History of ITP 04/14/2011    Patient's Medications  New Prescriptions   No medications on file  Previous Medications   AMINO ACIDS-PROTEIN HYDROLYS (FEEDING SUPPLEMENT, PRO-STAT SUGAR FREE 64,) LIQD    Take 30 mLs by mouth daily.   ASPIRIN 81 MG TABLET    Take 81 mg by mouth daily after breakfast.    BISACODYL (DULCOLAX) 10 MG SUPPOSITORY    Place 10 mg rectally as needed for moderate constipation (If not relieved by MOM).   CARVEDILOL (COREG) 3.125 MG TABLET    Take 1 tablet (3.125 mg total) by mouth 2 (two) times daily with a meal.   CHOLECALCIFEROL (VITAMIN D3) 1000 UNITS CAPS    Take 2,000 Units by mouth daily.    HYDROCODONE-ACETAMINOPHEN (NORCO/VICODIN) 5-325 MG TABLET    Take 1 tablet by mouth every 8 (eight) hours as needed for moderate pain.   HYDROXYZINE (ATARAX/VISTARIL) 10 MG TABLET    Take 10 mg by mouth every 6 (six) hours as needed for itching.   INSULIN GLARGINE (LANTUS) 100 UNIT/ML INJECTION    Inject 5 Units into the skin at bedtime.    ISOSORBIDE MONONITRATE (IMDUR) 30 MG 24 HR TABLET    Take 0.5 tablets (15 mg total) by mouth daily.   LACTULOSE (CHRONULAC) 10 GM/15ML SOLUTION    Take 30 g by mouth See admin instructions. Take 30 g by mouth every day. Take 30 g by mouth twice daily as  needed for constipation   MAGNESIUM HYDROXIDE (MILK OF MAGNESIA) 400 MG/5ML SUSPENSION    Take 30 mLs by mouth daily as needed for mild constipation (If no BM in 3 days).   MAGNESIUM OXIDE (MAG-OX) 400 (241.3 MG) MG TABLET    Take 1 tablet (400 mg total) by mouth 2 (two) times daily.   MULTIPLE VITAMINS-MINERALS (MULTIVITAMIN PO)    Take 1 tablet by mouth daily.   NITROGLYCERIN (NITROSTAT) 0.4 MG SL TABLET    Place 1 tablet (0.4 mg total) under the tongue every 5 (five) minutes x 3 doses as needed for chest pain.   OMEGA-3 FATTY ACIDS (FISH OIL) 1000 MG CAPS    Take 1,000 mg by mouth 2 (two) times daily.    PANTOPRAZOLE (PROTONIX) 40 MG TABLET    Take 40 mg by mouth daily after breakfast.    POLYETHYLENE GLYCOL (MIRALAX / GLYCOLAX) PACKET    Take 17 g by mouth daily.   SENNA-DOCUSATE (SENOKOT-S) 8.6-50 MG TABLET    Take 1 tablet by mouth at bedtime.   SODIUM PHOSPHATES (RA SALINE ENEMA) 19-7 GM/118ML ENEM    Place 1 each rectally as needed (for constipation. If not relieved by Bisacodyl Suppository).  Modified Medications   No medications on file  Discontinued Medications   No medications on file  Subjective: Ms. Gains is in with her daughter for her routine follow-up visit.  She completed 3 months of amoxicillin clavulanate and doxycycline for her left foot osteomyelitis 1 month ago.  She has had no change in her chronic callus on the lateral aspect of her foot adjacent to the fifth metatarsal head. She tells me that she is due to be fitted with a new left foot brace next week.  She says that she had bad diarrhea for 1 week after stopping her antibiotics.  She believes the diarrhea resolved spontaneously.  Review of Systems: Review of Systems  Constitutional: Negative for chills, diaphoresis and fever.  Gastrointestinal: Negative for abdominal pain, diarrhea, nausea and vomiting.  Musculoskeletal: Positive for joint pain.    Past Medical History:  Diagnosis Date  . Anemia    takes  Ferrous Sulfate daily  . Arthritis   . Basal cell carcinoma of breast 1998  . Bruises easily   . Dizziness   . DM (diabetes mellitus) ([place])    takes Metformin daily  . Eczema   . Emphysema lung ([place])   . GERD (gastroesophageal reflux disease)    takes Protonix daily  . Glaucoma    Bil  . History of hiatal hernia   . HTN (hypertension)    takes Diovan daily  . Hyperlipidemia   . ITP (idiopathic thrombocytopenic purpura) 2/09  . Nocturia   . Osteoporosis   . Peripheral edema   . Stroke ([place]) 02/03/09   mid brain, diplopia    Social History   Tobacco Use  . Smoking status: Never Smoker  . Smokeless tobacco: Never Used  Substance Use Topics  . Alcohol use: No    Alcohol/week: 0.0 oz  . Drug use: No    Family History  Problem Relation Age of Onset  . Breast cancer Mother 33  . Colon cancer Father   . Diabetes Father        questionable  . Multiple sclerosis Daughter   . Heart disease Maternal Grandfather     Allergies  Allergen Reactions  . Travatan Z [Travoprost (Bak Free)] Other (See Comments)    dizziness    Objective: Vitals:   07/17/17 1036  BP: 132/85  Pulse: 76  Temp: 97.6 F (36.4 C)  TempSrc: Oral   There is no height or weight on file to calculate BMI.  Physical Exam  Constitutional:  She is alert and pleasant, seated in her wheelchair.  Musculoskeletal:  There is no change in the chronic callus in the lateral aspect of her left foot overlying the left fifth metatarsal head.  There is no open wound, drainage, cellulitis or fluctuance.  It is not tender to palpation.    Lab Results    Problem List Items Addressed This Visit      High   Chronic osteomyelitis of left foot (HCC) (Chronic)    It appears that her left foot osteomyelitis has been cured by a long course of oral antibiotics.  At this point it is important to provide good foot care and watch for any new pressure points that could lead to any new open wounds.  She can follow-up  here as needed.          Michel Bickers, MD [facility] for [facility] [phone] pager   [phone] cell 07/17/2017, 10:56 AM

## 2017-07-23 ENCOUNTER — Non-Acute Institutional Stay (SKILLED_NURSING_FACILITY): Payer: Medicare Other | Admitting: Adult Health

## 2017-07-23 ENCOUNTER — Encounter: Payer: Self-pay | Admitting: Adult Health

## 2017-07-23 DIAGNOSIS — I214 Non-ST elevation (NSTEMI) myocardial infarction: Secondary | ICD-10-CM | POA: Diagnosis not present

## 2017-07-23 DIAGNOSIS — I10 Essential (primary) hypertension: Secondary | ICD-10-CM

## 2017-07-23 DIAGNOSIS — M86672 Other chronic osteomyelitis, left ankle and foot: Secondary | ICD-10-CM | POA: Diagnosis not present

## 2017-07-23 DIAGNOSIS — K5901 Slow transit constipation: Secondary | ICD-10-CM

## 2017-07-23 DIAGNOSIS — Z794 Long term (current) use of insulin: Secondary | ICD-10-CM | POA: Diagnosis not present

## 2017-07-23 DIAGNOSIS — E1159 Type 2 diabetes mellitus with other circulatory complications: Secondary | ICD-10-CM

## 2017-07-23 DIAGNOSIS — I5022 Chronic systolic (congestive) heart failure: Secondary | ICD-10-CM | POA: Diagnosis not present

## 2017-07-23 DIAGNOSIS — L988 Other specified disorders of the skin and subcutaneous tissue: Secondary | ICD-10-CM | POA: Diagnosis not present

## 2017-07-23 NOTE — Progress Notes (Signed)
Location:  [facility] Room Number: 161 Place of Service:  SNF ([phone]) Provider:  Durenda Age, NP  Patient Care Team: Jani Gravel, MD as PCP - General (Internal Medicine) Nahser, Wonda Cheng, MD as PCP - Cardiology (Cardiology) Thressa Sheller, MD as Consulting Physician (Internal Medicine) Fanny Skates, MD as Consulting Physician (General Surgery) Truitt Merle, MD as Consulting Physician (Hematology) Thea Silversmith, MD as Consulting Physician (Radiation Oncology) Mauro Kaufmann, RN as Registered Nurse Rockwell Germany, RN as Registered Nurse Jake Shark, Johny Blamer, NP as Nurse Practitioner (Nurse Practitioner)  Extended Emergency Contact Information Primary Emergency Contact: [facility] Address: [address] [place]          [place], [place] [address] Johnnette Litter of [place] Phone: [phone] Work Phone: [phone] Relation: Daughter Secondary Emergency Contact: Bartholome Bill Address: [place], [place] [address] Johnnette Litter of [place] Phone: [phone] Relation: Grandson  Code Status:  Full Code  Goals of care: Advanced Directive information Advanced Directives 07/09/2017  Does Patient Have a Medical Advance Directive? Yes  Type of Paramedic of [place];Living will  Does patient want to make changes to medical advance directive? No - Patient declined  Copy of [place] in Chart? No - copy requested  Would patient like information on creating a medical advance directive? -     Chief Complaint  Patient presents with  . Medical Management of Chronic Issues    The patient is seen for a routine [facility] visit    HPI:  Pt is an 84 y.o. female seen today for medical management of chronic diseases.  She is a short-term rehabilitation resident of [facility].  She has a PMH of chronic osteomyelitis of her left foot,  hypertension, acute kidney failure, diabetes, and acute on chronic combined systolic/diastolic CHF. She was seen in her room today and complained of constipation. CBGs are well-controlled - 154, 135, 15, 146.    Past Medical History:  Diagnosis Date  . Anemia    takes Ferrous Sulfate daily  . Arthritis   . Basal cell carcinoma of breast 1998  . Bruises easily   . Dizziness   . DM (diabetes mellitus) ([place])    takes Metformin daily  . Eczema   . Emphysema lung ([place])   . GERD (gastroesophageal reflux disease)    takes Protonix daily  . Glaucoma    Bil  . History of hiatal hernia   . HTN (hypertension)    takes Diovan daily  . Hyperlipidemia   . ITP (idiopathic thrombocytopenic purpura) 2/09  . Nocturia   . Osteoporosis   . Peripheral edema   . Stroke ([place]) 02/03/09   mid brain, diplopia   Past Surgical History:  Procedure Laterality Date  . BREAST LUMPECTOMY WITH RADIOACTIVE SEED LOCALIZATION Right 10/23/2014   Procedure: RIGHT BREAST LUMPECTOMY WITH RADIOACTIVE SEED LOCALIZATION;  Surgeon: Fanny Skates, MD;  Location: [facility];  Service: General;  Laterality: Right;  . CATARACT EXTRACTION Bilateral   . COLONOSCOPY    . ESOPHAGOGASTRODUODENOSCOPY    . LEFT HEART CATH AND CORONARY ANGIOGRAPHY N/A 07/09/2017   Procedure: LEFT HEART CATH AND CORONARY ANGIOGRAPHY;  Surgeon: Jettie Booze, MD;  Location: [facility] CV [facility];  Service: Cardiovascular;  Laterality: N/A;  . MYOMECTOMY N/A 03/14/1995   late 90's per pt  . PARATHYROIDECTOMY  2008  . REFRACTIVE SURGERY Bilateral   . TONSILLECTOMY  as a child    Allergies  Allergen Reactions  . Travatan Z [Travoprost (Bak Free)] Other (See Comments)    dizziness    Outpatient Encounter Medications as of 07/23/2017  Medication Sig  . Amino Acids-Protein Hydrolys (FEEDING SUPPLEMENT, PRO-STAT SUGAR FREE 64,) LIQD Take 30 mLs by mouth daily.  Marland Kitchen aspirin 81 MG tablet Take 81 mg by mouth daily after breakfast.   . bisacodyl  (DULCOLAX) 10 MG suppository Place 10 mg rectally as needed for moderate constipation (If not relieved by MOM).  . carvedilol (COREG) 3.125 MG tablet Take 1 tablet (3.125 mg total) by mouth 2 (two) times daily with a meal.  . Cholecalciferol (VITAMIN D3) 1000 units CAPS Take 2,000 Units by mouth daily.   Marland Kitchen geriatric multivitamins-minerals (ELDERTONIC/GEVRABON) LIQD Take 15 mLs by mouth 2 (two) times daily.  Marland Kitchen HYDROcodone-acetaminophen (NORCO/VICODIN) 5-325 MG tablet Take 1 tablet by mouth every 8 (eight) hours as needed for moderate pain.  . hydrOXYzine (ATARAX/VISTARIL) 10 MG tablet Take 10 mg by mouth every 6 (six) hours as needed for itching.  . insulin glargine (LANTUS) 100 UNIT/ML injection Inject 5 Units into the skin at bedtime.   . isosorbide mononitrate (IMDUR) 30 MG 24 hr tablet Take 0.5 tablets (15 mg total) by mouth daily.  Marland Kitchen lactulose (CHRONULAC) 10 GM/15ML solution Take 30 g by mouth See admin instructions. Take 30 g by mouth every day. Take 30 g by mouth twice daily as needed for constipation  . magnesium hydroxide (MILK OF MAGNESIA) 400 MG/5ML suspension Take 30 mLs by mouth daily as needed for mild constipation (If no BM in 3 days).  . magnesium oxide (MAG-OX) 400 (241.3 Mg) MG tablet Take 1 tablet (400 mg total) by mouth 2 (two) times daily.  . Multiple Vitamins-Minerals (MULTIVITAMIN PO) Take 1 tablet by mouth daily.  . nitroGLYCERIN (NITROSTAT) 0.4 MG SL tablet Place 1 tablet (0.4 mg total) under the tongue every 5 (five) minutes x 3 doses as needed for chest pain.  . Omega-3 Fatty Acids (FISH OIL) 1000 MG CAPS Take 1,000 mg by mouth 2 (two) times daily.   . pantoprazole (PROTONIX) 40 MG tablet Take 40 mg by mouth daily after breakfast.   . polyethylene glycol (MIRALAX / GLYCOLAX) packet Take 17 g by mouth daily.  . sennosides-docusate sodium (SENOKOT-S) 8.6-50 MG tablet Take 2 tablets by mouth 2 (two) times daily.  . Sodium Phosphates (RA SALINE ENEMA) 19-7 GM/118ML ENEM Place 1  each rectally as needed (for constipation. If not relieved by Bisacodyl Suppository).  . [DISCONTINUED] senna-docusate (SENOKOT-S) 8.6-50 MG tablet Take 1 tablet by mouth at bedtime.   No facility-administered encounter medications on file as of 07/23/2017.     Review of Systems  GENERAL: No fever, chills or weakness MOUTH and THROAT: Denies oral discomfort, gingival pain or bleeding, pain from teeth or hoarseness   RESPIRATORY: no cough, SOB, DOE, wheezing, hemoptysis CARDIAC: No chest pain, edema or palpitations GI: +constipation PSYCHIATRIC: Denies feelings of depression or anxiety. No report of hallucinations, insomnia, paranoia, or agitation    Immunization History  Administered Date(s) Administered  . Influenza Split 01/14/2011  . Influenza Whole 01/27/2010  . Influenza-Unspecified 01/09/2011, 11/06/2016  . Pneumococcal Polysaccharide-23 07/04/2007  . Pneumococcal-Unspecified 06/26/2017   Pertinent  Health Maintenance Due  Topic Date Due  . FOOT EXAM  09/02/1938  . OPHTHALMOLOGY EXAM  09/02/1938  . INFLUENZA VACCINE  09/06/2017  . HEMOGLOBIN A1C  10/18/2017  . PNA vac Low Risk Adult (2 of  2 - PCV13) 06/27/2018  . URINE MICROALBUMIN  06/28/2018  . DEXA SCAN  Completed   Fall Risk  07/17/2017 06/14/2017 05/10/2017 04/19/2017  Falls in the past year? Yes Yes Yes No  Number falls in past yr: 1 - 1 -  Injury with Fall? Yes Yes No -  Comment - - "fall in hallway at home" -  Risk for fall due to : - - Impaired balance/gait -     Vitals:   07/23/17 0909  BP: 138/80  Pulse: 78  Resp: 18  Temp: 98 F (36.7 C)  TempSrc: Oral  SpO2: 95%  Weight: 160 lb (72.6 kg)  Height: 5\' 2"  (1.575 m)   Body mass index is 29.26 kg/m.  Physical Exam  GENERAL APPEARANCE: Well nourished. In no acute distress. Normal body habitus SKIN:  Has dry black scab on posterior left 5th toe.  MOUTH and THROAT: Lips are without lesions. Oral mucosa is moist and without lesions. Tongue is normal  in shape, size, and color and without lesions RESPIRATORY: Breathing is even & unlabored, BS CTAB CARDIAC: RRR, no murmur,no extra heart sounds, no edema GI: Abdomen soft, normal BS, no masses, no tenderness EXTREMITIES:  Able to move X 4 extremities, Left ankle deformity PSYCHIATRIC: Alert and oriented X 3. Affect and behavior are appropriate  Labs reviewed: Recent Labs    05/17/17 0313 05/18/17 0419  05/27/17 0247 05/28/17 0342 05/29/17 0349 06/04/17 06/26/17 07/09/17 1000  NA 133* 132*   < > 136 132* 135 136* 136* 139  K 5.2* 5.6*   < > 4.5 3.9 4.0 4.1 4.1 4.0  CL 104 103   < > 103 100* 103  --   --  106  CO2 18* 18*   < > 19* 19* 20*  --   --  26  GLUCOSE 148* 136*   < > 118* 108* 119*  --   --  125*  BUN 57* 74*   < > 117* 102* 105* 15 15 13   CREATININE 5.78* 6.29*   < > 4.86* 4.17* 3.76* 0.9 0.9 1.00  CALCIUM 7.8* 8.3*   < > 8.6* 8.5* 8.6*  --   --  9.2  MG 1.8 1.7  --   --   --  1.2*  --   --   --   PHOS  --  6.8*   < > 6.8* 5.5* 5.5*  --   --   --    < > = values in this interval not displayed.   Recent Labs    04/17/17 1146 05/17/17 0313  05/21/17 0317  05/27/17 0247 05/28/17 0342 05/29/17 0349  AST 21 144*  --  25  --   --   --   --   ALT 14 127*  --  60*  --   --   --   --   ALKPHOS  --  73  --  56  --   --   --   --   BILITOT 0.5 0.6  --  0.6  --   --   --   --   PROT 6.5 5.0*  --  5.1*  --   --   --   --   ALBUMIN  --  1.9*   < > 1.8*   < > 2.2* 2.2* 2.2*   < > = values in this interval not displayed.   Recent Labs    05/12/17 2326  05/13/17 0944  05/25/17 0222  05/26/17 0312 05/28/17 0342 06/26/17  WBC 9.6  --  11.9*   < > 6.7 8.0 7.7 5.0  NEUTROABS 8.1*  --  9.2*  --   --   --   --  3  HGB 16.3*   < > 12.1   < > 9.5* 9.9* 9.7* 11.7*  HCT 49.5*   < > 37.5   < > 28.5* 28.8* 29.9* 35*  MCV 85.3  --  84.7   < > 83.6 83.5 84.7  --   PLT 214  --  234   < > 298 301 288 195   < > = values in this interval not displayed.   Lab Results  Component Value  Date   TSH 2.057 Test methodology is 3rd generation TSH 02/04/2009   Lab Results  Component Value Date   HGBA1C 6.8 (H) 04/17/2017   Lab Results  Component Value Date   CHOL 113 05/13/2017   HDL 52 05/13/2017   LDLCALC 55 05/13/2017   TRIG 28 05/13/2017   CHOLHDL 2.2 05/13/2017     Assessment/Plan  1. Recent NSTEMI (non-ST elevated myocardial infarction) ([place]) - no complaints of chest pains, continue NTG PRN, ASA 81 mg daily, Isosorbide MN ER 30 mg give 1/2 tab = 15 mg daily   2. Essential hypertension - well-controlled, continue Carvedilol 3.125 mg BID   3. Chronic osteomyelitis of left foot (HCC) - recently finished antibiotic course, monitor for any skin re-opening/infection on  Posterior left 5th toe   4. Chronic systolic heart failure (HCC) - euvolemic, continue Carvedilol 3.125 mg BID   5. Slow transit constipation - will increase Lactulose 10gm/15 ml from 30 ml to 45 ml Q HS and will give an extra dose today, continue Miralax 17 gm daily and Senexon 8.6-50 mg 2 tabs BID   6. Type 2 diabetes mellitus with other circulatory complication, with long-term current use of insulin (HCC) - well-controlled,  Continue Lantus 100 units/ml inject 5 units SQ Q HS, podiatry and ophthalmology consult in-house Lab Results  Component Value Date   HGBA1C 6.8 (H) 04/17/2017      Family/ staff Communication: Discussed plan of care with resident.  Labs/tests ordered:  None  Goals of care:   Long-term care  Durenda Age, NP [facility] and Adult Medicine [phone] (Monday-Friday 8:00 a.m. - 5:00 p.m.) [phone] (after hours)

## 2017-07-30 DIAGNOSIS — M8660 Other chronic osteomyelitis, unspecified site: Secondary | ICD-10-CM | POA: Diagnosis not present

## 2017-07-30 DIAGNOSIS — E1122 Type 2 diabetes mellitus with diabetic chronic kidney disease: Secondary | ICD-10-CM | POA: Diagnosis not present

## 2017-07-30 DIAGNOSIS — I214 Non-ST elevation (NSTEMI) myocardial infarction: Secondary | ICD-10-CM | POA: Diagnosis not present

## 2017-07-30 DIAGNOSIS — I5040 Unspecified combined systolic (congestive) and diastolic (congestive) heart failure: Secondary | ICD-10-CM | POA: Diagnosis not present

## 2017-07-30 DIAGNOSIS — R54 Age-related physical debility: Secondary | ICD-10-CM | POA: Diagnosis not present

## 2017-07-30 DIAGNOSIS — N179 Acute kidney failure, unspecified: Secondary | ICD-10-CM | POA: Diagnosis not present

## 2017-07-30 DIAGNOSIS — I129 Hypertensive chronic kidney disease with stage 1 through stage 4 chronic kidney disease, or unspecified chronic kidney disease: Secondary | ICD-10-CM | POA: Diagnosis not present

## 2017-07-30 DIAGNOSIS — N189 Chronic kidney disease, unspecified: Secondary | ICD-10-CM | POA: Diagnosis not present

## 2017-08-02 DIAGNOSIS — L988 Other specified disorders of the skin and subcutaneous tissue: Secondary | ICD-10-CM | POA: Diagnosis not present

## 2017-08-06 DIAGNOSIS — L988 Other specified disorders of the skin and subcutaneous tissue: Secondary | ICD-10-CM | POA: Diagnosis not present

## 2017-08-13 DIAGNOSIS — L988 Other specified disorders of the skin and subcutaneous tissue: Secondary | ICD-10-CM | POA: Diagnosis not present

## 2017-08-13 LAB — BASIC METABOLIC PANEL
BUN: 22 — AB (ref 4–21)
Creatinine: 0.9 (ref 0.5–1.1)
Glucose: 120
Potassium: 4.1 (ref 3.4–5.3)
Sodium: 141 (ref 137–147)

## 2017-08-20 DIAGNOSIS — L988 Other specified disorders of the skin and subcutaneous tissue: Secondary | ICD-10-CM | POA: Diagnosis not present

## 2017-08-23 ENCOUNTER — Non-Acute Institutional Stay (SKILLED_NURSING_FACILITY): Payer: Medicare Other | Admitting: Adult Health

## 2017-08-23 ENCOUNTER — Encounter: Payer: Self-pay | Admitting: Adult Health

## 2017-08-23 DIAGNOSIS — K219 Gastro-esophageal reflux disease without esophagitis: Secondary | ICD-10-CM

## 2017-08-23 DIAGNOSIS — I10 Essential (primary) hypertension: Secondary | ICD-10-CM

## 2017-08-23 DIAGNOSIS — E1159 Type 2 diabetes mellitus with other circulatory complications: Secondary | ICD-10-CM | POA: Diagnosis not present

## 2017-08-23 DIAGNOSIS — G629 Polyneuropathy, unspecified: Secondary | ICD-10-CM | POA: Diagnosis not present

## 2017-08-23 DIAGNOSIS — I252 Old myocardial infarction: Secondary | ICD-10-CM

## 2017-08-23 DIAGNOSIS — Z794 Long term (current) use of insulin: Secondary | ICD-10-CM

## 2017-08-23 NOTE — Progress Notes (Signed)
Location:  [facility] Room Number: 466 Place of Service:  SNF ([phone]) Provider:  Durenda Age, NP  Patient Care Team: Jani Gravel, MD as PCP - General (Internal Medicine) Nahser, Wonda Cheng, MD as PCP - Cardiology (Cardiology) Thressa Sheller, MD as Consulting Physician (Internal Medicine) Fanny Skates, MD as Consulting Physician (General Surgery) Truitt Merle, MD as Consulting Physician (Hematology) Thea Silversmith, MD as Consulting Physician (Radiation Oncology) Mauro Kaufmann, RN as Registered Nurse Rockwell Germany, RN as Registered Nurse Jake Shark, Johny Blamer, NP as Nurse Practitioner (Nurse Practitioner)  Extended Emergency Contact Information Primary Emergency Contact: [facility] Address: [address] [place]          [place], [place] [address] Johnnette Litter of [place] Phone: [phone] Work Phone: [phone] Relation: Daughter Secondary Emergency Contact: Bartholome Bill Address: [place], [place] [address] Johnnette Litter of [place] Phone: [phone] Relation: Grandson  Code Status:  Full Code  Goals of care: Advanced Directive information Advanced Directives 07/09/2017  Does Patient Have a Medical Advance Directive? Yes  Type of Paramedic of [place];Living will  Does patient want to make changes to medical advance directive? No - Patient declined  Copy of [place] in Chart? No - copy requested  Would patient like information on creating a medical advance directive? -     Chief Complaint  Patient presents with  . Medical Management of Chronic Issues    The patient is seen for a routine [facility] visit    HPI:  Pt is an 84 y.o. female seen today for medical management of chronic diseases.  She is a short-term rehabilitation resident of [facility].  She has a PMH of chronic osteomyelitis of her left foot,  hypertension, acute kidney failure, diabetes, and acute on chronic combined systolic/diastolic heart failure. She was seen in the room today.  She verbalized being on restorative program for exercises. BPs are well-controlled, 126/73, 11/67, 125/65, 142/78.   Past Medical History:  Diagnosis Date  . Anemia    takes Ferrous Sulfate daily  . Arthritis   . Basal cell carcinoma of breast 1998  . Bruises easily   . Dizziness   . DM (diabetes mellitus) ([place])    takes Metformin daily  . Eczema   . Emphysema lung ([place])   . GERD (gastroesophageal reflux disease)    takes Protonix daily  . Glaucoma    Bil  . History of hiatal hernia   . HTN (hypertension)    takes Diovan daily  . Hyperlipidemia   . ITP (idiopathic thrombocytopenic purpura) 2/09  . Nocturia   . Osteoporosis   . Peripheral edema   . Stroke ([place]) 02/03/09   mid brain, diplopia   Past Surgical History:  Procedure Laterality Date  . BREAST LUMPECTOMY WITH RADIOACTIVE SEED LOCALIZATION Right 10/23/2014   Procedure: RIGHT BREAST LUMPECTOMY WITH RADIOACTIVE SEED LOCALIZATION;  Surgeon: Fanny Skates, MD;  Location: [facility];  Service: General;  Laterality: Right;  . CATARACT EXTRACTION Bilateral   . COLONOSCOPY    . ESOPHAGOGASTRODUODENOSCOPY    . LEFT HEART CATH AND CORONARY ANGIOGRAPHY N/A 07/09/2017   Procedure: LEFT HEART CATH AND CORONARY ANGIOGRAPHY;  Surgeon: Jettie Booze, MD;  Location: [facility] CV LAB;  Service: Cardiovascular;  Laterality: N/A;  . MYOMECTOMY N/A 03/14/1995   late 90's per pt  . PARATHYROIDECTOMY  2008  . REFRACTIVE SURGERY Bilateral   .  TONSILLECTOMY  as a child    Allergies  Allergen Reactions  . Travatan Z [Travoprost (Bak Free)] Other (See Comments)    dizziness    Outpatient Encounter Medications as of 08/23/2017  Medication Sig  . Amino Acids-Protein Hydrolys (FEEDING SUPPLEMENT, PRO-STAT SUGAR FREE 64,) LIQD Take 30 mLs by mouth daily.  Marland Kitchen aspirin 81 MG tablet Take 81 mg by mouth  daily after breakfast.   . bisacodyl (DULCOLAX) 10 MG suppository Place 10 mg rectally as needed for moderate constipation (If not relieved by MOM).  . carvedilol (COREG) 3.125 MG tablet Take 1 tablet (3.125 mg total) by mouth 2 (two) times daily with a meal.  . Cholecalciferol (VITAMIN D3) 1000 units CAPS Take 2,000 Units by mouth daily.   Marland Kitchen geriatric multivitamins-minerals (ELDERTONIC/GEVRABON) LIQD Take 15 mLs by mouth 2 (two) times daily.  Marland Kitchen HYDROcodone-acetaminophen (NORCO/VICODIN) 5-325 MG tablet Take 1 tablet by mouth every 8 (eight) hours as needed for moderate pain.  . hydrOXYzine (ATARAX/VISTARIL) 10 MG tablet Take 10 mg by mouth every 6 (six) hours as needed for itching.  . insulin glargine (LANTUS) 100 UNIT/ML injection Inject 5 Units into the skin at bedtime.   . isosorbide mononitrate (IMDUR) 30 MG 24 hr tablet Take 0.5 tablets (15 mg total) by mouth daily.  Marland Kitchen lactulose (CHRONULAC) 10 GM/15ML solution Take 30 g by mouth at bedtime.   Marland Kitchen losartan (COZAAR) 25 MG tablet Take 25 mg by mouth daily.  . magnesium hydroxide (MILK OF MAGNESIA) 400 MG/5ML suspension Take 30 mLs by mouth daily as needed for mild constipation (If no BM in 3 days).  . magnesium oxide (MAG-OX) 400 (241.3 Mg) MG tablet Take 1 tablet (400 mg total) by mouth 2 (two) times daily.  . Multiple Vitamins-Minerals (MULTIVITAMIN PO) Take 1 tablet by mouth daily.  . nitroGLYCERIN (NITROSTAT) 0.4 MG SL tablet Place 1 tablet (0.4 mg total) under the tongue every 5 (five) minutes x 3 doses as needed for chest pain.  . Omega-3 Fatty Acids (FISH OIL) 1000 MG CAPS Take 1,000 mg by mouth 2 (two) times daily.   . pantoprazole (PROTONIX) 40 MG tablet Take 40 mg by mouth daily after breakfast.   . polyethylene glycol (MIRALAX / GLYCOLAX) packet Take 17 g by mouth daily.  . sennosides-docusate sodium (SENOKOT-S) 8.6-50 MG tablet Take 2 tablets by mouth 2 (two) times daily.  . Sodium Phosphates (RA SALINE ENEMA) 19-7 GM/118ML ENEM Place  1 each rectally as needed (for constipation. If not relieved by Bisacodyl Suppository).   No facility-administered encounter medications on file as of 08/23/2017.     Review of Systems  GENERAL: No change in appetite, no fatigue, no weight changes, no fever, chills or weakness MOUTH and THROAT: Denies oral discomfort, gingival pain or bleeding, pain from teeth or hoarseness   RESPIRATORY: no cough, SOB, DOE, wheezing, hemoptysis CARDIAC: No chest pain, edema or palpitations GI: No abdominal pain, diarrhea, constipation, heart burn, nausea or vomiting PSYCHIATRIC: Denies feelings of depression or anxiety. No report of hallucinations, insomnia, paranoia, or agitation   Immunization History  Administered Date(s) Administered  . Influenza Split 01/14/2011  . Influenza Whole 01/27/2010  . Influenza-Unspecified 01/09/2011, 11/06/2016  . Pneumococcal Polysaccharide-23 07/04/2007  . Pneumococcal-Unspecified 06/26/2017   Pertinent  Health Maintenance Due  Topic Date Due  . FOOT EXAM  09/02/1938  . OPHTHALMOLOGY EXAM  09/02/1938  . INFLUENZA VACCINE  09/06/2017  . HEMOGLOBIN A1C  10/18/2017  . PNA vac Low Risk Adult (2 of 2 -  PCV13) 06/27/2018  . DEXA SCAN  Completed   Fall Risk  07/17/2017 06/14/2017 05/10/2017 04/19/2017  Falls in the past year? Yes Yes Yes No  Number falls in past yr: 1 - 1 -  Injury with Fall? Yes Yes No -  Comment - - "fall in hallway at home" -  Risk for fall due to : - - Impaired balance/gait -    Vitals:   08/23/17 1001  BP: 101/62  Pulse: 66  Resp: 18  Temp: 98.2 F (36.8 C)  TempSrc: Oral  SpO2: 95%  Weight: 159 lb 6.4 oz (72.3 kg)  Height: 5\' 2"  (1.575 m)   Body mass index is 29.15 kg/m.  Physical Exam  GENERAL APPEARANCE: Well nourished. In no acute distress. Normal body habitus SKIN:  Skin is warm and dry.  MOUTH and THROAT: Lips are without lesions. Oral mucosa is moist and without lesions. Tongue is normal in shape, size, and color and without  lesions RESPIRATORY: Breathing is even & unlabored, BS CTAB CARDIAC: RRR, no murmur,no extra heart sounds, no edema GI: Abdomen soft, normal BS, no masses, no tenderness EXTREMITIES:  Able to move X 4 extremities, left foot has AFO, LLE weakness PSYCHIATRIC: Alert and oriented X 3. Affect and behavior are appropriate   Labs reviewed: Recent Labs    05/17/17 0313 05/18/17 0419  05/27/17 0247 05/28/17 0342 05/29/17 0349  06/26/17 07/09/17 1000 08/13/17  NA 133* 132*   < > 136 132* 135   < > 136* 139 141  K 5.2* 5.6*   < > 4.5 3.9 4.0   < > 4.1 4.0 4.1  CL 104 103   < > 103 100* 103  --   --  106  --   CO2 18* 18*   < > 19* 19* 20*  --   --  26  --   GLUCOSE 148* 136*   < > 118* 108* 119*  --   --  125*  --   BUN 57* 74*   < > 117* 102* 105*   < > 15 13 22*  CREATININE 5.78* 6.29*   < > 4.86* 4.17* 3.76*   < > 0.9 1.00 0.9  CALCIUM 7.8* 8.3*   < > 8.6* 8.5* 8.6*  --   --  9.2  --   MG 1.8 1.7  --   --   --  1.2*  --   --   --   --   PHOS  --  6.8*   < > 6.8* 5.5* 5.5*  --   --   --   --    < > = values in this interval not displayed.   Recent Labs    04/17/17 1146 05/17/17 0313  05/21/17 0317  05/27/17 0247 05/28/17 0342 05/29/17 0349  AST 21 144*  --  25  --   --   --   --   ALT 14 127*  --  60*  --   --   --   --   ALKPHOS  --  73  --  56  --   --   --   --   BILITOT 0.5 0.6  --  0.6  --   --   --   --   PROT 6.5 5.0*  --  5.1*  --   --   --   --   ALBUMIN  --  1.9*   < > 1.8*   < > 2.2*  2.2* 2.2*   < > = values in this interval not displayed.   Recent Labs    05/12/17 2326  05/13/17 0944  05/25/17 0222 05/26/17 0312 05/28/17 0342 06/26/17  WBC 9.6  --  11.9*   < > 6.7 8.0 7.7 5.0  NEUTROABS 8.1*  --  9.2*  --   --   --   --  3  HGB 16.3*   < > 12.1   < > 9.5* 9.9* 9.7* 11.7*  HCT 49.5*   < > 37.5   < > 28.5* 28.8* 29.9* 35*  MCV 85.3  --  84.7   < > 83.6 83.5 84.7  --   PLT 214  --  234   < > 298 301 288 195   < > = values in this interval not displayed.    Lab Results  Component Value Date   TSH 2.057 Test methodology is 3rd generation TSH 02/04/2009    Lab Results  Component Value Date   HGBA1C 6.8 (H) 04/17/2017   Lab Results  Component Value Date   CHOL 113 05/13/2017   HDL 52 05/13/2017   LDLCALC 55 05/13/2017   TRIG 28 05/13/2017   CHOLHDL 2.2 05/13/2017    Assessment/Plan  1. History of non-ST elevation myocardial infarction (NSTEMI) - no complaints of chest pains, continue NTG PRN, Isosorbide MN ER 30 mg give 1/2 tab daily, ASA 81 mg daily   2. Neuropathy -  Did not verbalize pain, continue Norco 5-325 mg 1 tab Q 8 hours PRN   3. Type 2 diabetes mellitus with other circulatory complication, with long-term current use of insulin (HCC)  - CBGs  132, 200, 137, 117, well-controlled, continue Lantus 100 units/ml inject 5 units SQ Q HS Lab Results  Component Value Date   HGBA1C 6.8 (H) 04/17/2017     4. Essential hypertension - BPs 126/73, 11/67, 125/65, 142/78, well-controlled, continue Carvedilol 3.125 mg 1 tab BID, Losartan 25 mg daily   5. GERD -  Stable, continue Pantoprazole 40 mg daily    Family/ staff Communication: Discussed plan of care with resident.  Labs/tests ordered:  None  Goals of care:   Short-term rehabilitation.   Durenda Age, NP [facility] [phone] (Monday-Friday 8:00 a.m. - 5:00 p.m.) [phone] (after hours)

## 2017-08-29 DIAGNOSIS — M79672 Pain in left foot: Secondary | ICD-10-CM | POA: Diagnosis not present

## 2017-08-29 DIAGNOSIS — M25572 Pain in left ankle and joints of left foot: Secondary | ICD-10-CM | POA: Diagnosis not present

## 2017-09-03 DIAGNOSIS — L988 Other specified disorders of the skin and subcutaneous tissue: Secondary | ICD-10-CM | POA: Diagnosis not present

## 2017-09-05 DIAGNOSIS — M25572 Pain in left ankle and joints of left foot: Secondary | ICD-10-CM | POA: Diagnosis not present

## 2017-09-10 ENCOUNTER — Ambulatory Visit (INDEPENDENT_AMBULATORY_CARE_PROVIDER_SITE_OTHER): Payer: Medicare Other | Admitting: Internal Medicine

## 2017-09-10 ENCOUNTER — Telehealth: Payer: Self-pay

## 2017-09-10 DIAGNOSIS — M86672 Other chronic osteomyelitis, left ankle and foot: Secondary | ICD-10-CM | POA: Diagnosis not present

## 2017-09-10 NOTE — Telephone Encounter (Signed)
[place] and spoke with patient's nurse to request notes/labs/xray reports indicating any possible infection to left foot. Requested these notes to be faxed to [phone]. Awaiting documents for Dr. Megan Salon to review. S.Ayriana Wix LPN

## 2017-09-10 NOTE — Progress Notes (Signed)
[facility] for Infectious Disease  Patient Active Problem List   Diagnosis Date Noted  . Chronic osteomyelitis of left foot ([place]) 04/10/2017    Priority: High  . Pressure injury of skin 05/16/2017  . Acute kidney failure ([place])   . Acute on chronic combined systolic and diastolic CHF (congestive heart failure) ([place])   . NSTEMI (non-ST elevated myocardial infarction) ([place]) 05/13/2017  . HTN (hypertension) 05/13/2017  . Diabetes mellitus ([place]) 04/10/2017  . Venous stasis dermatitis of both lower extremities 04/10/2017  . Neuropathy 04/10/2017  . Breast cancer of upper-inner quadrant of right female breast ([place]) 10/05/2014  . Occult blood positive stool 03/24/2014  . History of ITP 04/14/2011    Patient's Medications  New Prescriptions   No medications on file  Previous Medications   AMINO ACIDS-PROTEIN HYDROLYS (FEEDING SUPPLEMENT, PRO-STAT SUGAR FREE 64,) LIQD    Take 30 mLs by mouth daily.   AMOXICILLIN-CLAVULANATE (AUGMENTIN) 875-125 MG TABLET    Take 1 tablet by mouth 2 (two) times daily.   ASPIRIN 81 MG TABLET    Take 81 mg by mouth daily after breakfast.    BISACODYL (DULCOLAX) 10 MG SUPPOSITORY    Place 10 mg rectally as needed for moderate constipation (If not relieved by MOM).   CARVEDILOL (COREG) 3.125 MG TABLET    Take 1 tablet (3.125 mg total) by mouth 2 (two) times daily with a meal.   CHOLECALCIFEROL (VITAMIN D3) 1000 UNITS CAPS    Take 2,000 Units by mouth daily.    DOXYCYCLINE (VIBRAMYCIN) 100 MG CAPSULE    Take 100 mg by mouth 2 (two) times daily.   GERIATRIC MULTIVITAMINS-MINERALS (ELDERTONIC/GEVRABON) LIQD    Take 15 mLs by mouth 2 (two) times daily.   HYDROCODONE-ACETAMINOPHEN (NORCO/VICODIN) 5-325 MG TABLET    Take 1 tablet by mouth every 8 (eight) hours as needed for moderate pain.   HYDROXYZINE (ATARAX/VISTARIL) 10 MG TABLET    Take 10 mg by mouth every 6 (six) hours as needed for itching.   INSULIN GLARGINE (LANTUS) 100 UNIT/ML INJECTION     Inject 5 Units into the skin at bedtime.    ISOSORBIDE MONONITRATE (IMDUR) 30 MG 24 HR TABLET    Take 0.5 tablets (15 mg total) by mouth daily.   LACTULOSE (CHRONULAC) 10 GM/15ML SOLUTION    Take 30 g by mouth at bedtime.    LOSARTAN (COZAAR) 25 MG TABLET    Take 25 mg by mouth daily.   MAGNESIUM HYDROXIDE (MILK OF MAGNESIA) 400 MG/5ML SUSPENSION    Take 30 mLs by mouth daily as needed for mild constipation (If no BM in 3 days).   MAGNESIUM OXIDE (MAG-OX) 400 (241.3 MG) MG TABLET    Take 1 tablet (400 mg total) by mouth 2 (two) times daily.   MULTIPLE VITAMINS-MINERALS (MULTIVITAMIN PO)    Take 1 tablet by mouth daily.   NITROGLYCERIN (NITROSTAT) 0.4 MG SL TABLET    Place 1 tablet (0.4 mg total) under the tongue every 5 (five) minutes x 3 doses as needed for chest pain.   OMEGA-3 FATTY ACIDS (FISH OIL) 1000 MG CAPS    Take 1,000 mg by mouth 2 (two) times daily.    PANTOPRAZOLE (PROTONIX) 40 MG TABLET    Take 40 mg by mouth daily after breakfast.    POLYETHYLENE GLYCOL (MIRALAX / GLYCOLAX) PACKET    Take 17 g by mouth daily.   SENNOSIDES-DOCUSATE SODIUM (SENOKOT-S) 8.6-50 MG TABLET  Take 2 tablets by mouth 2 (two) times daily.   SODIUM PHOSPHATES (RA SALINE ENEMA) 19-7 GM/118ML ENEM    Place 1 each rectally as needed (for constipation. If not relieved by Bisacodyl Suppository).  Modified Medications   No medications on file  Discontinued Medications   No medications on file    Subjective: Ms. Delauter is in with her daughter for a follow-up visit.  She completed 3 months of amoxicillin clavulanate and doxycycline for her left foot osteomyelitis on 06/14/2017.  She is referred back to me today because of some concerns about recurrent infection.  However I do not have any clinical notes outlining what those concerns were.  The last note from [facility] center on 08/23/2017 did not make any mention of a concern about her left foot.  She has some handwritten notes and says that a "therapist" told  her that there was concern about infection seen on a plain x-ray done at the nursing home.  I do not have that x-ray report.  She was referred to Dr. Fredonia Highland, orthopedic surgeon, who has referred her to see Dr. Meridee Score next week.  It sounds like he was more concerned about Charcot arthropathy of her left ankle.  She does not recall Dr. Percell Miller saying anything about infection.  She has had no change in her chronic callus on the lateral aspect of her foot adjacent to the fifth metatarsal head.  She has some discomfort around the callus but has not had any drainage.  She has noted no swelling or redness.  Review of Systems: Review of Systems  Constitutional: Negative for chills, diaphoresis and fever.  Gastrointestinal: Negative for abdominal pain, diarrhea, nausea and vomiting.  Musculoskeletal: Positive for joint pain.    Past Medical History:  Diagnosis Date  . Anemia    takes Ferrous Sulfate daily  . Arthritis   . Basal cell carcinoma of breast 1998  . Bruises easily   . Dizziness   . DM (diabetes mellitus) ([place])    takes Metformin daily  . Eczema   . Emphysema lung ([place])   . GERD (gastroesophageal reflux disease)    takes Protonix daily  . Glaucoma    Bil  . History of hiatal hernia   . HTN (hypertension)    takes Diovan daily  . Hyperlipidemia   . ITP (idiopathic thrombocytopenic purpura) 2/09  . Nocturia   . Osteoporosis   . Peripheral edema   . Stroke ([place]) 02/03/09   mid brain, diplopia    Social History   Tobacco Use  . Smoking status: Never Smoker  . Smokeless tobacco: Never Used  Substance Use Topics  . Alcohol use: No    Alcohol/week: 0.0 oz  . Drug use: No    Family History  Problem Relation Age of Onset  . Breast cancer Mother 39  . Colon cancer Father   . Diabetes Father        questionable  . Multiple sclerosis Daughter   . Heart disease Maternal Grandfather     Allergies  Allergen Reactions  . Travatan Z [Travoprost (Bak Free)] Other  (See Comments)    dizziness    Objective: There were no vitals filed for this visit. There is no height or weight on file to calculate BMI.  Physical Exam  Constitutional:  She is alert and pleasant, seated in her wheelchair.  Musculoskeletal:  There is no change in the chronic callus in the lateral aspect of her left foot overlying the  left fifth metatarsal head.  There is no open wound, drainage, cellulitis or fluctuance.      Lab Results    Problem List Items Addressed This Visit      High   Chronic osteomyelitis of left foot (HCC) (Chronic)    I do not find any changes on her exam today to suggest recurrent infection.  However, I would like to review any notes from any of her providers at [facility] home indicating that there is concern for infection and I also need to review any recent x-ray results.  I would request that these documents be faxed to me as soon as possible.  My fax number is [phone].          Michel Bickers, MD [facility] [phone] pager   [phone] cell 09/10/2017, 4:00 PM

## 2017-09-10 NOTE — Assessment & Plan Note (Signed)
I do not find any changes on her exam today to suggest recurrent infection.  However, I would like to review any notes from any of her providers at [facility] home indicating that there is concern for infection and I also need to review any recent x-ray results.  I would request that these documents be faxed to me as soon as possible.  My fax number is [phone].

## 2017-09-13 DIAGNOSIS — L988 Other specified disorders of the skin and subcutaneous tissue: Secondary | ICD-10-CM | POA: Diagnosis not present

## 2017-09-17 ENCOUNTER — Encounter (INDEPENDENT_AMBULATORY_CARE_PROVIDER_SITE_OTHER): Payer: Self-pay | Admitting: Orthopedic Surgery

## 2017-09-17 ENCOUNTER — Ambulatory Visit (INDEPENDENT_AMBULATORY_CARE_PROVIDER_SITE_OTHER): Payer: Medicare Other | Admitting: Orthopedic Surgery

## 2017-09-17 VITALS — Ht 62.0 in | Wt 159.0 lb

## 2017-09-17 DIAGNOSIS — E1142 Type 2 diabetes mellitus with diabetic polyneuropathy: Secondary | ICD-10-CM | POA: Diagnosis not present

## 2017-09-17 DIAGNOSIS — S82892D Other fracture of left lower leg, subsequent encounter for closed fracture with routine healing: Secondary | ICD-10-CM

## 2017-09-17 DIAGNOSIS — E43 Unspecified severe protein-calorie malnutrition: Secondary | ICD-10-CM

## 2017-09-20 ENCOUNTER — Encounter: Payer: Self-pay | Admitting: Internal Medicine

## 2017-09-20 ENCOUNTER — Non-Acute Institutional Stay (SKILLED_NURSING_FACILITY): Payer: Medicare Other | Admitting: Internal Medicine

## 2017-09-20 DIAGNOSIS — N170 Acute kidney failure with tubular necrosis: Secondary | ICD-10-CM

## 2017-09-20 DIAGNOSIS — I214 Non-ST elevation (NSTEMI) myocardial infarction: Secondary | ICD-10-CM | POA: Diagnosis not present

## 2017-09-20 DIAGNOSIS — M86672 Other chronic osteomyelitis, left ankle and foot: Secondary | ICD-10-CM

## 2017-09-20 DIAGNOSIS — E1159 Type 2 diabetes mellitus with other circulatory complications: Secondary | ICD-10-CM

## 2017-09-20 DIAGNOSIS — Z794 Long term (current) use of insulin: Secondary | ICD-10-CM

## 2017-09-20 NOTE — Progress Notes (Signed)
NURSING HOME LOCATION:  [facility] ROOM NUMBER:  [id number]  CODE STATUS:  Full Code  PCP: [name] MD, [facility]   This is a nursing facility follow up of chronic medical diagnoses  Interim medical record and care since last [facility] visit was updated with review of diagnostic studies and change in clinical status since last visit were documented.  HPI: The patient has been a resident of the facility since 4/23 after being hospitalized 4/6-4/23/19 with non-ST elevation myocardial infarction. Cath 07/09/17 revealed nonobstructive CAD(10-25% lesions) with EF 55-65%. Medical therapy recommended. Diagnoses include diabetes, essential hypertension, and chronic heart failure. The patient stated that she had recurrent falls related to instability of the left foot and ankle which necessitated use of a cane.She also has had an ulcer of L foot for which she saw Dr Dellia Nims, Wound Care. The patient was seen in postop follow-up 09/17/17 by Dr Sharol Given, Orthopedics for the left foot wound and ankle injury; tentatively ankle pinning was to be scheduled 8/21 according to the patient. The patient was seen by Dr. Megan Salon, ID on 09/10/17. He found no changes on exam to suggest recurrent infection. He has continued the dual therapy with Augmentin and doxycycline.  At the SNF glucoses have ranged from 95 up to 112. The last A1c on record was 6.8% on 04/17/17. There was question of possible discordance as she had renal insufficiency. The BMET on 7/8 was normal.Creatinine was 0.9.  Review of systems: She has intermittent constipation.She admits to some anxiety about upcoming surgery. She has no active cardiopulmonary symptoms.  Constitutional: No fever, significant weight change, fatigue  Eyes: No redness, discharge, pain, vision change ENT/mouth: No nasal congestion,  purulent discharge, earache, change in hearing, sore throat  Cardiovascular: No chest pain, palpitations, paroxysmal  nocturnal dyspnea, claudication Respiratory: No cough, sputum production, hemoptysis, DOE, significant snoring, apnea   Gastrointestinal: No heartburn, dysphagia, abdominal pain, nausea /vomiting, rectal bleeding, melena Genitourinary: No dysuria, hematuria, pyuria, incontinence, nocturia Musculoskeletal: No joint stiffness, joint swelling, weakness, pain Dermatologic: No rash, pruritus, change in appearance of skin Neurologic: No dizziness, headache, syncope, seizures, numbness, tingling Psychiatric: No significant depression, insomnia, anorexia Endocrine: No change in hair/skin/nails, excessive thirst, excessive hunger, excessive urination  Hematologic/lymphatic: No significant bruising, lymphadenopathy, abnormal bleeding Allergy/immunology: No itchy/watery eyes, significant sneezing, urticaria, angioedema  Physical exam:  Pertinent or positive findings: She has very slight exotropia of the right eye. Grade 1/2 systolic murmurs present at the right base. Abdomen is protuberant. Visible edema of the left lower extremity is present. She has slight stasis changes of the right shin. The left foot is dressed & in a booty. The pedal pulses could not be checked on the left. There is decreased range of motion of the left lower extremity. The right dorsalis pedis pulse is decreased. The posterior tibial pulse on the right is strong.  General appearance: Adequately nourished; no acute distress, increased work of breathing is present.   Lymphatic: No lymphadenopathy about the head, neck, axilla. Eyes: No conjunctival inflammation or lid edema is present. There is no scleral icterus. Ears:  External ear exam shows no significant lesions or deformities.   Nose:  External nasal examination shows no deformity or inflammation. Nasal mucosa are pink and moist without lesions, exudates Oral exam:  Lips and gums are healthy appearing. There is no oropharyngeal erythema or exudate. Neck:  No thyromegaly, masses,  tenderness noted.    Heart:  Normal rate and regular rhythm. S1 and S2  normal without gallop, click, rub .  Lungs: Chest clear to auscultation without wheezes, rhonchi, rales, rubs. Abdomen: Bowel sounds are normal. Abdomen is soft and nontender with no organomegaly, hernias, masses. GU: Deferred  Extremities:  No cyanosis, clubbing Skin: Warm & dry w/o tenting. No visable rash.  See summary under each active problem in the Problem List with associated updated therapeutic plan      .

## 2017-09-20 NOTE — Assessment & Plan Note (Addendum)
Glucoses well controlled, but A1 C needs updating

## 2017-09-20 NOTE — Assessment & Plan Note (Addendum)
09/20/17 she has no anginal symptoms. Cardiology will be notified of her pending orthopedic surgery 09/26/17

## 2017-09-20 NOTE — Assessment & Plan Note (Signed)
Dr. Megan Salon has continued the doxycycline and Augmentin

## 2017-09-20 NOTE — Assessment & Plan Note (Signed)
08/13/17 BMET within normal limits

## 2017-09-21 ENCOUNTER — Ambulatory Visit (INDEPENDENT_AMBULATORY_CARE_PROVIDER_SITE_OTHER): Payer: Self-pay | Admitting: Physician Assistant

## 2017-09-21 DIAGNOSIS — Z79899 Other long term (current) drug therapy: Secondary | ICD-10-CM | POA: Diagnosis not present

## 2017-09-21 DIAGNOSIS — E119 Type 2 diabetes mellitus without complications: Secondary | ICD-10-CM | POA: Diagnosis not present

## 2017-09-21 LAB — HEMOGLOBIN A1C: HEMOGLOBIN A1C: 7.1 — AB (ref 4.0–6.0)

## 2017-09-21 NOTE — Patient Instructions (Signed)
See assessment and plan under each diagnosis in the problem list and acutely for this visit 

## 2017-09-24 ENCOUNTER — Encounter (HOSPITAL_COMMUNITY): Payer: Self-pay | Admitting: *Deleted

## 2017-09-24 ENCOUNTER — Other Ambulatory Visit: Payer: Self-pay

## 2017-09-24 DIAGNOSIS — L988 Other specified disorders of the skin and subcutaneous tissue: Secondary | ICD-10-CM | POA: Diagnosis not present

## 2017-09-24 NOTE — Progress Notes (Addendum)
Office Visit Note   Patient: Chelsea [name]           Date of Birth: [birth year]           MRN: [id number] Visit Date: 09/17/2017              Requested by: Renette Butters, MD [address] [place], [place] [address] PCP: Hendricks Limes, MD  Chief Complaint  Patient presents with  . Left Foot - Wound Check  . Left Ankle - Injury    4/219 fell OOB dislocation of ankle.       HPI: Patient is a 84 year old woman who was seen for a fracture dislocation of her left ankle.  Patient states she fell out of bed in April 2019 she twisted her foot during the fall sustained a dislocation of the ankle.  Patient states she also had a heart attack and kidney failure and spent time hospitalized focused on the heart attack and kidney failure and her ankle dislocation was not addressed.  Patient has a history of type 2 diabetes she is on Lantus daily she is on aspirin she states that her sugars are under good control.  Assessment & Plan: Visit Diagnoses:  1. Closed fracture dislocation of left ankle with routine healing, subsequent encounter     Plan: Discussed with the patient and her daughter over the phone that with her fracture dislocation she is at risk of skin breakdown risk of amputation of the left lower extremity.  Recommended proceeding with a tibial calcaneal fusion.  Discussed that with her diabetes she has a risk of wound healing.  Patient daughter states he understands wished to proceed at this time.  Follow-Up Instructions: Return in about 2 weeks (around 10/01/2017).   Ortho Exam  Patient is alert, oriented, no adenopathy, well-dressed, normal affect, normal respiratory effort. Examination patient has a good dorsalis pedis and posterior tibial pulse.  She has a deformity with fixed fracture dislocation of the ankle this cannot be reduced manually.  Radiographs from [facility] office shows the fracture dislocation with deformity.  Patient has no full-thickness  skin breakdown there are some ischemic skin changes.  Hemoglobin A1c 7.1 with good control with an albumin of 2.2 with severe protein caloric malnutrition.  Imaging: No results found. No images are attached to the encounter.  Labs: Lab Results  Component Value Date   HGBA1C 7.1 (A) 09/21/2017   HGBA1C 6.8 (H) 04/17/2017   HGBA1C 7.2 (H) 10/20/2014   ESRSEDRATE 9 04/17/2017   ESRSEDRATE 11 02/04/2009   CRP 3.4 04/17/2017   CRP 0.8 (H) 02/04/2009   REPTSTATUS 05/18/2017 FINAL 05/12/2017   GRAMSTAIN  03/08/2017    NO WBC SEEN FEW GRAM NEGATIVE RODS FEW GRAM POSITIVE COCCI IN PAIRS RARE GRAM POSITIVE RODS Performed at [facility], [place] [address] [place], [place] [address]    CULT  05/12/2017    NO GROWTH 5 DAYS Performed at [facility] [address] [place], [place] [address]    [place] 03/08/2017   LABORGA ENTEROCOCCUS FAECALIS 03/08/2017     Lab Results  Component Value Date   ALBUMIN 2.2 (L) 05/29/2017   ALBUMIN 2.2 (L) 05/28/2017   ALBUMIN 2.2 (L) 05/27/2017    Body mass index is 29.08 kg/m.  Orders:  No orders of the defined types were placed in this encounter.  No orders of the defined types were placed in this encounter.    Procedures:  No procedures performed  Clinical Data: No additional findings.  ROS:  All other systems negative, except as noted in the HPI. Review of Systems  Objective: Vital Signs: Ht 5\' 2"  (1.575 m)   Wt 159 lb (72.1 kg)   BMI 29.08 kg/m   Specialty Comments:  No specialty comments available.  PMFS History: Patient Active Problem List   Diagnosis Date Noted  . Closed fracture dislocation of left ankle joint   . Pressure injury of skin 05/16/2017  . Acute kidney failure ([place])   . Acute on chronic combined systolic and diastolic CHF (congestive heart failure) ([place])   . NSTEMI (non-ST elevated myocardial infarction) ([place]) 05/13/2017  . HTN (hypertension) 05/13/2017  . Diabetes  mellitus ([place]) 04/10/2017  . Venous stasis dermatitis of both lower extremities 04/10/2017  . Neuropathy 04/10/2017  . Chronic osteomyelitis of left foot ([place]) 04/10/2017  . Breast cancer of upper-inner quadrant of right female breast ([place]) 10/05/2014  . Occult blood positive stool 03/24/2014  . History of ITP 04/14/2011   Past Medical History:  Diagnosis Date  . Anemia    takes Ferrous Sulfate daily  . Arthritis   . Basal cell carcinoma of breast 1998  . Bruises easily   . Dizziness   . DM (diabetes mellitus) ([place])    takes Metformin daily  . Eczema   . Emphysema lung ([place])   . GERD (gastroesophageal reflux disease)    takes Protonix daily  . Glaucoma    Bil  . History of hiatal hernia   . HTN (hypertension)    takes Diovan daily  . Hyperlipidemia   . ITP (idiopathic thrombocytopenic purpura) 2/09  . Myocardial infarction ([place])    N-stemi 2019  . Nocturia   . Osteoporosis   . Peripheral edema   . Stroke ([place]) 02/03/09   mid brain, diplopia    Family History  Problem Relation Age of Onset  . Breast cancer Mother 11  . Colon cancer Father   . Diabetes Father        questionable  . Multiple sclerosis Daughter   . Heart disease Maternal Grandfather     Past Surgical History:  Procedure Laterality Date  . ANKLE FUSION Left 09/26/2017   Procedure: LEFT TIB-CALC FUSION;  Surgeon: Newt Minion, MD;  Location: [facility];  Service: Orthopedics;  Laterality: Left;  . BREAST LUMPECTOMY WITH RADIOACTIVE SEED LOCALIZATION Right 10/23/2014   Procedure: RIGHT BREAST LUMPECTOMY WITH RADIOACTIVE SEED LOCALIZATION;  Surgeon: Fanny Skates, MD;  Location: [facility];  Service: General;  Laterality: Right;  . CATARACT EXTRACTION Bilateral   . COLONOSCOPY    . ESOPHAGOGASTRODUODENOSCOPY    . LEFT HEART CATH AND CORONARY ANGIOGRAPHY N/A 07/09/2017   Procedure: LEFT HEART CATH AND CORONARY ANGIOGRAPHY;  Surgeon: Jettie Booze, MD;  Location: [facility];  Service: Cardiovascular;   Laterality: N/A;  . MYOMECTOMY N/A 03/14/1995   late 90's per pt  . PARATHYROIDECTOMY  2008  . REFRACTIVE SURGERY Bilateral   . TONSILLECTOMY  as a child   Social History   Occupational History  . Not on file  Tobacco Use  . Smoking status: Never Smoker  . Smokeless tobacco: Never Used  Substance and Sexual Activity  . Alcohol use: No    Alcohol/week: 0.0 standard drinks  . Drug use: No  . Sexual activity: Not on file

## 2017-09-24 NOTE — Pre-Procedure Instructions (Signed)
CAMRI MOLLOY  09/24/2017      [id number] - [place], [place] [address] Phone: [phone] Fax: [phone]    Your procedure is scheduled on August 21  Report to [facility] at 0830 A.M.  Call this number if you have problems the morning of surgery:  [phone]   Remember:  Do not eat or drink after midnight.    Take these medicines the morning of surgery with A SIP OF WATER  amoxicillin-clavulanate (AUGMENTIN) carvedilol (COREG) doxycycline (VIBRAMYCIN)  HYDROcodone-acetaminophen (NORCO/VICODIN) if needed for pain isosorbide mononitrate (IMDUR)  pantoprazole (PROTONIX)   Please send a copy of the MAR including the times that the morning medications are given the morning of surgery    7 days prior to surgery STOP taking any Aspirin(unless otherwise instructed by your surgeon), Aleve, Naproxen, Ibuprofen, Motrin, Advil, Goody's, BC's, all herbal medications, fish oil, and all vitamins   WHAT DO I DO ABOUT MY DIABETES MEDICATION?   Marland Kitchen Do not take oral diabetes medicines (pills) the morning of surgery.  . THE NIGHT BEFORE SURGERY, take ____2_______ units of __insulin glargine (LANTUS) _________insulin.       . The day of surgery, do not take other diabetes injectables, including Byetta (exenatide), Bydureon (exenatide ER), Victoza (liraglutide), or Trulicity (dulaglutide).  . If your CBG is greater than 220 mg/dL, you may take  of your sliding scale (correction) dose of insulin.   How to Manage Your Diabetes Before and After Surgery  Why is it important to control my blood sugar before and after surgery? . Improving blood sugar levels before and after surgery helps healing and can limit problems. . A way of improving blood sugar control is eating a healthy diet by: o  Eating less sugar and carbohydrates o  Increasing activity/exercise o  Talking with your doctor about  reaching your blood sugar goals . High blood sugars (greater than 180 mg/dL) can raise your risk of infections and slow your recovery, so you will need to focus on controlling your diabetes during the weeks before surgery. . Make sure that the doctor who takes care of your diabetes knows about your planned surgery including the date and location.  How do I manage my blood sugar before surgery? . Check your blood sugar at least 4 times a day, starting 2 days before surgery, to make sure that the level is not too high or low. o Check your blood sugar the morning of your surgery when you wake up and every 2 hours until you get to the Short Stay unit. . If your blood sugar is less than 70 mg/dL, you will need to treat for low blood sugar: o Do not take insulin. o Treat a low blood sugar (less than 70 mg/dL) with  cup of clear juice (cranberry or apple), 4 glucose tablets, OR glucose gel. o Recheck blood sugar in 15 minutes after treatment (to make sure it is greater than 70 mg/dL). If your blood sugar is not greater than 70 mg/dL on recheck, call [phone] for further instructions. . Report your blood sugar to the short stay nurse when you get to Short Stay.  . If you are admitted to the hospital after surgery: o Your blood sugar will be checked by the staff and you will probably be given insulin after surgery (instead of oral diabetes medicines) to make sure you have good blood sugar levels. o The goal  for blood sugar control after surgery is 80-180 mg/dL.    Do not wear jewelry, make-up or nail polish.  Do not wear lotions, powders, or perfumes, or deodorant.  Do not shave 48 hours prior to surgery.  Men may shave face and neck.  Do not bring valuables to the hospital.  [facility] is not responsible for any belongings or valuables.  Contacts, dentures or bridgework may not be worn into surgery.  Leave your suitcase in the car.  After surgery it may be brought to your room.  For patients  admitted to the hospital, discharge time will be determined by your treatment team.  Patients discharged the day of surgery will not be allowed to drive home.

## 2017-09-24 NOTE — Progress Notes (Signed)
Called [facility] to speak with the nurse for pre-op instructions nurse was not available at this time.  Medication instructions and arrival time faxed over to [facility].  Left call back number for nurse

## 2017-09-24 NOTE — Progress Notes (Signed)
Pt is a resident at [facility]. Called and spoke with Thurmond Butts, LPN for pre-op call. She states pt is alert, oriented and able to speak for herself. Pt had a non-stemi MI this past June. No stents. Thurmond Butts states pt has not c/o any recent chest pain or sob. Pt is a type 2 diabetic. Last A1C was 7.1 on 09/21/17. Ryan states her fasting blood sugar have between 94-125 in the past week. Have faxed pre-op instructions to [facility].

## 2017-09-24 NOTE — Pre-Procedure Instructions (Addendum)
    Chelsea [name]  09/24/2017    Ms. Rogus's procedure is scheduled on September 26, 2017 at 11:00  AM.   Report to [facility] Entrance "A" Admitting Office at 8:30 AM.   Call this number if you have problems the morning of surgery: [phone]   Remember:  Patient is not to eat or drink after midnight Tuesday, 09/25/17.  Take these medicines the morning of surgery with A SIP OF WATER: Carvedilol (Coreg), Isosorbide Mononitrate (Imdur), Pantoprazole (Protonix), Hydrocodone - if needed.  Stop Multivitamins as of today.   Tuesday evening please give patient 1/2 of her regular dose of Lantus (give her 2 units). Please check her blood sugar when she gets up Wednesday AM and every 2 hours until she leaves for the hospital. If blood sugar is 70 or below, treat with 1/2 cup of clear juice (apple or cranberry) and recheck blood sugar 15 minutes after drinking juice. If blood sugar continues to be 70 or below, call the Short Stay department and ask to speak to a nurse.    Do not wear jewelry, make-up or nail polish.  Do not wear lotions, powders, perfumes or deodorant.  Do not shave 48 hours prior to surgery.   Do not bring valuables to the hospital.  [facility] is not responsible for any belongings or valuables.   Call Lilia Pro, RN at [phone] if any questions today or tomorrow (09/25/17).

## 2017-09-26 ENCOUNTER — Encounter (HOSPITAL_COMMUNITY): Payer: Self-pay | Admitting: Surgery

## 2017-09-26 ENCOUNTER — Ambulatory Visit (HOSPITAL_COMMUNITY): Payer: Medicare Other

## 2017-09-26 ENCOUNTER — Ambulatory Visit (HOSPITAL_COMMUNITY)
Admission: RE | Admit: 2017-09-26 | Discharge: 2017-09-26 | Disposition: A | Discharge status: Skilled Nursing Facility | Payer: Medicare Other | Source: Ambulatory Visit | Attending: Orthopedic Surgery | Admitting: Orthopedic Surgery

## 2017-09-26 ENCOUNTER — Other Ambulatory Visit: Payer: Self-pay

## 2017-09-26 ENCOUNTER — Encounter (HOSPITAL_COMMUNITY)
Admission: RE | Disposition: A | Discharge status: Skilled Nursing Facility | Payer: Self-pay | Source: Ambulatory Visit | Attending: Orthopedic Surgery

## 2017-09-26 DIAGNOSIS — I10 Essential (primary) hypertension: Secondary | ICD-10-CM | POA: Diagnosis not present

## 2017-09-26 DIAGNOSIS — K219 Gastro-esophageal reflux disease without esophagitis: Secondary | ICD-10-CM | POA: Diagnosis not present

## 2017-09-26 DIAGNOSIS — Z7401 Bed confinement status: Secondary | ICD-10-CM | POA: Diagnosis not present

## 2017-09-26 DIAGNOSIS — S82892A Other fracture of left lower leg, initial encounter for closed fracture: Secondary | ICD-10-CM | POA: Diagnosis not present

## 2017-09-26 DIAGNOSIS — E119 Type 2 diabetes mellitus without complications: Secondary | ICD-10-CM | POA: Insufficient documentation

## 2017-09-26 DIAGNOSIS — Z794 Long term (current) use of insulin: Secondary | ICD-10-CM | POA: Diagnosis not present

## 2017-09-26 DIAGNOSIS — X58XXXA Exposure to other specified factors, initial encounter: Secondary | ICD-10-CM | POA: Diagnosis not present

## 2017-09-26 DIAGNOSIS — I5043 Acute on chronic combined systolic (congestive) and diastolic (congestive) heart failure: Secondary | ICD-10-CM | POA: Diagnosis not present

## 2017-09-26 DIAGNOSIS — M2550 Pain in unspecified joint: Secondary | ICD-10-CM | POA: Diagnosis not present

## 2017-09-26 DIAGNOSIS — Z7982 Long term (current) use of aspirin: Secondary | ICD-10-CM | POA: Insufficient documentation

## 2017-09-26 DIAGNOSIS — G8918 Other acute postprocedural pain: Secondary | ICD-10-CM | POA: Diagnosis not present

## 2017-09-26 DIAGNOSIS — I252 Old myocardial infarction: Secondary | ICD-10-CM | POA: Diagnosis not present

## 2017-09-26 DIAGNOSIS — I110 Hypertensive heart disease with heart failure: Secondary | ICD-10-CM | POA: Diagnosis not present

## 2017-09-26 DIAGNOSIS — E785 Hyperlipidemia, unspecified: Secondary | ICD-10-CM | POA: Insufficient documentation

## 2017-09-26 DIAGNOSIS — R29898 Other symptoms and signs involving the musculoskeletal system: Secondary | ICD-10-CM | POA: Diagnosis not present

## 2017-09-26 DIAGNOSIS — J449 Chronic obstructive pulmonary disease, unspecified: Secondary | ICD-10-CM | POA: Diagnosis not present

## 2017-09-26 DIAGNOSIS — Z79899 Other long term (current) drug therapy: Secondary | ICD-10-CM | POA: Insufficient documentation

## 2017-09-26 DIAGNOSIS — S82892S Other fracture of left lower leg, sequela: Secondary | ICD-10-CM | POA: Diagnosis not present

## 2017-09-26 DIAGNOSIS — G459 Transient cerebral ischemic attack, unspecified: Secondary | ICD-10-CM | POA: Diagnosis not present

## 2017-09-26 HISTORY — PX: ANKLE FUSION: SHX5718

## 2017-09-26 HISTORY — DX: Acute myocardial infarction, unspecified: I21.9

## 2017-09-26 LAB — BASIC METABOLIC PANEL
Anion gap: 8 (ref 5–15)
BUN: 23 mg/dL (ref 8–23)
CHLORIDE: 107 mmol/L (ref 98–111)
CO2: 27 mmol/L (ref 22–32)
CREATININE: 0.88 mg/dL (ref 0.44–1.00)
Calcium: 9.4 mg/dL (ref 8.9–10.3)
GFR calc Af Amer: >60 mL/min (ref >60)
GFR calc non Af Amer: 57 mL/min — ABNORMAL LOW (ref >60)
GLUCOSE: 113 mg/dL — AB (ref 70–99)
POTASSIUM: 4 mmol/L (ref 3.5–5.1)
Sodium: 142 mmol/L (ref 135–145)

## 2017-09-26 LAB — CBC
HEMATOCRIT: 44.4 % (ref 36.0–46.0)
HEMOGLOBIN: 14.3 g/dL (ref 12.0–15.0)
MCH: 28.4 pg (ref 26.0–34.0)
MCHC: 32.2 g/dL (ref 30.0–36.0)
MCV: 88.3 fL (ref 78.0–100.0)
Platelets: 153 10*3/uL (ref 150–400)
RBC: 5.03 MIL/uL (ref 3.87–5.11)
RDW: 13.5 % (ref 11.5–15.5)
WBC: 5.4 10*3/uL (ref 4.0–10.5)

## 2017-09-26 LAB — GLUCOSE, CAPILLARY
GLUCOSE-CAPILLARY: 151 mg/dL — AB (ref 70–99)
Glucose-Capillary: 105 mg/dL — ABNORMAL HIGH (ref 70–99)

## 2017-09-26 SURGERY — ANKLE FUSION
Anesthesia: General | Site: Ankle | Laterality: Left

## 2017-09-26 MED ORDER — LIDOCAINE 2% (20 MG/ML) 5 ML SYRINGE
INTRAMUSCULAR | Status: AC
  Filled 2017-09-26: qty 5

## 2017-09-26 MED ORDER — EPHEDRINE 5 MG/ML INJ
INTRAVENOUS | Status: AC
  Filled 2017-09-26: qty 10

## 2017-09-26 MED ORDER — HYDROCODONE-ACETAMINOPHEN 5-325 MG PO TABS
1.0000 | ORAL_TABLET | Freq: Once | ORAL | Status: AC
  Administered 2017-09-26: 1 via ORAL

## 2017-09-26 MED ORDER — MIDAZOLAM HCL 2 MG/2ML IJ SOLN
1.0000 mg | Freq: Once | INTRAMUSCULAR | Status: AC
  Administered 2017-09-26: 1 mg via INTRAVENOUS

## 2017-09-26 MED ORDER — CEFAZOLIN SODIUM-DEXTROSE 2-4 GM/100ML-% IV SOLN
2.0000 g | INTRAVENOUS | Status: AC
  Administered 2017-09-26: 2 g via INTRAVENOUS
  Filled 2017-09-26: qty 100

## 2017-09-26 MED ORDER — FENTANYL CITRATE (PF) 100 MCG/2ML IJ SOLN
INTRAMUSCULAR | Status: AC
  Administered 2017-09-26: 100 ug via INTRAVENOUS
  Filled 2017-09-26: qty 2

## 2017-09-26 MED ORDER — ROPIVACAINE HCL 5 MG/ML IJ SOLN
INTRAMUSCULAR | Status: DC | PRN
  Administered 2017-09-26: 30 mg via PERINEURAL

## 2017-09-26 MED ORDER — LACTATED RINGERS IV SOLN
INTRAVENOUS | Status: DC
  Administered 2017-09-26: 11:00:00 via INTRAVENOUS

## 2017-09-26 MED ORDER — ONDANSETRON HCL 4 MG/2ML IJ SOLN
INTRAMUSCULAR | Status: AC
  Filled 2017-09-26: qty 2

## 2017-09-26 MED ORDER — FENTANYL CITRATE (PF) 250 MCG/5ML IJ SOLN
INTRAMUSCULAR | Status: AC
  Filled 2017-09-26: qty 5

## 2017-09-26 MED ORDER — DEXAMETHASONE SODIUM PHOSPHATE 10 MG/ML IJ SOLN
INTRAMUSCULAR | Status: AC
  Filled 2017-09-26: qty 1

## 2017-09-26 MED ORDER — PROPOFOL 10 MG/ML IV BOLUS
INTRAVENOUS | Status: AC
  Filled 2017-09-26: qty 20

## 2017-09-26 MED ORDER — HYDROCODONE-ACETAMINOPHEN 5-325 MG PO TABS: 1.0000 | ORAL_TABLET | ORAL | 0 refills | Status: DC | PRN

## 2017-09-26 MED ORDER — ONDANSETRON HCL 4 MG/2ML IJ SOLN
INTRAMUSCULAR | Status: DC | PRN
  Administered 2017-09-26: 4 mg via INTRAVENOUS

## 2017-09-26 MED ORDER — EPHEDRINE SULFATE 50 MG/ML IJ SOLN
INTRAMUSCULAR | Status: DC | PRN
  Administered 2017-09-26: 10 mg via INTRAVENOUS

## 2017-09-26 MED ORDER — FENTANYL CITRATE (PF) 100 MCG/2ML IJ SOLN: 25.0000 ug | INTRAMUSCULAR | Status: DC | PRN

## 2017-09-26 MED ORDER — MIDAZOLAM HCL 2 MG/2ML IJ SOLN
INTRAMUSCULAR | Status: AC
  Administered 2017-09-26: 1 mg via INTRAVENOUS
  Filled 2017-09-26: qty 2

## 2017-09-26 MED ORDER — MIDAZOLAM HCL 2 MG/2ML IJ SOLN
INTRAMUSCULAR | Status: AC
  Filled 2017-09-26: qty 2

## 2017-09-26 MED ORDER — PROMETHAZINE HCL 25 MG/ML IJ SOLN: 6.2500 mg | INTRAMUSCULAR | Status: DC | PRN

## 2017-09-26 MED ORDER — DEXAMETHASONE SODIUM PHOSPHATE 4 MG/ML IJ SOLN
INTRAMUSCULAR | Status: DC | PRN
  Administered 2017-09-26: 5 mg via INTRAVENOUS

## 2017-09-26 MED ORDER — CHLORHEXIDINE GLUCONATE 4 % EX LIQD: 60.0000 mL | Freq: Once | CUTANEOUS | Status: DC

## 2017-09-26 MED ORDER — SODIUM CHLORIDE 0.9 % IR SOLN
Status: DC | PRN
  Administered 2017-09-26: 1000 mL

## 2017-09-26 MED ORDER — FENTANYL CITRATE (PF) 100 MCG/2ML IJ SOLN
100.0000 ug | Freq: Once | INTRAMUSCULAR | Status: AC
  Administered 2017-09-26: 100 ug via INTRAVENOUS

## 2017-09-26 MED ORDER — PHENYLEPHRINE 40 MCG/ML (10ML) SYRINGE FOR IV PUSH (FOR BLOOD PRESSURE SUPPORT)
PREFILLED_SYRINGE | INTRAVENOUS | Status: AC
  Filled 2017-09-26: qty 10

## 2017-09-26 MED ORDER — PHENYLEPHRINE HCL 10 MG/ML IJ SOLN
INTRAMUSCULAR | Status: DC | PRN
  Administered 2017-09-26 (×2): 80 ug via INTRAVENOUS

## 2017-09-26 MED ORDER — HYDROCODONE-ACETAMINOPHEN 5-325 MG PO TABS
ORAL_TABLET | ORAL | Status: AC
  Filled 2017-09-26: qty 1

## 2017-09-26 MED ORDER — PROPOFOL 10 MG/ML IV BOLUS
INTRAVENOUS | Status: DC | PRN
  Administered 2017-09-26: 130 mg via INTRAVENOUS

## 2017-09-26 SURGICAL SUPPLY — 47 items
BANDAGE ESMARK 6X9 LF (GAUZE/BANDAGES/DRESSINGS) ×1 IMPLANT
BENZOIN TINCTURE PRP APPL 2/3 (GAUZE/BANDAGES/DRESSINGS) ×3 IMPLANT
BIT DRILL CALIBRATED 4.2 (BIT) ×1 IMPLANT
BIT DRILL CALIBRATED 5.0 MM (BIT) ×1 IMPLANT
BLADE AVERAGE 25MMX9MM (BLADE) ×1
BLADE AVERAGE 25X9 (BLADE) ×2 IMPLANT
BNDG COHESIVE 4X5 TAN STRL (GAUZE/BANDAGES/DRESSINGS) ×3 IMPLANT
BNDG ESMARK 6X9 LF (GAUZE/BANDAGES/DRESSINGS) ×3
CANISTER WOUND CARE 500ML ATS (WOUND CARE) ×3 IMPLANT
CAP END T25 HF ARTHRO NAIL EX (Cap) ×1 IMPLANT
COVER MAYO STAND STRL (DRAPES) ×3 IMPLANT
COVER SURGICAL LIGHT HANDLE (MISCELLANEOUS) ×3 IMPLANT
DRAPE INCISE IOBAN 66X45 STRL (DRAPES) ×3 IMPLANT
DRAPE OEC MINIVIEW 54X84 (DRAPES) ×3 IMPLANT
DRAPE U-SHAPE 47X51 STRL (DRAPES) ×3 IMPLANT
DRESSING PREVENA PLUS CUSTOM (GAUZE/BANDAGES/DRESSINGS) ×2 IMPLANT
DRILL BIT CALIBRATED 4.2 (BIT) ×3
DRILL BIT CALIBRATED 5.0 MM (BIT) ×3
DRSG PREVENA PLUS CUSTOM (GAUZE/BANDAGES/DRESSINGS) ×6
DURAPREP 26ML APPLICATOR (WOUND CARE) ×3 IMPLANT
ELECT REM PT RETURN 9FT ADLT (ELECTROSURGICAL) ×3
ELECTRODE REM PT RTRN 9FT ADLT (ELECTROSURGICAL) ×1 IMPLANT
ENDCAP T25 HF ARTHRO NAIL EX (Cap) ×2 IMPLANT
GLOVE BIOGEL PI IND STRL 9 (GLOVE) ×1 IMPLANT
GLOVE BIOGEL PI INDICATOR 9 (GLOVE) ×2
GLOVE SURG ORTHO 9.0 STRL STRW (GLOVE) ×3 IMPLANT
GOWN STRL REUS W/ TWL LRG LVL3 (GOWN DISPOSABLE) ×1 IMPLANT
GOWN STRL REUS W/ TWL XL LVL3 (GOWN DISPOSABLE) ×1 IMPLANT
GOWN STRL REUS W/TWL LRG LVL3 (GOWN DISPOSABLE) ×2
GOWN STRL REUS W/TWL XL LVL3 (GOWN DISPOSABLE) ×2
GUIDEWIRE 3.2X400 (WIRE) ×3 IMPLANT
KIT BASIN OR (CUSTOM PROCEDURE TRAY) ×3 IMPLANT
KIT DRSG PREVENA PLUS 7DAY 125 (MISCELLANEOUS) ×3 IMPLANT
KIT TURNOVER KIT B (KITS) ×3 IMPLANT
NAIL HINDFOOT TI 10X150MM (Nail) ×3 IMPLANT
NS IRRIG 1000ML POUR BTL (IV SOLUTION) ×3 IMPLANT
PACK ORTHO EXTREMITY (CUSTOM PROCEDURE TRAY) ×3 IMPLANT
PAD ARMBOARD 7.5X6 YLW CONV (MISCELLANEOUS) ×6 IMPLANT
REAMER ROD DEEP FLUTE 2.5X950 (INSTRUMENTS) ×3 IMPLANT
SCREW LOCK 6.0X72MM (Screw) ×3 IMPLANT
SCREW LOCKING 5.0X28MM (Screw) ×3 IMPLANT
SPONGE LAP 18X18 X RAY DECT (DISPOSABLE) ×3 IMPLANT
SUT ETHILON 2 0 PSLX (SUTURE) ×9 IMPLANT
TOWEL OR 17X24 6PK STRL BLUE (TOWEL DISPOSABLE) ×3 IMPLANT
TOWEL OR 17X26 10 PK STRL BLUE (TOWEL DISPOSABLE) ×3 IMPLANT
TUBE CONNECTING 12'X1/4 (SUCTIONS) ×1
TUBE CONNECTING 12X1/4 (SUCTIONS) ×2 IMPLANT

## 2017-09-26 NOTE — H&P (Signed)
Chelsea [name] is an 84 y.o. female.   Chief Complaint: Pain and deformity left ankle. HPI: Patient is an 84 year old woman who was hospitalized for a cardiac condition.  At this time patient had a fracture dislocation of her left ankle.  This was not treated and has now healed with significant deformity patient is at risk of skin breakdown risk of loss of limb due to the deformity and tenuous nature of the soft tissue envelope.  Past Medical History:  Diagnosis Date  . Anemia    takes Ferrous Sulfate daily  . Arthritis   . Basal cell carcinoma of breast 1998  . Bruises easily   . Dizziness   . DM (diabetes mellitus) ([place])    takes Metformin daily  . Eczema   . Emphysema lung ([place])   . GERD (gastroesophageal reflux disease)    takes Protonix daily  . Glaucoma    Bil  . History of hiatal hernia   . HTN (hypertension)    takes Diovan daily  . Hyperlipidemia   . ITP (idiopathic thrombocytopenic purpura) 2/09  . Myocardial infarction ([place])    N-stemi 2019  . Nocturia   . Osteoporosis   . Peripheral edema   . Stroke ([place]) 02/03/09   mid brain, diplopia    Past Surgical History:  Procedure Laterality Date  . BREAST LUMPECTOMY WITH RADIOACTIVE SEED LOCALIZATION Right 10/23/2014   Procedure: RIGHT BREAST LUMPECTOMY WITH RADIOACTIVE SEED LOCALIZATION;  Surgeon: Fanny Skates, MD;  Location: [facility];  Service: General;  Laterality: Right;  . CATARACT EXTRACTION Bilateral   . COLONOSCOPY    . ESOPHAGOGASTRODUODENOSCOPY    . LEFT HEART CATH AND CORONARY ANGIOGRAPHY N/A 07/09/2017   Procedure: LEFT HEART CATH AND CORONARY ANGIOGRAPHY;  Surgeon: Jettie Booze, MD;  Location: [facility];  Service: Cardiovascular;  Laterality: N/A;  . MYOMECTOMY N/A 03/14/1995   late 90's per pt  . PARATHYROIDECTOMY  2008  . REFRACTIVE SURGERY Bilateral   . TONSILLECTOMY  as a child    Family History  Problem Relation Age of Onset  . Breast cancer Mother 51  . Colon cancer Father   .  Diabetes Father        questionable  . Multiple sclerosis Daughter   . Heart disease Maternal Grandfather    Social History:  reports that she has never smoked. She has never used smokeless tobacco. She reports that she does not drink alcohol or use drugs.  Allergies:  Allergies  Allergen Reactions  . Travatan Z [Travoprost (Bak Free)] Other (See Comments)    dizziness    No medications prior to admission.    No results found for this or any previous visit (from the past 48 hour(s)). No results found.  Review of Systems  All other systems reviewed and are negative.   There were no vitals taken for this visit. Physical Exam  On examination patient has severe deformity of the ankle secondary to the fracture dislocation.  Patient does not have a palpable pulse Doppler shows a good dopplerable pulse biphasic.  Patient has no skin breakdown there is no open wounds no drainage.  Radiographs shows severe deformity of the ankle secondary to fracture dislocation. Assessment/Plan Assessment: Fracture dislocation left ankle.  Plan: Patient has been cleared for surgical intervention will plan for tibial calcaneal fusion to provide her a foot and ankle that would be weightbearing.  Risks and benefits were discussed including risk of the incision not healing risk of skin breakdown  potential for amputation.  Patient states she understands wished to proceed at this time.  Newt Minion, MD 09/26/2017, 6:45 AM

## 2017-09-26 NOTE — Anesthesia Procedure Notes (Signed)
Anesthesia Regional Block: Popliteal block   Pre-Anesthetic Checklist: ,, timeout performed, Correct Patient, Correct Site, Correct Laterality, Correct Procedure, Correct Position, site marked, Risks and benefits discussed,  Surgical consent,  Pre-op evaluation,  At surgeon's request and post-op pain management  Laterality: Left  Prep: chloraprep       Needles:  Injection technique: Single-shot  Needle Type: Echogenic Stimulator Needle          Additional Needles:   Narrative:  Start time: 09/26/2017 10:21 AM End time: 09/26/2017 10:31 AM Injection made incrementally with aspirations every 5 mL.  Performed by: Personally  Anesthesiologist: Duane Boston, MD  Additional Notes: A functioning IV was confirmed and monitors were applied.  Sterile prep and drape, hand hygiene and sterile gloves were used.  Negative aspiration and test dose prior to incremental administration of local anesthetic. The patient tolerated the procedure well.Ultrasound  guidance: relevant anatomy identified, needle position confirmed, local anesthetic spread visualized around nerve(s), vascular puncture avoided.  Image printed for medical record.

## 2017-09-26 NOTE — Anesthesia Procedure Notes (Signed)
Procedure Name: LMA Insertion Date/Time: 09/26/2017 11:31 AM Performed by: Orlie Dakin, CRNA Pre-anesthesia Checklist: Patient identified, Emergency Drugs available, Suction available and Timeout performed Patient Re-evaluated:Patient Re-evaluated prior to induction Oxygen Delivery Method: Circle system utilized Preoxygenation: Pre-oxygenation with 100% oxygen Induction Type: IV induction LMA: LMA inserted LMA Size: 4.0 Number of attempts: 1 Placement Confirmation: positive ETCO2 and breath sounds checked- equal and bilateral Tube secured with: Tape Dental Injury: Teeth and Oropharynx as per pre-operative assessment

## 2017-09-26 NOTE — Anesthesia Postprocedure Evaluation (Signed)
Anesthesia Post Note  Patient: Chelsea [name]  Procedure(s) Performed: LEFT TIB-CALC FUSION (Left Ankle)     Patient location during evaluation: PACU Anesthesia Type: General Level of consciousness: sedated Pain management: pain level controlled Vital Signs Assessment: post-procedure vital signs reviewed and stable Respiratory status: spontaneous breathing and respiratory function stable Cardiovascular status: stable Postop Assessment: no apparent nausea or vomiting Anesthetic complications: no    Last Vitals:  Vitals:   09/26/17 1350 09/26/17 1355  BP: 119/66   Pulse: 73 73  Resp: 15 13  Temp:    SpO2: 94% 94%    Last Pain:  Vitals:   09/26/17 1255  TempSrc:   PainSc: 0-No pain                 [name] DANIEL

## 2017-09-26 NOTE — Transfer of Care (Signed)
Immediate Anesthesia Transfer of Care Note  Patient: Chelsea [name]  Procedure(s) Performed: LEFT TIB-CALC FUSION (Left Ankle)  Patient Location: PACU  Anesthesia Type:GA combined with regional for post-op pain  Level of Consciousness: drowsy and responds to stimulation  Airway & Oxygen Therapy: Patient Spontanous Breathing and Patient connected to face mask oxygen  Post-op Assessment: Report given to RN and Post -op Vital signs reviewed and stable  Post vital signs: Reviewed and stable  Last Vitals:  Vitals Value Taken Time  BP 132/78 09/26/2017 12:35 PM  Temp    Pulse 68 09/26/2017 12:39 PM  Resp 21 09/26/2017 12:39 PM  SpO2 99 % 09/26/2017 12:39 PM  Vitals shown include unvalidated device data.  Last Pain:  Vitals:   09/26/17 0847  TempSrc:   PainSc: 0-No pain      Patients Stated Pain Goal: 3 (10/17/66 1661)  Complications: No apparent anesthesia complications

## 2017-09-26 NOTE — Op Note (Signed)
09/26/2017  12:34 PM  PATIENT:  Chelsea [name]    PRE-OPERATIVE DIAGNOSIS:  left ankle fracture dislocation  POST-OPERATIVE DIAGNOSIS:  Same  PROCEDURE:  LEFT TIB-CALC FUSION  SURGEON:  Newt Minion, MD  PHYSICIAN ASSISTANT:None ANESTHESIA:   General  PREOPERATIVE INDICATIONS:  Chelsea [name] is a  84 y.o. female with a diagnosis of left ankle fracture dislocation who failed conservative measures and elected for surgical management.    The risks benefits and alternatives were discussed with the patient preoperatively including but not limited to the risks of infection, bleeding, nerve injury, cardiopulmonary complications, the need for revision surgery, among others, and the patient was willing to proceed.  OPERATIVE IMPLANTS: Synthes 10 x 170 mm tibial calcaneal fusion nail  @ENCIMAGES @  OPERATIVE FINDINGS: Fracture dislocation with thin atrophic skin  OPERATIVE PROCEDURE: Patient brought the operating room after undergoing a popliteal block she then underwent a general anesthetic.  After adequate levels of anesthesia were obtained patient was placed in the right lateral decubitus position with the left side up left lower extremity was prepped using DuraPrep draped into a sterile field a timeout was called.  A lateral incision was made over the fibula the distal fractured aspect of the fibula was resected.  The bone was extremely soft and osteoporotic.  The tibia and talar with been resected perpendicular to the long axis of the tibia.  There was a large amount of medial malleolar fracture bone which was also removed through the lateral incision care was taken not to make another incision medially to further compromised soft tissue.  Once the ankle was reduced at 90 degrees a guidewire was inserted from the calcaneus up into the tibia.  C-arm fluoroscopy verified alignment this was overreamed with the starter reamer and then the 11 mm reamer.  Nail was placed and was locked distally x2  proximally x1 using the alignment jig.  The distal Was placed.  C-arm fluoroscopy verified alignment of the ankle.  The wounds were irrigated with normal saline.  The incisions were closed using 2-0 nylon a Praveena incision wound VAC was applied this was covered with web roll and Covan to provide compression for the venous insufficiency.   DISCHARGE PLANNING:  Antibiotic duration: Perioperative antibiotics  Weightbearing: Nonweightbearing on the left  Pain medication: Prescription for Vicodin  Dressing care/ Wound VAC: Continue wound VAC for 1 week  Ambulatory devices: Walker  Discharge to: Return to skilled nursing  Follow-up: In the office 1 week post operative.

## 2017-09-26 NOTE — Anesthesia Preprocedure Evaluation (Addendum)
Anesthesia Evaluation  Patient identified by MRN, date of birth, ID band Patient awake    Reviewed: Allergy & Precautions, NPO status , Patient's Chart, lab work & pertinent test results  Airway Mallampati: II   Neck ROM: full    Dental no notable dental hx. (+) Dental Advisory Given   Pulmonary COPD,    breath sounds clear to auscultation       Cardiovascular hypertension, + Past MI   Rhythm:regular Rate:Normal  Study Conclusions  - Left ventricle: The cavity size was normal. Wall thickness was   normal. Systolic function was mildly to moderately reduced. The   estimated ejection fraction was in the range of 40% to 45%.   Hypokinesis of the mid-apicalanteroseptal, anterior, and apical   myocardium; consistent with ischemia in the distribution of the   left anterior descending coronary artery. Doppler parameters are   consistent with abnormal left ventricular relaxation (grade 1   diastolic dysfunction). Doppler parameters are consistent with   elevated mean left atrial filling pressure. - Tricuspid valve: There was moderate regurgitation. - Pulmonary arteries: Systolic pressure was mildly increased. PA   peak pressure: 36 mm Hg (S).   Mid LAD lesion is 25% stenosed.  Prox LAD lesion is 25% stenosed.  Prox Cx to Mid Cx lesion is 10% stenosed.  The left ventricular systolic function is normal.  The left ventricular ejection fraction is 55-65% by visual estimate.  LV end diastolic pressure is normal.  There is no aortic valve stenosis.   Nonobstructive CAD.  Continue preventive therapy.   LVEF appears to have improved since the prior echocardiogram.  Her apical RWMA may have been from a stress-induced cardiomyopathy which has resolved.   Indications   Non-ST elevation (NSTEMI) myocardial infarction (HCC) (I21.4 (ICD-10-CM))     Neuro/Psych CVA    GI/Hepatic hiatal hernia, GERD  ,  Endo/Other  diabetes,  Type 2  Renal/GU      Musculoskeletal  (+) Arthritis ,   Abdominal   Peds  Hematology   Anesthesia Other Findings   Reproductive/Obstetrics                            Anesthesia Physical  Anesthesia Plan  ASA: III  Anesthesia Plan: General   Post-op Pain Management:  Regional for Post-op pain   Induction: Intravenous  PONV Risk Score and Plan: 3 and Ondansetron, Dexamethasone and Scopolamine patch - Pre-op  Airway Management Planned: LMA  Additional Equipment:   Intra-op Plan:   Post-operative Plan: Extubation in OR  Informed Consent: I have reviewed the patients History and Physical, chart, labs and discussed the procedure including the risks, benefits and alternatives for the proposed anesthesia with the patient or authorized representative who has indicated his/her understanding and acceptance.   Dental advisory given  Plan Discussed with: CRNA, Anesthesiologist and Surgeon  Anesthesia Plan Comments:        Anesthesia Quick Evaluation

## 2017-09-27 ENCOUNTER — Encounter (HOSPITAL_COMMUNITY): Payer: Self-pay | Admitting: Orthopedic Surgery

## 2017-09-28 ENCOUNTER — Ambulatory Visit (INDEPENDENT_AMBULATORY_CARE_PROVIDER_SITE_OTHER): Payer: Medicare Other

## 2017-09-28 DIAGNOSIS — S82892D Other fracture of left lower leg, subsequent encounter for closed fracture with routine healing: Secondary | ICD-10-CM

## 2017-09-28 NOTE — Progress Notes (Signed)
Pt is s/p a left tibial calcaneal fusion on //. The wound vac was alarming and the canister was full. The vac was removed and a dry dressing was applied. Order written for SNF to change dressing daily. May increase her pain medication per Shawn Rayburn , PA to 1 q 4 prn pain and may incorporate ibuprofen. Encourage elevation and may apply ice throughout t the day. Pt will follow up in the office with Dr. Sharol Given and call with any other questions or concerns.  Prisha Hiley, RMA,CWCA

## 2017-10-01 ENCOUNTER — Encounter (INDEPENDENT_AMBULATORY_CARE_PROVIDER_SITE_OTHER): Payer: Self-pay | Admitting: Orthopedic Surgery

## 2017-10-01 ENCOUNTER — Telehealth (INDEPENDENT_AMBULATORY_CARE_PROVIDER_SITE_OTHER): Payer: Self-pay | Admitting: Orthopedic Surgery

## 2017-10-01 DIAGNOSIS — L89890 Pressure ulcer of other site, unstageable: Secondary | ICD-10-CM | POA: Diagnosis not present

## 2017-10-01 NOTE — Telephone Encounter (Signed)
Patient called stating she is at [facility] rehab and she is suppose to get Tylenol (2tabs). Patient said she has been trying since 3:00pm to get the medication. Patient said she is in pain. Patient said the nurse did give her the other pain pill. The one she takes every 6 hours.  The number to [facility] is [phone]  The number to contact patient is [phone]

## 2017-10-02 NOTE — Telephone Encounter (Signed)
Lets see her in the office, today, if we can to see what's going on.

## 2017-10-02 NOTE — Telephone Encounter (Signed)
Called and lm on vm to advise that we can work her into the office today if she is able to come in. She does have ana appt on Thursday but do not want her to have to wait that long. Will hold message and try to reach pt again.

## 2017-10-02 NOTE — Telephone Encounter (Signed)
I called SNF and they stated that the pt is on Norco q6 and tylenol q 4 and the pt states that she is on terrible pain. The facility will not allow the pt to have the narcotic as written because he said that she is a falls risk. The pt is elevating her foot higher that her heart per nursing and will apply ice periodically. The pt states that she is in terrible pain. What can we do for her?

## 2017-10-03 NOTE — Telephone Encounter (Signed)
Will discuss with pt at appt on Thursday.

## 2017-10-04 ENCOUNTER — Ambulatory Visit (INDEPENDENT_AMBULATORY_CARE_PROVIDER_SITE_OTHER): Payer: Medicare Other | Admitting: Physician Assistant

## 2017-10-04 ENCOUNTER — Ambulatory Visit (INDEPENDENT_AMBULATORY_CARE_PROVIDER_SITE_OTHER): Payer: Medicare Other

## 2017-10-04 ENCOUNTER — Encounter (INDEPENDENT_AMBULATORY_CARE_PROVIDER_SITE_OTHER): Payer: Self-pay | Admitting: Orthopedic Surgery

## 2017-10-04 DIAGNOSIS — E43 Unspecified severe protein-calorie malnutrition: Secondary | ICD-10-CM | POA: Diagnosis not present

## 2017-10-04 DIAGNOSIS — Z981 Arthrodesis status: Secondary | ICD-10-CM

## 2017-10-04 DIAGNOSIS — E1142 Type 2 diabetes mellitus with diabetic polyneuropathy: Secondary | ICD-10-CM

## 2017-10-04 DIAGNOSIS — M25572 Pain in left ankle and joints of left foot: Secondary | ICD-10-CM

## 2017-10-04 NOTE — Progress Notes (Signed)
Office Visit Note   Patient: Chelsea [name]           Date of Birth: Feb 13, 1928           MRN: [id number] Visit Date: 10/04/2017              Requested by: Hendricks Limes, [place], [place] [id number] PCP: Hendricks Limes, MD   Assessment & Plan: Visit Diagnoses:  1. Closed fracture dislocation of left ankle with routine healing, subsequent encounter   2. Pain in left ankle and joints of left foot   3. Diabetic polyneuropathy associated with type 2 diabetes mellitus ([place])   4. Severe protein-calorie malnutrition ([place])     Plan: Patient will continue nonweightbearing over the left leg.  The nursing staff can wash the left foot and ankle with soap and water daily dry and cover the incisional areas with 4 x 4 gauze kerlix, Ace wrap and then reapply her foot splint.  She can continue ice packs up to 4 times daily.  She will follow-up in 2 weeks for suture removal.  Will also repeat x-rays of the left ankle at this time.  Follow-Up Instructions: No follow-ups on file.   Orders:  Orders Placed This Encounter  Procedures  . XR Ankle Complete Left   No orders of the defined types were placed in this encounter.     Procedures: No procedures performed   Clinical Data: No additional findings.   Subjective: Chief Complaint  Patient presents with  . Left Ankle - Routine Post Op    HPI This is an 84 year old female who is seen for postoperative follow-up following ORIF of her left ankle fracture dislocation with with left tibial calcaneal fusion on 09/26/2017.  The patient reports her pain is much improved.  Her edema is much improved.  She was not getting her pain medication as frequently as she wanted it at [facility] the nursing facility that she is currently on and this is been difficult but she does report that her pain is improved now. Review of Systems   Objective: Vital Signs: There were no vitals taken for this visit.  Physical Exam   elderly female  who presents with a wheelchair level.  She does report her pain is improving.  Her swelling is much improved.  She is alert and oriented. Ortho Exam Examination of the left ankle shows the edema to be markedly improved.  Incisions are intact with no cellulitis.  Sutures are intact and we will leave this in place and plan to remove the next visit. Specialty Comments:  No specialty comments available.  Imaging: No results found.   PMFS History: Patient Active Problem List   Diagnosis Date Noted  . Closed fracture dislocation of left ankle joint   . Pressure injury of skin 05/16/2017  . Acute kidney failure ([place])   . Acute on chronic combined systolic and diastolic CHF (congestive heart failure) ([place])   . NSTEMI (non-ST elevated myocardial infarction) ([place]) 05/13/2017  . HTN (hypertension) 05/13/2017  . Diabetes mellitus ([place]) 04/10/2017  . Venous stasis dermatitis of both lower extremities 04/10/2017  . Neuropathy 04/10/2017  . Chronic osteomyelitis of left foot ([place]) 04/10/2017  . Breast cancer of upper-inner quadrant of right female breast ([place]) 10/05/2014  . Occult blood positive stool 03/24/2014  . History of ITP 04/14/2011   Past Medical History:  Diagnosis Date  . Anemia    takes Ferrous Sulfate daily  . Arthritis   .  Basal cell carcinoma of breast 1998  . Bruises easily   . Dizziness   . DM (diabetes mellitus) ([place])    takes Metformin daily  . Eczema   . Emphysema lung ([place])   . GERD (gastroesophageal reflux disease)    takes Protonix daily  . Glaucoma    Bil  . History of hiatal hernia   . HTN (hypertension)    takes Diovan daily  . Hyperlipidemia   . ITP (idiopathic thrombocytopenic purpura) 2/09  . Myocardial infarction ([place])    N-stemi 2019  . Nocturia   . Osteoporosis   . Peripheral edema   . Stroke ([place]) 02/03/09   mid brain, diplopia    Family History  Problem Relation Age of Onset  . Breast cancer Mother 62  . Colon cancer Father   . Diabetes  Father        questionable  . Multiple sclerosis Daughter   . Heart disease Maternal Grandfather     Past Surgical History:  Procedure Laterality Date  . ANKLE FUSION Left 09/26/2017   Procedure: LEFT TIB-CALC FUSION;  Surgeon: Newt Minion, MD;  Location: [facility];  Service: Orthopedics;  Laterality: Left;  . BREAST LUMPECTOMY WITH RADIOACTIVE SEED LOCALIZATION Right 10/23/2014   Procedure: RIGHT BREAST LUMPECTOMY WITH RADIOACTIVE SEED LOCALIZATION;  Surgeon: Fanny Skates, MD;  Location: [facility];  Service: General;  Laterality: Right;  . CATARACT EXTRACTION Bilateral   . COLONOSCOPY    . ESOPHAGOGASTRODUODENOSCOPY    . LEFT HEART CATH AND CORONARY ANGIOGRAPHY N/A 07/09/2017   Procedure: LEFT HEART CATH AND CORONARY ANGIOGRAPHY;  Surgeon: Jettie Booze, MD;  Location: [facility];  Service: Cardiovascular;  Laterality: N/A;  . MYOMECTOMY N/A 03/14/1995   late 90's per pt  . PARATHYROIDECTOMY  2008  . REFRACTIVE SURGERY Bilateral   . TONSILLECTOMY  as a child   Social History   Occupational History  . Not on file  Tobacco Use  . Smoking status: Never Smoker  . Smokeless tobacco: Never Used  Substance and Sexual Activity  . Alcohol use: No    Alcohol/week: 0.0 standard drinks  . Drug use: No  . Sexual activity: Not on file

## 2017-10-05 ENCOUNTER — Telehealth (INDEPENDENT_AMBULATORY_CARE_PROVIDER_SITE_OTHER): Payer: Self-pay | Admitting: Orthopedic Surgery

## 2017-10-05 NOTE — Telephone Encounter (Signed)
Patient states [facility] will not give her any ice packs and her ankle is swelling. She just wants Dr. Sharol Given to be aware of whats going on.

## 2017-10-05 NOTE — Telephone Encounter (Signed)
Okay to order PT, but should be non weight bearing over the left leg if they start to work on standing/ambulation. They may use ice to the left ankle from my standpoint.  Thanks, [employer]

## 2017-10-05 NOTE — Telephone Encounter (Signed)
Noted. Will fax PT order to [facility] at [phone]

## 2017-10-05 NOTE — Telephone Encounter (Signed)
Patient called advised she is at [facility] and the staff there do not use ice packs and will not honor the note that shawn put in her chart yesterday. [facility] will not use the green peas either. Patient said [facility] said a (PT) could use the green pea ice pack but, she do not have a (PT)  The number to contact patient is [phone]

## 2017-10-05 NOTE — Telephone Encounter (Signed)
Can you advise on message concerning getting PT for patient at [facility].  Thank You.

## 2017-10-09 NOTE — Telephone Encounter (Signed)
Noted  

## 2017-10-10 ENCOUNTER — Other Ambulatory Visit: Payer: Self-pay

## 2017-10-10 MED ORDER — HYDROCODONE-ACETAMINOPHEN 5-325 MG PO TABS: 1.0000 | ORAL_TABLET | Freq: Four times a day (QID) | ORAL | 0 refills | Status: DC | PRN

## 2017-10-10 NOTE — Telephone Encounter (Signed)
Hard script given to Durenda Age, NP in SNF

## 2017-10-15 DIAGNOSIS — L988 Other specified disorders of the skin and subcutaneous tissue: Secondary | ICD-10-CM | POA: Diagnosis not present

## 2017-10-15 DIAGNOSIS — L97522 Non-pressure chronic ulcer of other part of left foot with fat layer exposed: Secondary | ICD-10-CM | POA: Diagnosis not present

## 2017-10-18 ENCOUNTER — Ambulatory Visit (INDEPENDENT_AMBULATORY_CARE_PROVIDER_SITE_OTHER): Payer: Medicare Other | Admitting: Physician Assistant

## 2017-10-18 ENCOUNTER — Ambulatory Visit (INDEPENDENT_AMBULATORY_CARE_PROVIDER_SITE_OTHER): Payer: Medicare Other

## 2017-10-18 ENCOUNTER — Ambulatory Visit (INDEPENDENT_AMBULATORY_CARE_PROVIDER_SITE_OTHER): Payer: Self-pay

## 2017-10-18 ENCOUNTER — Encounter (INDEPENDENT_AMBULATORY_CARE_PROVIDER_SITE_OTHER): Payer: Self-pay | Admitting: Physician Assistant

## 2017-10-18 VITALS — Ht 62.0 in | Wt 159.2 lb

## 2017-10-18 DIAGNOSIS — E43 Unspecified severe protein-calorie malnutrition: Secondary | ICD-10-CM

## 2017-10-18 DIAGNOSIS — Z981 Arthrodesis status: Secondary | ICD-10-CM

## 2017-10-18 DIAGNOSIS — E1142 Type 2 diabetes mellitus with diabetic polyneuropathy: Secondary | ICD-10-CM

## 2017-10-18 NOTE — Progress Notes (Signed)
Office Visit Note   Patient: Chelsea [name]           Date of Birth: 21-Mar-1928           MRN: [id number] Visit Date: 10/18/2017              Requested by: Hendricks Limes, MD [address] [place], [place] [address] PCP: Hendricks Limes, MD  Chief Complaint  Patient presents with  . Left Foot - Follow-up, Pain    S/p ankle fusion  . Left Ankle - Pain, Follow-up      HPI: This 84 year old female who presents for postoperative follow-up following left a ankle tibial calcaneal fusion on 09/26/2017 following a chronic left ankle fracture dislocation.  She continues to receive treatment at [facility] facility.  She is been nonweightbearing over the left lower extremity.  She is continuing to have some discomfort over the left lower extremity.  She reports that the pain is not nearly as bad as it was before and we discussed trying alternatives including just taking Tylenol for milder discomfort.  Her edema is markedly improved.  Assessment & Plan: Visit Diagnoses:  1. S/P ankle fusion   2. Diabetic polyneuropathy associated with type 2 diabetes mellitus ([place])   3. Severe protein-calorie malnutrition ([place])     Plan: Sutures were harvested today.  Radiographs were obtained and show good position and alignment and good callus formation and we are going to allow her to begin some weightbearing as tolerated in a walker boot ambulating with a walker.  Physical therapy may begin working with her for progressive ambulation.  She may get in the shower and wash the ankle daily with soap and water.  She should wear compression stockings bilaterally except for her showering and she will work on getting some silver socks to utilize for this.  Orders were sent to [facility] skilled nursing facility.  She is going to follow-up in 4 weeks with follow-up radiographs at this time.  Follow-Up Instructions: Return in about 4 weeks (around 11/15/2017).   Ortho Exam  Patient is alert, oriented,  no adenopathy, well-dressed, normal affect, normal respiratory effort. The left ankle incisions are healing well and are without drainage.  There is no signs of cellulitis.  Her edema is markedly improved.  She has good pedal pulses.  Sutures were removed today.  She presents with a wheelchair level with her daughter for this exam today.  Imaging: Xr Ankle Complete Left  Result Date: 10/19/2017 Radiographs of the patient's left ankle were reviewed with Dr. Sharol Given They show good position and alignment of the hardware in bones with good early callus formation.  No images are attached to the encounter.  Labs: Lab Results  Component Value Date   HGBA1C 7.1 (A) 09/21/2017   HGBA1C 6.8 (H) 04/17/2017   HGBA1C 7.2 (H) 10/20/2014   ESRSEDRATE 9 04/17/2017   ESRSEDRATE 11 02/04/2009   CRP 3.4 04/17/2017   CRP 0.8 (H) 02/04/2009   REPTSTATUS 05/18/2017 FINAL 05/12/2017   GRAMSTAIN  03/08/2017    NO WBC SEEN FEW GRAM NEGATIVE RODS FEW GRAM POSITIVE COCCI IN PAIRS RARE GRAM POSITIVE RODS Performed at [facility], [place] [address], [place], [place] [address]    CULT  05/12/2017    NO GROWTH 5 DAYS Performed at [facility] [address], [place], [place] [address]    [place] 03/08/2017   LABORGA ENTEROCOCCUS FAECALIS 03/08/2017     Lab  Results  Component Value Date   ALBUMIN 2.2 (L) 05/29/2017   ALBUMIN 2.2 (L) 05/28/2017   ALBUMIN 2.2 (L) 05/27/2017    Body mass index is 29.12 kg/m.  Orders:  Orders Placed This Encounter  Procedures  . XR Ankle Complete Left   No orders of the defined types were placed in this encounter.    Procedures: No procedures performed  Clinical Data: No additional findings.  ROS:  All other systems negative, except as noted in the HPI. Review of Systems  Objective: Vital Signs: Ht 5\' 2"  (1.575 m)   Wt 159 lb 3.2 oz (72.2 kg)   BMI 29.12 kg/m   Specialty Comments:  No specialty comments  available.  PMFS History: Patient Active Problem List   Diagnosis Date Noted  . Closed fracture dislocation of left ankle joint   . Pressure injury of skin 05/16/2017  . Acute kidney failure ([place])   . Acute on chronic combined systolic and diastolic CHF (congestive heart failure) ([place])   . NSTEMI (non-ST elevated myocardial infarction) ([place]) 05/13/2017  . HTN (hypertension) 05/13/2017  . Diabetes mellitus ([place]) 04/10/2017  . Venous stasis dermatitis of both lower extremities 04/10/2017  . Neuropathy 04/10/2017  . Chronic osteomyelitis of left foot ([place]) 04/10/2017  . Breast cancer of upper-inner quadrant of right female breast ([place]) 10/05/2014  . Occult blood positive stool 03/24/2014  . History of ITP 04/14/2011   Past Medical History:  Diagnosis Date  . Anemia    takes Ferrous Sulfate daily  . Arthritis   . Basal cell carcinoma of breast 1998  . Bruises easily   . Dizziness   . DM (diabetes mellitus) ([place])    takes Metformin daily  . Eczema   . Emphysema lung ([place])   . GERD (gastroesophageal reflux disease)    takes Protonix daily  . Glaucoma    Bil  . History of hiatal hernia   . HTN (hypertension)    takes Diovan daily  . Hyperlipidemia   . ITP (idiopathic thrombocytopenic purpura) 2/09  . Myocardial infarction ([place])    N-stemi 2019  . Nocturia   . Osteoporosis   . Peripheral edema   . Stroke ([place]) 02/03/09   mid brain, diplopia    Family History  Problem Relation Age of Onset  . Breast cancer Mother 48  . Colon cancer Father   . Diabetes Father        questionable  . Multiple sclerosis Daughter   . Heart disease Maternal Grandfather     Past Surgical History:  Procedure Laterality Date  . ANKLE FUSION Left 09/26/2017   Procedure: LEFT TIB-CALC FUSION;  Surgeon: Newt Minion, MD;  Location: [facility];  Service: Orthopedics;  Laterality: Left;  . BREAST LUMPECTOMY WITH RADIOACTIVE SEED LOCALIZATION Right 10/23/2014   Procedure: RIGHT BREAST LUMPECTOMY WITH  RADIOACTIVE SEED LOCALIZATION;  Surgeon: Fanny Skates, MD;  Location: [facility];  Service: General;  Laterality: Right;  . CATARACT EXTRACTION Bilateral   . COLONOSCOPY    . ESOPHAGOGASTRODUODENOSCOPY    . LEFT HEART CATH AND CORONARY ANGIOGRAPHY N/A 07/09/2017   Procedure: LEFT HEART CATH AND CORONARY ANGIOGRAPHY;  Surgeon: Jettie Booze, MD;  Location: [facility];  Service: Cardiovascular;  Laterality: N/A;  . MYOMECTOMY N/A 03/14/1995   late 90's per pt  . PARATHYROIDECTOMY  2008  . REFRACTIVE SURGERY Bilateral   . TONSILLECTOMY  as a child   Social History   Occupational History  . Not on file  Tobacco Use  . Smoking status: Never Smoker  . Smokeless tobacco: Never Used  Substance and Sexual Activity  . Alcohol use: No    Alcohol/week: 0.0 standard drinks  . Drug use: No  . Sexual activity: Not on file

## 2017-10-19 ENCOUNTER — Encounter (INDEPENDENT_AMBULATORY_CARE_PROVIDER_SITE_OTHER): Payer: Self-pay | Admitting: Physician Assistant

## 2017-10-19 DIAGNOSIS — R2681 Unsteadiness on feet: Secondary | ICD-10-CM | POA: Diagnosis not present

## 2017-10-19 DIAGNOSIS — R262 Difficulty in walking, not elsewhere classified: Secondary | ICD-10-CM | POA: Diagnosis not present

## 2017-10-19 DIAGNOSIS — M86672 Other chronic osteomyelitis, left ankle and foot: Secondary | ICD-10-CM | POA: Diagnosis not present

## 2017-10-22 DIAGNOSIS — R262 Difficulty in walking, not elsewhere classified: Secondary | ICD-10-CM | POA: Diagnosis not present

## 2017-10-22 DIAGNOSIS — M86672 Other chronic osteomyelitis, left ankle and foot: Secondary | ICD-10-CM | POA: Diagnosis not present

## 2017-10-22 DIAGNOSIS — L97522 Non-pressure chronic ulcer of other part of left foot with fat layer exposed: Secondary | ICD-10-CM | POA: Diagnosis not present

## 2017-10-22 DIAGNOSIS — L988 Other specified disorders of the skin and subcutaneous tissue: Secondary | ICD-10-CM | POA: Diagnosis not present

## 2017-10-22 DIAGNOSIS — R2681 Unsteadiness on feet: Secondary | ICD-10-CM | POA: Diagnosis not present

## 2017-10-23 DIAGNOSIS — R262 Difficulty in walking, not elsewhere classified: Secondary | ICD-10-CM | POA: Diagnosis not present

## 2017-10-23 DIAGNOSIS — M86672 Other chronic osteomyelitis, left ankle and foot: Secondary | ICD-10-CM | POA: Diagnosis not present

## 2017-10-23 DIAGNOSIS — R2681 Unsteadiness on feet: Secondary | ICD-10-CM | POA: Diagnosis not present

## 2017-10-24 ENCOUNTER — Non-Acute Institutional Stay (SKILLED_NURSING_FACILITY): Payer: Medicare Other | Admitting: Adult Health

## 2017-10-24 ENCOUNTER — Encounter: Payer: Self-pay | Admitting: Adult Health

## 2017-10-24 DIAGNOSIS — R262 Difficulty in walking, not elsewhere classified: Secondary | ICD-10-CM | POA: Diagnosis not present

## 2017-10-24 DIAGNOSIS — I5042 Chronic combined systolic (congestive) and diastolic (congestive) heart failure: Secondary | ICD-10-CM

## 2017-10-24 DIAGNOSIS — Z794 Long term (current) use of insulin: Secondary | ICD-10-CM | POA: Diagnosis not present

## 2017-10-24 DIAGNOSIS — E1159 Type 2 diabetes mellitus with other circulatory complications: Secondary | ICD-10-CM | POA: Diagnosis not present

## 2017-10-24 DIAGNOSIS — M86672 Other chronic osteomyelitis, left ankle and foot: Secondary | ICD-10-CM | POA: Diagnosis not present

## 2017-10-24 DIAGNOSIS — I252 Old myocardial infarction: Secondary | ICD-10-CM | POA: Diagnosis not present

## 2017-10-24 DIAGNOSIS — I10 Essential (primary) hypertension: Secondary | ICD-10-CM | POA: Diagnosis not present

## 2017-10-24 DIAGNOSIS — R2681 Unsteadiness on feet: Secondary | ICD-10-CM | POA: Diagnosis not present

## 2017-10-24 DIAGNOSIS — K5901 Slow transit constipation: Secondary | ICD-10-CM

## 2017-10-24 NOTE — Progress Notes (Signed)
Location:  [place] Room Number: 217-A Place of Service:  SNF (31) Provider:  Durenda Age, NP  Patient Care Team: Hendricks Limes, MD as PCP - General (Internal Medicine) Nahser, Wonda Cheng, MD as PCP - Cardiology (Cardiology) Thressa Sheller, MD as Consulting Physician (Internal Medicine) Fanny Skates, MD as Consulting Physician (General Surgery) Truitt Merle, MD as Consulting Physician (Hematology) Thea Silversmith, MD as Consulting Physician (Radiation Oncology) Mauro Kaufmann, RN as Registered Nurse Rockwell Germany, RN as Registered Nurse Jake Shark, Johny Blamer, NP as Nurse Practitioner (Nurse Practitioner) Nickola Major, NP as Nurse Practitioner (Internal Medicine)  Extended Emergency Contact Information Primary Emergency Contact: [facility] Address: [address] [place]          [place], [place] [address] Johnnette Litter of [place] Phone: [phone] Work Phone: [phone] Relation: Daughter Secondary Emergency Contact: Bartholome Bill Address: [place]          [place], [place] [address] Johnnette Litter of [place] Phone: [phone] Mobile Phone: [phone] Relation: Grandson  Code Status:  Full Code  Goals of care: Advanced Directive information Advanced Directives 09/26/2017  Does Patient Have a Medical Advance Directive? Yes  Type of Paramedic of [place];Living will  Does patient want to make changes to medical advance directive? No - Patient declined  Copy of [place] in Chart? No - copy requested  Would patient like information on creating a medical advance directive? -     Chief Complaint  Patient presents with  . Medical Management of Chronic Issues    The patient is seen for a routine [facility] visit    HPI:  Pt is an 84 y.o. female seen today for medical management of chronic diseases.  She is a long-term care resident of [facility].  She has a PMH of chronic osteomyelitis of her left foot, hypertension, acute kidney failure, diabetes, and acute on chronic combined diastolic/systolic heart failure. She was seen in her room today. She requested that she gets her Lactulose PRN instead of routinely Q HS.    Past Medical History:  Diagnosis Date  . Anemia    takes Ferrous Sulfate daily  . Arthritis   . Basal cell carcinoma of breast 1998  . Bruises easily   . Dizziness   . DM (diabetes mellitus) ([place])    takes Metformin daily  . Eczema   . Emphysema lung ([place])   . GERD (gastroesophageal reflux disease)    takes Protonix daily  . Glaucoma    Bil  . History of hiatal hernia   . HTN (hypertension)    takes Diovan daily  . Hyperlipidemia   . ITP (idiopathic thrombocytopenic purpura) 2/09  . Myocardial infarction ([place])    N-stemi 2019  . Nocturia   . Osteoporosis   . Peripheral edema   . Stroke ([place]) 02/03/09   mid brain, diplopia   Past Surgical History:  Procedure Laterality Date  . ANKLE FUSION Left 09/26/2017   Procedure: LEFT TIB-CALC FUSION;  Surgeon: Newt Minion, MD;  Location: [place];  Service: Orthopedics;  Laterality: Left;  . BREAST LUMPECTOMY WITH RADIOACTIVE SEED LOCALIZATION Right 10/23/2014   Procedure: RIGHT BREAST LUMPECTOMY WITH RADIOACTIVE SEED LOCALIZATION;  Surgeon: Fanny Skates, MD;  Location: [facility];  Service: General;  Laterality: Right;  . CATARACT EXTRACTION Bilateral   . COLONOSCOPY    . ESOPHAGOGASTRODUODENOSCOPY    . LEFT HEART CATH AND CORONARY ANGIOGRAPHY N/A  07/09/2017   Procedure: LEFT HEART CATH AND CORONARY ANGIOGRAPHY;  Surgeon: Jettie Booze, MD;  Location: [facility];  Service: Cardiovascular;  Laterality: N/A;  . MYOMECTOMY N/A 03/14/1995   late 90's per pt  . PARATHYROIDECTOMY  2008  . REFRACTIVE SURGERY Bilateral   . TONSILLECTOMY  as a child    Allergies  Allergen Reactions  . Travatan Z [Travoprost (Bak Free)] Other (See  Comments)    dizziness    Outpatient Encounter Medications as of 10/24/2017  Medication Sig  . acetaminophen (TYLENOL) 325 MG tablet Take 650 mg by mouth every 4 (four) hours as needed for mild pain.  . Amino Acids-Protein Hydrolys (FEEDING SUPPLEMENT, PRO-STAT SUGAR FREE 64,) LIQD Take 30 mLs by mouth daily.  Marland Kitchen aspirin 81 MG chewable tablet Chew 81 mg by mouth daily after breakfast.   . bisacodyl (DULCOLAX) 10 MG suppository Place 10 mg rectally daily as needed for moderate constipation (If not relieved by MOM).   . carvedilol (COREG) 3.125 MG tablet Take 1 tablet (3.125 mg total) by mouth 2 (two) times daily with a meal.  . Cholecalciferol (VITAMIN D3) 1000 units CAPS Take 2,000 Units by mouth daily.   Marland Kitchen geriatric multivitamins-minerals (ELDERTONIC/GEVRABON) LIQD Take 15 mLs by mouth 2 (two) times daily.  Marland Kitchen HYDROcodone-acetaminophen (NORCO/VICODIN) 5-325 MG tablet Take 1 tablet by mouth every 6 (six) hours as needed for moderate pain.  . hydrOXYzine (ATARAX/VISTARIL) 10 MG tablet Take 10 mg by mouth every 6 (six) hours as needed for itching.  . insulin glargine (LANTUS) 100 UNIT/ML injection Inject 5 Units into the skin at bedtime.   . isosorbide mononitrate (IMDUR) 30 MG 24 hr tablet Take 0.5 tablets (15 mg total) by mouth daily.  Marland Kitchen lactulose (CHRONULAC) 10 GM/15ML solution Take 30 g by mouth at bedtime.   Marland Kitchen losartan (COZAAR) 25 MG tablet Take 25 mg by mouth daily.  . magnesium hydroxide (MILK OF MAGNESIA) 400 MG/5ML suspension Take 30 mLs by mouth daily as needed for mild constipation (If no BM in 3 days).  . magnesium oxide (MAG-OX) 400 (241.3 Mg) MG tablet Take 1 tablet (400 mg total) by mouth 2 (two) times daily.  . Multiple Vitamins-Minerals (MULTIVITAMIN PO) Take 1 tablet by mouth daily.  . nitroGLYCERIN (NITROSTAT) 0.4 MG SL tablet Place 1 tablet (0.4 mg total) under the tongue every 5 (five) minutes x 3 doses as needed for chest pain.  . Omega-3 Fatty Acids (FISH OIL) 1000 MG CAPS  Take 1,000 mg by mouth 2 (two) times daily.   . pantoprazole (PROTONIX) 40 MG tablet Take 40 mg by mouth daily after breakfast.   . polyethylene glycol (MIRALAX / GLYCOLAX) packet Take 17 g by mouth daily.  . sennosides-docusate sodium (SENOKOT-S) 8.6-50 MG tablet Take 2 tablets by mouth 2 (two) times daily.  . Sodium Phosphates (RA SALINE ENEMA) 19-7 GM/118ML ENEM Place 1 each rectally daily as needed (for constipation. If not relieved by Bisacodyl Suppository).    No facility-administered encounter medications on file as of 10/24/2017.     Review of Systems  GENERAL: No change in appetite, no fatigue, no weight changes, no fever, chills or weakness MOUTH and THROAT: Denies oral discomfort, gingival pain or bleeding RESPIRATORY: no cough, SOB, DOE, wheezing, hemoptysis CARDIAC: No chest pain, or palpitations GI: No abdominal pain, diarrhea, constipation, heart burn, nausea or vomiting GU: Denies dysuria, frequency, hematuria, incontinence, or discharge PSYCHIATRIC: Denies feelings of depression or anxiety. No report of hallucinations, insomnia, paranoia, or  agitation    Immunization History  Administered Date(s) Administered  . Influenza Split 01/14/2011  . Influenza Whole 01/27/2010  . Influenza-Unspecified 01/09/2011, 11/06/2016  . Pneumococcal Polysaccharide-23 07/04/2007  . Pneumococcal-Unspecified 06/26/2017   Pertinent  Health Maintenance Due  Topic Date Due  . FOOT EXAM  09/02/1938  . OPHTHALMOLOGY EXAM  09/02/1938  . INFLUENZA VACCINE  09/06/2017  . HEMOGLOBIN A1C  03/24/2018  . PNA vac Low Risk Adult (2 of 2 - PCV13) 06/27/2018  . DEXA SCAN  Completed   Fall Risk  09/10/2017 07/17/2017 06/14/2017 05/10/2017 04/19/2017  Falls in the past year? Yes Yes Yes Yes No  Number falls in past yr: 1 1 - 1 -  Injury with Fall? Yes Yes Yes No -  Comment - - - "fall in hallway at home" -  Risk for fall due to : History of fall(s) - - Impaired balance/gait -     Vitals:   10/24/17  1117  BP: 118/74  Pulse: 74  Resp: 20  Temp: 98.7 F (37.1 C)  TempSrc: Oral  SpO2: 100%  Weight: 163 lb 6.4 oz (74.1 kg)  Height: 5\' 2"  (1.575 m)   Body mass index is 29.89 kg/m.  Physical Exam  GENERAL APPEARANCE: Well nourished. In no acute distress. Normal body habitus SKIN:  Skin is warm and dry. MOUTH and THROAT: Lips are without lesions. Oral mucosa is moist and without lesions. Tongue is normal in shape, size, and color and without lesions RESPIRATORY: Breathing is even & unlabored, BS CTAB CARDIAC: RRR, no murmur,no extra heart sounds, LLE trace edema GI: Abdomen soft, normal BS, no masses, no tenderness, no hepatomegaly, no splenomegaly EXTREMITIES:  Able to move X 4 extremities NEUROLOGICAL: There is no tremor. Speech is clear PSYCHIATRIC: Alert and oriented X 3. Affect and behavior are appropriate  Labs reviewed: Recent Labs    05/17/17 0313 05/18/17 0419  05/27/17 0247 05/28/17 0342 05/29/17 0349  07/09/17 1000 08/13/17 09/26/17 0847  NA 133* 132*   < > 136 132* 135   < > 139 141 142  K 5.2* 5.6*   < > 4.5 3.9 4.0   < > 4.0 4.1 4.0  CL 104 103   < > 103 100* 103  --  106  --  107  CO2 18* 18*   < > 19* 19* 20*  --  26  --  27  GLUCOSE 148* 136*   < > 118* 108* 119*  --  125*  --  113*  BUN 57* 74*   < > 117* 102* 105*   < > 13 22* 23  CREATININE 5.78* 6.29*   < > 4.86* 4.17* 3.76*   < > 1.00 0.9 0.88  CALCIUM 7.8* 8.3*   < > 8.6* 8.5* 8.6*  --  9.2  --  9.4  MG 1.8 1.7  --   --   --  1.2*  --   --   --   --   PHOS  --  6.8*   < > 6.8* 5.5* 5.5*  --   --   --   --    < > = values in this interval not displayed.   Recent Labs    04/17/17 1146 05/17/17 0313  05/21/17 0317  05/27/17 0247 05/28/17 0342 05/29/17 0349  AST 21 144*  --  25  --   --   --   --   ALT 14 127*  --  60*  --   --   --   --  ALKPHOS  --  73  --  56  --   --   --   --   BILITOT 0.5 0.6  --  0.6  --   --   --   --   PROT 6.5 5.0*  --  5.1*  --   --   --   --   ALBUMIN  --   1.9*   < > 1.8*   < > 2.2* 2.2* 2.2*   < > = values in this interval not displayed.   Recent Labs    05/12/17 2326  05/13/17 0944  05/26/17 0312 05/28/17 0342 06/26/17 09/26/17 0847  WBC 9.6  --  11.9*   < > 8.0 7.7 5.0 5.4  NEUTROABS 8.1*  --  9.2*  --   --   --  3  --   HGB 16.3*   < > 12.1   < > 9.9* 9.7* 11.7* 14.3  HCT 49.5*   < > 37.5   < > 28.8* 29.9* 35* 44.4  MCV 85.3  --  84.7   < > 83.5 84.7  --  88.3  PLT 214  --  234   < > 301 288 195 153   < > = values in this interval not displayed.   Lab Results  Component Value Date   TSH 2.057 Test methodology is 3rd generation TSH 02/04/2009    Lab Results  Component Value Date   HGBA1C 7.1 (A) 09/21/2017   Lab Results  Component Value Date   CHOL 113 05/13/2017   HDL 52 05/13/2017   LDLCALC 55 05/13/2017   TRIG 28 05/13/2017   CHOLHDL 2.2 05/13/2017    Significant Diagnostic Results in last 30 days:  Xr Ankle Complete Left  Result Date: 10/19/2017 Radiographs of the patient's left ankle were reviewed with Dr. Sharol Given They show good position and alignment of the hardware in bones with good early callus formation.  Xr Ankle Complete Left  Result Date: 10/04/2017 The 3 view of the left ankle shows good position and alignment of the tibial calcaneal fusion with hardware in place.  There is little callus formation as yet.   Assessment/Plan  1. History of non-ST elevation myocardial infarction (NSTEMI) - no complaints of chest pain, continue NTG when necessary, isosorbide MN ER 30 mg 1/2 tab=15 mg daily, Aspirin 81 mg 1 tadaily   2. Essential hypertension - ell-controlled, continue losartan 25 mg 1 tab daily and carvedilol 3.125 mg 1 tablet twice a day   3. Type 2 diabetes mellitus with other circulatory complication, with long-term current use of insulin (HCC) - well controlled, continue Lantus 100 units/mL inject 5 units subcutaneous daily at bedtime,  Lab Results  Component Value Date   HGBA1C 7.1 (A) 09/21/2017       4. Chronic combined systolic and diastolic heart failure (HCC) - no SOB, continue carvedilol 3.125 mg 1 tab twice a day   5. Slow transit constipation - she verbalized having soft BM, will change Lactulose to PRN and continue MiraLAX 17 g by mouth daily    Family/ staff Communication: Discussed plan of care with resident.  Labs/tests ordered:  None  Goals of care:   Long-term care.   Durenda Age, NP [facility] [phone] (Monday-Friday 8:00 a.m. - 5:00 p.m.) [phone] (after hours)

## 2017-10-25 ENCOUNTER — Telehealth (INDEPENDENT_AMBULATORY_CARE_PROVIDER_SITE_OTHER): Payer: Self-pay | Admitting: Orthopedic Surgery

## 2017-10-25 DIAGNOSIS — R262 Difficulty in walking, not elsewhere classified: Secondary | ICD-10-CM | POA: Diagnosis not present

## 2017-10-25 DIAGNOSIS — R2681 Unsteadiness on feet: Secondary | ICD-10-CM | POA: Diagnosis not present

## 2017-10-25 DIAGNOSIS — M86672 Other chronic osteomyelitis, left ankle and foot: Secondary | ICD-10-CM | POA: Diagnosis not present

## 2017-10-25 NOTE — Telephone Encounter (Signed)
Patient wears compression socks for a couple of  hours and the left leg starts swelling and becomes very painful. Patients nurse at [facility] said she might need a bigger size sock. Dr. Sharol Given measured her and gave her the rx. What should patient do? # [phone]

## 2017-10-26 DIAGNOSIS — R262 Difficulty in walking, not elsewhere classified: Secondary | ICD-10-CM | POA: Diagnosis not present

## 2017-10-26 DIAGNOSIS — M86672 Other chronic osteomyelitis, left ankle and foot: Secondary | ICD-10-CM | POA: Diagnosis not present

## 2017-10-26 DIAGNOSIS — R2681 Unsteadiness on feet: Secondary | ICD-10-CM | POA: Diagnosis not present

## 2017-10-26 NOTE — Telephone Encounter (Signed)
See message below °

## 2017-10-27 NOTE — Telephone Encounter (Signed)
She needs to elevate her feet level with her heart when seated, otherwise her legs will swell above the compression socks

## 2017-10-29 DIAGNOSIS — R2681 Unsteadiness on feet: Secondary | ICD-10-CM | POA: Diagnosis not present

## 2017-10-29 DIAGNOSIS — M86672 Other chronic osteomyelitis, left ankle and foot: Secondary | ICD-10-CM | POA: Diagnosis not present

## 2017-10-29 DIAGNOSIS — R262 Difficulty in walking, not elsewhere classified: Secondary | ICD-10-CM | POA: Diagnosis not present

## 2017-10-29 NOTE — Telephone Encounter (Signed)
Called patient to advise on message below.

## 2017-10-30 DIAGNOSIS — R262 Difficulty in walking, not elsewhere classified: Secondary | ICD-10-CM | POA: Diagnosis not present

## 2017-10-30 DIAGNOSIS — R2681 Unsteadiness on feet: Secondary | ICD-10-CM | POA: Diagnosis not present

## 2017-10-30 DIAGNOSIS — M86672 Other chronic osteomyelitis, left ankle and foot: Secondary | ICD-10-CM | POA: Diagnosis not present

## 2017-10-31 ENCOUNTER — Telehealth: Payer: Self-pay | Admitting: Cardiovascular Disease

## 2017-10-31 ENCOUNTER — Encounter: Payer: Self-pay | Admitting: Cardiovascular Disease

## 2017-10-31 ENCOUNTER — Ambulatory Visit (INDEPENDENT_AMBULATORY_CARE_PROVIDER_SITE_OTHER): Payer: Medicare Other | Admitting: Cardiovascular Disease

## 2017-10-31 VITALS — BP 126/62 | HR 66 | Ht 62.0 in | Wt 163.0 lb

## 2017-10-31 DIAGNOSIS — I214 Non-ST elevation (NSTEMI) myocardial infarction: Secondary | ICD-10-CM

## 2017-10-31 DIAGNOSIS — M86672 Other chronic osteomyelitis, left ankle and foot: Secondary | ICD-10-CM | POA: Diagnosis not present

## 2017-10-31 DIAGNOSIS — R262 Difficulty in walking, not elsewhere classified: Secondary | ICD-10-CM | POA: Diagnosis not present

## 2017-10-31 DIAGNOSIS — R2681 Unsteadiness on feet: Secondary | ICD-10-CM | POA: Diagnosis not present

## 2017-10-31 NOTE — Patient Instructions (Signed)
Medication Instructions:  Your physician recommends that you continue on your current medications as directed. Please refer to the Current Medication list given to you today.   Labwork: None Ordered   Testing/Procedures: None Ordered   Follow-Up: Your physician recommends that you schedule a follow-up appointment in: as needed with Dr. Nahser   If you need a refill on your cardiac medications before your next appointment, please call your pharmacy.   Thank you for choosing [facility]! Khristen Cheyney, RN [phone]    

## 2017-10-31 NOTE — Progress Notes (Signed)
Cardiology Office Note:    Date:  10/31/2017   ID:  Chelsea [name], DOB 05-08-1928, MRN [id number]  PCP:  Hendricks Limes, MD  Cardiologist:  Mertie Moores, MD  Electrophysiologist:  None   Referring MD: Jani Gravel, MD   Chief Complaint  Patient presents with  . Coronary Artery Disease    History of Present Illness:    Chelsea [name] is a 84 y.o. female with a hx of chronic osteomyelitis of the left foot, hypertension, diabetes mellitus and dementia.  I met her during hospitalization in April, 2019.  She had been admitted with shoulder pain and hypertension and was found to have a minimally elevated troponin level.  Echocardiogram revealed mildly to moderately reduced left ventricular systolic function with EF of 40 to 45%.  She had hypokinesis of the apical anteroseptal region.  There was grade 1 diastolic dysfunction. Troponin peaked at 5.87.  Left heart catheterization revealed mild coronary artery disease involving the LAD.  There was minimal disease of the left circumflex artery.   Thougt that this was a stress induced cardiomyopaty   Was admittd in Aug. With a dislocation of her left ankel Has has surgery for repair Is now at [facility] facility .   Is in a wheelchair.   Is not weight bearing yet.   Dr. Maudie Mercury is PCP     Past Medical History:  Diagnosis Date  . Anemia    takes Ferrous Sulfate daily  . Arthritis   . Basal cell carcinoma of breast 1998  . Bruises easily   . Dizziness   . DM (diabetes mellitus) ([place])    takes Metformin daily  . Eczema   . Emphysema lung ([place])   . GERD (gastroesophageal reflux disease)    takes Protonix daily  . Glaucoma    Bil  . History of hiatal hernia   . HTN (hypertension)    takes Diovan daily  . Hyperlipidemia   . ITP (idiopathic thrombocytopenic purpura) 2/09  . Myocardial infarction ([place])    N-stemi 2019  . Nocturia   . Osteoporosis   . Peripheral edema   . Stroke ([place]) 02/03/09   mid brain, diplopia     Past Surgical History:  Procedure Laterality Date  . ANKLE FUSION Left 09/26/2017   Procedure: LEFT TIB-CALC FUSION;  Surgeon: Newt Minion, MD;  Location: [place];  Service: Orthopedics;  Laterality: Left;  . BREAST LUMPECTOMY WITH RADIOACTIVE SEED LOCALIZATION Right 10/23/2014   Procedure: RIGHT BREAST LUMPECTOMY WITH RADIOACTIVE SEED LOCALIZATION;  Surgeon: Fanny Skates, MD;  Location: [place];  Service: General;  Laterality: Right;  . CATARACT EXTRACTION Bilateral   . COLONOSCOPY    . ESOPHAGOGASTRODUODENOSCOPY    . LEFT HEART CATH AND CORONARY ANGIOGRAPHY N/A 07/09/2017   Procedure: LEFT HEART CATH AND CORONARY ANGIOGRAPHY;  Surgeon: Jettie Booze, MD;  Location: [place] [facility];  Service: Cardiovascular;  Laterality: N/A;  . MYOMECTOMY N/A 03/14/1995   late 90's per pt  . PARATHYROIDECTOMY  2008  . REFRACTIVE SURGERY Bilateral   . TONSILLECTOMY  as a child    Current Medications: Current Meds  Medication Sig  . acetaminophen (TYLENOL) 325 MG tablet Take 650 mg by mouth every 4 (four) hours as needed for mild pain.  . Amino Acids-Protein Hydrolys (FEEDING SUPPLEMENT, PRO-STAT SUGAR FREE 64,) LIQD Take 30 mLs by mouth daily.  Marland Kitchen aspirin 81 MG chewable tablet Chew 81 mg by mouth daily after breakfast.   . bisacodyl (DULCOLAX)  10 MG suppository Place 10 mg rectally daily as needed for moderate constipation (If not relieved by MOM).   . carvedilol (COREG) 3.125 MG tablet Take 1 tablet (3.125 mg total) by mouth 2 (two) times daily with a meal.  . Cholecalciferol (VITAMIN D3) 1000 units CAPS Take 2,000 Units by mouth daily.   Marland Kitchen geriatric multivitamins-minerals (ELDERTONIC/GEVRABON) LIQD Take 15 mLs by mouth 2 (two) times daily.  Marland Kitchen HYDROcodone-acetaminophen (NORCO/VICODIN) 5-325 MG tablet Take 1 tablet by mouth every 6 (six) hours as needed for moderate pain.  . hydrOXYzine (ATARAX/VISTARIL) 10 MG tablet Take 10 mg by mouth every 6 (six) hours as needed for itching.  . insulin  glargine (LANTUS) 100 UNIT/ML injection Inject 5 Units into the skin at bedtime.   . isosorbide mononitrate (IMDUR) 30 MG 24 hr tablet Take 0.5 tablets (15 mg total) by mouth daily.  Marland Kitchen lactulose (CHRONULAC) 10 GM/15ML solution Take 30 g by mouth at bedtime.   Marland Kitchen losartan (COZAAR) 25 MG tablet Take 25 mg by mouth daily.  . magnesium hydroxide (MILK OF MAGNESIA) 400 MG/5ML suspension Take 30 mLs by mouth daily as needed for mild constipation (If no BM in 3 days).  . magnesium oxide (MAG-OX) 400 (241.3 Mg) MG tablet Take 1 tablet (400 mg total) by mouth 2 (two) times daily.  . Multiple Vitamins-Minerals (MULTIVITAMIN PO) Take 1 tablet by mouth daily.  . nitroGLYCERIN (NITROSTAT) 0.4 MG SL tablet Place 1 tablet (0.4 mg total) under the tongue every 5 (five) minutes x 3 doses as needed for chest pain.  . Omega-3 Fatty Acids (FISH OIL) 1000 MG CAPS Take 1,000 mg by mouth 2 (two) times daily.   . pantoprazole (PROTONIX) 40 MG tablet Take 40 mg by mouth daily after breakfast.   . polyethylene glycol (MIRALAX / GLYCOLAX) packet Take 17 g by mouth daily.  . sennosides-docusate sodium (SENOKOT-S) 8.6-50 MG tablet Take 2 tablets by mouth 2 (two) times daily.  . Sodium Phosphates (RA SALINE ENEMA) 19-7 GM/118ML ENEM Place 1 each rectally daily as needed (for constipation. If not relieved by Bisacodyl Suppository).      Allergies:   Travatan z [travoprost (bak free)]   Social History   Socioeconomic History  . Marital status: Divorced    Spouse name: Not on file  . Number of children: Not on file  . Years of education: Not on file  . Highest education level: Not on file  Occupational History  . Not on file  Social Needs  . Financial resource strain: Not on file  . Food insecurity:    Worry: Not on file    Inability: Not on file  . Transportation needs:    Medical: Not on file    Non-medical: Not on file  Tobacco Use  . Smoking status: Never Smoker  . Smokeless tobacco: Never Used  Substance  and Sexual Activity  . Alcohol use: No    Alcohol/week: 0.0 standard drinks  . Drug use: No  . Sexual activity: Not on file  Lifestyle  . Physical activity:    Days per week: Not on file    Minutes per session: Not on file  . Stress: Not on file  Relationships  . Social connections:    Talks on phone: Not on file    Gets together: Not on file    Attends religious service: Not on file    Active member of club or organization: Not on file    Attends meetings of clubs or organizations:  Not on file    Relationship status: Not on file  Other Topics Concern  . Not on file  Social History Narrative   Nonsmoker, nondrinker.  07/2017 resident of [facility] for short-term rehabilitation.     Family History: The patient's family history includes Breast cancer (age of onset: 27) in her mother; Colon cancer in her father; Diabetes in her father; Heart disease in her maternal grandfather; Multiple sclerosis in her daughter.  ROS:   Please see the history of present illness.     All other systems reviewed and are negative.  EKGs/Labs/Other Studies Reviewed:    The following studies were reviewed today:   EKG:  EKG is  ordered today.  The ekg ordered today demonstrates   Recent Labs: 05/14/2017: B Natriuretic Peptide 219.0 05/21/2017: ALT 60 05/29/2017: Magnesium 1.2 09/26/2017: BUN 23; Creatinine, Ser 0.88; Hemoglobin 14.3; Platelets 153; Potassium 4.0; Sodium 142  Recent Lipid Panel    Component Value Date/Time   CHOL 113 05/13/2017 0329   TRIG 28 05/13/2017 0329   HDL 52 05/13/2017 0329   CHOLHDL 2.2 05/13/2017 0329   VLDL 6 05/13/2017 0329   LDLCALC 55 05/13/2017 0329    Physical Exam:    VS:  BP (!) 162/62   Pulse 66   Ht '5\' 2"'  (1.575 m)   Wt 163 lb (73.9 kg)   SpO2 99%   BMI 29.81 kg/m     Wt Readings from Last 3 Encounters:  10/31/17 163 lb (73.9 kg)  10/24/17 163 lb 6.4 oz (74.1 kg)  10/18/17 159 lb 3.2 oz (72.2 kg)     GEN:  Well  nourished, well developed in no acute distress HEENT: Normal NECK: No JVD; No carotid bruits LYMPHATICS: No lymphadenopathy CARDIAC:RRR, no murmurs, rubs, gallops RESPIRATORY:  Clear to auscultation without rales, wheezing or rhonchi  ABDOMEN: Soft, non-tender, non-distended MUSCULOSKELETAL:  No edema; No deformity  SKIN: Warm and dry NEUROLOGIC:  Alert and oriented x 3 PSYCHIATRIC:  Normal affect   ASSESSMENT:    No diagnosis found. PLAN:    In order of problems listed above:  1. Stress-induced cardiomyopathy :  patient was admitted following a fall.  She had a troponin level of greater than 5.  Echocardiogram revealed anteroapical wall motion defects.  At heart catheterization she was found to have only mild coronary artery irregularities.  It was thought that this was just due to a stress-induced cardiomyopathy.  She is not having any further episodes of chest discomfort.  I would continue with her current medications.  She seems to be doing very well and does not need specific cardiac follow-up.  We will see her on an as-needed basis.    Medication Adjustments/Labs and Tests Ordered: Current medicines are reviewed at length with the patient today.  Concerns regarding medicines are outlined above.  No orders of the defined types were placed in this encounter.  No orders of the defined types were placed in this encounter.   There are no Patient Instructions on file for this visit.   Signed, Mertie Moores, MD  10/31/2017 11:11 AM    [place]

## 2017-10-31 NOTE — Telephone Encounter (Signed)
Called patient and advised her that I have located her paperwork from earlier today and that she is correct, her BP was 126/62 mmHg. I advised her that we have corrected her chart to show this and that [place] that took her BP today apologizes. Patient verbalized understanding and thanked [place] for our help.

## 2017-10-31 NOTE — Telephone Encounter (Signed)
New message  Patient was in the office for visit today and the patient states that on the discharge sheet it states that bp is 162/62 it should be 126/62. Please call to discuss.

## 2017-11-01 DIAGNOSIS — R2681 Unsteadiness on feet: Secondary | ICD-10-CM | POA: Diagnosis not present

## 2017-11-01 DIAGNOSIS — R262 Difficulty in walking, not elsewhere classified: Secondary | ICD-10-CM | POA: Diagnosis not present

## 2017-11-01 DIAGNOSIS — M86672 Other chronic osteomyelitis, left ankle and foot: Secondary | ICD-10-CM | POA: Diagnosis not present

## 2017-11-02 DIAGNOSIS — R262 Difficulty in walking, not elsewhere classified: Secondary | ICD-10-CM | POA: Diagnosis not present

## 2017-11-02 DIAGNOSIS — M86672 Other chronic osteomyelitis, left ankle and foot: Secondary | ICD-10-CM | POA: Diagnosis not present

## 2017-11-02 DIAGNOSIS — R2681 Unsteadiness on feet: Secondary | ICD-10-CM | POA: Diagnosis not present

## 2017-11-05 DIAGNOSIS — R2681 Unsteadiness on feet: Secondary | ICD-10-CM | POA: Diagnosis not present

## 2017-11-05 DIAGNOSIS — M86672 Other chronic osteomyelitis, left ankle and foot: Secondary | ICD-10-CM | POA: Diagnosis not present

## 2017-11-05 DIAGNOSIS — R262 Difficulty in walking, not elsewhere classified: Secondary | ICD-10-CM | POA: Diagnosis not present

## 2017-11-06 DIAGNOSIS — R262 Difficulty in walking, not elsewhere classified: Secondary | ICD-10-CM | POA: Diagnosis not present

## 2017-11-06 DIAGNOSIS — R2681 Unsteadiness on feet: Secondary | ICD-10-CM | POA: Diagnosis not present

## 2017-11-06 DIAGNOSIS — M6281 Muscle weakness (generalized): Secondary | ICD-10-CM | POA: Diagnosis not present

## 2017-11-06 DIAGNOSIS — M86672 Other chronic osteomyelitis, left ankle and foot: Secondary | ICD-10-CM | POA: Diagnosis not present

## 2017-11-07 DIAGNOSIS — M86672 Other chronic osteomyelitis, left ankle and foot: Secondary | ICD-10-CM | POA: Diagnosis not present

## 2017-11-07 DIAGNOSIS — R2681 Unsteadiness on feet: Secondary | ICD-10-CM | POA: Diagnosis not present

## 2017-11-07 DIAGNOSIS — R262 Difficulty in walking, not elsewhere classified: Secondary | ICD-10-CM | POA: Diagnosis not present

## 2017-11-07 DIAGNOSIS — M6281 Muscle weakness (generalized): Secondary | ICD-10-CM | POA: Diagnosis not present

## 2017-11-08 DIAGNOSIS — R262 Difficulty in walking, not elsewhere classified: Secondary | ICD-10-CM | POA: Diagnosis not present

## 2017-11-08 DIAGNOSIS — H402213 Chronic angle-closure glaucoma, right eye, severe stage: Secondary | ICD-10-CM | POA: Diagnosis not present

## 2017-11-08 DIAGNOSIS — M86672 Other chronic osteomyelitis, left ankle and foot: Secondary | ICD-10-CM | POA: Diagnosis not present

## 2017-11-08 DIAGNOSIS — R2681 Unsteadiness on feet: Secondary | ICD-10-CM | POA: Diagnosis not present

## 2017-11-08 DIAGNOSIS — M6281 Muscle weakness (generalized): Secondary | ICD-10-CM | POA: Diagnosis not present

## 2017-11-08 DIAGNOSIS — E119 Type 2 diabetes mellitus without complications: Secondary | ICD-10-CM | POA: Diagnosis not present

## 2017-11-08 DIAGNOSIS — H402212 Chronic angle-closure glaucoma, right eye, moderate stage: Secondary | ICD-10-CM | POA: Diagnosis not present

## 2017-11-08 DIAGNOSIS — H35363 Drusen (degenerative) of macula, bilateral: Secondary | ICD-10-CM | POA: Diagnosis not present

## 2017-11-09 DIAGNOSIS — M86672 Other chronic osteomyelitis, left ankle and foot: Secondary | ICD-10-CM | POA: Diagnosis not present

## 2017-11-09 DIAGNOSIS — R2681 Unsteadiness on feet: Secondary | ICD-10-CM | POA: Diagnosis not present

## 2017-11-09 DIAGNOSIS — R262 Difficulty in walking, not elsewhere classified: Secondary | ICD-10-CM | POA: Diagnosis not present

## 2017-11-09 DIAGNOSIS — M6281 Muscle weakness (generalized): Secondary | ICD-10-CM | POA: Diagnosis not present

## 2017-11-12 DIAGNOSIS — M86672 Other chronic osteomyelitis, left ankle and foot: Secondary | ICD-10-CM | POA: Diagnosis not present

## 2017-11-12 DIAGNOSIS — R2681 Unsteadiness on feet: Secondary | ICD-10-CM | POA: Diagnosis not present

## 2017-11-12 DIAGNOSIS — R262 Difficulty in walking, not elsewhere classified: Secondary | ICD-10-CM | POA: Diagnosis not present

## 2017-11-12 DIAGNOSIS — M6281 Muscle weakness (generalized): Secondary | ICD-10-CM | POA: Diagnosis not present

## 2017-11-12 LAB — HM DIABETES EYE EXAM

## 2017-11-13 DIAGNOSIS — M6281 Muscle weakness (generalized): Secondary | ICD-10-CM | POA: Diagnosis not present

## 2017-11-13 DIAGNOSIS — M86672 Other chronic osteomyelitis, left ankle and foot: Secondary | ICD-10-CM | POA: Diagnosis not present

## 2017-11-13 DIAGNOSIS — R262 Difficulty in walking, not elsewhere classified: Secondary | ICD-10-CM | POA: Diagnosis not present

## 2017-11-13 DIAGNOSIS — R2681 Unsteadiness on feet: Secondary | ICD-10-CM | POA: Diagnosis not present

## 2017-11-14 DIAGNOSIS — R262 Difficulty in walking, not elsewhere classified: Secondary | ICD-10-CM | POA: Diagnosis not present

## 2017-11-14 DIAGNOSIS — M6281 Muscle weakness (generalized): Secondary | ICD-10-CM | POA: Diagnosis not present

## 2017-11-14 DIAGNOSIS — R2681 Unsteadiness on feet: Secondary | ICD-10-CM | POA: Diagnosis not present

## 2017-11-14 DIAGNOSIS — M86672 Other chronic osteomyelitis, left ankle and foot: Secondary | ICD-10-CM | POA: Diagnosis not present

## 2017-11-15 ENCOUNTER — Encounter (INDEPENDENT_AMBULATORY_CARE_PROVIDER_SITE_OTHER): Payer: Self-pay | Admitting: Orthopedic Surgery

## 2017-11-15 ENCOUNTER — Ambulatory Visit (INDEPENDENT_AMBULATORY_CARE_PROVIDER_SITE_OTHER): Payer: Medicare Other

## 2017-11-15 ENCOUNTER — Ambulatory Visit (INDEPENDENT_AMBULATORY_CARE_PROVIDER_SITE_OTHER): Payer: Medicare Other | Admitting: Orthopedic Surgery

## 2017-11-15 VITALS — Ht 62.0 in | Wt 163.0 lb

## 2017-11-15 DIAGNOSIS — E1142 Type 2 diabetes mellitus with diabetic polyneuropathy: Secondary | ICD-10-CM

## 2017-11-15 DIAGNOSIS — R262 Difficulty in walking, not elsewhere classified: Secondary | ICD-10-CM | POA: Diagnosis not present

## 2017-11-15 DIAGNOSIS — M86672 Other chronic osteomyelitis, left ankle and foot: Secondary | ICD-10-CM | POA: Diagnosis not present

## 2017-11-15 DIAGNOSIS — M6281 Muscle weakness (generalized): Secondary | ICD-10-CM | POA: Diagnosis not present

## 2017-11-15 DIAGNOSIS — Z981 Arthrodesis status: Secondary | ICD-10-CM

## 2017-11-15 DIAGNOSIS — R2681 Unsteadiness on feet: Secondary | ICD-10-CM | POA: Diagnosis not present

## 2017-11-15 NOTE — Progress Notes (Signed)
Office Visit Note   Patient: Chelsea [name]           Date of Birth: [birth year]           MRN: [id number] Visit Date: 11/15/2017              Requested by: Hendricks Limes, [place], [place] [address] PCP: Jani Gravel, MD  Chief Complaint  Patient presents with  . Left Foot - Routine Post Op      HPI: Patient is a 84 year old female who is here with her daughter for postoperative follow-up following a left ankle fusion on 09/26/2017.  She had a chronic left ankle fracture dislocation. She is been residing at [facility] and is working with physical therapy and doing some standing on the right leg but nonweightbearing thus far on the left leg.  She reports no pain over the left ankle now.  She has been utilizing an Ace wrap for compression and her edema is much improved.  She did not do well with a compression stocking.   Assessment & Plan: Visit Diagnoses:  1. S/P ankle fusion   2. Diabetic polyneuropathy associated with type 2 diabetes mellitus ([place])     Plan: Counseled patient that her x-rays are looking good with early callus formation in good position and alignment.  She may begin weightbearing as tolerated in her walker boot with a walker.  She can continue to take the boot off when not up walking.  She will continue to utilize a Ace wrap for compression.  She will follow-up in 4 weeks.  Orders were sent to [facility] skilled nursing facility for the above.  Follow-Up Instructions: Return in about 4 weeks (around 12/13/2017).   Ortho Exam  Patient is alert, oriented, no adenopathy, well-dressed, normal affect, normal respiratory effort. The left ankle incisions are healing well.  Her edema is markedly improved.  She has good pedal pulses.  Ankle wounds position at 90 degrees.  Imaging: Xr Ankle 2 Views Left  Result Date: 11/15/2017 X-rays of the left ankle show good position and alignment of the hardware with early callus formation.   X-rays were reviewed with Dr. Sharol Given  No images are attached to the encounter.  Labs: Lab Results  Component Value Date   HGBA1C 7.1 (A) 09/21/2017   HGBA1C 6.8 (H) 04/17/2017   HGBA1C 7.2 (H) 10/20/2014   ESRSEDRATE 9 04/17/2017   ESRSEDRATE 11 02/04/2009   CRP 3.4 04/17/2017   CRP 0.8 (H) 02/04/2009   REPTSTATUS 05/18/2017 FINAL 05/12/2017   GRAMSTAIN  03/08/2017    NO WBC SEEN FEW GRAM NEGATIVE RODS FEW GRAM POSITIVE COCCI IN PAIRS RARE GRAM POSITIVE RODS Performed at [facility], [place] [address] [place], [place] [address]    CULT  05/12/2017    NO GROWTH 5 DAYS Performed at [facility] [address] [place],  [address]    [place] 03/08/2017   LABORGA ENTEROCOCCUS FAECALIS 03/08/2017     Lab Results  Component Value Date   ALBUMIN 2.2 (L) 05/29/2017   ALBUMIN 2.2 (L) 05/28/2017   ALBUMIN 2.2 (L) 05/27/2017    Body mass index is 29.81 kg/m.  Orders:  Orders Placed This Encounter  Procedures  . XR Ankle 2 Views Left   No orders of the defined types were placed in this encounter.    Procedures: No procedures performed  Clinical Data: No additional findings.  ROS:  All  other systems negative, except as noted in the HPI. Review of Systems  Objective: Vital Signs: Ht 5\' 2"  (1.575 m)   Wt 163 lb (73.9 kg)   BMI 29.81 kg/m   Specialty Comments:  No specialty comments available.  PMFS History: Patient Active Problem List   Diagnosis Date Noted  . Closed fracture dislocation of left ankle joint   . Pressure injury of skin 05/16/2017  . Acute kidney failure ([place])   . Acute on chronic combined systolic and diastolic CHF (congestive heart failure) ([place])   . NSTEMI (non-ST elevated myocardial infarction) ([place]) 05/13/2017  . HTN (hypertension) 05/13/2017  . Diabetes mellitus ([place]) 04/10/2017  . Venous stasis dermatitis of both lower extremities 04/10/2017  . Neuropathy 04/10/2017  . Chronic osteomyelitis of  left foot ([place]) 04/10/2017  . Breast cancer of upper-inner quadrant of right female breast ([place]) 10/05/2014  . Occult blood positive stool 03/24/2014  . History of ITP 04/14/2011   Past Medical History:  Diagnosis Date  . Anemia    takes Ferrous Sulfate daily  . Arthritis   . Basal cell carcinoma of breast 1998  . Bruises easily   . Dizziness   . DM (diabetes mellitus) ([place])    takes Metformin daily  . Eczema   . Emphysema lung ([place])   . GERD (gastroesophageal reflux disease)    takes Protonix daily  . Glaucoma    Bil  . History of hiatal hernia   . HTN (hypertension)    takes Diovan daily  . Hyperlipidemia   . ITP (idiopathic thrombocytopenic purpura) 2/09  . Myocardial infarction ([place])    N-stemi 2019  . Nocturia   . Osteoporosis   . Peripheral edema   . Stroke ([place]) 02/03/09   mid brain, diplopia    Family History  Problem Relation Age of Onset  . Breast cancer Mother 71  . Colon cancer Father   . Diabetes Father        questionable  . Multiple sclerosis Daughter   . Heart disease Maternal Grandfather     Past Surgical History:  Procedure Laterality Date  . ANKLE FUSION Left 09/26/2017   Procedure: LEFT TIB-CALC FUSION;  Surgeon: Newt Minion, MD;  Location: [facility];  Service: Orthopedics;  Laterality: Left;  . BREAST LUMPECTOMY WITH RADIOACTIVE SEED LOCALIZATION Right 10/23/2014   Procedure: RIGHT BREAST LUMPECTOMY WITH RADIOACTIVE SEED LOCALIZATION;  Surgeon: Fanny Skates, MD;  Location: [facility];  Service: General;  Laterality: Right;  . CATARACT EXTRACTION Bilateral   . COLONOSCOPY    . ESOPHAGOGASTRODUODENOSCOPY    . LEFT HEART CATH AND CORONARY ANGIOGRAPHY N/A 07/09/2017   Procedure: LEFT HEART CATH AND CORONARY ANGIOGRAPHY;  Surgeon: Jettie Booze, MD;  Location: [facility];  Service: Cardiovascular;  Laterality: N/A;  . MYOMECTOMY N/A 03/14/1995   late 90's per pt  . PARATHYROIDECTOMY  2008  . REFRACTIVE SURGERY Bilateral   . TONSILLECTOMY   as a child   Social History   Occupational History  . Not on file  Tobacco Use  . Smoking status: Never Smoker  . Smokeless tobacco: Never Used  Substance and Sexual Activity  . Alcohol use: No    Alcohol/week: 0.0 standard drinks  . Drug use: No  . Sexual activity: Not on file

## 2017-11-16 DIAGNOSIS — M86672 Other chronic osteomyelitis, left ankle and foot: Secondary | ICD-10-CM | POA: Diagnosis not present

## 2017-11-16 DIAGNOSIS — R262 Difficulty in walking, not elsewhere classified: Secondary | ICD-10-CM | POA: Diagnosis not present

## 2017-11-16 DIAGNOSIS — R2681 Unsteadiness on feet: Secondary | ICD-10-CM | POA: Diagnosis not present

## 2017-11-16 DIAGNOSIS — M6281 Muscle weakness (generalized): Secondary | ICD-10-CM | POA: Diagnosis not present

## 2017-11-19 DIAGNOSIS — M6281 Muscle weakness (generalized): Secondary | ICD-10-CM | POA: Diagnosis not present

## 2017-11-19 DIAGNOSIS — R262 Difficulty in walking, not elsewhere classified: Secondary | ICD-10-CM | POA: Diagnosis not present

## 2017-11-19 DIAGNOSIS — M86672 Other chronic osteomyelitis, left ankle and foot: Secondary | ICD-10-CM | POA: Diagnosis not present

## 2017-11-19 DIAGNOSIS — R2681 Unsteadiness on feet: Secondary | ICD-10-CM | POA: Diagnosis not present

## 2017-11-20 DIAGNOSIS — R262 Difficulty in walking, not elsewhere classified: Secondary | ICD-10-CM | POA: Diagnosis not present

## 2017-11-20 DIAGNOSIS — M86672 Other chronic osteomyelitis, left ankle and foot: Secondary | ICD-10-CM | POA: Diagnosis not present

## 2017-11-20 DIAGNOSIS — R2681 Unsteadiness on feet: Secondary | ICD-10-CM | POA: Diagnosis not present

## 2017-11-20 DIAGNOSIS — M6281 Muscle weakness (generalized): Secondary | ICD-10-CM | POA: Diagnosis not present

## 2017-11-21 DIAGNOSIS — M6281 Muscle weakness (generalized): Secondary | ICD-10-CM | POA: Diagnosis not present

## 2017-11-21 DIAGNOSIS — M86672 Other chronic osteomyelitis, left ankle and foot: Secondary | ICD-10-CM | POA: Diagnosis not present

## 2017-11-21 DIAGNOSIS — R262 Difficulty in walking, not elsewhere classified: Secondary | ICD-10-CM | POA: Diagnosis not present

## 2017-11-21 DIAGNOSIS — R2681 Unsteadiness on feet: Secondary | ICD-10-CM | POA: Diagnosis not present

## 2017-11-22 ENCOUNTER — Non-Acute Institutional Stay (SKILLED_NURSING_FACILITY): Payer: Medicare Other

## 2017-11-22 DIAGNOSIS — Z0000 Encounter for general adult medical examination without abnormal findings: Secondary | ICD-10-CM

## 2017-11-22 DIAGNOSIS — M86672 Other chronic osteomyelitis, left ankle and foot: Secondary | ICD-10-CM | POA: Diagnosis not present

## 2017-11-22 DIAGNOSIS — R262 Difficulty in walking, not elsewhere classified: Secondary | ICD-10-CM | POA: Diagnosis not present

## 2017-11-22 DIAGNOSIS — M6281 Muscle weakness (generalized): Secondary | ICD-10-CM | POA: Diagnosis not present

## 2017-11-22 DIAGNOSIS — R2681 Unsteadiness on feet: Secondary | ICD-10-CM | POA: Diagnosis not present

## 2017-11-22 NOTE — Patient Instructions (Addendum)
Chelsea [name] , Thank you for taking time to come for your Medicare Wellness Visit. I appreciate your ongoing commitment to your health goals. Please review the following plan we discussed and let me know if I can assist you in the future.   Screening recommendations/referrals: Colonoscopy excluded, over age 82 Mammogram excluded, over age 38 Bone Density up to date Recommended yearly ophthalmology/optometry visit for glaucoma screening and checkup Recommended yearly dental visit for hygiene and checkup  Vaccinations: Influenza vaccine due, will receive at [facility] Pneumococcal vaccine up to date, completed Tdap vaccine due, ordered Shingles vaccine not in past records    Advanced directives: in chart  Conditions/risks identified: none  Next appointment: Dr. Linna Darner makes rounds   Preventive Care 82 Years and Older, Female Preventive care refers to lifestyle choices and visits with your health care provider that can promote health and wellness. What does preventive care include?  A yearly physical exam. This is also called an annual well check.  Dental exams once or twice a year.  Routine eye exams. Ask your health care provider how often you should have your eyes checked.  Personal lifestyle choices, including:  Daily care of your teeth and gums.  Regular physical activity.  Eating a healthy diet.  Avoiding tobacco and drug use.  Limiting alcohol use.  Practicing safe sex.  Taking low-dose aspirin every day.  Taking vitamin and mineral supplements as recommended by your health care provider. What happens during an annual well check? The services and screenings done by your health care provider during your annual well check will depend on your age, overall health, lifestyle risk factors, and family history of disease. Counseling  Your health care provider may ask you questions about your:  Alcohol use.  Tobacco use.  Drug use.  Emotional well-being.  Home  and relationship well-being.  Sexual activity.  Eating habits.  History of falls.  Memory and ability to understand (cognition).  Work and work Statistician.  Reproductive health. Screening  You may have the following tests or measurements:  Height, weight, and BMI.  Blood pressure.  Lipid and cholesterol levels. These may be checked every 5 years, or more frequently if you are over 22 years old.  Skin check.  Lung cancer screening. You may have this screening every year starting at age 82 if you have a 30-pack-year history of smoking and currently smoke or have quit within the past 15 years.  Fecal occult blood test (FOBT) of the stool. You may have this test every year starting at age 82.  Flexible sigmoidoscopy or colonoscopy. You may have a sigmoidoscopy every 5 years or a colonoscopy every 10 years starting at age 82.  Hepatitis C blood test.  Hepatitis B blood test.  Sexually transmitted disease (STD) testing.  Diabetes screening. This is done by checking your blood sugar (glucose) after you have not eaten for a while (fasting). You may have this done every 1-3 years.  Bone density scan. This is done to screen for osteoporosis. You may have this done starting at age 82.  Mammogram. This may be done every 1-2 years. Talk to your health care provider about how often you should have regular mammograms. Talk with your health care provider about your test results, treatment options, and if necessary, the need for more tests. Vaccines  Your health care provider may recommend certain vaccines, such as:  Influenza vaccine. This is recommended every year.  Tetanus, diphtheria, and acellular pertussis (Tdap, Td) vaccine. You may need  a Td booster every 10 years.  Zoster vaccine. You may need this after age 74.  Pneumococcal 13-valent conjugate (PCV13) vaccine. One dose is recommended after age 82.  Pneumococcal polysaccharide (PPSV23) vaccine. One dose is recommended  after age 82. Talk to your health care provider about which screenings and vaccines you need and how often you need them. This information is not intended to replace advice given to you by your health care provider. Make sure you discuss any questions you have with your health care provider. Document Released: 02/19/2015 Document Revised: 10/13/2015 Document Reviewed: 11/24/2014 Elsevier Interactive Patient Education  2017 [place] Prevention in the Home Falls can cause injuries. They can happen to people of all ages. There are many things you can do to make your home safe and to help prevent falls. What can I do on the outside of my home?  Regularly fix the edges of walkways and driveways and fix any cracks.  Remove anything that might make you trip as you walk through a door, such as a raised step or threshold.  Trim any bushes or trees on the path to your home.  Use bright outdoor lighting.  Clear any walking paths of anything that might make someone trip, such as rocks or tools.  Regularly check to see if handrails are loose or broken. Make sure that both sides of any steps have handrails.  Any raised decks and porches should have guardrails on the edges.  Have any leaves, snow, or ice cleared regularly.  Use sand or salt on walking paths during winter.  Clean up any spills in your garage right away. This includes oil or grease spills. What can I do in the bathroom?  Use night lights.  Install grab bars by the toilet and in the tub and shower. Do not use towel bars as grab bars.  Use non-skid mats or decals in the tub or shower.  If you need to sit down in the shower, use a plastic, non-slip stool.  Keep the floor dry. Clean up any water that spills on the floor as soon as it happens.  Remove soap buildup in the tub or shower regularly.  Attach bath mats securely with double-sided non-slip rug tape.  Do not have throw rugs and other things on the floor  that can make you trip. What can I do in the bedroom?  Use night lights.  Make sure that you have a light by your bed that is easy to reach.  Do not use any sheets or blankets that are too big for your bed. They should not hang down onto the floor.  Have a firm chair that has side arms. You can use this for support while you get dressed.  Do not have throw rugs and other things on the floor that can make you trip. What can I do in the kitchen?  Clean up any spills right away.  Avoid walking on wet floors.  Keep items that you use a lot in easy-to-reach places.  If you need to reach something above you, use a strong step stool that has a grab bar.  Keep electrical cords out of the way.  Do not use floor polish or wax that makes floors slippery. If you must use wax, use non-skid floor wax.  Do not have throw rugs and other things on the floor that can make you trip. What can I do with my stairs?  Do not leave any items on the  stairs.  Make sure that there are handrails on both sides of the stairs and use them. Fix handrails that are broken or loose. Make sure that handrails are as long as the stairways.  Check any carpeting to make sure that it is firmly attached to the stairs. Fix any carpet that is loose or worn.  Avoid having throw rugs at the top or bottom of the stairs. If you do have throw rugs, attach them to the floor with carpet tape.  Make sure that you have a light switch at the top of the stairs and the bottom of the stairs. If you do not have them, ask someone to add them for you. What else can I do to help prevent falls?  Wear shoes that:  Do not have high heels.  Have rubber bottoms.  Are comfortable and fit you well.  Are closed at the toe. Do not wear sandals.  If you use a stepladder:  Make sure that it is fully opened. Do not climb a closed stepladder.  Make sure that both sides of the stepladder are locked into place.  Ask someone to hold it  for you, if possible.  Clearly mark and make sure that you can see:  Any grab bars or handrails.  First and last steps.  Where the edge of each step is.  Use tools that help you move around (mobility aids) if they are needed. These include:  Canes.  Walkers.  Scooters.  Crutches.  Turn on the lights when you go into a dark area. Replace any light bulbs as soon as they burn out.  Set up your furniture so you have a clear path. Avoid moving your furniture around.  If any of your floors are uneven, fix them.  If there are any pets around you, be aware of where they are.  Review your medicines with your doctor. Some medicines can make you feel dizzy. This can increase your chance of falling. Ask your doctor what other things that you can do to help prevent falls. This information is not intended to replace advice given to you by your health care provider. Make sure you discuss any questions you have with your health care provider. Document Released: 11/19/2008 Document Revised: 07/01/2015 Document Reviewed: 02/27/2014 Elsevier Interactive Patient Education  2017 [employer]

## 2017-11-22 NOTE — Progress Notes (Addendum)
Subjective:   Chelsea [name] is a 84 y.o. female who presents for Medicare Annual (Subsequent) preventive examination at [facility]  Last AWV-08/23/2015    Objective:     Vitals: BP 124/64 (BP Location: Left Arm, Patient Position: Supine)   Pulse 68   Temp 98.6 F (37 C) (Oral)   Ht 5\' 2"  (1.575 m)   Wt 163 lb (73.9 kg)   BMI 29.81 kg/m   Body mass index is 29.81 kg/m.  Advanced Directives 11/22/2017 09/26/2017 07/09/2017 05/31/2017 05/30/2017 05/16/2017 05/22/2016  Does Patient Have a Medical Advance Directive? No Yes Yes Yes Yes No Yes  Type of Advance Directive - [place];Living will [place];Living will Out of facility DNR (pink MOST or yellow form) Out of facility DNR (pink MOST or yellow form) - -  Does patient want to make changes to medical advance directive? - No - Patient declined No - Patient declined No - Patient declined No - Patient declined - -  Copy of [place] in Chart? - No - copy requested No - copy requested - - - -  Would patient like information on creating a medical advance directive? No - Patient declined - - No - Patient declined No - Patient declined No - Patient declined -    Tobacco Social History   Tobacco Use  Smoking Status Never Smoker  Smokeless Tobacco Never Used     Counseling given: Not Answered   Clinical Intake:  Pre-visit preparation completed: No  Pain : No/denies pain     Diabetes: Yes CBG done?: No Did pt. bring in CBG monitor from home?: No  How often do you need to have someone help you when you read instructions, pamphlets, or other written materials from your doctor or pharmacy?: 2 - Rarely What is the last grade level you completed in school?: college  Interpreter Needed?: No  Information entered by :: Tyson Dense, RN  Past Medical History:  Diagnosis Date  . Anemia    takes Ferrous Sulfate daily  . Arthritis   . Basal cell carcinoma of  breast 1998  . Bruises easily   . Dizziness   . DM (diabetes mellitus) ([place])    takes Metformin daily  . Eczema   . Emphysema lung ([place])   . GERD (gastroesophageal reflux disease)    takes Protonix daily  . Glaucoma    Bil  . History of hiatal hernia   . HTN (hypertension)    takes Diovan daily  . Hyperlipidemia   . ITP (idiopathic thrombocytopenic purpura) 2/09  . Myocardial infarction ([place])    N-stemi 2019  . Nocturia   . Osteoporosis   . Peripheral edema   . Stroke ([place]) 02/03/09   mid brain, diplopia   Past Surgical History:  Procedure Laterality Date  . ANKLE FUSION Left 09/26/2017   Procedure: LEFT TIB-CALC FUSION;  Surgeon: Newt Minion, MD;  Location: [facility];  Service: Orthopedics;  Laterality: Left;  . BREAST LUMPECTOMY WITH RADIOACTIVE SEED LOCALIZATION Right 10/23/2014   Procedure: RIGHT BREAST LUMPECTOMY WITH RADIOACTIVE SEED LOCALIZATION;  Surgeon: Fanny Skates, MD;  Location: [facility];  Service: General;  Laterality: Right;  . CATARACT EXTRACTION Bilateral   . COLONOSCOPY    . ESOPHAGOGASTRODUODENOSCOPY    . LEFT HEART CATH AND CORONARY ANGIOGRAPHY N/A 07/09/2017   Procedure: LEFT HEART CATH AND CORONARY ANGIOGRAPHY;  Surgeon: Jettie Booze, MD;  Location: [facility];  Service: Cardiovascular;  Laterality: N/A;  . MYOMECTOMY N/A 03/14/1995   late 90's per pt  . PARATHYROIDECTOMY  2008  . REFRACTIVE SURGERY Bilateral   . TONSILLECTOMY  as a child   Family History  Problem Relation Age of Onset  . Breast cancer Mother 28  . Colon cancer Father   . Diabetes Father        questionable  . Multiple sclerosis Daughter   . Heart disease Maternal Grandfather    Social History   Socioeconomic History  . Marital status: Divorced    Spouse name: Not on file  . Number of children: Not on file  . Years of education: Not on file  . Highest education level: Not on file  Occupational History  . Not on file  Social Needs  . Financial resource strain:  Not hard at all  . Food insecurity:    Worry: Never true    Inability: Never true  . Transportation needs:    Medical: No    Non-medical: No  Tobacco Use  . Smoking status: Never Smoker  . Smokeless tobacco: Never Used  Substance and Sexual Activity  . Alcohol use: No    Alcohol/week: 0.0 standard drinks  . Drug use: No  . Sexual activity: Not on file  Lifestyle  . Physical activity:    Days per week: 5 days    Minutes per session: 30 min  . Stress: Only a little  Relationships  . Social connections:    Talks on phone: More than three times a week    Gets together: Once a week    Attends religious service: Never    Active member of club or organization: No    Attends meetings of clubs or organizations: Never    Relationship status: Divorced  Other Topics Concern  . Not on file  Social History Narrative   Nonsmoker, nondrinker.  07/2017 resident of [facility] for [facility]term rehabilitation.    Outpatient Encounter Medications as of 11/22/2017  Medication Sig  . acetaminophen (TYLENOL) 325 MG tablet Take 650 mg by mouth every 4 (four) hours as needed for mild pain.  . Amino Acids-Protein Hydrolys (FEEDING SUPPLEMENT, PRO-STAT SUGAR FREE 64,) LIQD Take 30 mLs by mouth daily.  Marland Kitchen aspirin 81 MG chewable tablet Chew 81 mg by mouth daily after breakfast.   . bisacodyl (DULCOLAX) 10 MG suppository Place 10 mg rectally daily as needed for moderate constipation (If not relieved by MOM).   . carvedilol (COREG) 3.125 MG tablet Take 1 tablet (3.125 mg total) by mouth 2 (two) times daily with a meal.  . Cholecalciferol (VITAMIN D3) 1000 units CAPS Take 2,000 Units by mouth daily.   Marland Kitchen geriatric multivitamins-minerals (ELDERTONIC/GEVRABON) LIQD Take 15 mLs by mouth 2 (two) times daily.  Marland Kitchen HYDROcodone-acetaminophen (NORCO/VICODIN) 5-325 MG tablet Take 1 tablet by mouth every 6 (six) hours as needed for moderate pain.  . hydrOXYzine (ATARAX/VISTARIL) 10 MG tablet Take  10 mg by mouth every 6 (six) hours as needed for itching.  . insulin glargine (LANTUS) 100 UNIT/ML injection Inject 5 Units into the skin at bedtime.   . isosorbide mononitrate (IMDUR) 30 MG 24 hr tablet Take 0.5 tablets (15 mg total) by mouth daily.  Marland Kitchen lactulose (CHRONULAC) 10 GM/15ML solution Take 30 g by mouth at bedtime.   Marland Kitchen losartan (COZAAR) 25 MG tablet Take 25 mg by mouth daily.  . magnesium hydroxide (MILK OF MAGNESIA) 400 MG/5ML suspension Take 30 mLs by mouth daily  as needed for mild constipation (If no BM in 3 days).  . magnesium oxide (MAG-OX) 400 (241.3 Mg) MG tablet Take 1 tablet (400 mg total) by mouth 2 (two) times daily.  . Multiple Vitamins-Minerals (MULTIVITAMIN PO) Take 1 tablet by mouth daily.  . nitroGLYCERIN (NITROSTAT) 0.4 MG SL tablet Place 1 tablet (0.4 mg total) under the tongue every 5 (five) minutes x 3 doses as needed for chest pain.  . Omega-3 Fatty Acids (FISH OIL) 1000 MG CAPS Take 1,000 mg by mouth 2 (two) times daily.   . pantoprazole (PROTONIX) 40 MG tablet Take 40 mg by mouth daily after breakfast.   . polyethylene glycol (MIRALAX / GLYCOLAX) packet Take 17 g by mouth daily.  . sennosides-docusate sodium (SENOKOT-S) 8.6-50 MG tablet Take 2 tablets by mouth 2 (two) times daily.  . Sodium Phosphates (RA SALINE ENEMA) 19-7 GM/118ML ENEM Place 1 each rectally daily as needed (for constipation. If not relieved by Bisacodyl Suppository).    No facility-administered encounter medications on file as of 11/22/2017.     Activities of Daily Living In your present state of health, do you have any difficulty performing the following activities: 11/22/2017 09/26/2017  Hearing? N N  Vision? N N  Difficulty concentrating or making decisions? N N  Walking or climbing stairs? Y Y  Dressing or bathing? Y Y  Doing errands, shopping? Y -  Conservation officer, nature and eating ? N -  Using the Toilet? N -  In the past six months, have you accidently leaked urine? Y -  Do you have  problems with loss of bowel control? Y -  Managing your Medications? Y -  Managing your Finances? Y -  Housekeeping or managing your Housekeeping? Y -  Some recent data might be hidden    Patient Care Team: Hendricks Limes, MD as PCP - General (Internal Medicine) Nahser, Wonda Cheng, MD as PCP - Cardiology (Cardiology) Thressa Sheller, MD as Consulting Physician (Internal Medicine) Fanny Skates, MD as Consulting Physician (General Surgery) Truitt Merle, MD as Consulting Physician (Hematology) Thea Silversmith, MD as Consulting Physician (Radiation Oncology) Mauro Kaufmann, RN as Registered Nurse Rockwell Germany, RN as Registered Nurse Jake Shark, Johny Blamer, NP as Nurse Practitioner (Nurse Practitioner) Nickola Major, NP as Nurse Practitioner (Internal Medicine)    Assessment:   This is a routine wellness examination for Chelsea [name].  Exercise Activities and Dietary recommendations Current Exercise Habits: Structured exercise class, Type of exercise: Other - see comments(therapy), Time (Minutes): 30, Frequency (Times/Week): 5, Weekly Exercise (Minutes/Week): 150, Intensity: Mild, Exercise limited by: orthopedic condition(s)  Goals   None     Fall Risk Fall Risk  11/22/2017 09/10/2017 07/17/2017 06/14/2017 05/10/2017  Falls in the past year? No Yes Yes Yes Yes  Number falls in past yr: - 1 1 - 1  Injury with Fall? - Yes Yes Yes No  Comment - - - - "fall in hallway at home"  Risk for fall due to : - History of fall(s) - - Impaired balance/gait   Is the patient's home free of loose throw rugs in walkways, pet beds, electrical cords, etc?   yes      Grab bars in the bathroom? yes      Handrails on the stairs?   yes      Adequate lighting?   yes  Depression Screen PHQ 2/9 Scores 11/22/2017 09/10/2017 07/17/2017 04/19/2017  PHQ - 2 Score 0 0 0 0     Cognitive Function  6CIT Screen 11/22/2017  What Year? 0 points  What month? 0 points  What time? 0 points  Count back  from 20 0 points  Months in reverse 0 points  Repeat phrase 4 points  Total Score 4    Immunization History  Administered Date(s) Administered  . Influenza Split 01/14/2011  . Influenza Whole 01/27/2010  . Influenza-Unspecified 01/09/2011, 11/06/2016  . Pneumococcal Polysaccharide-23 07/04/2007  . Pneumococcal-Unspecified 06/26/2017    Qualifies for Shingles Vaccine? Not in past records  Screening Tests Health Maintenance  Topic Date Due  . FOOT EXAM  09/02/1940  . INFLUENZA VACCINE  09/06/2017  . TETANUS/TDAP  06/07/2018 (Originally [birth year]1949)  . HEMOGLOBIN A1C  03/24/2018  . PNA vac Low Risk Adult (2 of 2 - PCV13) 06/27/2018  . OPHTHALMOLOGY EXAM  11/13/2018  . DEXA SCAN  Completed    Cancer Screenings: Lung: Low Dose CT Chest recommended if Age 85-80 years, 30 pack-year currently smoking OR have quit w/in 15years. Patient does not qualify. Breast:  Up to date on Mammogram? Yes   Up to date of Bone Density/Dexa? Yes Colorectal: up to date  Additional Screenings:  Hepatitis C Screening: declined Flu vaccine due: will receive at [facility] due: ordered    Plan:    I have personally reviewed and addressed the Medicare Annual Wellness questionnaire and have noted the following in the patient's chart:  A. Medical and social history B. Use of alcohol, tobacco or illicit drugs  C. Current medications and supplements D. Functional ability and status E.  Nutritional status F.  Physical activity G. Advance directives H. List of other physicians I.  Hospitalizations, surgeries, and ER visits in previous 12 months J.  [place] to include hearing, vision, cognitive, depression L. Referrals and appointments - none  In addition, I have reviewed and discussed with patient certain preventive protocols, quality metrics, and best practice recommendations. A written personalized care plan for preventive services as well as general preventive health recommendations  were provided to patient.  See attached scanned questionnaire for additional information.   Signed,   Tyson Dense, RN Nurse Health Advisor  Patient Concerns: None I have personally reviewed the health advisor's clinical note, was available for consultation, and agree with the assessment and plan as written. Hendricks Limes M.D., FACP, [facility]

## 2017-11-23 DIAGNOSIS — R262 Difficulty in walking, not elsewhere classified: Secondary | ICD-10-CM | POA: Diagnosis not present

## 2017-11-23 DIAGNOSIS — M86672 Other chronic osteomyelitis, left ankle and foot: Secondary | ICD-10-CM | POA: Diagnosis not present

## 2017-11-23 DIAGNOSIS — R2681 Unsteadiness on feet: Secondary | ICD-10-CM | POA: Diagnosis not present

## 2017-11-23 DIAGNOSIS — M6281 Muscle weakness (generalized): Secondary | ICD-10-CM | POA: Diagnosis not present

## 2017-11-26 DIAGNOSIS — R2681 Unsteadiness on feet: Secondary | ICD-10-CM | POA: Diagnosis not present

## 2017-11-26 DIAGNOSIS — M86672 Other chronic osteomyelitis, left ankle and foot: Secondary | ICD-10-CM | POA: Diagnosis not present

## 2017-11-26 DIAGNOSIS — R262 Difficulty in walking, not elsewhere classified: Secondary | ICD-10-CM | POA: Diagnosis not present

## 2017-11-26 DIAGNOSIS — M6281 Muscle weakness (generalized): Secondary | ICD-10-CM | POA: Diagnosis not present

## 2017-11-27 ENCOUNTER — Encounter: Payer: Self-pay | Admitting: Internal Medicine

## 2017-11-27 ENCOUNTER — Non-Acute Institutional Stay (SKILLED_NURSING_FACILITY): Payer: Medicare Other | Admitting: Internal Medicine

## 2017-11-27 DIAGNOSIS — R262 Difficulty in walking, not elsewhere classified: Secondary | ICD-10-CM | POA: Diagnosis not present

## 2017-11-27 DIAGNOSIS — S82892D Other fracture of left lower leg, subsequent encounter for closed fracture with routine healing: Secondary | ICD-10-CM

## 2017-11-27 DIAGNOSIS — Z794 Long term (current) use of insulin: Secondary | ICD-10-CM | POA: Diagnosis not present

## 2017-11-27 DIAGNOSIS — M6281 Muscle weakness (generalized): Secondary | ICD-10-CM | POA: Diagnosis not present

## 2017-11-27 DIAGNOSIS — I10 Essential (primary) hypertension: Secondary | ICD-10-CM | POA: Diagnosis not present

## 2017-11-27 DIAGNOSIS — E1159 Type 2 diabetes mellitus with other circulatory complications: Secondary | ICD-10-CM

## 2017-11-27 DIAGNOSIS — R2681 Unsteadiness on feet: Secondary | ICD-10-CM | POA: Diagnosis not present

## 2017-11-27 DIAGNOSIS — M86672 Other chronic osteomyelitis, left ankle and foot: Secondary | ICD-10-CM | POA: Diagnosis not present

## 2017-11-27 NOTE — Assessment & Plan Note (Addendum)
PT/OT reports patient is progressing well, goal is assisted living placement 11/27/2017 hydrocodone/ acetaminophen discontinued as patient denies any ankle pain

## 2017-11-27 NOTE — Assessment & Plan Note (Signed)
Glucoses  well-controlled A1c due late November

## 2017-11-27 NOTE — Progress Notes (Signed)
NURSING HOME LOCATION:  [facility] ROOM NUMBER:  [id number]  CODE STATUS:  Full Code  PCP:  Hendricks Limes, MD  [place] [id number]  This is a nursing facility follow up of chronic medical diagnoses.  Interim medical record and care since last [facility] visit was updated with review of diagnostic studies and change in clinical status since last visit were documented.  HPI: The patient has been a resident of the facility since 05/29/2017 after hospitalization for non-ST elevation myocardial infarction.  Medical therapy was recommended for nonobstructive coronary artery disease with lesions varying from 10-25%.  Ejection fraction was normal. Cardiology cleared the patient for left TF-calcaneus fusion which was performed 8/21 by Dr. Sharol Given.  Dr. Megan Salon, ID had confirmed that there was no recurrent osteomyelitis after extended dual therapy with Augmentin and doxycycline. Glucoses at the facility have been well controlled with a low of 113 and a high of 150 on low-dose long-acting insulin, Lantus 5 units at bedtime.  A1c is current and was at goal at 7.1% on 09/21/2017.  Labs performed in August revealed no significant abnormalities.  GFR was slightly reduced at 57 and platelet count was low normal at 153,000. Significant past history includes stroke, ITP, dyslipidemia, hypertension, GERD, emphysema, and basal cell cancer of the breast.  Surgeries include parathyroidectomy and radioactive seed implant following breast lumpectomy. Family history is noncontributory due to age.  Of interest is that her mother also had breast cancer.  Maternal grandfather had heart disease. Patient has never smoked and does not drink.  Review of systems: She is bright, alert, and interactive.  She denies any pain related to the ankle surgery. PT/OT reports she is doing extremely well standing and walking and also helping with transfers.  She does need some help with hygiene  issues.  The goal is assisted living.  She does have some anxiety about what will be the next step.  She denies any diabetic or cardiopulmonary symptoms.  Constitutional: No fever, significant weight change, fatigue  Eyes: No redness, discharge, pain, vision change ENT/mouth: No nasal congestion,  purulent discharge, earache, change in hearing, sore throat  Cardiovascular: No chest pain, palpitations, paroxysmal nocturnal dyspnea, claudication, edema  Respiratory: No cough, sputum production, hemoptysis, DOE, significant snoring, apnea   Gastrointestinal: No heartburn, dysphagia, abdominal pain, nausea /vomiting, rectal bleeding, melena, change in bowels Genitourinary: No dysuria, hematuria, pyuria, incontinence, nocturia Musculoskeletal: No joint stiffness, joint swelling, weakness, pain Dermatologic: No rash, pruritus, change in appearance of skin Neurologic: No dizziness, headache, syncope, seizures, numbness, tingling Psychiatric: No significant  depression, insomnia, anorexia Endocrine: No change in hair/skin/nails, excessive thirst, excessive hunger, excessive urination  Hematologic/lymphatic: No significant bruising, lymphadenopathy, abnormal bleeding Allergy/immunology: No itchy/watery eyes, significant sneezing, urticaria, angioedema  Physical exam:  Pertinent or positive findings: Serial blood pressures were reviewed and blood pressures been well controlled.  Today's blood pressure is an outlier, lower than usual.   There is deficit of the eyebrows laterally.  Lower lids are slightly puffy.  The right lower extremity pedal pulses are strong.  She is wearing a walking boot over the left lower extremity.  General appearance: Adequately nourished; no acute distress, increased work of breathing is present.   Lymphatic: No lymphadenopathy about the head, neck, axilla. Eyes: No conjunctival inflammation or lid edema is present. There is no scleral icterus. Ears:  External ear exam shows  no significant lesions or deformities.   Nose:  External nasal examination shows no  deformity or inflammation. Nasal mucosa are pink and moist without lesions, exudates Oral exam:  Lips and gums are healthy appearing. There is no oropharyngeal erythema or exudate. Neck:  No thyromegaly, masses, tenderness noted.    Heart:  Normal rate and regular rhythm. S1 and S2 normal without gallop, murmur, click, rub .  Lungs: Chest clear to auscultation without wheezes, rhonchi, rales, rubs. Abdomen: Bowel sounds are normal. Abdomen is soft and nontender with no organomegaly, hernias, masses. GU: Deferred  Extremities:  No cyanosis, clubbing, edema  Neurologic exam : Cn 2-7 intact Strength equal  in upper & lower extremities Balance, Rhomberg, finger to nose testing could not be completed due to clinical state Deep tendon reflexes are equal Skin: Warm & dry w/o tenting. No significant lesions or rash.  See summary under each active problem in the Problem List with associated updated therapeutic plan

## 2017-11-27 NOTE — Assessment & Plan Note (Addendum)
BP controlled; no change in antihypertensive medications Today's blood pressure is lower than her average here at the SNF Continue BP monitor

## 2017-11-28 DIAGNOSIS — M6281 Muscle weakness (generalized): Secondary | ICD-10-CM | POA: Diagnosis not present

## 2017-11-28 DIAGNOSIS — R262 Difficulty in walking, not elsewhere classified: Secondary | ICD-10-CM | POA: Diagnosis not present

## 2017-11-28 DIAGNOSIS — M86672 Other chronic osteomyelitis, left ankle and foot: Secondary | ICD-10-CM | POA: Diagnosis not present

## 2017-11-28 DIAGNOSIS — R2681 Unsteadiness on feet: Secondary | ICD-10-CM | POA: Diagnosis not present

## 2017-11-28 NOTE — Patient Instructions (Signed)
See assessment and plan under each diagnosis in the problem list and acutely for this visit 

## 2017-11-29 DIAGNOSIS — R2681 Unsteadiness on feet: Secondary | ICD-10-CM | POA: Diagnosis not present

## 2017-11-29 DIAGNOSIS — M86672 Other chronic osteomyelitis, left ankle and foot: Secondary | ICD-10-CM | POA: Diagnosis not present

## 2017-11-29 DIAGNOSIS — R262 Difficulty in walking, not elsewhere classified: Secondary | ICD-10-CM | POA: Diagnosis not present

## 2017-11-29 DIAGNOSIS — M6281 Muscle weakness (generalized): Secondary | ICD-10-CM | POA: Diagnosis not present

## 2017-11-30 DIAGNOSIS — R262 Difficulty in walking, not elsewhere classified: Secondary | ICD-10-CM | POA: Diagnosis not present

## 2017-11-30 DIAGNOSIS — M86672 Other chronic osteomyelitis, left ankle and foot: Secondary | ICD-10-CM | POA: Diagnosis not present

## 2017-11-30 DIAGNOSIS — M6281 Muscle weakness (generalized): Secondary | ICD-10-CM | POA: Diagnosis not present

## 2017-11-30 DIAGNOSIS — R2681 Unsteadiness on feet: Secondary | ICD-10-CM | POA: Diagnosis not present

## 2017-12-03 DIAGNOSIS — M86672 Other chronic osteomyelitis, left ankle and foot: Secondary | ICD-10-CM | POA: Diagnosis not present

## 2017-12-03 DIAGNOSIS — R262 Difficulty in walking, not elsewhere classified: Secondary | ICD-10-CM | POA: Diagnosis not present

## 2017-12-03 DIAGNOSIS — R2681 Unsteadiness on feet: Secondary | ICD-10-CM | POA: Diagnosis not present

## 2017-12-03 DIAGNOSIS — M6281 Muscle weakness (generalized): Secondary | ICD-10-CM | POA: Diagnosis not present

## 2017-12-04 DIAGNOSIS — M6281 Muscle weakness (generalized): Secondary | ICD-10-CM | POA: Diagnosis not present

## 2017-12-04 DIAGNOSIS — R262 Difficulty in walking, not elsewhere classified: Secondary | ICD-10-CM | POA: Diagnosis not present

## 2017-12-04 DIAGNOSIS — M86672 Other chronic osteomyelitis, left ankle and foot: Secondary | ICD-10-CM | POA: Diagnosis not present

## 2017-12-04 DIAGNOSIS — R2681 Unsteadiness on feet: Secondary | ICD-10-CM | POA: Diagnosis not present

## 2017-12-05 DIAGNOSIS — R2681 Unsteadiness on feet: Secondary | ICD-10-CM | POA: Diagnosis not present

## 2017-12-05 DIAGNOSIS — R262 Difficulty in walking, not elsewhere classified: Secondary | ICD-10-CM | POA: Diagnosis not present

## 2017-12-05 DIAGNOSIS — M86672 Other chronic osteomyelitis, left ankle and foot: Secondary | ICD-10-CM | POA: Diagnosis not present

## 2017-12-05 DIAGNOSIS — M6281 Muscle weakness (generalized): Secondary | ICD-10-CM | POA: Diagnosis not present

## 2017-12-06 DIAGNOSIS — R262 Difficulty in walking, not elsewhere classified: Secondary | ICD-10-CM | POA: Diagnosis not present

## 2017-12-06 DIAGNOSIS — R2681 Unsteadiness on feet: Secondary | ICD-10-CM | POA: Diagnosis not present

## 2017-12-06 DIAGNOSIS — M6281 Muscle weakness (generalized): Secondary | ICD-10-CM | POA: Diagnosis not present

## 2017-12-06 DIAGNOSIS — M86672 Other chronic osteomyelitis, left ankle and foot: Secondary | ICD-10-CM | POA: Diagnosis not present

## 2017-12-07 DIAGNOSIS — M86672 Other chronic osteomyelitis, left ankle and foot: Secondary | ICD-10-CM | POA: Diagnosis not present

## 2017-12-07 DIAGNOSIS — R2681 Unsteadiness on feet: Secondary | ICD-10-CM | POA: Diagnosis not present

## 2017-12-07 DIAGNOSIS — M6281 Muscle weakness (generalized): Secondary | ICD-10-CM | POA: Diagnosis not present

## 2017-12-07 DIAGNOSIS — R262 Difficulty in walking, not elsewhere classified: Secondary | ICD-10-CM | POA: Diagnosis not present

## 2017-12-10 DIAGNOSIS — R2681 Unsteadiness on feet: Secondary | ICD-10-CM | POA: Diagnosis not present

## 2017-12-10 DIAGNOSIS — M6281 Muscle weakness (generalized): Secondary | ICD-10-CM | POA: Diagnosis not present

## 2017-12-10 DIAGNOSIS — M86672 Other chronic osteomyelitis, left ankle and foot: Secondary | ICD-10-CM | POA: Diagnosis not present

## 2017-12-10 DIAGNOSIS — R262 Difficulty in walking, not elsewhere classified: Secondary | ICD-10-CM | POA: Diagnosis not present

## 2017-12-11 DIAGNOSIS — R2681 Unsteadiness on feet: Secondary | ICD-10-CM | POA: Diagnosis not present

## 2017-12-11 DIAGNOSIS — M86672 Other chronic osteomyelitis, left ankle and foot: Secondary | ICD-10-CM | POA: Diagnosis not present

## 2017-12-11 DIAGNOSIS — M6281 Muscle weakness (generalized): Secondary | ICD-10-CM | POA: Diagnosis not present

## 2017-12-11 DIAGNOSIS — R262 Difficulty in walking, not elsewhere classified: Secondary | ICD-10-CM | POA: Diagnosis not present

## 2017-12-12 DIAGNOSIS — R262 Difficulty in walking, not elsewhere classified: Secondary | ICD-10-CM | POA: Diagnosis not present

## 2017-12-12 DIAGNOSIS — M6281 Muscle weakness (generalized): Secondary | ICD-10-CM | POA: Diagnosis not present

## 2017-12-12 DIAGNOSIS — R2681 Unsteadiness on feet: Secondary | ICD-10-CM | POA: Diagnosis not present

## 2017-12-12 DIAGNOSIS — M86672 Other chronic osteomyelitis, left ankle and foot: Secondary | ICD-10-CM | POA: Diagnosis not present

## 2017-12-13 DIAGNOSIS — M86672 Other chronic osteomyelitis, left ankle and foot: Secondary | ICD-10-CM | POA: Diagnosis not present

## 2017-12-13 DIAGNOSIS — R2681 Unsteadiness on feet: Secondary | ICD-10-CM | POA: Diagnosis not present

## 2017-12-13 DIAGNOSIS — M6281 Muscle weakness (generalized): Secondary | ICD-10-CM | POA: Diagnosis not present

## 2017-12-13 DIAGNOSIS — R262 Difficulty in walking, not elsewhere classified: Secondary | ICD-10-CM | POA: Diagnosis not present

## 2017-12-14 DIAGNOSIS — R2681 Unsteadiness on feet: Secondary | ICD-10-CM | POA: Diagnosis not present

## 2017-12-14 DIAGNOSIS — M6281 Muscle weakness (generalized): Secondary | ICD-10-CM | POA: Diagnosis not present

## 2017-12-14 DIAGNOSIS — R262 Difficulty in walking, not elsewhere classified: Secondary | ICD-10-CM | POA: Diagnosis not present

## 2017-12-14 DIAGNOSIS — M86672 Other chronic osteomyelitis, left ankle and foot: Secondary | ICD-10-CM | POA: Diagnosis not present

## 2017-12-17 ENCOUNTER — Encounter (INDEPENDENT_AMBULATORY_CARE_PROVIDER_SITE_OTHER): Payer: Self-pay | Admitting: Orthopedic Surgery

## 2017-12-17 ENCOUNTER — Ambulatory Visit (INDEPENDENT_AMBULATORY_CARE_PROVIDER_SITE_OTHER): Payer: Medicare Other | Admitting: Physician Assistant

## 2017-12-17 VITALS — Ht 62.0 in | Wt 159.4 lb

## 2017-12-17 DIAGNOSIS — E43 Unspecified severe protein-calorie malnutrition: Secondary | ICD-10-CM

## 2017-12-17 DIAGNOSIS — M6281 Muscle weakness (generalized): Secondary | ICD-10-CM | POA: Diagnosis not present

## 2017-12-17 DIAGNOSIS — E1142 Type 2 diabetes mellitus with diabetic polyneuropathy: Secondary | ICD-10-CM

## 2017-12-17 DIAGNOSIS — R262 Difficulty in walking, not elsewhere classified: Secondary | ICD-10-CM | POA: Diagnosis not present

## 2017-12-17 DIAGNOSIS — Z981 Arthrodesis status: Secondary | ICD-10-CM

## 2017-12-17 DIAGNOSIS — M86672 Other chronic osteomyelitis, left ankle and foot: Secondary | ICD-10-CM | POA: Diagnosis not present

## 2017-12-17 DIAGNOSIS — R2681 Unsteadiness on feet: Secondary | ICD-10-CM | POA: Diagnosis not present

## 2017-12-17 NOTE — Progress Notes (Signed)
Office Visit Note   Patient: Chelsea [name]           Date of Birth: 1928-06-30           MRN: [id number] Visit Date: 12/17/2017              Requested by: Jani Gravel, MD [address] [place], [place] [address] PCP: Hendricks Limes, MD  Chief Complaint  Patient presents with  . Left Leg - Follow-up      HPI: The patient is a 84 year old female who is seen for postoperative follow-up following a left ankle fusion on 09/26/2017.  She had a chronic left ankle fracture/dislocation.  She continues to reside at [facility] facility.  She is working with physical therapy weightbearing as tolerated in a fracture boot.  She reports that the nursing facility staff has not been washing her left leg as they did not want to take it out of the Ace wrap and we discussed that it is fine for them to wash the left leg daily and applying lotion and then reapply her Ace wrap for edema control as she is been unable to tolerate compression stockings.  She reports she has been walking up to 100 feet with physical therapy with a walker and fracture boot.  She reports some minimal discomfort but is pleased with her progress.  Assessment & Plan: Visit Diagnoses:  1. S/P ankle fusion   2. Diabetic polyneuropathy associated with type 2 diabetes mellitus ([place])   3. Severe protein-calorie malnutrition ([place])     Plan: Recommend continued physical therapy weightbearing as tolerated in her fracture boot and utilizing a walker.  She may have the boot off when she is in bed.  She can wash the left leg daily and apply lotion and reapply an Ace wrap for edema control.  She does request to take her Protonix before breakfast and this order was also written.  She will follow-up here in 4 weeks with radiographs at this time or sooner should she have difficulty in the interim.  Orders were sent with the patient back to [facility] skilled nursing facility for all the above.  Follow-Up Instructions:  Return in about 4 weeks (around 01/14/2018).   Ortho Exam  Patient is alert, oriented, no adenopathy, well-dressed, normal affect, normal respiratory effort. Patient is an elderly female who presents in a wheelchair.  She is wearing her fracture boot.  Her left ankle and foot erythema are markedly improved but her skin is very dry and flaking.  She has good dorsalis pedis pulse.  There are no signs of cellulitis or infection.  Her incisions are healing well.  Imaging: No results found. No images are attached to the encounter.  Labs: Lab Results  Component Value Date   HGBA1C 7.1 (A) 09/21/2017   HGBA1C 6.8 (H) 04/17/2017   HGBA1C 7.2 (H) 10/20/2014   ESRSEDRATE 9 04/17/2017   ESRSEDRATE 11 02/04/2009   CRP 3.4 04/17/2017   CRP 0.8 (H) 02/04/2009   REPTSTATUS 05/18/2017 FINAL 05/12/2017   GRAMSTAIN  03/08/2017    NO WBC SEEN FEW GRAM NEGATIVE RODS FEW GRAM POSITIVE COCCI IN PAIRS RARE GRAM POSITIVE RODS Performed at [facility], [place] [address], [place], [place] [address]    CULT  05/12/2017    NO GROWTH 5 DAYS Performed at [facility] [address] [place], [place] [address]    [facility] 03/08/2017   LABORGA ENTEROCOCCUS FAECALIS 03/08/2017  Lab Results  Component Value Date   ALBUMIN 2.2 (L) 05/29/2017   ALBUMIN 2.2 (L) 05/28/2017   ALBUMIN 2.2 (L) 05/27/2017    Body mass index is 29.15 kg/m.  Orders:  No orders of the defined types were placed in this encounter.  No orders of the defined types were placed in this encounter.    Procedures: No procedures performed  Clinical Data: No additional findings.  ROS:  All other systems negative, except as noted in the HPI. Review of Systems  Objective: Vital Signs: Ht 5\' 2"  (1.575 m)   Wt 159 lb 6.4 oz (72.3 kg)   BMI 29.15 kg/m   Specialty Comments:  No specialty comments available.  PMFS History: Patient Active Problem List   Diagnosis Date Noted  . Closed  fracture dislocation of left ankle joint   . Pressure injury of skin 05/16/2017  . Acute kidney failure ([place])   . Acute on chronic combined systolic and diastolic CHF (congestive heart failure) ([place])   . NSTEMI (non-ST elevated myocardial infarction) ([place]) 05/13/2017  . HTN (hypertension) 05/13/2017  . Diabetes mellitus ([place]) 04/10/2017  . Venous stasis dermatitis of both lower extremities 04/10/2017  . Neuropathy 04/10/2017  . Chronic osteomyelitis of left foot ([place]) 04/10/2017  . Breast cancer of upper-inner quadrant of right female breast ([place]) 10/05/2014  . Occult blood positive stool 03/24/2014  . History of ITP 04/14/2011   Past Medical History:  Diagnosis Date  . Anemia    takes Ferrous Sulfate daily  . Arthritis   . Basal cell carcinoma of breast 1998  . Bruises easily   . Dizziness   . DM (diabetes mellitus) ([place])    takes Metformin daily  . Eczema   . Emphysema lung ([place])   . GERD (gastroesophageal reflux disease)    takes Protonix daily  . Glaucoma    Bil  . History of hiatal hernia   . HTN (hypertension)    takes Diovan daily  . Hyperlipidemia   . ITP (idiopathic thrombocytopenic purpura) 2/09  . Myocardial infarction ([place])    N-stemi 2019  . Nocturia   . Osteoporosis   . Peripheral edema   . Stroke ([place]) 02/03/09   mid brain, diplopia    Family History  Problem Relation Age of Onset  . Breast cancer Mother 74  . Colon cancer Father   . Diabetes Father        questionable  . Multiple sclerosis Daughter   . Heart disease Maternal Grandfather     Past Surgical History:  Procedure Laterality Date  . ANKLE FUSION Left 09/26/2017   Procedure: LEFT TIB-CALC FUSION;  Surgeon: Newt Minion, MD;  Location: [facility];  Service: Orthopedics;  Laterality: Left;  . BREAST LUMPECTOMY WITH RADIOACTIVE SEED LOCALIZATION Right 10/23/2014   Procedure: RIGHT BREAST LUMPECTOMY WITH RADIOACTIVE SEED LOCALIZATION;  Surgeon: Fanny Skates, MD;  Location: [facility];  Service:  General;  Laterality: Right;  . CATARACT EXTRACTION Bilateral   . COLONOSCOPY    . ESOPHAGOGASTRODUODENOSCOPY    . LEFT HEART CATH AND CORONARY ANGIOGRAPHY N/A 07/09/2017   Procedure: LEFT HEART CATH AND CORONARY ANGIOGRAPHY;  Surgeon: Jettie Booze, MD;  Location: [facility];  Service: Cardiovascular;  Laterality: N/A;  . MYOMECTOMY N/A 03/14/1995   late 90's per pt  . PARATHYROIDECTOMY  2008  . REFRACTIVE SURGERY Bilateral   . TONSILLECTOMY  as a child   Social History   Occupational History  . Not on file  Tobacco Use  . Smoking status: Never Smoker  . Smokeless tobacco: Never Used  Substance and Sexual Activity  . Alcohol use: No    Alcohol/week: 0.0 standard drinks  . Drug use: No  . Sexual activity: Not on file

## 2017-12-18 DIAGNOSIS — R262 Difficulty in walking, not elsewhere classified: Secondary | ICD-10-CM | POA: Diagnosis not present

## 2017-12-18 DIAGNOSIS — M86672 Other chronic osteomyelitis, left ankle and foot: Secondary | ICD-10-CM | POA: Diagnosis not present

## 2017-12-18 DIAGNOSIS — R2681 Unsteadiness on feet: Secondary | ICD-10-CM | POA: Diagnosis not present

## 2017-12-18 DIAGNOSIS — M6281 Muscle weakness (generalized): Secondary | ICD-10-CM | POA: Diagnosis not present

## 2017-12-19 ENCOUNTER — Non-Acute Institutional Stay (SKILLED_NURSING_FACILITY): Payer: Medicare Other | Admitting: Adult Health

## 2017-12-19 ENCOUNTER — Encounter: Payer: Self-pay | Admitting: Adult Health

## 2017-12-19 DIAGNOSIS — E1159 Type 2 diabetes mellitus with other circulatory complications: Secondary | ICD-10-CM | POA: Diagnosis not present

## 2017-12-19 DIAGNOSIS — I252 Old myocardial infarction: Secondary | ICD-10-CM | POA: Diagnosis not present

## 2017-12-19 DIAGNOSIS — R262 Difficulty in walking, not elsewhere classified: Secondary | ICD-10-CM | POA: Diagnosis not present

## 2017-12-19 DIAGNOSIS — S82892D Other fracture of left lower leg, subsequent encounter for closed fracture with routine healing: Secondary | ICD-10-CM | POA: Diagnosis not present

## 2017-12-19 DIAGNOSIS — L853 Xerosis cutis: Secondary | ICD-10-CM

## 2017-12-19 DIAGNOSIS — R2681 Unsteadiness on feet: Secondary | ICD-10-CM | POA: Diagnosis not present

## 2017-12-19 DIAGNOSIS — Z794 Long term (current) use of insulin: Secondary | ICD-10-CM

## 2017-12-19 DIAGNOSIS — M6281 Muscle weakness (generalized): Secondary | ICD-10-CM | POA: Diagnosis not present

## 2017-12-19 DIAGNOSIS — I10 Essential (primary) hypertension: Secondary | ICD-10-CM

## 2017-12-19 DIAGNOSIS — M86672 Other chronic osteomyelitis, left ankle and foot: Secondary | ICD-10-CM | POA: Diagnosis not present

## 2017-12-19 NOTE — Progress Notes (Signed)
Location:  [place] Room Number: 217-A Place of Service:  SNF (31) Provider:  Durenda Age, NP  Patient Care Team: Hendricks Limes, MD as PCP - General (Internal Medicine) Nahser, Wonda Cheng, MD as PCP - Cardiology (Cardiology) Thressa Sheller, MD as Consulting Physician (Internal Medicine) Fanny Skates, MD as Consulting Physician (General Surgery) Truitt Merle, MD as Consulting Physician (Hematology) Thea Silversmith, MD as Consulting Physician (Radiation Oncology) Mauro Kaufmann, RN as Registered Nurse Rockwell Germany, RN as Registered Nurse Jake Shark, Johny Blamer, NP as Nurse Practitioner (Nurse Practitioner) Nickola Major, NP as Nurse Practitioner (Internal Medicine)  Extended Emergency Contact Information Primary Emergency Contact: [employer] Address: [address] [place]          [place], [place] [address] Johnnette Litter of [place] Phone: [phone] Work Phone: [phone] Relation: Daughter Secondary Emergency Contact: Bartholome Bill Address: [address]          [place], [place] [address] Johnnette Litter of [place] Phone: [phone] Mobile Phone: [phone] Relation: Grandson  Code Status:  Full Code   Goals of care: Advanced Directive information Advanced Directives 11/22/2017  Does Patient Have a Medical Advance Directive? No  Type of Advance Directive -  Does patient want to make changes to medical advance directive? -  Copy of [place] in Chart? -  Would patient like information on creating a medical advance directive? No - Patient declined     Chief Complaint  Patient presents with  . Medical Management of Chronic Issues    Routine [facility] visit    HPI:  Pt is a 84 y.o. female seen today for medical management of chronic diseases.  She is a long-term care resident of [facility].  She has a PMH of chronic osteomyelitis of her left foot,  hypertension, acute kidney failure, diabetes, and acute on chronic combined diastolic/systolic heart failure. She was seen in her room today. She complained of itching on her right lower leg. No erythema has been noted.   Past Medical History:  Diagnosis Date  . Anemia    takes Ferrous Sulfate daily  . Arthritis   . Basal cell carcinoma of breast 1998  . Bruises easily   . Dizziness   . DM (diabetes mellitus) ([place])    takes Metformin daily  . Eczema   . Emphysema lung ([place])   . GERD (gastroesophageal reflux disease)    takes Protonix daily  . Glaucoma    Bil  . History of hiatal hernia   . HTN (hypertension)    takes Diovan daily  . Hyperlipidemia   . ITP (idiopathic thrombocytopenic purpura) 2/09  . Myocardial infarction ([place])    N-stemi 2019  . Nocturia   . Osteoporosis   . Peripheral edema   . Stroke ([place]) 02/03/09   mid brain, diplopia   Past Surgical History:  Procedure Laterality Date  . ANKLE FUSION Left 09/26/2017   Procedure: LEFT TIB-CALC FUSION;  Surgeon: Newt Minion, MD;  Location: [place];  Service: Orthopedics;  Laterality: Left;  . BREAST LUMPECTOMY WITH RADIOACTIVE SEED LOCALIZATION Right 10/23/2014   Procedure: RIGHT BREAST LUMPECTOMY WITH RADIOACTIVE SEED LOCALIZATION;  Surgeon: Fanny Skates, MD;  Location: [place];  Service: General;  Laterality: Right;  . CATARACT EXTRACTION Bilateral   . COLONOSCOPY    . ESOPHAGOGASTRODUODENOSCOPY    . LEFT HEART CATH AND CORONARY ANGIOGRAPHY N/A 07/09/2017   Procedure: LEFT HEART CATH AND CORONARY ANGIOGRAPHY;  Surgeon:  Jettie Booze, MD;  Location: [place] CV LAB;  Service: Cardiovascular;  Laterality: N/A;  . MYOMECTOMY N/A 03/14/1995   late 90's per pt  . PARATHYROIDECTOMY  2008  . REFRACTIVE SURGERY Bilateral   . TONSILLECTOMY  as a child    Allergies  Allergen Reactions  . Travatan Z [Travoprost (Bak Free)] Other (See Comments)    dizziness    Outpatient Encounter Medications as of 12/19/2017    Medication Sig  . acetaminophen (TYLENOL) 325 MG tablet Take 650 mg by mouth every 4 (four) hours as needed for mild pain.  . Amino Acids-Protein Hydrolys (FEEDING SUPPLEMENT, PRO-STAT SUGAR FREE 64,) LIQD Take 30 mLs by mouth daily.  Marland Kitchen aspirin 81 MG chewable tablet Chew 81 mg by mouth daily after breakfast.   . bisacodyl (DULCOLAX) 10 MG suppository Place 10 mg rectally daily as needed for moderate constipation (If not relieved by MOM).   . carvedilol (COREG) 3.125 MG tablet Take 1 tablet (3.125 mg total) by mouth 2 (two) times daily with a meal.  . Cholecalciferol 1000 units tablet Take 2,000 Units by mouth daily. Take 2 tablets to = 2000U  . insulin glargine (LANTUS) 100 UNIT/ML injection Inject 5 Units into the skin at bedtime.   . isosorbide mononitrate (IMDUR) 30 MG 24 hr tablet Take 0.5 tablets (15 mg total) by mouth daily.  Marland Kitchen lactulose (CHRONULAC) 10 GM/15ML solution Take 45 g by mouth at bedtime as needed.   Marland Kitchen losartan (COZAAR) 25 MG tablet Take 25 mg by mouth daily.  . magnesium hydroxide (MILK OF MAGNESIA) 400 MG/5ML suspension Take 30 mLs by mouth daily as needed for mild constipation (If no BM in 3 days).  . magnesium oxide (MAG-OX) 400 (241.3 Mg) MG tablet Take 1 tablet (400 mg total) by mouth 2 (two) times daily.  . Multiple Vitamins-Minerals (MULTIVITAMIN PO) Take 1 tablet by mouth daily.  . nitroGLYCERIN (NITROSTAT) 0.4 MG SL tablet Place 1 tablet (0.4 mg total) under the tongue every 5 (five) minutes x 3 doses as needed for chest pain.  . Omega-3 Fatty Acids (FISH OIL) 1000 MG CAPS Take 1,000 mg by mouth 2 (two) times daily.   . pantoprazole (PROTONIX) 40 MG tablet Take 40 mg by mouth daily after breakfast.   . polyethylene glycol (MIRALAX / GLYCOLAX) packet Take 17 g by mouth daily.  . polyvinyl alcohol (LIQUIFILM TEARS) 1.4 % ophthalmic solution Place 1 drop into both eyes 2 (two) times daily.  . sennosides-docusate sodium (SENOKOT-S) 8.6-50 MG tablet Take 2 tablets by mouth  2 (two) times daily.  . Sodium Phosphates (RA SALINE ENEMA) 19-7 GM/118ML ENEM Place 1 each rectally daily as needed (for constipation. If not relieved by Bisacodyl Suppository).   . [DISCONTINUED] HYDROcodone-acetaminophen (NORCO/VICODIN) 5-325 MG tablet Take 1 tablet by mouth every 6 (six) hours as needed for moderate pain.  . [DISCONTINUED] hydrOXYzine (ATARAX/VISTARIL) 10 MG tablet Take 10 mg by mouth every 6 (six) hours as needed for itching.   No facility-administered encounter medications on file as of 12/19/2017.     Review of Systems  GENERAL: No change in appetite, no fatigue, no weight changes, no fever, chills or weakness MOUTH and THROAT: Denies oral discomfort, gingival pain or bleeding RESPIRATORY: no cough, SOB, DOE, wheezing, hemoptysis CARDIAC: No chest pain, edema or palpitations GI: No abdominal pain, diarrhea, constipation, heart burn, nausea or vomiting GU: Denies dysuria, frequency, hematuria, incontinence, or discharge PSYCHIATRIC: Denies feelings of depression or anxiety. No report of hallucinations, insomnia,  paranoia, or agitation   Immunization History  Administered Date(s) Administered  . Influenza Split 01/14/2011  . Influenza Whole 01/27/2010  . Influenza-Unspecified 01/09/2011, 11/06/2016  . Pneumococcal Conjugate-13 06/26/2017  . Pneumococcal Polysaccharide-23 07/04/2007  . Tdap 11/23/2017   Pertinent  Health Maintenance Due  Topic Date Due  . FOOT EXAM  09/02/1938  . INFLUENZA VACCINE  09/06/2017  . HEMOGLOBIN A1C  03/24/2018  . OPHTHALMOLOGY EXAM  11/13/2018  . DEXA SCAN  Completed  . PNA vac Low Risk Adult  Completed   Fall Risk  11/22/2017 09/10/2017 07/17/2017 06/14/2017 05/10/2017  Falls in the past year? No Yes Yes Yes Yes  Number falls in past yr: - 1 1 - 1  Injury with Fall? - Yes Yes Yes No  Comment - - - - "fall in hallway at home"  Risk for fall due to : - History of fall(s) - - Impaired balance/gait      Vitals:   12/19/17 1010   BP: 111/68  Pulse: 60  Resp: 16  Temp: 98.6 F (37 C)  TempSrc: Oral  SpO2: 97%  Weight: 163 lb 12.8 oz (74.3 kg)  Height: 5\' 2"  (1.575 m)   Body mass index is 29.96 kg/m.  Physical Exam  GENERAL APPEARANCE: Well nourished. In no acute distress. Normal body habitus SKIN:  Dry skin on bilateral lower legs  MOUTH and THROAT: Lips are without lesions. Oral mucosa is moist and without lesions. Tongue is normal in shape, size, and color and without lesions RESPIRATORY: Breathing is even & unlabored, BS CTAB CARDIAC: RRR, no murmur,no extra heart sounds, no edema GI: Abdomen soft, normal BS, no masses, no tenderness EXTREMITIES:  Able to move X 4 extremities NEUROLOGICAL: There is no tremor. Speech is clear. Alert and oriented X 3. PSYCHIATRIC:  Affect and behavior are appropriate  Labs reviewed: Recent Labs    05/17/17 0313 05/18/17 0419  05/27/17 0247 05/28/17 0342 05/29/17 0349  07/09/17 1000 08/13/17 09/26/17 0847  NA 133* 132*   < > 136 132* 135   < > 139 141 142  K 5.2* 5.6*   < > 4.5 3.9 4.0   < > 4.0 4.1 4.0  CL 104 103   < > 103 100* 103  --  106  --  107  CO2 18* 18*   < > 19* 19* 20*  --  26  --  27  GLUCOSE 148* 136*   < > 118* 108* 119*  --  125*  --  113*  BUN 57* 74*   < > 117* 102* 105*   < > 13 22* 23  CREATININE 5.78* 6.29*   < > 4.86* 4.17* 3.76*   < > 1.00 0.9 0.88  CALCIUM 7.8* 8.3*   < > 8.6* 8.5* 8.6*  --  9.2  --  9.4  MG 1.8 1.7  --   --   --  1.2*  --   --   --   --   PHOS  --  6.8*   < > 6.8* 5.5* 5.5*  --   --   --   --    < > = values in this interval not displayed.   Recent Labs    04/17/17 1146 05/17/17 0313  05/21/17 0317  05/27/17 0247 05/28/17 0342 05/29/17 0349  AST 21 144*  --  25  --   --   --   --   ALT 14 127*  --  60*  --   --   --   --  ALKPHOS  --  73  --  56  --   --   --   --   BILITOT 0.5 0.6  --  0.6  --   --   --   --   PROT 6.5 5.0*  --  5.1*  --   --   --   --   ALBUMIN  --  1.9*   < > 1.8*   < > 2.2* 2.2* 2.2*    < > = values in this interval not displayed.   Recent Labs    05/12/17 2326  05/13/17 0944  05/26/17 0312 05/28/17 0342 06/26/17 09/26/17 0847  WBC 9.6  --  11.9*   < > 8.0 7.7 5.0 5.4  NEUTROABS 8.1*  --  9.2*  --   --   --  3  --   HGB 16.3*   < > 12.1   < > 9.9* 9.7* 11.7* 14.3  HCT 49.5*   < > 37.5   < > 28.8* 29.9* 35* 44.4  MCV 85.3  --  84.7   < > 83.5 84.7  --  88.3  PLT 214  --  234   < > 301 288 195 153   < > = values in this interval not displayed.   Lab Results  Component Value Date   TSH 2.057 Test methodology is 3rd generation TSH 02/04/2009   Lab Results  Component Value Date   HGBA1C 7.1 (A) 09/21/2017   Lab Results  Component Value Date   CHOL 113 05/13/2017   HDL 52 05/13/2017   LDLCALC 55 05/13/2017   TRIG 28 05/13/2017   CHOLHDL 2.2 05/13/2017    Assessment/Plan  1. Dry skin - will apply Cetaphil cream to bilateral lower legs daily   2. History of non-ST elevation myocardial infarction (NSTEMI) -no complaints of chest pains, continue NTG as needed, aspirin 81 mg chewable 1 tab daily and isosorbide mononitrate ER 30 mg 1/2 tab = 15 mg daily   3. Type 2 diabetes mellitus with other circulatory complication, with long-term current use of insulin (HCC) -well-controlled, continue Lantus 100 units/mL inject 5 units subcutaneously nightly Lab Results  Component Value Date   HGBA1C 7.1 (A) 09/21/2017     4. Closed fracture dislocation of left ankle with routine healing, subsequent encounter  - recently followed up with orthopedics, has orders for Ace wrap daily and may have boot off when in bed, WBAT    5. Essential hypertension  -well-controlled, continue losartan potassium 25 mg 1 tab daily and carvedilol 3.125 mg 1 tab twice daily   6. Hypomagnesemia  -continue magnesium oxide 400 mg 1 tab twice a day    Family/ staff Communication: Discussed plan of care with resident.  Labs/tests ordered:  None  Goals of care:   Long-term  care.   Durenda Age, NP [facility] [phone] (Monday-Friday 8:00 a.m. - 5:00 p.m.) [phone] (after hours)

## 2017-12-20 DIAGNOSIS — R262 Difficulty in walking, not elsewhere classified: Secondary | ICD-10-CM | POA: Diagnosis not present

## 2017-12-20 DIAGNOSIS — M6281 Muscle weakness (generalized): Secondary | ICD-10-CM | POA: Diagnosis not present

## 2017-12-20 DIAGNOSIS — M86672 Other chronic osteomyelitis, left ankle and foot: Secondary | ICD-10-CM | POA: Diagnosis not present

## 2017-12-20 DIAGNOSIS — R2681 Unsteadiness on feet: Secondary | ICD-10-CM | POA: Diagnosis not present

## 2017-12-21 DIAGNOSIS — M6281 Muscle weakness (generalized): Secondary | ICD-10-CM | POA: Diagnosis not present

## 2017-12-21 DIAGNOSIS — R262 Difficulty in walking, not elsewhere classified: Secondary | ICD-10-CM | POA: Diagnosis not present

## 2017-12-21 DIAGNOSIS — R2681 Unsteadiness on feet: Secondary | ICD-10-CM | POA: Diagnosis not present

## 2017-12-21 DIAGNOSIS — M86672 Other chronic osteomyelitis, left ankle and foot: Secondary | ICD-10-CM | POA: Diagnosis not present

## 2017-12-22 DIAGNOSIS — R262 Difficulty in walking, not elsewhere classified: Secondary | ICD-10-CM | POA: Diagnosis not present

## 2017-12-22 DIAGNOSIS — M6281 Muscle weakness (generalized): Secondary | ICD-10-CM | POA: Diagnosis not present

## 2017-12-22 DIAGNOSIS — M86672 Other chronic osteomyelitis, left ankle and foot: Secondary | ICD-10-CM | POA: Diagnosis not present

## 2017-12-22 DIAGNOSIS — R2681 Unsteadiness on feet: Secondary | ICD-10-CM | POA: Diagnosis not present

## 2017-12-24 DIAGNOSIS — R262 Difficulty in walking, not elsewhere classified: Secondary | ICD-10-CM | POA: Diagnosis not present

## 2017-12-24 DIAGNOSIS — R2681 Unsteadiness on feet: Secondary | ICD-10-CM | POA: Diagnosis not present

## 2017-12-24 DIAGNOSIS — M6281 Muscle weakness (generalized): Secondary | ICD-10-CM | POA: Diagnosis not present

## 2017-12-24 DIAGNOSIS — M86672 Other chronic osteomyelitis, left ankle and foot: Secondary | ICD-10-CM | POA: Diagnosis not present

## 2017-12-25 DIAGNOSIS — M86672 Other chronic osteomyelitis, left ankle and foot: Secondary | ICD-10-CM | POA: Diagnosis not present

## 2017-12-25 DIAGNOSIS — M6281 Muscle weakness (generalized): Secondary | ICD-10-CM | POA: Diagnosis not present

## 2017-12-25 DIAGNOSIS — R262 Difficulty in walking, not elsewhere classified: Secondary | ICD-10-CM | POA: Diagnosis not present

## 2017-12-25 DIAGNOSIS — R2681 Unsteadiness on feet: Secondary | ICD-10-CM | POA: Diagnosis not present

## 2017-12-26 DIAGNOSIS — R262 Difficulty in walking, not elsewhere classified: Secondary | ICD-10-CM | POA: Diagnosis not present

## 2017-12-26 DIAGNOSIS — M6281 Muscle weakness (generalized): Secondary | ICD-10-CM | POA: Diagnosis not present

## 2017-12-26 DIAGNOSIS — R2681 Unsteadiness on feet: Secondary | ICD-10-CM | POA: Diagnosis not present

## 2017-12-26 DIAGNOSIS — M86672 Other chronic osteomyelitis, left ankle and foot: Secondary | ICD-10-CM | POA: Diagnosis not present

## 2017-12-27 DIAGNOSIS — R262 Difficulty in walking, not elsewhere classified: Secondary | ICD-10-CM | POA: Diagnosis not present

## 2017-12-27 DIAGNOSIS — R2681 Unsteadiness on feet: Secondary | ICD-10-CM | POA: Diagnosis not present

## 2017-12-27 DIAGNOSIS — M86672 Other chronic osteomyelitis, left ankle and foot: Secondary | ICD-10-CM | POA: Diagnosis not present

## 2017-12-27 DIAGNOSIS — M6281 Muscle weakness (generalized): Secondary | ICD-10-CM | POA: Diagnosis not present

## 2017-12-28 DIAGNOSIS — M6281 Muscle weakness (generalized): Secondary | ICD-10-CM | POA: Diagnosis not present

## 2017-12-28 DIAGNOSIS — M86672 Other chronic osteomyelitis, left ankle and foot: Secondary | ICD-10-CM | POA: Diagnosis not present

## 2017-12-28 DIAGNOSIS — R2681 Unsteadiness on feet: Secondary | ICD-10-CM | POA: Diagnosis not present

## 2017-12-28 DIAGNOSIS — R262 Difficulty in walking, not elsewhere classified: Secondary | ICD-10-CM | POA: Diagnosis not present

## 2017-12-31 DIAGNOSIS — R2681 Unsteadiness on feet: Secondary | ICD-10-CM | POA: Diagnosis not present

## 2017-12-31 DIAGNOSIS — R262 Difficulty in walking, not elsewhere classified: Secondary | ICD-10-CM | POA: Diagnosis not present

## 2017-12-31 DIAGNOSIS — M6281 Muscle weakness (generalized): Secondary | ICD-10-CM | POA: Diagnosis not present

## 2017-12-31 DIAGNOSIS — M86672 Other chronic osteomyelitis, left ankle and foot: Secondary | ICD-10-CM | POA: Diagnosis not present

## 2018-01-01 DIAGNOSIS — M6281 Muscle weakness (generalized): Secondary | ICD-10-CM | POA: Diagnosis not present

## 2018-01-01 DIAGNOSIS — M86672 Other chronic osteomyelitis, left ankle and foot: Secondary | ICD-10-CM | POA: Diagnosis not present

## 2018-01-01 DIAGNOSIS — R262 Difficulty in walking, not elsewhere classified: Secondary | ICD-10-CM | POA: Diagnosis not present

## 2018-01-01 DIAGNOSIS — R2681 Unsteadiness on feet: Secondary | ICD-10-CM | POA: Diagnosis not present

## 2018-01-02 DIAGNOSIS — M6281 Muscle weakness (generalized): Secondary | ICD-10-CM | POA: Diagnosis not present

## 2018-01-02 DIAGNOSIS — R2681 Unsteadiness on feet: Secondary | ICD-10-CM | POA: Diagnosis not present

## 2018-01-02 DIAGNOSIS — M86672 Other chronic osteomyelitis, left ankle and foot: Secondary | ICD-10-CM | POA: Diagnosis not present

## 2018-01-02 DIAGNOSIS — R262 Difficulty in walking, not elsewhere classified: Secondary | ICD-10-CM | POA: Diagnosis not present

## 2018-01-03 DIAGNOSIS — M6281 Muscle weakness (generalized): Secondary | ICD-10-CM | POA: Diagnosis not present

## 2018-01-03 DIAGNOSIS — R2681 Unsteadiness on feet: Secondary | ICD-10-CM | POA: Diagnosis not present

## 2018-01-03 DIAGNOSIS — R262 Difficulty in walking, not elsewhere classified: Secondary | ICD-10-CM | POA: Diagnosis not present

## 2018-01-03 DIAGNOSIS — M86672 Other chronic osteomyelitis, left ankle and foot: Secondary | ICD-10-CM | POA: Diagnosis not present

## 2018-01-04 DIAGNOSIS — M86672 Other chronic osteomyelitis, left ankle and foot: Secondary | ICD-10-CM | POA: Diagnosis not present

## 2018-01-04 DIAGNOSIS — R2681 Unsteadiness on feet: Secondary | ICD-10-CM | POA: Diagnosis not present

## 2018-01-04 DIAGNOSIS — R262 Difficulty in walking, not elsewhere classified: Secondary | ICD-10-CM | POA: Diagnosis not present

## 2018-01-04 DIAGNOSIS — M6281 Muscle weakness (generalized): Secondary | ICD-10-CM | POA: Diagnosis not present

## 2018-01-07 DIAGNOSIS — M86672 Other chronic osteomyelitis, left ankle and foot: Secondary | ICD-10-CM | POA: Diagnosis not present

## 2018-01-07 DIAGNOSIS — R2681 Unsteadiness on feet: Secondary | ICD-10-CM | POA: Diagnosis not present

## 2018-01-07 DIAGNOSIS — R262 Difficulty in walking, not elsewhere classified: Secondary | ICD-10-CM | POA: Diagnosis not present

## 2018-01-08 DIAGNOSIS — R262 Difficulty in walking, not elsewhere classified: Secondary | ICD-10-CM | POA: Diagnosis not present

## 2018-01-08 DIAGNOSIS — M86672 Other chronic osteomyelitis, left ankle and foot: Secondary | ICD-10-CM | POA: Diagnosis not present

## 2018-01-08 DIAGNOSIS — R2681 Unsteadiness on feet: Secondary | ICD-10-CM | POA: Diagnosis not present

## 2018-01-09 DIAGNOSIS — M86672 Other chronic osteomyelitis, left ankle and foot: Secondary | ICD-10-CM | POA: Diagnosis not present

## 2018-01-09 DIAGNOSIS — R262 Difficulty in walking, not elsewhere classified: Secondary | ICD-10-CM | POA: Diagnosis not present

## 2018-01-09 DIAGNOSIS — R2681 Unsteadiness on feet: Secondary | ICD-10-CM | POA: Diagnosis not present

## 2018-01-10 DIAGNOSIS — M86672 Other chronic osteomyelitis, left ankle and foot: Secondary | ICD-10-CM | POA: Diagnosis not present

## 2018-01-10 DIAGNOSIS — R262 Difficulty in walking, not elsewhere classified: Secondary | ICD-10-CM | POA: Diagnosis not present

## 2018-01-10 DIAGNOSIS — R2681 Unsteadiness on feet: Secondary | ICD-10-CM | POA: Diagnosis not present

## 2018-01-11 ENCOUNTER — Non-Acute Institutional Stay (SKILLED_NURSING_FACILITY): Payer: Medicare Other | Admitting: Adult Health

## 2018-01-11 ENCOUNTER — Encounter: Payer: Self-pay | Admitting: Adult Health

## 2018-01-11 DIAGNOSIS — R262 Difficulty in walking, not elsewhere classified: Secondary | ICD-10-CM | POA: Diagnosis not present

## 2018-01-11 DIAGNOSIS — I9589 Other hypotension: Secondary | ICD-10-CM

## 2018-01-11 DIAGNOSIS — K219 Gastro-esophageal reflux disease without esophagitis: Secondary | ICD-10-CM

## 2018-01-11 DIAGNOSIS — M86672 Other chronic osteomyelitis, left ankle and foot: Secondary | ICD-10-CM | POA: Diagnosis not present

## 2018-01-11 DIAGNOSIS — Z794 Long term (current) use of insulin: Secondary | ICD-10-CM

## 2018-01-11 DIAGNOSIS — R2681 Unsteadiness on feet: Secondary | ICD-10-CM | POA: Diagnosis not present

## 2018-01-11 DIAGNOSIS — E1159 Type 2 diabetes mellitus with other circulatory complications: Secondary | ICD-10-CM | POA: Diagnosis not present

## 2018-01-11 DIAGNOSIS — I5042 Chronic combined systolic (congestive) and diastolic (congestive) heart failure: Secondary | ICD-10-CM

## 2018-01-11 NOTE — Progress Notes (Signed)
Location:  [place] Room Number: 217-A Place of Service:  SNF (31) Provider:  Durenda Age, NP  Patient Care Team: Hendricks Limes, MD as PCP - General (Internal Medicine) Nahser, Wonda Cheng, MD as PCP - Cardiology (Cardiology) Thressa Sheller, MD as Consulting Physician (Internal Medicine) Fanny Skates, MD as Consulting Physician (General Surgery) Truitt Merle, MD as Consulting Physician (Hematology) Thea Silversmith, MD as Consulting Physician (Radiation Oncology) Mauro Kaufmann, RN as Registered Nurse Rockwell Germany, RN as Registered Nurse Jake Shark, Johny Blamer, NP as Nurse Practitioner (Nurse Practitioner) Nickola Major, NP as Nurse Practitioner (Internal Medicine)  Extended Emergency Contact Information Primary Emergency Contact: [employer] Address: [address] [place]          [place], [place] [address] Johnnette Litter of [place] Phone: [phone] Work Phone: [phone] Relation: Daughter Secondary Emergency Contact: Bartholome Bill Address: [place]          [place], [place] [address] Johnnette Litter of [place] Phone: [phone] Mobile Phone: [phone] Relation: Grandson  Code Status:  Full Code  Goals of care: Advanced Directive information Advanced Directives 11/22/2017  Does Patient Have a Medical Advance Directive? No  Type of Advance Directive -  Does patient want to make changes to medical advance directive? -  Copy of [place] in Chart? -  Would patient like information on creating a medical advance directive? No - Patient declined     Chief Complaint  Patient presents with  . Medical Management of Chronic Issues    Routine [facility] visit    HPI:  Chelsea [name] is an 84 y.o. female seen today for medical management of chronic diseases.  She is a long-term care resident of [facility].  She has a PMH of chronic osteomyelitis of her left foot,  hypertension, acute kidney failure, diabetes, and acute on chronic combined diastolic/systolic heart failure. She was seen in the room today. 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 Her left leg has ace wrap and CAM boots. She denies having pain on her left foot. BPs and HRs 97/59 60, 97/59 64, 104/66 62, low.   Past Medical History:  Diagnosis Date  . Anemia    takes Ferrous Sulfate daily  . Arthritis   . Basal cell carcinoma of breast 1998  . Bruises easily   . Dizziness   . DM (diabetes mellitus) ([place])    takes Metformin daily  . Eczema   . Emphysema lung ([place])   . GERD (gastroesophageal reflux disease)    takes Protonix daily  . Glaucoma    Bil  . History of hiatal hernia   . HTN (hypertension)    takes Diovan daily  . Hyperlipidemia   . ITP (idiopathic thrombocytopenic purpura) 2/09  . Myocardial infarction ([place])    N-stemi 2019  . Nocturia   . Osteoporosis   . Peripheral edema   . Stroke ([place]) 02/03/09   mid brain, diplopia   Past Surgical History:  Procedure Laterality Date  . ANKLE FUSION Left 09/26/2017   Procedure: LEFT TIB-CALC FUSION;  Surgeon: Newt Minion, MD;  Location: [place];  Service: Orthopedics;  Laterality: Left;  . BREAST LUMPECTOMY WITH RADIOACTIVE SEED LOCALIZATION Right 10/23/2014   Procedure: RIGHT BREAST LUMPECTOMY WITH RADIOACTIVE SEED LOCALIZATION;  Surgeon: Fanny Skates, MD;  Location: [place];  Service: General;  Laterality: Right;  . CATARACT EXTRACTION Bilateral   . COLONOSCOPY    . ESOPHAGOGASTRODUODENOSCOPY    .  LEFT HEART CATH AND CORONARY ANGIOGRAPHY N/A 07/09/2017   Procedure: LEFT HEART CATH AND CORONARY ANGIOGRAPHY;  Surgeon: Jettie Booze, MD;  Location: [place] [facility];  Service: Cardiovascular;  Laterality: N/A;  . MYOMECTOMY N/A 03/14/1995   late 90's per Chelsea [name]  . PARATHYROIDECTOMY  2008  . REFRACTIVE SURGERY Bilateral   . TONSILLECTOMY  as a child    Allergies  Allergen Reactions  . Travatan Z  [Travoprost (Bak Free)] Other (See Comments)    dizziness    Outpatient Encounter Medications as of 01/11/2018  Medication Sig  . acetaminophen (TYLENOL) 500 MG tablet Take 500 mg by mouth every 4 (four) hours as needed for mild pain.  . Amino Acids-Protein Hydrolys (FEEDING SUPPLEMENT, PRO-STAT SUGAR FREE 64,) LIQD Take 30 mLs by mouth daily.  Marland Kitchen aspirin 81 MG chewable tablet Chew 81 mg by mouth daily after breakfast.   . bisacodyl (DULCOLAX) 10 MG suppository Place 10 mg rectally daily as needed for moderate constipation (If not relieved by MOM).   . carvedilol (COREG) 3.125 MG tablet Take 1 tablet (3.125 mg total) by mouth 2 (two) times daily with a meal.  . Cholecalciferol 1000 units tablet Take 2,000 Units by mouth daily. Take 2 tablets to = 2000U  . Emollient (CETAPHIL) cream Apply 1 application topically daily. Wash left leg and apply Cetaphil cream and then reapply ACE wrap.  Apply to right leg for dry skin.  Marland Kitchen insulin glargine (LANTUS) 100 UNIT/ML injection Inject 5 Units into the skin at bedtime.   . isosorbide mononitrate (IMDUR) 30 MG 24 hr tablet Take 0.5 tablets (15 mg total) by mouth daily.  Marland Kitchen lactulose (CHRONULAC) 10 GM/15ML solution Take 30 g by mouth daily as needed for mild constipation.   Marland Kitchen losartan (COZAAR) 25 MG tablet Take 25 mg by mouth daily.  . magnesium hydroxide (MILK OF MAGNESIA) 400 MG/5ML suspension Take 30 mLs by mouth daily as needed for mild constipation (If no BM in 3 days).  . magnesium oxide (MAG-OX) 400 (241.3 Mg) MG tablet Take 1 tablet (400 mg total) by mouth 2 (two) times daily.  . Multiple Vitamins-Minerals (MULTIVITAMIN PO) Take 1 tablet by mouth daily.  . nitroGLYCERIN (NITROSTAT) 0.4 MG SL tablet Place 1 tablet (0.4 mg total) under the tongue every 5 (five) minutes x 3 doses as needed for chest pain.  . Omega-3 Fatty Acids (FISH OIL) 1000 MG CAPS Take 1,000 mg by mouth 2 (two) times daily.   . pantoprazole (PROTONIX) 40 MG tablet Take 40 mg by mouth  daily after breakfast.   . polyethylene glycol (MIRALAX / GLYCOLAX) packet Take 17 g by mouth daily.  . polyvinyl alcohol (LIQUIFILM TEARS) 1.4 % ophthalmic solution Place 1 drop into both eyes 2 (two) times daily.  . sennosides-docusate sodium (SENOKOT-S) 8.6-50 MG tablet Take 2 tablets by mouth 2 (two) times daily.  . Sodium Phosphates (RA SALINE ENEMA) 19-7 GM/118ML ENEM Place 1 each rectally daily as needed (for constipation. If not relieved by Bisacodyl Suppository).   . [DISCONTINUED] acetaminophen (TYLENOL) 325 MG tablet Take 650 mg by mouth every 4 (four) hours as needed for mild pain.   No facility-administered encounter medications on file as of 01/11/2018.     Review of Systems  GENERAL: No change in appetite, no fatigue, no weight changes, no fever, chills or weakness MOUTH and THROAT: Denies oral discomfort, gingival pain or bleeding RESPIRATORY: no cough, SOB, DOE, wheezing, hemoptysis CARDIAC: No chest pain, edema or palpitations GI: No  abdominal pain, diarrhea, constipation, heart burn, nausea or vomiting GU: Denies dysuria, frequency, hematuria, incontinence, or discharge PSYCHIATRIC: Denies feelings of depression or anxiety. No report of hallucinations, insomnia, paranoia, or agitation   Immunization History  Administered Date(s) Administered  . Influenza Split 01/14/2011  . Influenza Whole 01/27/2010  . Influenza-Unspecified 01/09/2011, 11/06/2016  . Pneumococcal Conjugate-13 06/26/2017  . Pneumococcal Polysaccharide-23 07/04/2007  . Tdap 11/23/2017   Pertinent  Health Maintenance Due  Topic Date Due  . FOOT EXAM  09/02/1938  . INFLUENZA VACCINE  09/06/2017  . HEMOGLOBIN A1C  03/24/2018  . OPHTHALMOLOGY EXAM  11/13/2018  . DEXA SCAN  Completed  . PNA vac Low Risk Adult  Completed   Fall Risk  11/22/2017 09/10/2017 07/17/2017 06/14/2017 05/10/2017  Falls in the past year? No Yes Yes Yes Yes  Number falls in past yr: - 1 1 - 1  Injury with Fall? - Yes Yes Yes No    Comment - - - - "fall in hallway at home"  Risk for fall due to : - History of fall(s) - - Impaired balance/gait     Vitals:   01/11/18 0836  BP: (!) 97/59  Pulse: 60  Resp: 16  Temp: (!) 97.2 F (36.2 C)  TempSrc: Oral  SpO2: 98%  Weight: 166 lb 12.8 oz (75.7 kg)  Height: 5\' 2"  (1.575 m)   Body mass index is 30.51 kg/m.  Physical Exam  GENERAL APPEARANCE: Well nourished. In no acute distress. Obese SKIN:  Skin is warm and dry.  MOUTH and THROAT: Lips are without lesions. Oral mucosa is moist and without lesions. RESPIRATORY: Breathing is even & unlabored, BS CTAB CARDIAC: RRR, no murmur,no extra heart sounds, no edema GI: Abdomen soft, normal BS, no masses, no tenderness EXTREMITIES:  LLE has CAM boot NEUROLOGICAL: There is no tremor. Speech is clear PSYCHIATRIC: Alert and oriented X 3. Affect and behavior are appropriate  Labs reviewed: Recent Labs    05/17/17 0313 05/18/17 0419  05/27/17 0247 05/28/17 0342 05/29/17 0349  07/09/17 1000 08/13/17 09/26/17 0847  NA 133* 132*   < > 136 132* 135   < > 139 141 142  K 5.2* 5.6*   < > 4.5 3.9 4.0   < > 4.0 4.1 4.0  CL 104 103   < > 103 100* 103  --  106  --  107  CO2 18* 18*   < > 19* 19* 20*  --  26  --  27  GLUCOSE 148* 136*   < > 118* 108* 119*  --  125*  --  113*  BUN 57* 74*   < > 117* 102* 105*   < > 13 22* 23  CREATININE 5.78* 6.29*   < > 4.86* 4.17* 3.76*   < > 1.00 0.9 0.88  CALCIUM 7.8* 8.3*   < > 8.6* 8.5* 8.6*  --  9.2  --  9.4  MG 1.8 1.7  --   --   --  1.2*  --   --   --   --   PHOS  --  6.8*   < > 6.8* 5.5* 5.5*  --   --   --   --    < > = values in this interval not displayed.   Recent Labs    04/17/17 1146 05/17/17 0313  05/21/17 0317  05/27/17 0247 05/28/17 0342 05/29/17 0349  AST 21 144*  --  25  --   --   --   --  ALT 14 127*  --  60*  --   --   --   --   ALKPHOS  --  73  --  56  --   --   --   --   BILITOT 0.5 0.6  --  0.6  --   --   --   --   PROT 6.5 5.0*  --  5.1*  --   --   --   --    ALBUMIN  --  1.9*   < > 1.8*   < > 2.2* 2.2* 2.2*   < > = values in this interval not displayed.   Recent Labs    05/12/17 2326  05/13/17 0944  05/26/17 0312 05/28/17 0342 06/26/17 09/26/17 0847  WBC 9.6  --  11.9*   < > 8.0 7.7 5.0 5.4  NEUTROABS 8.1*  --  9.2*  --   --   --  3  --   HGB 16.3*   < > 12.1   < > 9.9* 9.7* 11.7* 14.3  HCT 49.5*   < > 37.5   < > 28.8* 29.9* 35* 44.4  MCV 85.3  --  84.7   < > 83.5 84.7  --  88.3  PLT 214  --  234   < > 301 288 195 153   < > = values in this interval not displayed.   Lab Results  Component Value Date   TSH 2.057 Test methodology is 3rd generation TSH 02/04/2009   Lab Results  Component Value Date   HGBA1C 7.1 (A) 09/21/2017   Lab Results  Component Value Date   CHOL 113 05/13/2017   HDL 52 05/13/2017   LDLCALC 55 05/13/2017   TRIG 28 05/13/2017   CHOLHDL 2.2 05/13/2017    Assessment/Plan  1. Other specified hypotension - BPs and HRs low, will decrease Carvedilol 3.125 mg to 1/2 tab = 1.56 mg BID, BP/HR BID X 1 week   2. Type 2 diabetes mellitus with other circulatory complication, with long-term current use of insulin (HCC) - well-controlled, continue Lantus 100 units/mL inject 5 units subcutaneously nightly   3. Chronic combined systolic and diastolic heart failure (HCC) -no SOB, continue isosorbide mononitrate ER 30 mg 1 tab daily, losartan 25 mg 1 tab daily, will decrease carvedilol 3.125 mg to 1/2 tab = 1.56 mg BID due to hypotension   4. Gastroesophageal reflux disease, esophagitis presence not specified -stable, continue pantoprazole 40 mg 1 tab daily    Family/ staff Communication: Discussed plan of care with resident.  Labs/tests ordered:  None  Goals of care:  Long-term care.   Durenda Age, NP [facility] [phone] (Monday-Friday 8:00 a.m. - 5:00 p.m.) [phone] (after hours)

## 2018-01-14 ENCOUNTER — Ambulatory Visit (INDEPENDENT_AMBULATORY_CARE_PROVIDER_SITE_OTHER): Payer: Medicare Other | Admitting: Physician Assistant

## 2018-01-14 ENCOUNTER — Ambulatory Visit (INDEPENDENT_AMBULATORY_CARE_PROVIDER_SITE_OTHER): Payer: Medicare Other

## 2018-01-14 ENCOUNTER — Encounter (INDEPENDENT_AMBULATORY_CARE_PROVIDER_SITE_OTHER): Payer: Self-pay | Admitting: Orthopedic Surgery

## 2018-01-14 VITALS — Ht 62.0 in | Wt 166.8 lb

## 2018-01-14 DIAGNOSIS — R2681 Unsteadiness on feet: Secondary | ICD-10-CM | POA: Diagnosis not present

## 2018-01-14 DIAGNOSIS — E1142 Type 2 diabetes mellitus with diabetic polyneuropathy: Secondary | ICD-10-CM

## 2018-01-14 DIAGNOSIS — Z981 Arthrodesis status: Secondary | ICD-10-CM

## 2018-01-14 DIAGNOSIS — M86672 Other chronic osteomyelitis, left ankle and foot: Secondary | ICD-10-CM | POA: Diagnosis not present

## 2018-01-14 DIAGNOSIS — R262 Difficulty in walking, not elsewhere classified: Secondary | ICD-10-CM | POA: Diagnosis not present

## 2018-01-14 NOTE — Progress Notes (Signed)
Office Visit Note   Patient: Chelsea [name]           Date of Birth: May 15, 1928           MRN: [id number] Visit Date: 01/14/2018              Requested by: Hendricks Limes, MD [address] [place], [place] [address] PCP: Hendricks Limes, MD  Chief Complaint  Patient presents with  . Left Ankle - Follow-up      HPI: The patient is a 84 year old woman who is here with her daughter for follow-up of her left ankle fusion on 09/26/2017.  She had a chronic left ankle fracture dislocation.  She is residing at [facility] facility and working with physical therapy weightbearing as tolerated in a fracture boot and reports no pain with weightbearing.  She has otherwise been doing well and is pleased with her progress.  Assessment & Plan: Visit Diagnoses:  1. S/P ankle fusion   2. Diabetic polyneuropathy associated with type 2 diabetes mellitus ([place])     Plan: The patient can begin wearing a regular shoe weightbearing as tolerated ambulating with a walker or other assistive device.  She can resume wearing her compression socks or continue Ace wrapping for edema control.  She will follow-up on an as-needed basis she was instructed that if she has any ankle pain that we do need to see her back and the daughter will assist her in coming back and if she were to need to come back.  Follow-Up Instructions: Return if symptoms worsen or fail to improve.   Ortho Exam  Patient is alert, oriented, no adenopathy, well-dressed, normal affect, normal respiratory effort. Left ankle is healing well and the incisions are all well-healed with minimal scar.  She is nontender to palpation.  The ankle is stable to palpation is improved as is the forefoot.  Foot is plantigrade.  There are no signs of cellulitis or infection.  She has good pedal pulses.  Imaging: No results found. No images are attached to the encounter.  Labs: Lab Results  Component Value Date   HGBA1C 7.1 (A) 09/21/2017   HGBA1C 6.8 (H) 04/17/2017   HGBA1C 7.2 (H) 10/20/2014   ESRSEDRATE 9 04/17/2017   ESRSEDRATE 11 02/04/2009   CRP 3.4 04/17/2017   CRP 0.8 (H) 02/04/2009   REPTSTATUS 05/18/2017 FINAL 05/12/2017   GRAMSTAIN  03/08/2017    NO WBC SEEN FEW GRAM NEGATIVE RODS FEW GRAM POSITIVE COCCI IN PAIRS RARE GRAM POSITIVE RODS Performed at [facility], [place] [address], [place], [place] [address]    CULT  05/12/2017    NO GROWTH 5 DAYS Performed at [facility] [address], [place], [place] [address]    [place] 03/08/2017   LABORGA ENTEROCOCCUS FAECALIS 03/08/2017     Lab Results  Component Value Date   ALBUMIN 2.2 (L) 05/29/2017   ALBUMIN 2.2 (L) 05/28/2017   ALBUMIN 2.2 (L) 05/27/2017    Body mass index is 30.51 kg/m.  Orders:  Orders Placed This Encounter  Procedures  . XR Ankle 2 Views Left   No orders of the defined types were placed in this encounter.    Procedures: No procedures performed  Clinical Data: No additional findings.  ROS:  All other systems negative, except as noted in the HPI. Review of Systems  Objective: Vital Signs: Ht 5\' 2"  (1.575 m)   Wt 166 lb 12.8 oz (75.7 kg)  BMI 30.51 kg/m   Specialty Comments:  No specialty comments available.  PMFS History: Patient Active Problem List   Diagnosis Date Noted  . Closed fracture dislocation of left ankle joint   . Pressure injury of skin 05/16/2017  . Acute kidney failure ([place])   . Acute on chronic combined systolic and diastolic CHF (congestive heart failure) ([place])   . NSTEMI (non-ST elevated myocardial infarction) ([place]) 05/13/2017  . HTN (hypertension) 05/13/2017  . Diabetes mellitus ([place]) 04/10/2017  . Venous stasis dermatitis of both lower extremities 04/10/2017  . Neuropathy 04/10/2017  . Chronic osteomyelitis of left foot ([place]) 04/10/2017  . Breast cancer of upper-inner quadrant of right female breast ([place]) 10/05/2014  . Occult blood positive stool  03/24/2014  . History of ITP 04/14/2011   Past Medical History:  Diagnosis Date  . Anemia    takes Ferrous Sulfate daily  . Arthritis   . Basal cell carcinoma of breast 1998  . Bruises easily   . Dizziness   . DM (diabetes mellitus) ([place])    takes Metformin daily  . Eczema   . Emphysema lung ([place])   . GERD (gastroesophageal reflux disease)    takes Protonix daily  . Glaucoma    Bil  . History of hiatal hernia   . HTN (hypertension)    takes Diovan daily  . Hyperlipidemia   . ITP (idiopathic thrombocytopenic purpura) 2/09  . Myocardial infarction ([place])    N-stemi 2019  . Nocturia   . Osteoporosis   . Peripheral edema   . Stroke ([place]) 02/03/09   mid brain, diplopia    Family History  Problem Relation Age of Onset  . Breast cancer Mother 57  . Colon cancer Father   . Diabetes Father        questionable  . Multiple sclerosis Daughter   . Heart disease Maternal Grandfather     Past Surgical History:  Procedure Laterality Date  . ANKLE FUSION Left 09/26/2017   Procedure: LEFT TIB-CALC FUSION;  Surgeon: Newt Minion, MD;  Location: [facility];  Service: Orthopedics;  Laterality: Left;  . BREAST LUMPECTOMY WITH RADIOACTIVE SEED LOCALIZATION Right 10/23/2014   Procedure: RIGHT BREAST LUMPECTOMY WITH RADIOACTIVE SEED LOCALIZATION;  Surgeon: Fanny Skates, MD;  Location: [facility];  Service: General;  Laterality: Right;  . CATARACT EXTRACTION Bilateral   . COLONOSCOPY    . ESOPHAGOGASTRODUODENOSCOPY    . LEFT HEART CATH AND CORONARY ANGIOGRAPHY N/A 07/09/2017   Procedure: LEFT HEART CATH AND CORONARY ANGIOGRAPHY;  Surgeon: Jettie Booze, MD;  Location: [facility];  Service: Cardiovascular;  Laterality: N/A;  . MYOMECTOMY N/A 03/14/1995   late 90's per pt  . PARATHYROIDECTOMY  2008  . REFRACTIVE SURGERY Bilateral   . TONSILLECTOMY  as a child   Social History   Occupational History  . Not on file  Tobacco Use  . Smoking status: Never Smoker  . Smokeless tobacco:  Never Used  Substance and Sexual Activity  . Alcohol use: No    Alcohol/week: 0.0 standard drinks  . Drug use: No  . Sexual activity: Not on file

## 2018-01-15 DIAGNOSIS — R262 Difficulty in walking, not elsewhere classified: Secondary | ICD-10-CM | POA: Diagnosis not present

## 2018-01-15 DIAGNOSIS — M86672 Other chronic osteomyelitis, left ankle and foot: Secondary | ICD-10-CM | POA: Diagnosis not present

## 2018-01-15 DIAGNOSIS — R2681 Unsteadiness on feet: Secondary | ICD-10-CM | POA: Diagnosis not present

## 2018-01-16 DIAGNOSIS — R262 Difficulty in walking, not elsewhere classified: Secondary | ICD-10-CM | POA: Diagnosis not present

## 2018-01-16 DIAGNOSIS — M86672 Other chronic osteomyelitis, left ankle and foot: Secondary | ICD-10-CM | POA: Diagnosis not present

## 2018-01-16 DIAGNOSIS — R2681 Unsteadiness on feet: Secondary | ICD-10-CM | POA: Diagnosis not present

## 2018-01-17 DIAGNOSIS — R262 Difficulty in walking, not elsewhere classified: Secondary | ICD-10-CM | POA: Diagnosis not present

## 2018-01-17 DIAGNOSIS — R2681 Unsteadiness on feet: Secondary | ICD-10-CM | POA: Diagnosis not present

## 2018-01-17 DIAGNOSIS — M86672 Other chronic osteomyelitis, left ankle and foot: Secondary | ICD-10-CM | POA: Diagnosis not present

## 2018-01-18 DIAGNOSIS — M86672 Other chronic osteomyelitis, left ankle and foot: Secondary | ICD-10-CM | POA: Diagnosis not present

## 2018-01-18 DIAGNOSIS — R262 Difficulty in walking, not elsewhere classified: Secondary | ICD-10-CM | POA: Diagnosis not present

## 2018-01-18 DIAGNOSIS — R2681 Unsteadiness on feet: Secondary | ICD-10-CM | POA: Diagnosis not present

## 2018-01-21 DIAGNOSIS — R262 Difficulty in walking, not elsewhere classified: Secondary | ICD-10-CM | POA: Diagnosis not present

## 2018-01-21 DIAGNOSIS — R2681 Unsteadiness on feet: Secondary | ICD-10-CM | POA: Diagnosis not present

## 2018-01-21 DIAGNOSIS — M86672 Other chronic osteomyelitis, left ankle and foot: Secondary | ICD-10-CM | POA: Diagnosis not present

## 2018-01-22 DIAGNOSIS — M86672 Other chronic osteomyelitis, left ankle and foot: Secondary | ICD-10-CM | POA: Diagnosis not present

## 2018-01-22 DIAGNOSIS — R2681 Unsteadiness on feet: Secondary | ICD-10-CM | POA: Diagnosis not present

## 2018-01-22 DIAGNOSIS — R262 Difficulty in walking, not elsewhere classified: Secondary | ICD-10-CM | POA: Diagnosis not present

## 2018-01-23 DIAGNOSIS — R262 Difficulty in walking, not elsewhere classified: Secondary | ICD-10-CM | POA: Diagnosis not present

## 2018-01-23 DIAGNOSIS — R2681 Unsteadiness on feet: Secondary | ICD-10-CM | POA: Diagnosis not present

## 2018-01-23 DIAGNOSIS — M86672 Other chronic osteomyelitis, left ankle and foot: Secondary | ICD-10-CM | POA: Diagnosis not present

## 2018-01-24 DIAGNOSIS — M86672 Other chronic osteomyelitis, left ankle and foot: Secondary | ICD-10-CM | POA: Diagnosis not present

## 2018-01-24 DIAGNOSIS — R2681 Unsteadiness on feet: Secondary | ICD-10-CM | POA: Diagnosis not present

## 2018-01-24 DIAGNOSIS — R262 Difficulty in walking, not elsewhere classified: Secondary | ICD-10-CM | POA: Diagnosis not present

## 2018-01-25 DIAGNOSIS — M86672 Other chronic osteomyelitis, left ankle and foot: Secondary | ICD-10-CM | POA: Diagnosis not present

## 2018-01-25 DIAGNOSIS — R2681 Unsteadiness on feet: Secondary | ICD-10-CM | POA: Diagnosis not present

## 2018-01-25 DIAGNOSIS — R262 Difficulty in walking, not elsewhere classified: Secondary | ICD-10-CM | POA: Diagnosis not present

## 2018-01-28 DIAGNOSIS — M86672 Other chronic osteomyelitis, left ankle and foot: Secondary | ICD-10-CM | POA: Diagnosis not present

## 2018-01-28 DIAGNOSIS — R262 Difficulty in walking, not elsewhere classified: Secondary | ICD-10-CM | POA: Diagnosis not present

## 2018-01-28 DIAGNOSIS — R2681 Unsteadiness on feet: Secondary | ICD-10-CM | POA: Diagnosis not present

## 2018-01-29 DIAGNOSIS — R2681 Unsteadiness on feet: Secondary | ICD-10-CM | POA: Diagnosis not present

## 2018-01-29 DIAGNOSIS — R262 Difficulty in walking, not elsewhere classified: Secondary | ICD-10-CM | POA: Diagnosis not present

## 2018-01-29 DIAGNOSIS — M86672 Other chronic osteomyelitis, left ankle and foot: Secondary | ICD-10-CM | POA: Diagnosis not present

## 2018-01-31 DIAGNOSIS — R2681 Unsteadiness on feet: Secondary | ICD-10-CM | POA: Diagnosis not present

## 2018-01-31 DIAGNOSIS — R262 Difficulty in walking, not elsewhere classified: Secondary | ICD-10-CM | POA: Diagnosis not present

## 2018-01-31 DIAGNOSIS — M86672 Other chronic osteomyelitis, left ankle and foot: Secondary | ICD-10-CM | POA: Diagnosis not present

## 2018-02-01 DIAGNOSIS — R262 Difficulty in walking, not elsewhere classified: Secondary | ICD-10-CM | POA: Diagnosis not present

## 2018-02-01 DIAGNOSIS — M86672 Other chronic osteomyelitis, left ankle and foot: Secondary | ICD-10-CM | POA: Diagnosis not present

## 2018-02-01 DIAGNOSIS — R2681 Unsteadiness on feet: Secondary | ICD-10-CM | POA: Diagnosis not present

## 2018-02-04 DIAGNOSIS — M86672 Other chronic osteomyelitis, left ankle and foot: Secondary | ICD-10-CM | POA: Diagnosis not present

## 2018-02-04 DIAGNOSIS — R262 Difficulty in walking, not elsewhere classified: Secondary | ICD-10-CM | POA: Diagnosis not present

## 2018-02-04 DIAGNOSIS — R2681 Unsteadiness on feet: Secondary | ICD-10-CM | POA: Diagnosis not present

## 2018-02-05 DIAGNOSIS — M86672 Other chronic osteomyelitis, left ankle and foot: Secondary | ICD-10-CM | POA: Diagnosis not present

## 2018-02-05 DIAGNOSIS — R262 Difficulty in walking, not elsewhere classified: Secondary | ICD-10-CM | POA: Diagnosis not present

## 2018-02-05 DIAGNOSIS — R2681 Unsteadiness on feet: Secondary | ICD-10-CM | POA: Diagnosis not present

## 2018-02-06 DIAGNOSIS — M86672 Other chronic osteomyelitis, left ankle and foot: Secondary | ICD-10-CM | POA: Diagnosis not present

## 2018-02-06 DIAGNOSIS — R262 Difficulty in walking, not elsewhere classified: Secondary | ICD-10-CM | POA: Diagnosis not present

## 2018-02-06 DIAGNOSIS — R2681 Unsteadiness on feet: Secondary | ICD-10-CM | POA: Diagnosis not present

## 2018-02-07 DIAGNOSIS — R262 Difficulty in walking, not elsewhere classified: Secondary | ICD-10-CM | POA: Diagnosis not present

## 2018-02-07 DIAGNOSIS — R2681 Unsteadiness on feet: Secondary | ICD-10-CM | POA: Diagnosis not present

## 2018-02-07 DIAGNOSIS — M86672 Other chronic osteomyelitis, left ankle and foot: Secondary | ICD-10-CM | POA: Diagnosis not present

## 2018-02-09 DIAGNOSIS — M86672 Other chronic osteomyelitis, left ankle and foot: Secondary | ICD-10-CM | POA: Diagnosis not present

## 2018-02-09 DIAGNOSIS — R2681 Unsteadiness on feet: Secondary | ICD-10-CM | POA: Diagnosis not present

## 2018-02-09 DIAGNOSIS — R262 Difficulty in walking, not elsewhere classified: Secondary | ICD-10-CM | POA: Diagnosis not present

## 2018-02-11 DIAGNOSIS — M86672 Other chronic osteomyelitis, left ankle and foot: Secondary | ICD-10-CM | POA: Diagnosis not present

## 2018-02-11 DIAGNOSIS — R262 Difficulty in walking, not elsewhere classified: Secondary | ICD-10-CM | POA: Diagnosis not present

## 2018-02-11 DIAGNOSIS — R2681 Unsteadiness on feet: Secondary | ICD-10-CM | POA: Diagnosis not present

## 2018-02-12 DIAGNOSIS — R262 Difficulty in walking, not elsewhere classified: Secondary | ICD-10-CM | POA: Diagnosis not present

## 2018-02-12 DIAGNOSIS — M86672 Other chronic osteomyelitis, left ankle and foot: Secondary | ICD-10-CM | POA: Diagnosis not present

## 2018-02-12 DIAGNOSIS — R2681 Unsteadiness on feet: Secondary | ICD-10-CM | POA: Diagnosis not present

## 2018-02-13 DIAGNOSIS — R2681 Unsteadiness on feet: Secondary | ICD-10-CM | POA: Diagnosis not present

## 2018-02-13 DIAGNOSIS — R262 Difficulty in walking, not elsewhere classified: Secondary | ICD-10-CM | POA: Diagnosis not present

## 2018-02-13 DIAGNOSIS — M86672 Other chronic osteomyelitis, left ankle and foot: Secondary | ICD-10-CM | POA: Diagnosis not present

## 2018-02-14 DIAGNOSIS — R2681 Unsteadiness on feet: Secondary | ICD-10-CM | POA: Diagnosis not present

## 2018-02-14 DIAGNOSIS — M86672 Other chronic osteomyelitis, left ankle and foot: Secondary | ICD-10-CM | POA: Diagnosis not present

## 2018-02-14 DIAGNOSIS — R262 Difficulty in walking, not elsewhere classified: Secondary | ICD-10-CM | POA: Diagnosis not present

## 2018-02-15 DIAGNOSIS — R262 Difficulty in walking, not elsewhere classified: Secondary | ICD-10-CM | POA: Diagnosis not present

## 2018-02-15 DIAGNOSIS — R2681 Unsteadiness on feet: Secondary | ICD-10-CM | POA: Diagnosis not present

## 2018-02-15 DIAGNOSIS — M86672 Other chronic osteomyelitis, left ankle and foot: Secondary | ICD-10-CM | POA: Diagnosis not present

## 2018-02-16 DIAGNOSIS — M86672 Other chronic osteomyelitis, left ankle and foot: Secondary | ICD-10-CM | POA: Diagnosis not present

## 2018-02-16 DIAGNOSIS — R2681 Unsteadiness on feet: Secondary | ICD-10-CM | POA: Diagnosis not present

## 2018-02-16 DIAGNOSIS — R262 Difficulty in walking, not elsewhere classified: Secondary | ICD-10-CM | POA: Diagnosis not present

## 2018-02-18 DIAGNOSIS — R262 Difficulty in walking, not elsewhere classified: Secondary | ICD-10-CM | POA: Diagnosis not present

## 2018-02-18 DIAGNOSIS — M86672 Other chronic osteomyelitis, left ankle and foot: Secondary | ICD-10-CM | POA: Diagnosis not present

## 2018-02-18 DIAGNOSIS — R2681 Unsteadiness on feet: Secondary | ICD-10-CM | POA: Diagnosis not present

## 2018-02-27 ENCOUNTER — Non-Acute Institutional Stay (SKILLED_NURSING_FACILITY): Payer: Medicare Other | Admitting: Adult Health

## 2018-02-27 ENCOUNTER — Encounter: Payer: Self-pay | Admitting: Adult Health

## 2018-02-27 DIAGNOSIS — E1159 Type 2 diabetes mellitus with other circulatory complications: Secondary | ICD-10-CM

## 2018-02-27 DIAGNOSIS — Z7189 Other specified counseling: Secondary | ICD-10-CM | POA: Diagnosis not present

## 2018-02-27 DIAGNOSIS — I10 Essential (primary) hypertension: Secondary | ICD-10-CM

## 2018-02-27 DIAGNOSIS — Z794 Long term (current) use of insulin: Secondary | ICD-10-CM | POA: Diagnosis not present

## 2018-02-27 DIAGNOSIS — B372 Candidiasis of skin and nail: Secondary | ICD-10-CM | POA: Diagnosis not present

## 2018-02-27 NOTE — Progress Notes (Signed)
Location:  [facility] Room Number: 217-A Place of Service:  SNF (31) Provider:  Durenda Age, NP  Patient Care Team: Hendricks Limes, MD as PCP - General (Internal Medicine) Nahser, Wonda Cheng, MD as PCP - Cardiology (Cardiology) Thressa Sheller, MD (Inactive) as Consulting Physician (Internal Medicine) Fanny Skates, MD as Consulting Physician (General Surgery) Truitt Merle, MD as Consulting Physician (Hematology) Thea Silversmith, MD as Consulting Physician (Radiation Oncology) Mauro Kaufmann, RN as Registered Nurse Rockwell Germany, RN as Registered Nurse Jake Shark, Johny Blamer, NP as Nurse Practitioner (Nurse Practitioner) Nickola Major, NP as Nurse Practitioner (Internal Medicine)  Extended Emergency Contact Information Primary Emergency Contact: [facility] Address: [address] [place]          [place], [place] [address] Johnnette Litter of [place] Phone: [phone] Work Phone: [phone] Relation: Daughter Secondary Emergency Contact: Bartholome Bill Address: [place]          [place], [place] [address] Johnnette Litter of [place] Phone: [phone] Mobile Phone: [phone] Relation: Grandson  Code Status:  Full Code  Goals of care: Advanced Directive information Advanced Directives 11/22/2017  Does Patient Have a Medical Advance Directive? No  Type of Advance Directive -  Does patient want to make changes to medical advance directive? -  Copy of [place] in Chart? -  Would patient like information on creating a medical advance directive? No - Patient declined     Chief Complaint  Patient presents with  . Medical Management of Chronic Issues    Routine [facility] visit  . Advanced Directive    Care Plan Meeting    HPI:  Pt is an 85 y.o. female seen today for medical management of chronic diseases, as well as a care plan meeting.  She is a long-term care resident of  [facility].  She has a PMH of chronic osteomyelitis of her left foot, hypertension, acute kidney failure, diabetes, and acute on chronic combined diastolic/systolic heart failure. Care plan meeting was attended by resident, daughter, social worker, MDS coordinator,  activity personnel and NP.  She wants to continue to be full code.  Resident is interested in going to an ALF and was going through the list of the things needed for the transfer. A representative from the ALF will visit her tomorrow, 02/28/18. Sh ewas noted to have ACE wrap on her left foot which had a chronic left ankle dislocation. She is now weightbearing with a regular shoe on left foot. The meeting lasted for 20 minutes.    Past Medical History:  Diagnosis Date  . Anemia    takes Ferrous Sulfate daily  . Arthritis   . Basal cell carcinoma of breast 1998  . Bruises easily   . Dizziness   . DM (diabetes mellitus) ([place])    takes Metformin daily  . Eczema   . Emphysema lung ([place])   . GERD (gastroesophageal reflux disease)    takes Protonix daily  . Glaucoma    Bil  . History of hiatal hernia   . HTN (hypertension)    takes Diovan daily  . Hyperlipidemia   . ITP (idiopathic thrombocytopenic purpura) 2/09  . Myocardial infarction ([place])    N-stemi 2019  . Nocturia   . Osteoporosis   . Peripheral edema   . Stroke ([place]) 02/03/09   mid brain, diplopia   Past Surgical History:  Procedure Laterality Date  . ANKLE FUSION Left 09/26/2017   Procedure: LEFT  TIB-CALC FUSION;  Surgeon: Newt Minion, MD;  Location: [facility];  Service: Orthopedics;  Laterality: Left;  . BREAST LUMPECTOMY WITH RADIOACTIVE SEED LOCALIZATION Right 10/23/2014   Procedure: RIGHT BREAST LUMPECTOMY WITH RADIOACTIVE SEED LOCALIZATION;  Surgeon: Fanny Skates, MD;  Location: [facility];  Service: General;  Laterality: Right;  . CATARACT EXTRACTION Bilateral   . COLONOSCOPY    . ESOPHAGOGASTRODUODENOSCOPY    . LEFT HEART CATH AND  CORONARY ANGIOGRAPHY N/A 07/09/2017   Procedure: LEFT HEART CATH AND CORONARY ANGIOGRAPHY;  Surgeon: Jettie Booze, MD;  Location: [facility];  Service: Cardiovascular;  Laterality: N/A;  . MYOMECTOMY N/A 03/14/1995   late 90's per pt  . PARATHYROIDECTOMY  2008  . REFRACTIVE SURGERY Bilateral   . TONSILLECTOMY  as a child    Allergies  Allergen Reactions  . Travatan Z [Travoprost (Bak Free)] Other (See Comments)    dizziness    Outpatient Encounter Medications as of 02/27/2018  Medication Sig  . acetaminophen (TYLENOL) 500 MG tablet Take 500 mg by mouth every 4 (four) hours as needed for mild pain.  . Amino Acids-Protein Hydrolys (FEEDING SUPPLEMENT, PRO-STAT SUGAR FREE 64,) LIQD Take 30 mLs by mouth daily.  Marland Kitchen aspirin 81 MG chewable tablet Chew 81 mg by mouth daily after breakfast.   . bisacodyl (DULCOLAX) 10 MG suppository Place 10 mg rectally daily as needed for moderate constipation (If not relieved by MOM).   . carvedilol (COREG) 3.125 MG tablet Take 1 tablet (3.125 mg total) by mouth 2 (two) times daily with a meal.  . Cholecalciferol 50 MCG (2000 UT) TABS Take 2,000 Units by mouth daily.  . Emollient (CETAPHIL) cream Apply 1 application topically daily. Wash left leg and apply Cetaphil cream and then reapply ACE wrap.  Apply to right leg for dry skin.  . hydrOXYzine (ATARAX/VISTARIL) 10 MG tablet Take 10 mg by mouth every 12 (twelve) hours as needed for itching.  . insulin glargine (LANTUS) 100 UNIT/ML injection Inject 5 Units into the skin at bedtime.   . isosorbide mononitrate (IMDUR) 30 MG 24 hr tablet Take 0.5 tablets (15 mg total) by mouth daily.  Marland Kitchen lactulose (CHRONULAC) 10 GM/15ML solution Take 30 g by mouth daily as needed for mild constipation.   Marland Kitchen losartan (COZAAR) 25 MG tablet Take 25 mg by mouth daily.  . magnesium hydroxide (MILK OF MAGNESIA) 400 MG/5ML suspension Take 30 mLs by mouth daily as needed for mild constipation (If no BM in 3 days).  . magnesium  oxide (MAG-OX) 400 (241.3 Mg) MG tablet Take 1 tablet (400 mg total) by mouth 2 (two) times daily.  . miconazole (MICOTIN) 2 % cream Apply 1 application topically 2 (two) times daily.  . Multiple Vitamins-Minerals (MULTIVITAMIN PO) Take 1 tablet by mouth daily.  . nitroGLYCERIN (NITROSTAT) 0.4 MG SL tablet Place 1 tablet (0.4 mg total) under the tongue every 5 (five) minutes x 3 doses as needed for chest pain.  . Omega-3 Fatty Acids (FISH OIL) 1000 MG CAPS Take 1,000 mg by mouth 2 (two) times daily.   . pantoprazole (PROTONIX) 40 MG tablet Take 40 mg by mouth daily after breakfast.   . polyethylene glycol (MIRALAX / GLYCOLAX) packet Take 17 g by mouth daily.  . polyvinyl alcohol (LIQUIFILM TEARS) 1.4 % ophthalmic solution Place 1 drop into both eyes 2 (two) times daily.  . sennosides-docusate sodium (SENOKOT-S) 8.6-50 MG tablet Take 2 tablets by mouth 2 (two) times daily.  . Sodium Phosphates (RA SALINE  ENEMA) 19-7 GM/118ML ENEM Place 1 each rectally daily as needed (for constipation. If not relieved by Bisacodyl Suppository).   . [DISCONTINUED] Cholecalciferol 1000 units tablet Take 2,000 Units by mouth daily. Take 2 tablets to = 2000U   No facility-administered encounter medications on file as of 02/27/2018.     Review of Systems  GENERAL: No change in appetite, no fatigue, no weight changes, no fever, chills or weakness MOUTH and THROAT: Denies oral discomfort, gingival pain or bleeding, pain from teeth or hoarseness   RESPIRATORY: no cough, SOB, DOE, wheezing, hemoptysis CARDIAC: No chest pain, edema or palpitations GI: No abdominal pain, diarrhea, constipation, heart burn, nausea or vomiting GU: Denies dysuria, frequency, hematuria, incontinence, or discharge NEUROLOGICAL: Denies dizziness, syncope, numbness, or headache PSYCHIATRIC: Denies feelings of depression or anxiety. No report of hallucinations, insomnia, paranoia, or agitation    Immunization History  Administered Date(s)  Administered  . Influenza Split 01/14/2011  . Influenza Whole 01/27/2010  . Influenza-Unspecified 01/09/2011, 11/06/2016, 02/05/2018  . Pneumococcal Conjugate-13 06/26/2017  . Pneumococcal Polysaccharide-23 07/04/2007  . Tdap 11/23/2017   Pertinent  Health Maintenance Due  Topic Date Due  . FOOT EXAM  09/02/1938  . HEMOGLOBIN A1C  03/24/2018  . OPHTHALMOLOGY EXAM  11/13/2018  . INFLUENZA VACCINE  Completed  . DEXA SCAN  Completed  . PNA vac Low Risk Adult  Completed   Fall Risk  11/22/2017 09/10/2017 07/17/2017 06/14/2017 05/10/2017  Falls in the past year? No Yes Yes Yes Yes  Number falls in past yr: - 1 1 - 1  Injury with Fall? - Yes Yes Yes No  Comment - - - - "fall in hallway at home"  Risk for fall due to : - History of fall(s) - - Impaired balance/gait      Vitals:   02/27/18 0914  Weight: 167 lb 9.6 oz (76 kg)  Height: 5\' 2"  (1.575 m)   Body mass index is 30.65 kg/m.  Physical Exam  GENERAL APPEARANCE: Well nourished. In no acute distress. Obese SKIN:  Has erythematous rashes under bilateral breasts MOUTH and THROAT: Lips are without lesions. Oral mucosa is moist and without lesions. RESPIRATORY: Breathing is even & unlabored, BS CTAB CARDIAC: RRR, no murmur,no extra heart sounds, no edema GI: Abdomen soft, normal BS, no masses, no tenderness EXTREMITIES:  Able to move X 4 extremities NEUROLOGICAL: There is no tremor. Speech is clear. Alert and oriented X 3. PSYCHIATRIC:  Affect and behavior are appropriate   Labs reviewed: Recent Labs    05/17/17 0313 05/18/17 0419  05/27/17 0247 05/28/17 0342 05/29/17 0349  07/09/17 1000 08/13/17 09/26/17 0847  NA 133* 132*   < > 136 132* 135   < > 139 141 142  K 5.2* 5.6*   < > 4.5 3.9 4.0   < > 4.0 4.1 4.0  CL 104 103   < > 103 100* 103  --  106  --  107  CO2 18* 18*   < > 19* 19* 20*  --  26  --  27  GLUCOSE 148* 136*   < > 118* 108* 119*  --  125*  --  113*  BUN 57* 74*   < > 117* 102* 105*   < > 13 22* 23    CREATININE 5.78* 6.29*   < > 4.86* 4.17* 3.76*   < > 1.00 0.9 0.88  CALCIUM 7.8* 8.3*   < > 8.6* 8.5* 8.6*  --  9.2  --  9.4  MG 1.8 1.7  --   --   --  1.2*  --   --   --   --   PHOS  --  6.8*   < > 6.8* 5.5* 5.5*  --   --   --   --    < > = values in this interval not displayed.   Recent Labs    04/17/17 1146 05/17/17 0313  05/21/17 0317  05/27/17 0247 05/28/17 0342 05/29/17 0349  AST 21 144*  --  25  --   --   --   --   ALT 14 127*  --  60*  --   --   --   --   ALKPHOS  --  73  --  56  --   --   --   --   BILITOT 0.5 0.6  --  0.6  --   --   --   --   PROT 6.5 5.0*  --  5.1*  --   --   --   --   ALBUMIN  --  1.9*   < > 1.8*   < > 2.2* 2.2* 2.2*   < > = values in this interval not displayed.   Recent Labs    05/12/17 2326  05/13/17 0944  05/26/17 0312 05/28/17 0342 06/26/17 09/26/17 0847  WBC 9.6  --  11.9*   < > 8.0 7.7 5.0 5.4  NEUTROABS 8.1*  --  9.2*  --   --   --  3  --   HGB 16.3*   < > 12.1   < > 9.9* 9.7* 11.7* 14.3  HCT 49.5*   < > 37.5   < > 28.8* 29.9* 35* 44.4  MCV 85.3  --  84.7   < > 83.5 84.7  --  88.3  PLT 214  --  234   < > 301 288 195 153   < > = values in this interval not displayed.    Lab Results  Component Value Date   TSH 2.057 Test methodology is 3rd generation TSH 02/04/2009    Lab Results  Component Value Date   HGBA1C 7.1 (A) 09/21/2017   Lab Results  Component Value Date   CHOL 113 05/13/2017   HDL 52 05/13/2017   LDLCALC 55 05/13/2017   TRIG 28 05/13/2017   CHOLHDL 2.2 05/13/2017    Assessment/Plan  1. Advanced care planning/counseling discussion -Discussed current medications and plan of care with IDT  2. Candidal skin infection -Recently started on miconazole nitrate 2% cream topically under bilateral breasts for a total of 3 weeks  3. Essential hypertension -Well-controlled, continue losartan 25 mg 1 tab daily  4. Type 2 diabetes mellitus with other circulatory complication, with long-term current use of insulin  (HCC) Lab Results  Component Value Date   HGBA1C 7.1 (A) 09/21/2017  -Well-controlled, continue Lantus 100 unit/mL inject 5 units subcutaneously nightly     Family/ staff Communication: Discussed plan of care with IDT.  Labs/tests ordered:  PPD skin test  Goals of care:   Long-term care.   Durenda Age, NP [facility] [phone] (Monday-Friday 8:00 a.m. - 5:00 p.m.) [phone] (after hours)

## 2018-03-05 DIAGNOSIS — M6281 Muscle weakness (generalized): Secondary | ICD-10-CM | POA: Diagnosis not present

## 2018-03-12 ENCOUNTER — Non-Acute Institutional Stay (SKILLED_NURSING_FACILITY): Payer: Medicare Other | Admitting: Adult Health

## 2018-03-12 ENCOUNTER — Encounter: Payer: Self-pay | Admitting: Adult Health

## 2018-03-12 DIAGNOSIS — E1140 Type 2 diabetes mellitus with diabetic neuropathy, unspecified: Secondary | ICD-10-CM | POA: Diagnosis not present

## 2018-03-12 DIAGNOSIS — I2510 Atherosclerotic heart disease of native coronary artery without angina pectoris: Secondary | ICD-10-CM

## 2018-03-12 DIAGNOSIS — Z794 Long term (current) use of insulin: Secondary | ICD-10-CM | POA: Diagnosis not present

## 2018-03-12 DIAGNOSIS — H04123 Dry eye syndrome of bilateral lacrimal glands: Secondary | ICD-10-CM | POA: Diagnosis not present

## 2018-03-12 DIAGNOSIS — Z20828 Contact with and (suspected) exposure to other viral communicable diseases: Secondary | ICD-10-CM | POA: Diagnosis not present

## 2018-03-12 DIAGNOSIS — I10 Essential (primary) hypertension: Secondary | ICD-10-CM

## 2018-03-12 DIAGNOSIS — K219 Gastro-esophageal reflux disease without esophagitis: Secondary | ICD-10-CM | POA: Diagnosis not present

## 2018-03-12 NOTE — Progress Notes (Signed)
Location:  [facility] Room Number: 217-A Place of Service:  SNF (31) Provider:  Durenda Age, NP  Patient Care Team: Hendricks Limes, MD as PCP - General (Internal Medicine) Nahser, Wonda Cheng, MD as PCP - Cardiology (Cardiology) Thressa Sheller, MD (Inactive) as Consulting Physician (Internal Medicine) Fanny Skates, MD as Consulting Physician (General Surgery) Truitt Merle, MD as Consulting Physician (Hematology) Thea Silversmith, MD as Consulting Physician (Radiation Oncology) Mauro Kaufmann, RN as Registered Nurse Rockwell Germany, RN as Registered Nurse Jake Shark, Johny Blamer, NP as Nurse Practitioner (Nurse Practitioner) Nickola Major, NP as Nurse Practitioner (Internal Medicine)  Extended Emergency Contact Information Primary Emergency Contact: [facility] Address: [address] [place]          [place], [place] [address] Johnnette Litter of [place] Phone: [phone] Work Phone: [phone] Relation: Daughter Secondary Emergency Contact: Bartholome Bill Address: [address]          [place], [place] [address] Johnnette Litter of [place] Phone: [phone] Mobile Phone: [phone] Relation: Grandson  Code Status:  Full Code  Goals of care: Advanced Directive information Advanced Directives 11/22/2017  Does Patient Have a Medical Advance Directive? No  Type of Advance Directive -  Does patient want to make changes to medical advance directive? -  Copy of [place] in Chart? -  Would patient like information on creating a medical advance directive? No - Patient declined     Chief Complaint  Patient presents with  . Discharge Note    Patient to discharge 03/13/18 to an ALF    HPI:  Pt is an 85 y.o. [name] seen today for a discharge visit.  She is to discharge to an ALF on 03/13/18 with home health PT and OT.    She has been admitted to [facility] on 05/29/17 after  being hospitalized for NSTEMI. She had left ankle fracture dislocation which failed conservative measures. She had left tib-calc fusion on 09/26/17.  She is now able to put her shoes and able to ambulate short distances using a walker. Latest BIMS score is 15/15. She has a PMH of chronic osteomyelitis of her left foot, hypertension, acute kidney failure, diabetes, and acute on chronic combined diastolic/systolic heart failure.     Past Medical History:  Diagnosis Date  . Anemia    takes Ferrous Sulfate daily  . Arthritis   . Basal cell carcinoma of breast 1998  . Bruises easily   . Dizziness   . DM (diabetes mellitus) ([place])    takes Metformin daily  . Eczema   . Emphysema lung ([place])   . GERD (gastroesophageal reflux disease)    takes Protonix daily  . Glaucoma    Bil  . History of hiatal hernia   . HTN (hypertension)    takes Diovan daily  . Hyperlipidemia   . ITP (idiopathic thrombocytopenic purpura) 2/09  . Myocardial infarction ([place])    N-stemi 2019  . Nocturia   . Osteoporosis   . Peripheral edema   . Stroke ([place]) 02/03/09   mid brain, diplopia   Past Surgical History:  Procedure Laterality Date  . ANKLE FUSION Left 09/26/2017   Procedure: LEFT TIB-CALC FUSION;  Surgeon: Newt Minion, MD;  Location: [facility];  Service: Orthopedics;  Laterality: Left;  . BREAST LUMPECTOMY WITH RADIOACTIVE SEED LOCALIZATION Right 10/23/2014   Procedure: RIGHT BREAST LUMPECTOMY WITH RADIOACTIVE SEED LOCALIZATION;  Surgeon: Fanny Skates, MD;  Location: [facility];  Service:  General;  Laterality: Right;  . CATARACT EXTRACTION Bilateral   . COLONOSCOPY    . ESOPHAGOGASTRODUODENOSCOPY    . LEFT HEART CATH AND CORONARY ANGIOGRAPHY N/A 07/09/2017   Procedure: LEFT HEART CATH AND CORONARY ANGIOGRAPHY;  Surgeon: Jettie Booze, MD;  Location: [facility];  Service: Cardiovascular;  Laterality: N/A;  . MYOMECTOMY N/A 03/14/1995   late 90's per pt  . PARATHYROIDECTOMY  2008  . REFRACTIVE  SURGERY Bilateral   . TONSILLECTOMY  as a child    Allergies  Allergen Reactions  . Travatan Z [Travoprost (Bak Free)] Other (See Comments)    dizziness    Outpatient Encounter Medications as of 03/12/2018  Medication Sig  . acetaminophen (TYLENOL) 500 MG tablet Take 500 mg by mouth every 4 (four) hours as needed for mild pain.  . Amino Acids-Protein Hydrolys (FEEDING SUPPLEMENT, PRO-STAT SUGAR FREE 64,) LIQD Take 30 mLs by mouth daily.  Marland Kitchen aspirin 81 MG chewable tablet Chew 81 mg by mouth daily after breakfast.   . bisacodyl (DULCOLAX) 10 MG suppository Place 10 mg rectally daily as needed for moderate constipation (If not relieved by MOM).   . carvedilol (COREG) 3.125 MG tablet Take 1 tablet (3.125 mg total) by mouth 2 (two) times daily with a meal.  . Cholecalciferol 50 MCG (2000 UT) TABS Take 2,000 Units by mouth daily.  . Emollient (CETAPHIL) cream Apply 1 application topically daily. Wash left leg and apply Cetaphil cream and then reapply ACE wrap.  Apply to right leg for dry skin.  . fexofenadine (ALLEGRA) 180 MG tablet Take 180 mg by mouth daily.  . insulin glargine (LANTUS) 100 UNIT/ML injection Inject 5 Units into the skin at bedtime.   . isosorbide mononitrate (IMDUR) 30 MG 24 hr tablet Take 0.5 tablets (15 mg total) by mouth daily.  Marland Kitchen lactulose (CHRONULAC) 10 GM/15ML solution Take 30 g by mouth daily as needed for mild constipation.   Marland Kitchen losartan (COZAAR) 25 MG tablet Take 25 mg by mouth daily.  . magnesium hydroxide (MILK OF MAGNESIA) 400 MG/5ML suspension Take 30 mLs by mouth daily as needed for mild constipation (If no BM in 3 days).  . magnesium oxide (MAG-OX) 400 (241.3 Mg) MG tablet Take 1 tablet (400 mg total) by mouth 2 (two) times daily.  . miconazole (MICOTIN) 2 % cream Apply 1 application topically 2 (two) times daily.  . Multiple Vitamins-Minerals (MULTIVITAMIN PO) Take 1 tablet by mouth daily.  . nitroGLYCERIN (NITROSTAT) 0.4 MG SL tablet Place 1 tablet (0.4 mg total)  under the tongue every 5 (five) minutes x 3 doses as needed for chest pain.  . Omega-3 Fatty Acids (FISH OIL) 1000 MG CAPS Take 1,000 mg by mouth 2 (two) times daily.   Marland Kitchen oseltamivir (TAMIFLU) 75 MG capsule Take 75 mg by mouth daily.  . pantoprazole (PROTONIX) 40 MG tablet Take 40 mg by mouth daily after breakfast.   . polyethylene glycol (MIRALAX / GLYCOLAX) packet Take 17 g by mouth daily.  . polyvinyl alcohol (LIQUIFILM TEARS) 1.4 % ophthalmic solution Place 1 drop into both eyes 2 (two) times daily.  . sennosides-docusate sodium (SENOKOT-S) 8.6-50 MG tablet Take 2 tablets by mouth 2 (two) times daily.  . Sodium Phosphates (RA SALINE ENEMA) 19-7 GM/118ML ENEM Place 1 each rectally daily as needed (for constipation. If not relieved by Bisacodyl Suppository).   . [DISCONTINUED] hydrOXYzine (ATARAX/VISTARIL) 10 MG tablet Take 10 mg by mouth every 12 (twelve) hours as needed for itching.  No facility-administered encounter medications on file as of 03/12/2018.     Review of Systems  GENERAL: No change in appetite, no fatigue, no weight changes, no fever, chills or weakness MOUTH and THROAT: Denies oral discomfort, gingival pain or bleeding RESPIRATORY: no cough, SOB, DOE, wheezing, hemoptysis CARDIAC: No chest pain, edema or palpitations GI: No abdominal pain, diarrhea, constipation, heart burn, nausea or vomiting GU: Denies dysuria, frequency, hematuria, incontinence, or discharge PSYCHIATRIC: Denies feelings of depression or anxiety. No report of hallucinations, insomnia, paranoia, or agitation   Immunization History  Administered Date(s) Administered  . Influenza Split 01/14/2011  . Influenza Whole 01/27/2010  . Influenza-Unspecified 01/09/2011, 11/06/2016, 02/05/2018  . Pneumococcal Conjugate-13 06/26/2017  . Pneumococcal Polysaccharide-23 07/04/2007  . Tdap 11/23/2017   Pertinent  Health Maintenance Due  Topic Date Due  . FOOT EXAM  09/02/1940  . HEMOGLOBIN A1C  03/24/2018  .  OPHTHALMOLOGY EXAM  11/13/2018  . INFLUENZA VACCINE  Completed  . DEXA SCAN  Completed  . PNA vac Low Risk Adult  Completed   Fall Risk  11/22/2017 09/10/2017 07/17/2017 06/14/2017 05/10/2017  Falls in the past year? No Yes Yes Yes Yes  Number falls in past yr: - 1 1 - 1  Injury with Fall? - Yes Yes Yes No  Comment - - - - "fall in hallway at home"  Risk for fall due to : - History of fall(s) - - Impaired balance/gait    Vitals:   03/12/18 1009  BP: (!) 89/56  Pulse: 66  Resp: 18  Temp: (!) 97.2 F (36.2 C)  TempSrc: Oral  SpO2: 95%  Weight: 167 lb 9.6 oz (76 kg)  Height: 5\' 2"  (1.575 m)   Body mass index is 30.65 kg/m.  Physical Exam  GENERAL APPEARANCE: Well nourished. In no acute distress. Obese SKIN:  Skin is warm and dry.  MOUTH and THROAT: Lips are without lesions. Oral mucosa is moist and without lesions. Tongue is normal in shape, size, and color and without lesions RESPIRATORY: Breathing is even & unlabored, BS CTAB CARDIAC: RRR, no murmur,no extra heart sounds, no edema GI: Abdomen soft, normal BS, no masses, no tenderness EXTREMITIES:  Able to move X 4 extremities NEUROLOGICAL: There is no tremor. Speech is clear. Alert and oriented X 3. PSYCHIATRIC:  Affect and behavior are appropriate   Labs reviewed: Recent Labs    05/17/17 0313 05/18/17 0419  05/27/17 0247 05/28/17 0342 05/29/17 0349  07/09/17 1000 08/13/17 09/26/17 0847  NA 133* 132*   < > 136 132* 135   < > 139 141 142  K 5.2* 5.6*   < > 4.5 3.9 4.0   < > 4.0 4.1 4.0  CL 104 103   < > 103 100* 103  --  106  --  107  CO2 18* 18*   < > 19* 19* 20*  --  26  --  27  GLUCOSE 148* 136*   < > 118* 108* 119*  --  125*  --  113*  BUN 57* 74*   < > 117* 102* 105*   < > 13 22* 23  CREATININE 5.78* 6.29*   < > 4.Chelsea* 4.17* 3.76*   < > 1.00 0.9 0.88  CALCIUM 7.8* 8.3*   < > 8.6* 8.5* 8.6*  --  9.2  --  9.4  MG 1.8 1.7  --   --   --  1.2*  --   --   --   --  PHOS  --  6.8*   < > 6.8* 5.5* 5.5*  --   --   --    --    < > = values in this interval not displayed.   Recent Labs    04/17/17 1146 05/17/17 0313  05/21/17 0317  05/27/17 0247 05/28/17 0342 05/29/17 0349  AST 21 144*  --  25  --   --   --   --   ALT 14 127*  --  60*  --   --   --   --   ALKPHOS  --  73  --  56  --   --   --   --   BILITOT 0.5 0.6  --  0.6  --   --   --   --   PROT 6.5 5.0*  --  5.1*  --   --   --   --   ALBUMIN  --  1.9*   < > 1.8*   < > 2.2* 2.2* 2.2*   < > = values in this interval not displayed.   Recent Labs    05/12/17 2326  05/13/17 0944  05/26/17 0312 05/28/17 0342 06/26/17 09/26/17 0847  WBC 9.6  --  11.9*   < > 8.0 7.7 5.0 5.4  NEUTROABS 8.1*  --  9.2*  --   --   --  3  --   HGB 16.3*   < > 12.1   < > 9.9* 9.7* 11.7* 14.3  HCT 49.5*   < > 37.5   < > 28.8* 29.9* 35* 44.4  MCV 85.3  --  84.7   < > 83.5 84.7  --  88.3  PLT 214  --  234   < > 301 288 195 153   < > = values in this interval not displayed.    Lab Results  Component Value Date   TSH 2.057 Test methodology is 3rd generation TSH 02/04/2009   Lab Results  Component Value Date   HGBA1C 7.1 (A) 09/21/2017   Lab Results  Component Value Date   CHOL 113 05/13/2017   HDL 52 05/13/2017   LDLCALC 55 05/13/2017   TRIG 28 05/13/2017   CHOLHDL 2.2 05/13/2017    Assessment/Plan  1. Type 2 diabetes mellitus with diabetic neuropathy, with long-term current use of insulin (HCC) Lab Results  Component Value Date   HGBA1C 7.1 (A) 09/21/2017  -  Continue Lantus 100 units/mL inject 5 units subcutaneously at at bedtime   2. Essential hypertension -  Continue losartan 25 mg 1 tab daily, carvedilol 3.125 mg 1 tab twice a day  3. Coronary artery disease involving native heart without angina pectoris, unspecified vessel or lesion type -Continue NTG as needed, isosorbide mononitrate ER 30 mg 1/2 tab daily, aspirin 81 mg daily  4. Gastroesophageal reflux disease without esophagitis -Continue pantoprazole 40 mg 1 tab daily  5. Exposure to  the flu -Continue Tamiflu 75 mg daily till 03/15/2018  6. Dry eyes - continue artificial tears 1 drop to both eyes twice a day    I have filled out patient's discharge paperwork and written prescriptions.  Patient will receive home health PT and OT.  DME provided:  Rollator and wheelchair  Total discharge time: Greater than 30 minutes Greater than 50% was spent in counseling and coordination of care.   Discharge time involved coordination of the discharge process with social worker, nursing staff and therapy department. Medical justification for home health  services/DME verified.   Durenda Age, NP [facility] [phone] (Monday-Friday 8:00 a.m. - 5:00 p.m.) [phone] (after hours)

## 2018-03-13 DIAGNOSIS — S82892D Other fracture of left lower leg, subsequent encounter for closed fracture with routine healing: Secondary | ICD-10-CM | POA: Diagnosis not present

## 2018-03-13 DIAGNOSIS — I5043 Acute on chronic combined systolic (congestive) and diastolic (congestive) heart failure: Secondary | ICD-10-CM | POA: Diagnosis not present

## 2018-03-13 DIAGNOSIS — I110 Hypertensive heart disease with heart failure: Secondary | ICD-10-CM | POA: Diagnosis not present

## 2018-03-14 DIAGNOSIS — J439 Emphysema, unspecified: Secondary | ICD-10-CM | POA: Diagnosis not present

## 2018-03-14 DIAGNOSIS — F0151 Vascular dementia with behavioral disturbance: Secondary | ICD-10-CM | POA: Diagnosis not present

## 2018-03-14 DIAGNOSIS — I252 Old myocardial infarction: Secondary | ICD-10-CM | POA: Diagnosis not present

## 2018-03-14 DIAGNOSIS — I872 Venous insufficiency (chronic) (peripheral): Secondary | ICD-10-CM | POA: Diagnosis not present

## 2018-03-14 DIAGNOSIS — E1142 Type 2 diabetes mellitus with diabetic polyneuropathy: Secondary | ICD-10-CM | POA: Diagnosis not present

## 2018-03-14 DIAGNOSIS — Z7982 Long term (current) use of aspirin: Secondary | ICD-10-CM | POA: Diagnosis not present

## 2018-03-14 DIAGNOSIS — Z794 Long term (current) use of insulin: Secondary | ICD-10-CM | POA: Diagnosis not present

## 2018-03-14 DIAGNOSIS — Z9181 History of falling: Secondary | ICD-10-CM | POA: Diagnosis not present

## 2018-03-14 DIAGNOSIS — D649 Anemia, unspecified: Secondary | ICD-10-CM | POA: Diagnosis not present

## 2018-03-14 DIAGNOSIS — R351 Nocturia: Secondary | ICD-10-CM | POA: Diagnosis not present

## 2018-03-14 DIAGNOSIS — I679 Cerebrovascular disease, unspecified: Secondary | ICD-10-CM | POA: Diagnosis not present

## 2018-03-14 DIAGNOSIS — S82892D Other fracture of left lower leg, subsequent encounter for closed fracture with routine healing: Secondary | ICD-10-CM | POA: Diagnosis not present

## 2018-03-14 DIAGNOSIS — Z8673 Personal history of transient ischemic attack (TIA), and cerebral infarction without residual deficits: Secondary | ICD-10-CM | POA: Diagnosis not present

## 2018-03-14 DIAGNOSIS — I5043 Acute on chronic combined systolic (congestive) and diastolic (congestive) heart failure: Secondary | ICD-10-CM | POA: Diagnosis not present

## 2018-03-14 DIAGNOSIS — E785 Hyperlipidemia, unspecified: Secondary | ICD-10-CM | POA: Diagnosis not present

## 2018-03-14 DIAGNOSIS — K219 Gastro-esophageal reflux disease without esophagitis: Secondary | ICD-10-CM | POA: Diagnosis not present

## 2018-03-14 DIAGNOSIS — H409 Unspecified glaucoma: Secondary | ICD-10-CM | POA: Diagnosis not present

## 2018-03-14 DIAGNOSIS — M1990 Unspecified osteoarthritis, unspecified site: Secondary | ICD-10-CM | POA: Diagnosis not present

## 2018-03-14 DIAGNOSIS — Z853 Personal history of malignant neoplasm of breast: Secondary | ICD-10-CM | POA: Diagnosis not present

## 2018-03-14 DIAGNOSIS — I110 Hypertensive heart disease with heart failure: Secondary | ICD-10-CM | POA: Diagnosis not present

## 2018-03-15 DIAGNOSIS — S82892D Other fracture of left lower leg, subsequent encounter for closed fracture with routine healing: Secondary | ICD-10-CM | POA: Diagnosis not present

## 2018-03-15 DIAGNOSIS — D649 Anemia, unspecified: Secondary | ICD-10-CM | POA: Diagnosis not present

## 2018-03-15 DIAGNOSIS — I110 Hypertensive heart disease with heart failure: Secondary | ICD-10-CM | POA: Diagnosis not present

## 2018-03-15 DIAGNOSIS — I872 Venous insufficiency (chronic) (peripheral): Secondary | ICD-10-CM | POA: Diagnosis not present

## 2018-03-15 DIAGNOSIS — J439 Emphysema, unspecified: Secondary | ICD-10-CM | POA: Diagnosis not present

## 2018-03-15 DIAGNOSIS — I5043 Acute on chronic combined systolic (congestive) and diastolic (congestive) heart failure: Secondary | ICD-10-CM | POA: Diagnosis not present

## 2018-03-19 DIAGNOSIS — S82892D Other fracture of left lower leg, subsequent encounter for closed fracture with routine healing: Secondary | ICD-10-CM | POA: Diagnosis not present

## 2018-03-19 DIAGNOSIS — E119 Type 2 diabetes mellitus without complications: Secondary | ICD-10-CM | POA: Diagnosis not present

## 2018-03-19 DIAGNOSIS — I110 Hypertensive heart disease with heart failure: Secondary | ICD-10-CM | POA: Diagnosis not present

## 2018-03-19 DIAGNOSIS — Z794 Long term (current) use of insulin: Secondary | ICD-10-CM | POA: Diagnosis not present

## 2018-03-19 DIAGNOSIS — I5043 Acute on chronic combined systolic (congestive) and diastolic (congestive) heart failure: Secondary | ICD-10-CM | POA: Diagnosis not present

## 2018-03-19 DIAGNOSIS — R600 Localized edema: Secondary | ICD-10-CM | POA: Diagnosis not present

## 2018-03-19 DIAGNOSIS — D649 Anemia, unspecified: Secondary | ICD-10-CM | POA: Diagnosis not present

## 2018-03-19 DIAGNOSIS — I10 Essential (primary) hypertension: Secondary | ICD-10-CM | POA: Diagnosis not present

## 2018-03-19 DIAGNOSIS — J439 Emphysema, unspecified: Secondary | ICD-10-CM | POA: Diagnosis not present

## 2018-03-19 DIAGNOSIS — I872 Venous insufficiency (chronic) (peripheral): Secondary | ICD-10-CM | POA: Diagnosis not present

## 2018-03-19 DIAGNOSIS — L299 Pruritus, unspecified: Secondary | ICD-10-CM | POA: Diagnosis not present

## 2018-03-19 DIAGNOSIS — I5042 Chronic combined systolic (congestive) and diastolic (congestive) heart failure: Secondary | ICD-10-CM | POA: Diagnosis not present

## 2018-03-21 DIAGNOSIS — I5043 Acute on chronic combined systolic (congestive) and diastolic (congestive) heart failure: Secondary | ICD-10-CM | POA: Diagnosis not present

## 2018-03-21 DIAGNOSIS — D649 Anemia, unspecified: Secondary | ICD-10-CM | POA: Diagnosis not present

## 2018-03-21 DIAGNOSIS — S82892D Other fracture of left lower leg, subsequent encounter for closed fracture with routine healing: Secondary | ICD-10-CM | POA: Diagnosis not present

## 2018-03-21 DIAGNOSIS — I110 Hypertensive heart disease with heart failure: Secondary | ICD-10-CM | POA: Diagnosis not present

## 2018-03-21 DIAGNOSIS — J439 Emphysema, unspecified: Secondary | ICD-10-CM | POA: Diagnosis not present

## 2018-03-21 DIAGNOSIS — I872 Venous insufficiency (chronic) (peripheral): Secondary | ICD-10-CM | POA: Diagnosis not present

## 2018-03-22 DIAGNOSIS — D1801 Hemangioma of skin and subcutaneous tissue: Secondary | ICD-10-CM | POA: Diagnosis not present

## 2018-03-22 DIAGNOSIS — D225 Melanocytic nevi of trunk: Secondary | ICD-10-CM | POA: Diagnosis not present

## 2018-03-22 DIAGNOSIS — Z79899 Other long term (current) drug therapy: Secondary | ICD-10-CM | POA: Diagnosis not present

## 2018-03-22 DIAGNOSIS — L720 Epidermal cyst: Secondary | ICD-10-CM | POA: Diagnosis not present

## 2018-03-22 DIAGNOSIS — L304 Erythema intertrigo: Secondary | ICD-10-CM | POA: Diagnosis not present

## 2018-03-22 DIAGNOSIS — Z85828 Personal history of other malignant neoplasm of skin: Secondary | ICD-10-CM | POA: Diagnosis not present

## 2018-03-22 DIAGNOSIS — L821 Other seborrheic keratosis: Secondary | ICD-10-CM | POA: Diagnosis not present

## 2018-03-22 DIAGNOSIS — D2271 Melanocytic nevi of right lower limb, including hip: Secondary | ICD-10-CM | POA: Diagnosis not present

## 2018-03-26 DIAGNOSIS — D649 Anemia, unspecified: Secondary | ICD-10-CM | POA: Diagnosis not present

## 2018-03-26 DIAGNOSIS — I5043 Acute on chronic combined systolic (congestive) and diastolic (congestive) heart failure: Secondary | ICD-10-CM | POA: Diagnosis not present

## 2018-03-26 DIAGNOSIS — J439 Emphysema, unspecified: Secondary | ICD-10-CM | POA: Diagnosis not present

## 2018-03-26 DIAGNOSIS — I872 Venous insufficiency (chronic) (peripheral): Secondary | ICD-10-CM | POA: Diagnosis not present

## 2018-03-26 DIAGNOSIS — S82892D Other fracture of left lower leg, subsequent encounter for closed fracture with routine healing: Secondary | ICD-10-CM | POA: Diagnosis not present

## 2018-03-26 DIAGNOSIS — I110 Hypertensive heart disease with heart failure: Secondary | ICD-10-CM | POA: Diagnosis not present

## 2018-03-27 DIAGNOSIS — D649 Anemia, unspecified: Secondary | ICD-10-CM | POA: Diagnosis not present

## 2018-03-27 DIAGNOSIS — J439 Emphysema, unspecified: Secondary | ICD-10-CM | POA: Diagnosis not present

## 2018-03-27 DIAGNOSIS — I5043 Acute on chronic combined systolic (congestive) and diastolic (congestive) heart failure: Secondary | ICD-10-CM | POA: Diagnosis not present

## 2018-03-27 DIAGNOSIS — I872 Venous insufficiency (chronic) (peripheral): Secondary | ICD-10-CM | POA: Diagnosis not present

## 2018-03-27 DIAGNOSIS — S82892D Other fracture of left lower leg, subsequent encounter for closed fracture with routine healing: Secondary | ICD-10-CM | POA: Diagnosis not present

## 2018-03-27 DIAGNOSIS — I110 Hypertensive heart disease with heart failure: Secondary | ICD-10-CM | POA: Diagnosis not present

## 2018-03-28 DIAGNOSIS — I5043 Acute on chronic combined systolic (congestive) and diastolic (congestive) heart failure: Secondary | ICD-10-CM | POA: Diagnosis not present

## 2018-03-28 DIAGNOSIS — J439 Emphysema, unspecified: Secondary | ICD-10-CM | POA: Diagnosis not present

## 2018-03-28 DIAGNOSIS — I110 Hypertensive heart disease with heart failure: Secondary | ICD-10-CM | POA: Diagnosis not present

## 2018-03-28 DIAGNOSIS — I872 Venous insufficiency (chronic) (peripheral): Secondary | ICD-10-CM | POA: Diagnosis not present

## 2018-03-28 DIAGNOSIS — S82892D Other fracture of left lower leg, subsequent encounter for closed fracture with routine healing: Secondary | ICD-10-CM | POA: Diagnosis not present

## 2018-03-28 DIAGNOSIS — D649 Anemia, unspecified: Secondary | ICD-10-CM | POA: Diagnosis not present

## 2018-03-29 DIAGNOSIS — D649 Anemia, unspecified: Secondary | ICD-10-CM | POA: Diagnosis not present

## 2018-03-29 DIAGNOSIS — J439 Emphysema, unspecified: Secondary | ICD-10-CM | POA: Diagnosis not present

## 2018-03-29 DIAGNOSIS — I110 Hypertensive heart disease with heart failure: Secondary | ICD-10-CM | POA: Diagnosis not present

## 2018-03-29 DIAGNOSIS — I872 Venous insufficiency (chronic) (peripheral): Secondary | ICD-10-CM | POA: Diagnosis not present

## 2018-03-29 DIAGNOSIS — S82892D Other fracture of left lower leg, subsequent encounter for closed fracture with routine healing: Secondary | ICD-10-CM | POA: Diagnosis not present

## 2018-03-29 DIAGNOSIS — I5043 Acute on chronic combined systolic (congestive) and diastolic (congestive) heart failure: Secondary | ICD-10-CM | POA: Diagnosis not present

## 2018-04-02 DIAGNOSIS — E119 Type 2 diabetes mellitus without complications: Secondary | ICD-10-CM | POA: Diagnosis not present

## 2018-04-02 DIAGNOSIS — I872 Venous insufficiency (chronic) (peripheral): Secondary | ICD-10-CM | POA: Diagnosis not present

## 2018-04-02 DIAGNOSIS — I5042 Chronic combined systolic (congestive) and diastolic (congestive) heart failure: Secondary | ICD-10-CM | POA: Diagnosis not present

## 2018-04-02 DIAGNOSIS — J439 Emphysema, unspecified: Secondary | ICD-10-CM | POA: Diagnosis not present

## 2018-04-02 DIAGNOSIS — S82892D Other fracture of left lower leg, subsequent encounter for closed fracture with routine healing: Secondary | ICD-10-CM | POA: Diagnosis not present

## 2018-04-02 DIAGNOSIS — D649 Anemia, unspecified: Secondary | ICD-10-CM | POA: Diagnosis not present

## 2018-04-02 DIAGNOSIS — I10 Essential (primary) hypertension: Secondary | ICD-10-CM | POA: Diagnosis not present

## 2018-04-02 DIAGNOSIS — R600 Localized edema: Secondary | ICD-10-CM | POA: Diagnosis not present

## 2018-04-02 DIAGNOSIS — I5043 Acute on chronic combined systolic (congestive) and diastolic (congestive) heart failure: Secondary | ICD-10-CM | POA: Diagnosis not present

## 2018-04-02 DIAGNOSIS — I110 Hypertensive heart disease with heart failure: Secondary | ICD-10-CM | POA: Diagnosis not present

## 2018-04-02 DIAGNOSIS — Z794 Long term (current) use of insulin: Secondary | ICD-10-CM | POA: Diagnosis not present

## 2018-04-03 DIAGNOSIS — D631 Anemia in chronic kidney disease: Secondary | ICD-10-CM | POA: Diagnosis not present

## 2018-04-03 DIAGNOSIS — E1122 Type 2 diabetes mellitus with diabetic chronic kidney disease: Secondary | ICD-10-CM | POA: Diagnosis not present

## 2018-04-03 DIAGNOSIS — M8660 Other chronic osteomyelitis, unspecified site: Secondary | ICD-10-CM | POA: Diagnosis not present

## 2018-04-03 DIAGNOSIS — N179 Acute kidney failure, unspecified: Secondary | ICD-10-CM | POA: Diagnosis not present

## 2018-04-03 DIAGNOSIS — I5040 Unspecified combined systolic (congestive) and diastolic (congestive) heart failure: Secondary | ICD-10-CM | POA: Diagnosis not present

## 2018-04-03 DIAGNOSIS — I129 Hypertensive chronic kidney disease with stage 1 through stage 4 chronic kidney disease, or unspecified chronic kidney disease: Secondary | ICD-10-CM | POA: Diagnosis not present

## 2018-04-03 DIAGNOSIS — N189 Chronic kidney disease, unspecified: Secondary | ICD-10-CM | POA: Diagnosis not present

## 2018-04-04 DIAGNOSIS — I110 Hypertensive heart disease with heart failure: Secondary | ICD-10-CM | POA: Diagnosis not present

## 2018-04-04 DIAGNOSIS — D649 Anemia, unspecified: Secondary | ICD-10-CM | POA: Diagnosis not present

## 2018-04-04 DIAGNOSIS — I5043 Acute on chronic combined systolic (congestive) and diastolic (congestive) heart failure: Secondary | ICD-10-CM | POA: Diagnosis not present

## 2018-04-04 DIAGNOSIS — J439 Emphysema, unspecified: Secondary | ICD-10-CM | POA: Diagnosis not present

## 2018-04-04 DIAGNOSIS — I872 Venous insufficiency (chronic) (peripheral): Secondary | ICD-10-CM | POA: Diagnosis not present

## 2018-04-04 DIAGNOSIS — S82892D Other fracture of left lower leg, subsequent encounter for closed fracture with routine healing: Secondary | ICD-10-CM | POA: Diagnosis not present

## 2018-04-05 DIAGNOSIS — I110 Hypertensive heart disease with heart failure: Secondary | ICD-10-CM | POA: Diagnosis not present

## 2018-04-05 DIAGNOSIS — D649 Anemia, unspecified: Secondary | ICD-10-CM | POA: Diagnosis not present

## 2018-04-05 DIAGNOSIS — S82892D Other fracture of left lower leg, subsequent encounter for closed fracture with routine healing: Secondary | ICD-10-CM | POA: Diagnosis not present

## 2018-04-05 DIAGNOSIS — J439 Emphysema, unspecified: Secondary | ICD-10-CM | POA: Diagnosis not present

## 2018-04-05 DIAGNOSIS — I5043 Acute on chronic combined systolic (congestive) and diastolic (congestive) heart failure: Secondary | ICD-10-CM | POA: Diagnosis not present

## 2018-04-05 DIAGNOSIS — I872 Venous insufficiency (chronic) (peripheral): Secondary | ICD-10-CM | POA: Diagnosis not present

## 2018-04-09 ENCOUNTER — Encounter (INDEPENDENT_AMBULATORY_CARE_PROVIDER_SITE_OTHER): Payer: Self-pay | Admitting: Orthopedic Surgery

## 2018-04-09 ENCOUNTER — Ambulatory Visit (INDEPENDENT_AMBULATORY_CARE_PROVIDER_SITE_OTHER): Payer: Medicare Other | Admitting: Physician Assistant

## 2018-04-09 ENCOUNTER — Ambulatory Visit (INDEPENDENT_AMBULATORY_CARE_PROVIDER_SITE_OTHER): Payer: Medicare Other

## 2018-04-09 VITALS — Ht 62.0 in | Wt 167.6 lb

## 2018-04-09 DIAGNOSIS — E1142 Type 2 diabetes mellitus with diabetic polyneuropathy: Secondary | ICD-10-CM

## 2018-04-09 DIAGNOSIS — E119 Type 2 diabetes mellitus without complications: Secondary | ICD-10-CM | POA: Diagnosis not present

## 2018-04-09 DIAGNOSIS — R600 Localized edema: Secondary | ICD-10-CM | POA: Diagnosis not present

## 2018-04-09 DIAGNOSIS — Z981 Arthrodesis status: Secondary | ICD-10-CM | POA: Diagnosis not present

## 2018-04-09 DIAGNOSIS — I2510 Atherosclerotic heart disease of native coronary artery without angina pectoris: Secondary | ICD-10-CM

## 2018-04-09 DIAGNOSIS — Z794 Long term (current) use of insulin: Secondary | ICD-10-CM | POA: Diagnosis not present

## 2018-04-09 DIAGNOSIS — I110 Hypertensive heart disease with heart failure: Secondary | ICD-10-CM | POA: Diagnosis not present

## 2018-04-09 DIAGNOSIS — I5042 Chronic combined systolic (congestive) and diastolic (congestive) heart failure: Secondary | ICD-10-CM | POA: Diagnosis not present

## 2018-04-09 NOTE — Progress Notes (Signed)
Office Visit Note   Patient: Chelsea [name]           Date of Birth: Jan 26, 1929           MRN: [id number] Visit Date: 04/09/2018              Requested by: Hendricks Limes, MD [address] [place], [place] [address] PCP: Hendricks Limes, MD  Chief Complaint  Patient presents with  . Left Foot - Edema, Pain      HPI: The patient is a 85 year old woman who is here with her daughter for follow-up of her left ankle fusion back in August of last year.  She had a history of a chronic fracture dislocation of her ankle following a cardiac event and underwent delayed fixation.  She reports that she is moved from [facility] to [facility] facility in early February.  She reports she has been working with physical therapy and walking with a walker but reports she has not been able to put much weight over her foot.  She does report edema over her ankle for several weeks now and feels like there is some redness.  She reports some symptoms sharp stabbing pains.  She just started some Lasix for peripheral edema.  We have discussed her wearing compression on both lower extremities each visit but she reports that she just cannot tolerate it.  She does have silver compression socks.  Assessment & Plan: Visit Diagnoses:  1. S/P ankle fusion   2. Diabetic polyneuropathy associated with type 2 diabetes mellitus ([place])     Plan: We placed some 2 XL silver compression socks on the patient today and she was able to tolerate these.  We recommended she elevate as much as possible and wear the compression stockings around the clock except for showering.  Orders were sent to her facility, [facility] concerning wearing the socks around the clock except for showering.  Counseled the patient that she does show good interval healing of the ankle.  She will follow-up in 1 week to see how she is doing with the compression.  Follow-Up Instructions: Return in about 1 week (around 04/16/2018).   Ortho Exam  Patient  is alert, oriented, no adenopathy, well-dressed, normal affect, normal respiratory effort. Bilateral lower extremities have pitting edema left side slightly greater than right.  She has good palpable pedal pulses bilaterally.  She has chronic venous stasis changes with hemosiderin staining.  There is no evidence for infection or cellulitis.  The left calf circumference is 46 cm today.  Imaging: No results found. No images are attached to the encounter.  Labs: Lab Results  Component Value Date   HGBA1C 7.1 (A) 09/21/2017   HGBA1C 6.8 (H) 04/17/2017   HGBA1C 7.2 (H) 10/20/2014   ESRSEDRATE 9 04/17/2017   ESRSEDRATE 11 02/04/2009   CRP 3.4 04/17/2017   CRP 0.8 (H) 02/04/2009   REPTSTATUS 05/18/2017 FINAL 05/12/2017   GRAMSTAIN  03/08/2017    NO WBC SEEN FEW GRAM NEGATIVE RODS FEW GRAM POSITIVE COCCI IN PAIRS RARE GRAM POSITIVE RODS Performed at [facility], [place] [address] [place], [place] [address]    CULT  05/12/2017    NO GROWTH 5 DAYS Performed at [facility] [address] [place], [place] [address]    [place] 03/08/2017   LABORGA ENTEROCOCCUS FAECALIS 03/08/2017     Lab Results  Component Value Date   ALBUMIN 2.2 (L) 05/29/2017   ALBUMIN 2.2 (  L) 05/28/2017   ALBUMIN 2.2 (L) 05/27/2017    Body mass index is 30.65 kg/m.  Orders:  Orders Placed This Encounter  Procedures  . XR Ankle Complete Left   No orders of the defined types were placed in this encounter.    Procedures: No procedures performed  Clinical Data: No additional findings.  ROS:  All other systems negative, except as noted in the HPI. Review of Systems  Objective: Vital Signs: Ht 5\' 2"  (1.575 m)   Wt 167 lb 9.6 oz (76 kg)   BMI 30.65 kg/m   Specialty Comments:  No specialty comments available.  PMFS History: Patient Active Problem List   Diagnosis Date Noted  . Closed fracture dislocation of left ankle joint   . Pressure injury of skin  05/16/2017  . Acute kidney failure ([place])   . Acute on chronic combined systolic and diastolic CHF (congestive heart failure) ([place])   . NSTEMI (non-ST elevated myocardial infarction) ([place]) 05/13/2017  . HTN (hypertension) 05/13/2017  . Diabetes mellitus ([place]) 04/10/2017  . Venous stasis dermatitis of both lower extremities 04/10/2017  . Neuropathy 04/10/2017  . Chronic osteomyelitis of left foot ([place]) 04/10/2017  . Breast cancer of upper-inner quadrant of right female breast ([place]) 10/05/2014  . Occult blood positive stool 03/24/2014  . History of ITP 04/14/2011   Past Medical History:  Diagnosis Date  . Anemia    takes Ferrous Sulfate daily  . Arthritis   . Basal cell carcinoma of breast 1998  . Bruises easily   . Dizziness   . DM (diabetes mellitus) ([place])    takes Metformin daily  . Eczema   . Emphysema lung ([place])   . GERD (gastroesophageal reflux disease)    takes Protonix daily  . Glaucoma    Bil  . History of hiatal hernia   . HTN (hypertension)    takes Diovan daily  . Hyperlipidemia   . ITP (idiopathic thrombocytopenic purpura) 2/09  . Myocardial infarction ([place])    N-stemi 2019  . Nocturia   . Osteoporosis   . Peripheral edema   . Stroke ([place]) 02/03/09   mid brain, diplopia    Family History  Problem Relation Age of Onset  . Breast cancer Mother 39  . Colon cancer Father   . Diabetes Father        questionable  . Multiple sclerosis Daughter   . Heart disease Maternal Grandfather     Past Surgical History:  Procedure Laterality Date  . ANKLE FUSION Left 09/26/2017   Procedure: LEFT TIB-CALC FUSION;  Surgeon: Newt Minion, MD;  Location: [facility];  Service: Orthopedics;  Laterality: Left;  . BREAST LUMPECTOMY WITH RADIOACTIVE SEED LOCALIZATION Right 10/23/2014   Procedure: RIGHT BREAST LUMPECTOMY WITH RADIOACTIVE SEED LOCALIZATION;  Surgeon: Fanny Skates, MD;  Location: [facility];  Service: General;  Laterality: Right;  . CATARACT EXTRACTION Bilateral   .  COLONOSCOPY    . ESOPHAGOGASTRODUODENOSCOPY    . LEFT HEART CATH AND CORONARY ANGIOGRAPHY N/A 07/09/2017   Procedure: LEFT HEART CATH AND CORONARY ANGIOGRAPHY;  Surgeon: Jettie Booze, MD;  Location: [facility];  Service: Cardiovascular;  Laterality: N/A;  . MYOMECTOMY N/A 03/14/1995   late 90's per pt  . PARATHYROIDECTOMY  2008  . REFRACTIVE SURGERY Bilateral   . TONSILLECTOMY  as a child   Social History   Occupational History  . Not on file  Tobacco Use  . Smoking status: Never Smoker  . Smokeless tobacco: Never Used  Substance and Sexual Activity  . Alcohol use: No    Alcohol/week: 0.0 standard drinks  . Drug use: No  . Sexual activity: Not on file

## 2018-04-11 DIAGNOSIS — I872 Venous insufficiency (chronic) (peripheral): Secondary | ICD-10-CM | POA: Diagnosis not present

## 2018-04-11 DIAGNOSIS — I110 Hypertensive heart disease with heart failure: Secondary | ICD-10-CM | POA: Diagnosis not present

## 2018-04-11 DIAGNOSIS — I5043 Acute on chronic combined systolic (congestive) and diastolic (congestive) heart failure: Secondary | ICD-10-CM | POA: Diagnosis not present

## 2018-04-11 DIAGNOSIS — S82892D Other fracture of left lower leg, subsequent encounter for closed fracture with routine healing: Secondary | ICD-10-CM | POA: Diagnosis not present

## 2018-04-11 DIAGNOSIS — D649 Anemia, unspecified: Secondary | ICD-10-CM | POA: Diagnosis not present

## 2018-04-11 DIAGNOSIS — J439 Emphysema, unspecified: Secondary | ICD-10-CM | POA: Diagnosis not present

## 2018-04-12 ENCOUNTER — Encounter (INDEPENDENT_AMBULATORY_CARE_PROVIDER_SITE_OTHER): Payer: Self-pay | Admitting: Physician Assistant

## 2018-04-12 DIAGNOSIS — I872 Venous insufficiency (chronic) (peripheral): Secondary | ICD-10-CM | POA: Diagnosis not present

## 2018-04-12 DIAGNOSIS — I5043 Acute on chronic combined systolic (congestive) and diastolic (congestive) heart failure: Secondary | ICD-10-CM | POA: Diagnosis not present

## 2018-04-12 DIAGNOSIS — J439 Emphysema, unspecified: Secondary | ICD-10-CM | POA: Diagnosis not present

## 2018-04-12 DIAGNOSIS — D649 Anemia, unspecified: Secondary | ICD-10-CM | POA: Diagnosis not present

## 2018-04-12 DIAGNOSIS — I110 Hypertensive heart disease with heart failure: Secondary | ICD-10-CM | POA: Diagnosis not present

## 2018-04-12 DIAGNOSIS — S82892D Other fracture of left lower leg, subsequent encounter for closed fracture with routine healing: Secondary | ICD-10-CM | POA: Diagnosis not present

## 2018-04-13 DIAGNOSIS — I252 Old myocardial infarction: Secondary | ICD-10-CM | POA: Diagnosis not present

## 2018-04-13 DIAGNOSIS — H409 Unspecified glaucoma: Secondary | ICD-10-CM | POA: Diagnosis not present

## 2018-04-13 DIAGNOSIS — Z7982 Long term (current) use of aspirin: Secondary | ICD-10-CM | POA: Diagnosis not present

## 2018-04-13 DIAGNOSIS — S82892D Other fracture of left lower leg, subsequent encounter for closed fracture with routine healing: Secondary | ICD-10-CM | POA: Diagnosis not present

## 2018-04-13 DIAGNOSIS — I5043 Acute on chronic combined systolic (congestive) and diastolic (congestive) heart failure: Secondary | ICD-10-CM | POA: Diagnosis not present

## 2018-04-13 DIAGNOSIS — D649 Anemia, unspecified: Secondary | ICD-10-CM | POA: Diagnosis not present

## 2018-04-13 DIAGNOSIS — Z794 Long term (current) use of insulin: Secondary | ICD-10-CM | POA: Diagnosis not present

## 2018-04-13 DIAGNOSIS — J439 Emphysema, unspecified: Secondary | ICD-10-CM | POA: Diagnosis not present

## 2018-04-13 DIAGNOSIS — Z8673 Personal history of transient ischemic attack (TIA), and cerebral infarction without residual deficits: Secondary | ICD-10-CM | POA: Diagnosis not present

## 2018-04-13 DIAGNOSIS — M1990 Unspecified osteoarthritis, unspecified site: Secondary | ICD-10-CM | POA: Diagnosis not present

## 2018-04-13 DIAGNOSIS — E785 Hyperlipidemia, unspecified: Secondary | ICD-10-CM | POA: Diagnosis not present

## 2018-04-13 DIAGNOSIS — K219 Gastro-esophageal reflux disease without esophagitis: Secondary | ICD-10-CM | POA: Diagnosis not present

## 2018-04-13 DIAGNOSIS — I110 Hypertensive heart disease with heart failure: Secondary | ICD-10-CM | POA: Diagnosis not present

## 2018-04-13 DIAGNOSIS — R351 Nocturia: Secondary | ICD-10-CM | POA: Diagnosis not present

## 2018-04-13 DIAGNOSIS — E1142 Type 2 diabetes mellitus with diabetic polyneuropathy: Secondary | ICD-10-CM | POA: Diagnosis not present

## 2018-04-13 DIAGNOSIS — Z9181 History of falling: Secondary | ICD-10-CM | POA: Diagnosis not present

## 2018-04-13 DIAGNOSIS — F0151 Vascular dementia with behavioral disturbance: Secondary | ICD-10-CM | POA: Diagnosis not present

## 2018-04-13 DIAGNOSIS — Z853 Personal history of malignant neoplasm of breast: Secondary | ICD-10-CM | POA: Diagnosis not present

## 2018-04-13 DIAGNOSIS — I679 Cerebrovascular disease, unspecified: Secondary | ICD-10-CM | POA: Diagnosis not present

## 2018-04-13 DIAGNOSIS — I872 Venous insufficiency (chronic) (peripheral): Secondary | ICD-10-CM | POA: Diagnosis not present

## 2018-04-15 DIAGNOSIS — I110 Hypertensive heart disease with heart failure: Secondary | ICD-10-CM | POA: Diagnosis not present

## 2018-04-15 DIAGNOSIS — S82892D Other fracture of left lower leg, subsequent encounter for closed fracture with routine healing: Secondary | ICD-10-CM | POA: Diagnosis not present

## 2018-04-15 DIAGNOSIS — I5043 Acute on chronic combined systolic (congestive) and diastolic (congestive) heart failure: Secondary | ICD-10-CM | POA: Diagnosis not present

## 2018-04-15 DIAGNOSIS — J439 Emphysema, unspecified: Secondary | ICD-10-CM | POA: Diagnosis not present

## 2018-04-15 DIAGNOSIS — D649 Anemia, unspecified: Secondary | ICD-10-CM | POA: Diagnosis not present

## 2018-04-15 DIAGNOSIS — I872 Venous insufficiency (chronic) (peripheral): Secondary | ICD-10-CM | POA: Diagnosis not present

## 2018-04-16 DIAGNOSIS — E119 Type 2 diabetes mellitus without complications: Secondary | ICD-10-CM | POA: Diagnosis not present

## 2018-04-16 DIAGNOSIS — I110 Hypertensive heart disease with heart failure: Secondary | ICD-10-CM | POA: Diagnosis not present

## 2018-04-16 DIAGNOSIS — Z794 Long term (current) use of insulin: Secondary | ICD-10-CM | POA: Diagnosis not present

## 2018-04-16 DIAGNOSIS — I5042 Chronic combined systolic (congestive) and diastolic (congestive) heart failure: Secondary | ICD-10-CM | POA: Diagnosis not present

## 2018-04-16 DIAGNOSIS — R600 Localized edema: Secondary | ICD-10-CM | POA: Diagnosis not present

## 2018-04-18 ENCOUNTER — Telehealth (INDEPENDENT_AMBULATORY_CARE_PROVIDER_SITE_OTHER): Payer: Self-pay | Admitting: Orthopedic Surgery

## 2018-04-18 DIAGNOSIS — I5043 Acute on chronic combined systolic (congestive) and diastolic (congestive) heart failure: Secondary | ICD-10-CM | POA: Diagnosis not present

## 2018-04-18 DIAGNOSIS — I872 Venous insufficiency (chronic) (peripheral): Secondary | ICD-10-CM | POA: Diagnosis not present

## 2018-04-18 DIAGNOSIS — D649 Anemia, unspecified: Secondary | ICD-10-CM | POA: Diagnosis not present

## 2018-04-18 DIAGNOSIS — S82892D Other fracture of left lower leg, subsequent encounter for closed fracture with routine healing: Secondary | ICD-10-CM | POA: Diagnosis not present

## 2018-04-18 DIAGNOSIS — J439 Emphysema, unspecified: Secondary | ICD-10-CM | POA: Diagnosis not present

## 2018-04-18 DIAGNOSIS — I110 Hypertensive heart disease with heart failure: Secondary | ICD-10-CM | POA: Diagnosis not present

## 2018-04-18 NOTE — Telephone Encounter (Signed)
Can you please call pt. She called with an update today but was supposed to follow up this week in office. I do not see where she made this appt. Can you please call and make an appt with Shawn for either tomorrow or Tuesday of next week.

## 2018-04-18 NOTE — Telephone Encounter (Signed)
Pt called I said she wanted to give Dr.Duda an update on her ankle. Pt said her ankle is looking a lot better, has been wearing the compression socks she was given, has lost about 4Ibs, but that she still has some pain on her ankle area.  [phone]

## 2018-04-18 NOTE — Telephone Encounter (Signed)
Called pt about apt next week the 19th at 1:15 with Sharol Given

## 2018-04-20 DIAGNOSIS — J439 Emphysema, unspecified: Secondary | ICD-10-CM | POA: Diagnosis not present

## 2018-04-20 DIAGNOSIS — I5043 Acute on chronic combined systolic (congestive) and diastolic (congestive) heart failure: Secondary | ICD-10-CM | POA: Diagnosis not present

## 2018-04-20 DIAGNOSIS — D649 Anemia, unspecified: Secondary | ICD-10-CM | POA: Diagnosis not present

## 2018-04-20 DIAGNOSIS — I110 Hypertensive heart disease with heart failure: Secondary | ICD-10-CM | POA: Diagnosis not present

## 2018-04-20 DIAGNOSIS — I872 Venous insufficiency (chronic) (peripheral): Secondary | ICD-10-CM | POA: Diagnosis not present

## 2018-04-20 DIAGNOSIS — S82892D Other fracture of left lower leg, subsequent encounter for closed fracture with routine healing: Secondary | ICD-10-CM | POA: Diagnosis not present

## 2018-04-23 DIAGNOSIS — S82892D Other fracture of left lower leg, subsequent encounter for closed fracture with routine healing: Secondary | ICD-10-CM | POA: Diagnosis not present

## 2018-04-23 DIAGNOSIS — I5042 Chronic combined systolic (congestive) and diastolic (congestive) heart failure: Secondary | ICD-10-CM | POA: Diagnosis not present

## 2018-04-23 DIAGNOSIS — E119 Type 2 diabetes mellitus without complications: Secondary | ICD-10-CM | POA: Diagnosis not present

## 2018-04-23 DIAGNOSIS — R600 Localized edema: Secondary | ICD-10-CM | POA: Diagnosis not present

## 2018-04-23 DIAGNOSIS — I872 Venous insufficiency (chronic) (peripheral): Secondary | ICD-10-CM | POA: Diagnosis not present

## 2018-04-23 DIAGNOSIS — D649 Anemia, unspecified: Secondary | ICD-10-CM | POA: Diagnosis not present

## 2018-04-23 DIAGNOSIS — J439 Emphysema, unspecified: Secondary | ICD-10-CM | POA: Diagnosis not present

## 2018-04-23 DIAGNOSIS — Z79899 Other long term (current) drug therapy: Secondary | ICD-10-CM | POA: Diagnosis not present

## 2018-04-23 DIAGNOSIS — I5043 Acute on chronic combined systolic (congestive) and diastolic (congestive) heart failure: Secondary | ICD-10-CM | POA: Diagnosis not present

## 2018-04-23 DIAGNOSIS — I110 Hypertensive heart disease with heart failure: Secondary | ICD-10-CM | POA: Diagnosis not present

## 2018-04-23 DIAGNOSIS — Z794 Long term (current) use of insulin: Secondary | ICD-10-CM | POA: Diagnosis not present

## 2018-04-25 ENCOUNTER — Ambulatory Visit (INDEPENDENT_AMBULATORY_CARE_PROVIDER_SITE_OTHER): Payer: Medicare Other | Admitting: Orthopedic Surgery

## 2018-04-25 ENCOUNTER — Other Ambulatory Visit: Payer: Self-pay

## 2018-04-25 ENCOUNTER — Encounter (INDEPENDENT_AMBULATORY_CARE_PROVIDER_SITE_OTHER): Payer: Self-pay | Admitting: Orthopedic Surgery

## 2018-04-25 VITALS — Ht 62.0 in | Wt 167.0 lb

## 2018-04-25 DIAGNOSIS — I2510 Atherosclerotic heart disease of native coronary artery without angina pectoris: Secondary | ICD-10-CM | POA: Diagnosis not present

## 2018-04-25 DIAGNOSIS — E43 Unspecified severe protein-calorie malnutrition: Secondary | ICD-10-CM | POA: Diagnosis not present

## 2018-04-25 DIAGNOSIS — I87323 Chronic venous hypertension (idiopathic) with inflammation of bilateral lower extremity: Secondary | ICD-10-CM

## 2018-04-25 DIAGNOSIS — E1142 Type 2 diabetes mellitus with diabetic polyneuropathy: Secondary | ICD-10-CM | POA: Diagnosis not present

## 2018-04-25 DIAGNOSIS — Z981 Arthrodesis status: Secondary | ICD-10-CM | POA: Diagnosis not present

## 2018-04-25 NOTE — Progress Notes (Signed)
Office Visit Note   Patient: Chelsea [name]           Date of Birth: [birth year]           MRN: [id number] Visit Date: 04/25/2018              Requested by: Hendricks Limes, [address] [place], [place] [address] PCP: Hendricks Limes, MD  Chief Complaint  Patient presents with  . Left Ankle - Follow-up    09/26/2017 left tibial calcaneal fusion       HPI: Patient is an 85 year old woman who is 7 months status post left tibial calcaneal fusion she has massive swelling of both lower extremities with venous insufficiency and is currently wearing a medical compression stocking on the left.  The sock is bunched up and caused pressure areas on her leg.  Assessment & Plan: Visit Diagnoses:  1. S/P ankle fusion   2. Diabetic polyneuropathy associated with type 2 diabetes mellitus ([place])   3. Severe protein-calorie malnutrition ([place])   4. Idiopathic chronic venous hypertension of both lower extremities with inflammation     Plan: Patient was given instructions for her to wear compression stockings on both lower extremities the top of the stocking is to be folded down 3 fingerbreadths to prevent rolling down at the top.  The importance of checking her socks daily to ensure there are no wrinkles was also reinforced.  Follow-Up Instructions: Return in about 4 weeks (around 05/23/2018).   Ortho Exam  Patient is alert, oriented, no adenopathy, well-dressed, normal affect, normal respiratory effort. Examination patient is currently ambulating with regular shoes with a Rollator walker.  She has venous stasis swelling with pitting edema and brawny skin color changes in both legs there are no venous stasis ulcers there is no cellulitis the left leg that she is wearing the compression stocking measures 41 cm in circumference the right calf is 43 cm in circumference.  There are several bands in her leg worst the sock is been allowed to roll down.  There are no open ulcers.  Imaging:  No results found. No images are attached to the encounter.  Labs: Lab Results  Component Value Date   HGBA1C 7.1 (A) 09/21/2017   HGBA1C 6.8 (H) 04/17/2017   HGBA1C 7.2 (H) 10/20/2014   ESRSEDRATE 9 04/17/2017   ESRSEDRATE 11 02/04/2009   CRP 3.4 04/17/2017   CRP 0.8 (H) 02/04/2009   REPTSTATUS 05/18/2017 FINAL 05/12/2017   GRAMSTAIN  03/08/2017    NO WBC SEEN FEW GRAM NEGATIVE RODS FEW GRAM POSITIVE COCCI IN PAIRS RARE GRAM POSITIVE RODS Performed at [facility], [place] [address] [place], [place] [address]    CULT  05/12/2017    NO GROWTH 5 DAYS Performed at [facility] [address] [place], [place] [address]    [place] 03/08/2017   LABORGA ENTEROCOCCUS FAECALIS 03/08/2017     Lab Results  Component Value Date   ALBUMIN 2.2 (L) 05/29/2017   ALBUMIN 2.2 (L) 05/28/2017   ALBUMIN 2.2 (L) 05/27/2017    Body mass index is 30.54 kg/m.  Orders:  No orders of the defined types were placed in this encounter.  No orders of the defined types were placed in this encounter.    Procedures: No procedures performed  Clinical Data: No additional findings.  ROS:  All other systems negative, except as noted in the HPI. Review of Systems  Objective: Vital Signs: Ht 5\' 2"  (  1.575 m)   Wt 167 lb (75.8 kg)   BMI 30.54 kg/m   Specialty Comments:  No specialty comments available.  PMFS History: Patient Active Problem List   Diagnosis Date Noted  . Closed fracture dislocation of left ankle joint   . Pressure injury of skin 05/16/2017  . Acute kidney failure ([place])   . Acute on chronic combined systolic and diastolic CHF (congestive heart failure) ([place])   . NSTEMI (non-ST elevated myocardial infarction) ([place]) 05/13/2017  . HTN (hypertension) 05/13/2017  . Diabetes mellitus ([place]) 04/10/2017  . Venous stasis dermatitis of both lower extremities 04/10/2017  . Neuropathy 04/10/2017  . Chronic osteomyelitis of left foot ([place]) 04/10/2017   . Breast cancer of upper-inner quadrant of right female breast ([place]) 10/05/2014  . Occult blood positive stool 03/24/2014  . History of ITP 04/14/2011   Past Medical History:  Diagnosis Date  . Anemia    takes Ferrous Sulfate daily  . Arthritis   . Basal cell carcinoma of breast 1998  . Bruises easily   . Dizziness   . DM (diabetes mellitus) ([place])    takes Metformin daily  . Eczema   . Emphysema lung ([place])   . GERD (gastroesophageal reflux disease)    takes Protonix daily  . Glaucoma    Bil  . History of hiatal hernia   . HTN (hypertension)    takes Diovan daily  . Hyperlipidemia   . ITP (idiopathic thrombocytopenic purpura) 2/09  . Myocardial infarction ([place])    N-stemi 2019  . Nocturia   . Osteoporosis   . Peripheral edema   . Stroke ([place]) 02/03/09   mid brain, diplopia    Family History  Problem Relation Age of Onset  . Breast cancer Mother 65  . Colon cancer Father   . Diabetes Father        questionable  . Multiple sclerosis Daughter   . Heart disease Maternal Grandfather     Past Surgical History:  Procedure Laterality Date  . ANKLE FUSION Left 09/26/2017   Procedure: LEFT TIB-CALC FUSION;  Surgeon: Newt Minion, MD;  Location: [facility];  Service: Orthopedics;  Laterality: Left;  . BREAST LUMPECTOMY WITH RADIOACTIVE SEED LOCALIZATION Right 10/23/2014   Procedure: RIGHT BREAST LUMPECTOMY WITH RADIOACTIVE SEED LOCALIZATION;  Surgeon: Fanny Skates, MD;  Location: [facility];  Service: General;  Laterality: Right;  . CATARACT EXTRACTION Bilateral   . COLONOSCOPY    . ESOPHAGOGASTRODUODENOSCOPY    . LEFT HEART CATH AND CORONARY ANGIOGRAPHY N/A 07/09/2017   Procedure: LEFT HEART CATH AND CORONARY ANGIOGRAPHY;  Surgeon: Jettie Booze, MD;  Location: [facility];  Service: Cardiovascular;  Laterality: N/A;  . MYOMECTOMY N/A 03/14/1995   late 90's per pt  . PARATHYROIDECTOMY  2008  . REFRACTIVE SURGERY Bilateral   . TONSILLECTOMY  as a child   Social  History   Occupational History  . Not on file  Tobacco Use  . Smoking status: Never Smoker  . Smokeless tobacco: Never Used  Substance and Sexual Activity  . Alcohol use: No    Alcohol/week: 0.0 standard drinks  . Drug use: No  . Sexual activity: Not on file

## 2018-04-26 DIAGNOSIS — I872 Venous insufficiency (chronic) (peripheral): Secondary | ICD-10-CM | POA: Diagnosis not present

## 2018-04-26 DIAGNOSIS — D649 Anemia, unspecified: Secondary | ICD-10-CM | POA: Diagnosis not present

## 2018-04-26 DIAGNOSIS — I5043 Acute on chronic combined systolic (congestive) and diastolic (congestive) heart failure: Secondary | ICD-10-CM | POA: Diagnosis not present

## 2018-04-26 DIAGNOSIS — J439 Emphysema, unspecified: Secondary | ICD-10-CM | POA: Diagnosis not present

## 2018-04-26 DIAGNOSIS — S82892D Other fracture of left lower leg, subsequent encounter for closed fracture with routine healing: Secondary | ICD-10-CM | POA: Diagnosis not present

## 2018-04-26 DIAGNOSIS — I110 Hypertensive heart disease with heart failure: Secondary | ICD-10-CM | POA: Diagnosis not present

## 2018-05-02 DIAGNOSIS — I5043 Acute on chronic combined systolic (congestive) and diastolic (congestive) heart failure: Secondary | ICD-10-CM | POA: Diagnosis not present

## 2018-05-02 DIAGNOSIS — I110 Hypertensive heart disease with heart failure: Secondary | ICD-10-CM | POA: Diagnosis not present

## 2018-05-02 DIAGNOSIS — J439 Emphysema, unspecified: Secondary | ICD-10-CM | POA: Diagnosis not present

## 2018-05-02 DIAGNOSIS — S82892D Other fracture of left lower leg, subsequent encounter for closed fracture with routine healing: Secondary | ICD-10-CM | POA: Diagnosis not present

## 2018-05-02 DIAGNOSIS — D649 Anemia, unspecified: Secondary | ICD-10-CM | POA: Diagnosis not present

## 2018-05-02 DIAGNOSIS — I872 Venous insufficiency (chronic) (peripheral): Secondary | ICD-10-CM | POA: Diagnosis not present

## 2018-05-03 DIAGNOSIS — Z79899 Other long term (current) drug therapy: Secondary | ICD-10-CM | POA: Diagnosis not present

## 2018-05-07 DIAGNOSIS — I872 Venous insufficiency (chronic) (peripheral): Secondary | ICD-10-CM | POA: Diagnosis not present

## 2018-05-07 DIAGNOSIS — S82892D Other fracture of left lower leg, subsequent encounter for closed fracture with routine healing: Secondary | ICD-10-CM | POA: Diagnosis not present

## 2018-05-07 DIAGNOSIS — Z794 Long term (current) use of insulin: Secondary | ICD-10-CM | POA: Diagnosis not present

## 2018-05-07 DIAGNOSIS — I110 Hypertensive heart disease with heart failure: Secondary | ICD-10-CM | POA: Diagnosis not present

## 2018-05-07 DIAGNOSIS — I5043 Acute on chronic combined systolic (congestive) and diastolic (congestive) heart failure: Secondary | ICD-10-CM | POA: Diagnosis not present

## 2018-05-07 DIAGNOSIS — J439 Emphysema, unspecified: Secondary | ICD-10-CM | POA: Diagnosis not present

## 2018-05-07 DIAGNOSIS — Z79899 Other long term (current) drug therapy: Secondary | ICD-10-CM | POA: Diagnosis not present

## 2018-05-07 DIAGNOSIS — R600 Localized edema: Secondary | ICD-10-CM | POA: Diagnosis not present

## 2018-05-07 DIAGNOSIS — E119 Type 2 diabetes mellitus without complications: Secondary | ICD-10-CM | POA: Diagnosis not present

## 2018-05-07 DIAGNOSIS — D649 Anemia, unspecified: Secondary | ICD-10-CM | POA: Diagnosis not present

## 2018-05-07 DIAGNOSIS — I5042 Chronic combined systolic (congestive) and diastolic (congestive) heart failure: Secondary | ICD-10-CM | POA: Diagnosis not present

## 2018-05-09 ENCOUNTER — Telehealth (INDEPENDENT_AMBULATORY_CARE_PROVIDER_SITE_OTHER): Payer: Self-pay | Admitting: Orthopedic Surgery

## 2018-05-09 NOTE — Telephone Encounter (Signed)
Patient left message on machine requesting a Rx for compression socks size XL Patient's call back number [phone]

## 2018-05-10 ENCOUNTER — Telehealth (INDEPENDENT_AMBULATORY_CARE_PROVIDER_SITE_OTHER): Payer: Self-pay

## 2018-05-10 NOTE — Telephone Encounter (Signed)
Pt was called and stated someone will pick up Rx for compression sock for her.

## 2018-05-10 NOTE — Telephone Encounter (Signed)
Pt was called and stated that someone will be coming in to pick up sock for her one day next week.

## 2018-05-13 ENCOUNTER — Telehealth (INDEPENDENT_AMBULATORY_CARE_PROVIDER_SITE_OTHER): Payer: Self-pay | Admitting: Orthopedic Surgery

## 2018-05-13 NOTE — Telephone Encounter (Signed)
Pt called, needs order for compressing stocking faxed to her at [facility] as she cannot pickup. I faxed to her [phone]

## 2018-05-28 DIAGNOSIS — I5042 Chronic combined systolic (congestive) and diastolic (congestive) heart failure: Secondary | ICD-10-CM | POA: Diagnosis not present

## 2018-05-28 DIAGNOSIS — Z79899 Other long term (current) drug therapy: Secondary | ICD-10-CM | POA: Diagnosis not present

## 2018-05-28 DIAGNOSIS — I110 Hypertensive heart disease with heart failure: Secondary | ICD-10-CM | POA: Diagnosis not present

## 2018-05-28 DIAGNOSIS — E119 Type 2 diabetes mellitus without complications: Secondary | ICD-10-CM | POA: Diagnosis not present

## 2018-05-28 DIAGNOSIS — R600 Localized edema: Secondary | ICD-10-CM | POA: Diagnosis not present

## 2018-05-28 DIAGNOSIS — Z794 Long term (current) use of insulin: Secondary | ICD-10-CM | POA: Diagnosis not present

## 2018-06-03 ENCOUNTER — Telehealth (INDEPENDENT_AMBULATORY_CARE_PROVIDER_SITE_OTHER): Payer: Self-pay | Admitting: Orthopedic Surgery

## 2018-06-03 NOTE — Telephone Encounter (Signed)
Patient request order for compression socks size 1X. Faxed to [facility] [phone]

## 2018-06-07 NOTE — Telephone Encounter (Signed)
Orders will be faxed 06/07/2018.

## 2018-06-11 DIAGNOSIS — R600 Localized edema: Secondary | ICD-10-CM | POA: Diagnosis not present

## 2018-06-11 DIAGNOSIS — I110 Hypertensive heart disease with heart failure: Secondary | ICD-10-CM | POA: Diagnosis not present

## 2018-06-11 DIAGNOSIS — E119 Type 2 diabetes mellitus without complications: Secondary | ICD-10-CM | POA: Diagnosis not present

## 2018-06-11 DIAGNOSIS — Z794 Long term (current) use of insulin: Secondary | ICD-10-CM | POA: Diagnosis not present

## 2018-06-11 DIAGNOSIS — L853 Xerosis cutis: Secondary | ICD-10-CM | POA: Diagnosis not present

## 2018-06-11 DIAGNOSIS — Z79899 Other long term (current) drug therapy: Secondary | ICD-10-CM | POA: Diagnosis not present

## 2018-06-11 DIAGNOSIS — I5042 Chronic combined systolic (congestive) and diastolic (congestive) heart failure: Secondary | ICD-10-CM | POA: Diagnosis not present

## 2018-06-12 ENCOUNTER — Telehealth: Payer: Self-pay | Admitting: Orthopedic Surgery

## 2018-06-12 NOTE — Telephone Encounter (Signed)
Called patient she wanted to know if we had the correct fax# to fax Rx for the compression socks. The fax# is [phone]. I read note to patient from [facility]. The number to contact patient is [phone]

## 2018-06-12 NOTE — Telephone Encounter (Signed)
Patient called needing a Rx for compression socks size 1-X. The number to contact patient is [phone]

## 2018-06-12 NOTE — Telephone Encounter (Signed)
I called patient and lvm to inform her that orders were faxed to [facility]. Orders was sent by Tammy on 06/07/2018. Thanks

## 2018-06-13 NOTE — Telephone Encounter (Signed)
Ok, we will re-fax it again.

## 2018-06-13 NOTE — Telephone Encounter (Signed)
Patient called stated the fax # [phone]

## 2018-06-14 ENCOUNTER — Telehealth: Payer: Self-pay | Admitting: Orthopedic Surgery

## 2018-06-14 NOTE — Telephone Encounter (Signed)
Pt called in said she's been trying to get her prescription for compression socks size 1X but instead she keeps getting stocking sent in.  [phone]

## 2018-06-17 NOTE — Telephone Encounter (Signed)
I called and lm on vm to advise that the [facility] rx is an order for  compression socks even though the order says stocking this is actually the alpaca wool socks. To call with any questions.

## 2018-06-18 DIAGNOSIS — L853 Xerosis cutis: Secondary | ICD-10-CM | POA: Diagnosis not present

## 2018-06-18 DIAGNOSIS — R600 Localized edema: Secondary | ICD-10-CM | POA: Diagnosis not present

## 2018-06-18 DIAGNOSIS — E119 Type 2 diabetes mellitus without complications: Secondary | ICD-10-CM | POA: Diagnosis not present

## 2018-06-18 DIAGNOSIS — I110 Hypertensive heart disease with heart failure: Secondary | ICD-10-CM | POA: Diagnosis not present

## 2018-06-18 DIAGNOSIS — I5042 Chronic combined systolic (congestive) and diastolic (congestive) heart failure: Secondary | ICD-10-CM | POA: Diagnosis not present

## 2018-06-18 DIAGNOSIS — Z794 Long term (current) use of insulin: Secondary | ICD-10-CM | POA: Diagnosis not present

## 2018-06-18 DIAGNOSIS — Z79899 Other long term (current) drug therapy: Secondary | ICD-10-CM | POA: Diagnosis not present

## 2018-07-25 DIAGNOSIS — I10 Essential (primary) hypertension: Secondary | ICD-10-CM | POA: Diagnosis not present

## 2018-07-25 DIAGNOSIS — Z79899 Other long term (current) drug therapy: Secondary | ICD-10-CM | POA: Diagnosis not present

## 2018-07-25 DIAGNOSIS — E119 Type 2 diabetes mellitus without complications: Secondary | ICD-10-CM | POA: Diagnosis not present

## 2018-07-25 DIAGNOSIS — M810 Age-related osteoporosis without current pathological fracture: Secondary | ICD-10-CM | POA: Diagnosis not present

## 2018-07-26 DIAGNOSIS — Z79899 Other long term (current) drug therapy: Secondary | ICD-10-CM | POA: Diagnosis not present

## 2018-07-26 DIAGNOSIS — E119 Type 2 diabetes mellitus without complications: Secondary | ICD-10-CM | POA: Diagnosis not present

## 2018-07-26 DIAGNOSIS — M810 Age-related osteoporosis without current pathological fracture: Secondary | ICD-10-CM | POA: Diagnosis not present

## 2018-07-26 DIAGNOSIS — I10 Essential (primary) hypertension: Secondary | ICD-10-CM | POA: Diagnosis not present

## 2019-02-04 IMAGING — DX DG HUMERUS 2V *R*
3 series · 3 of 3 positions shown · non-contrast
Comparison: None.

CLINICAL DATA: Right arm pain, no known injury

EXAM:
RIGHT HUMERUS - 2+ VIEW

[humerus ap]
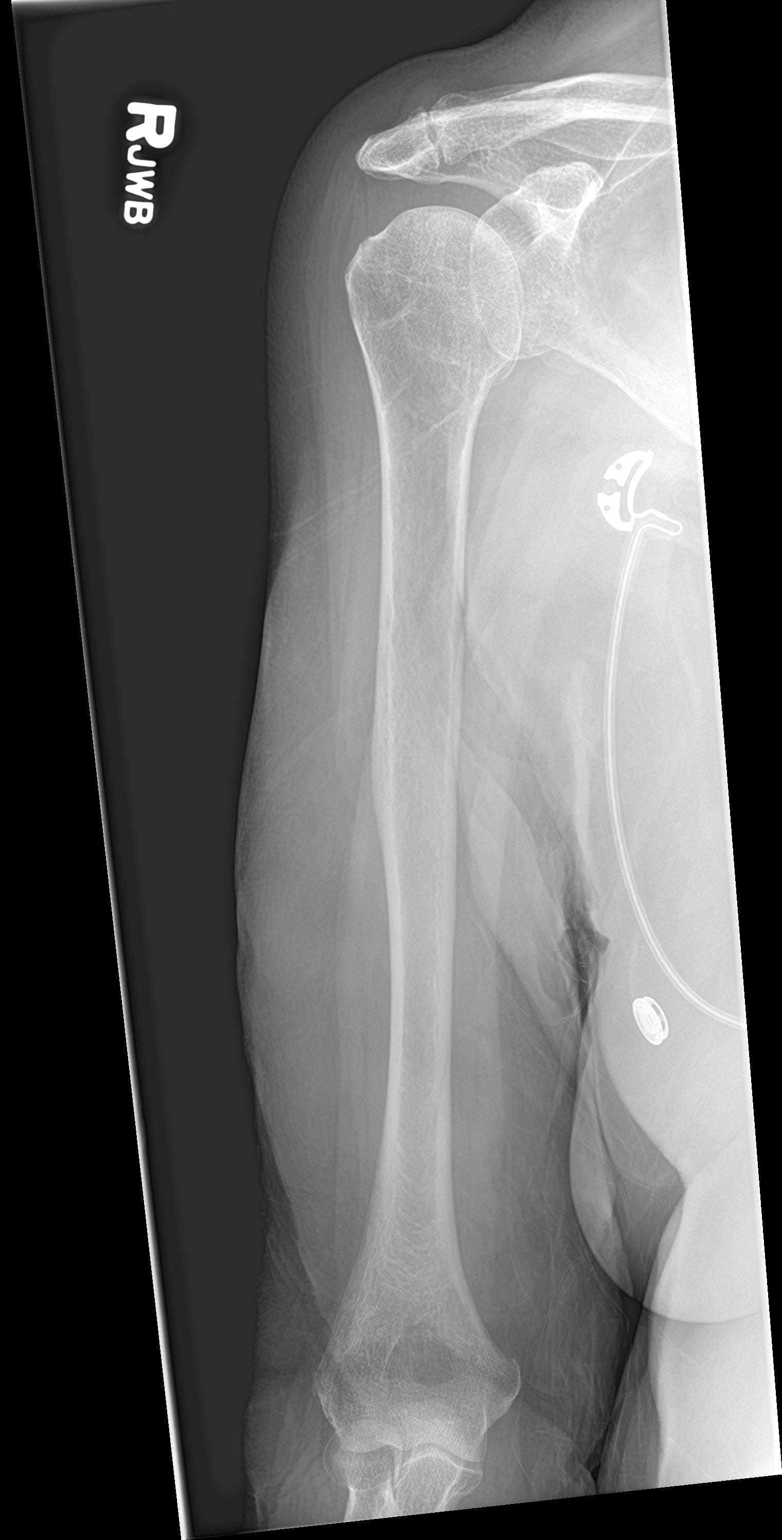

[humerus lat (1 of 2)]
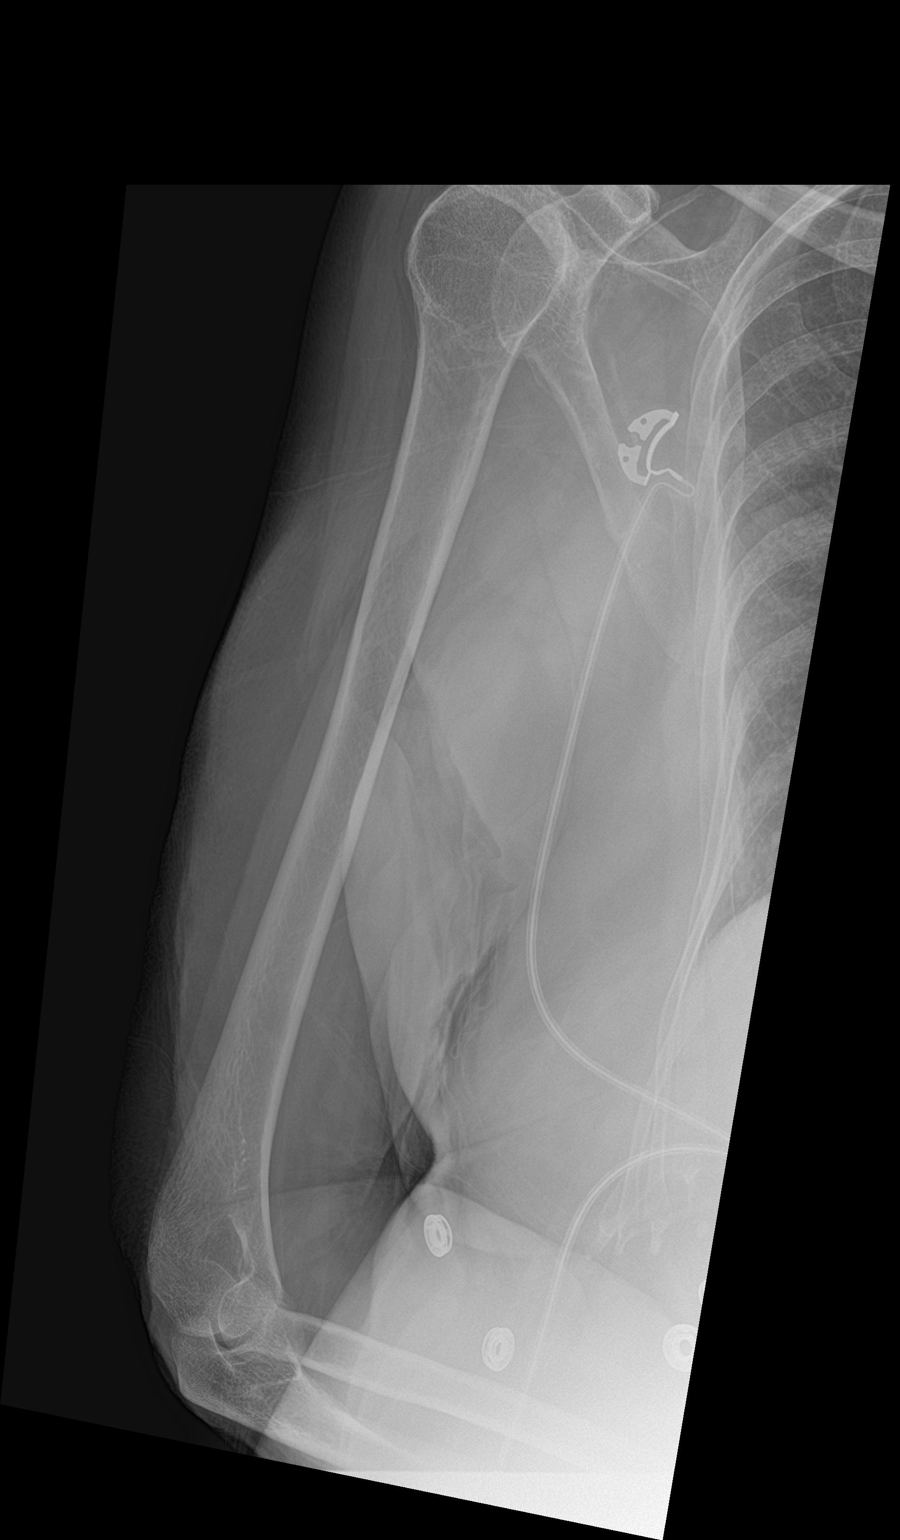

[humerus lat (2 of 2)]
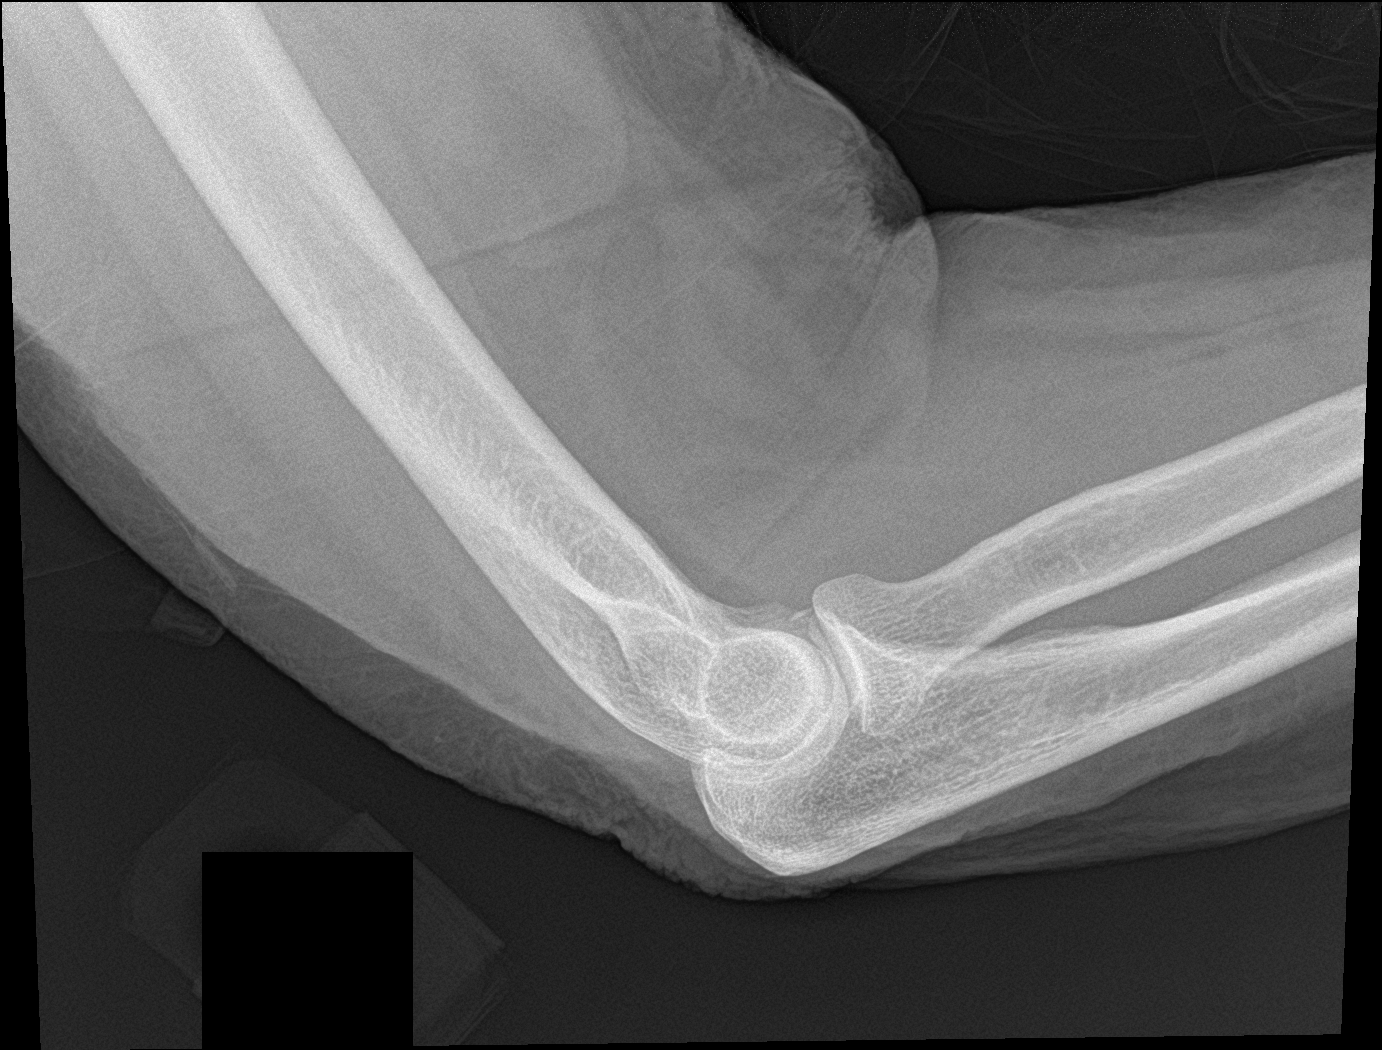

[3 of 3 positions shown; findings below may reference images not displayed]

FINDINGS: Degenerative changes of the glenohumeral articulation are seen. No
acute fracture or dislocation is noted. No soft tissue abnormality
is seen.
IMPRESSION: No acute abnormality noted.

## 2019-02-04 IMAGING — DX DG SHOULDER 2+V*R*
2 series · 2 of 2 positions shown · non-contrast
Comparison: 05/12/2017

CLINICAL DATA: Shoulder pain, no known injury, subsequent encounter

EXAM:
RIGHT SHOULDER - 2+ VIEW

[shoulder grashey]
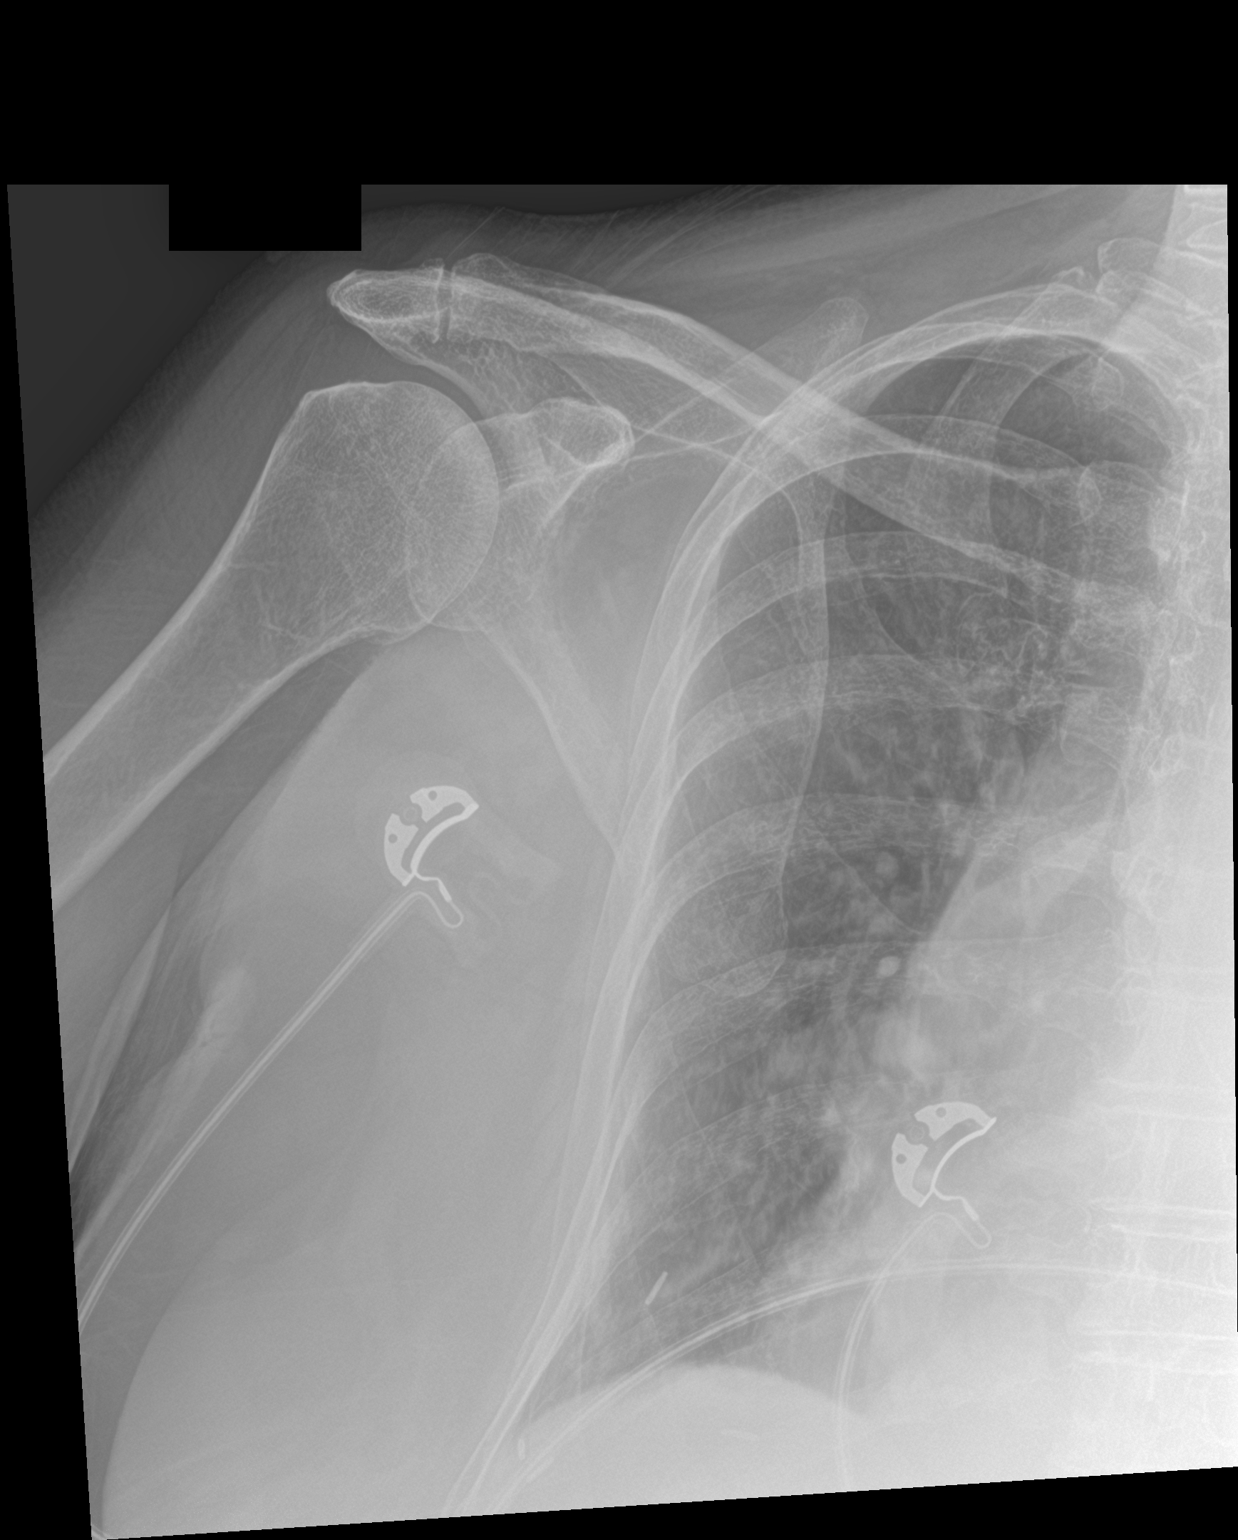

[shoulder y view]
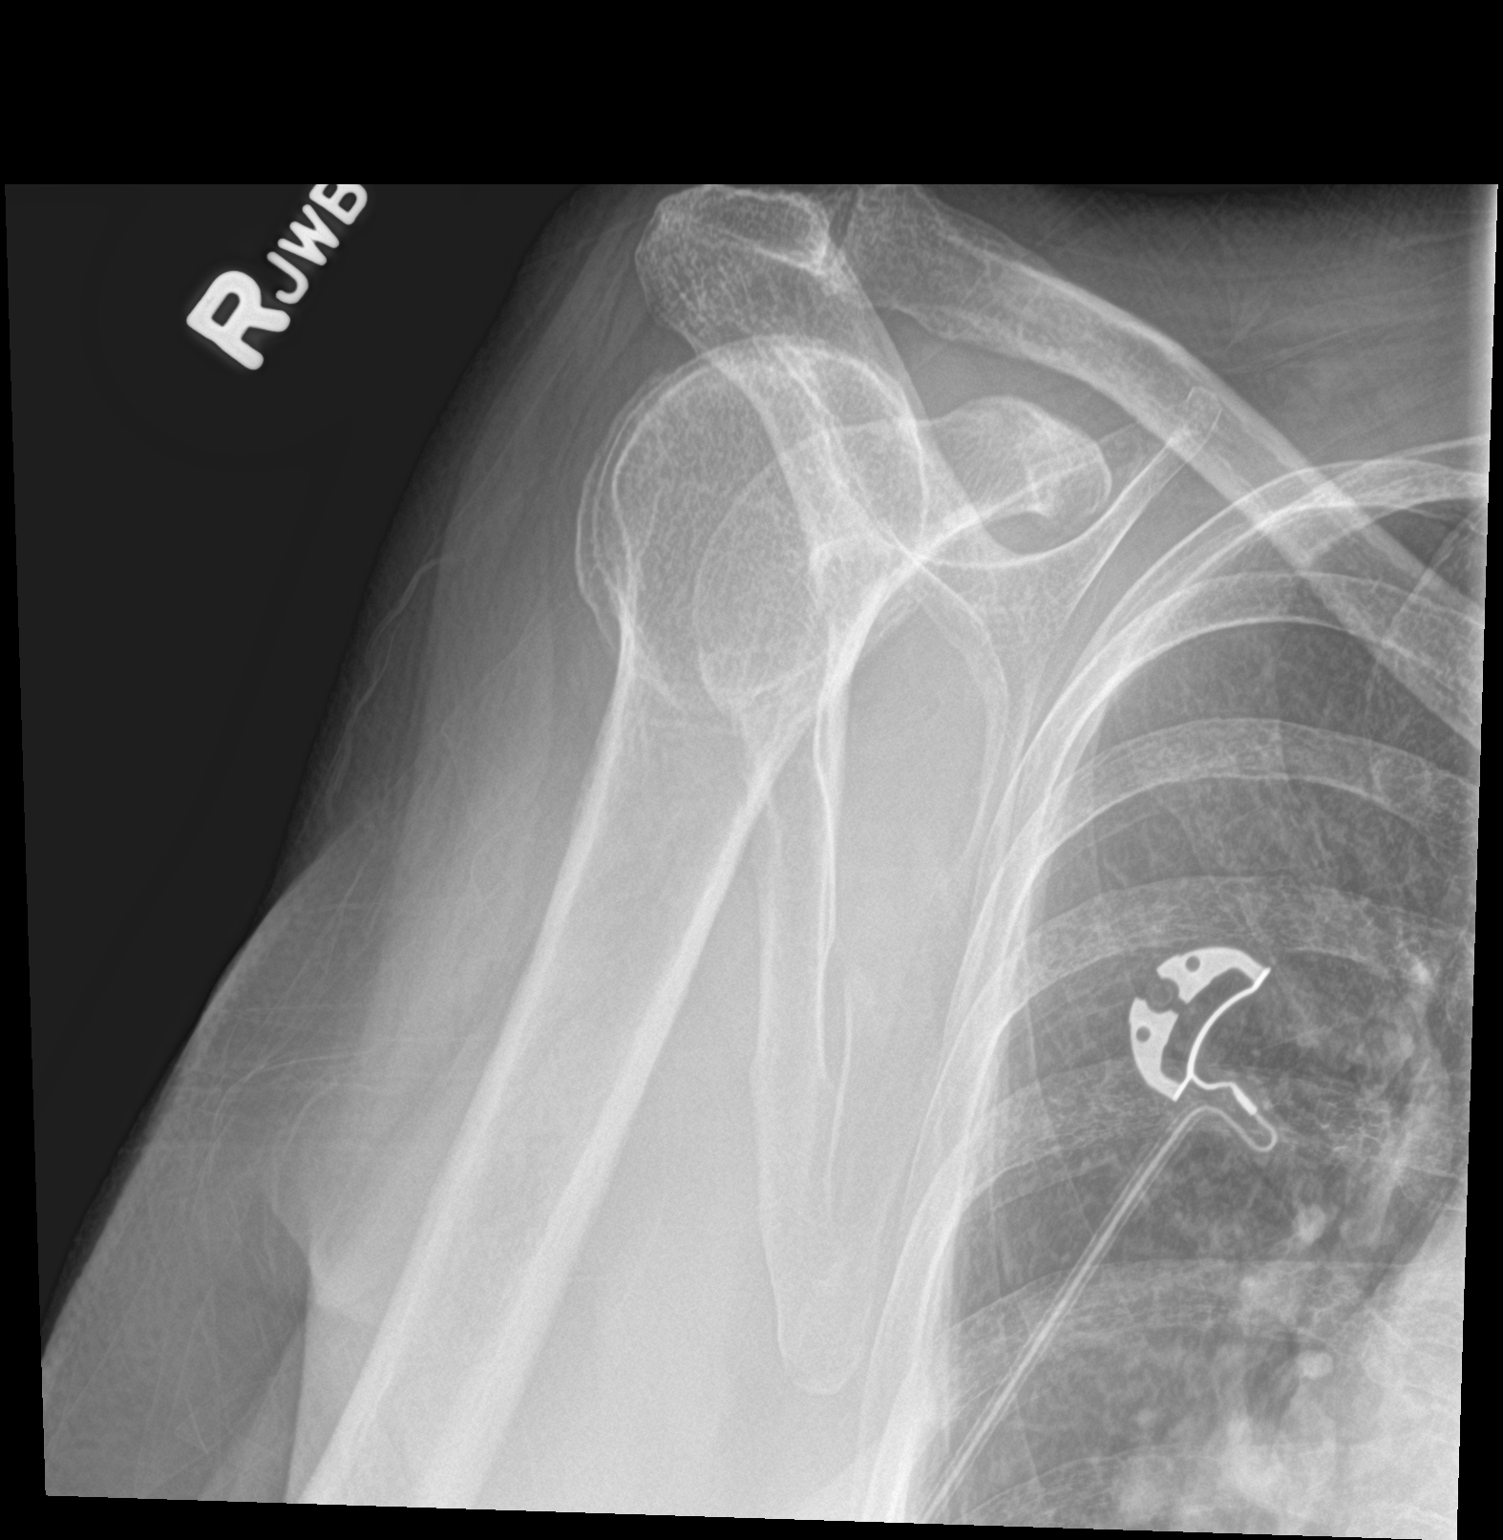

[2 of 2 positions shown; findings below may reference images not displayed]

FINDINGS: Degenerative changes of the acromioclavicular joint are seen.
Glenohumeral degenerative changes are noted as well. No acute
fracture or dislocation is noted. The underlying bony thorax is
within normal limits.
IMPRESSION: No acute abnormality noted.

## 2019-02-04 IMAGING — DX DG FOREARM 2V*R*
2 series · 2 of 2 positions shown · non-contrast
Comparison: None.

CLINICAL DATA: Forearm pain, initial encounter

EXAM:
RIGHT FOREARM - 2 VIEW

[forearm ap]
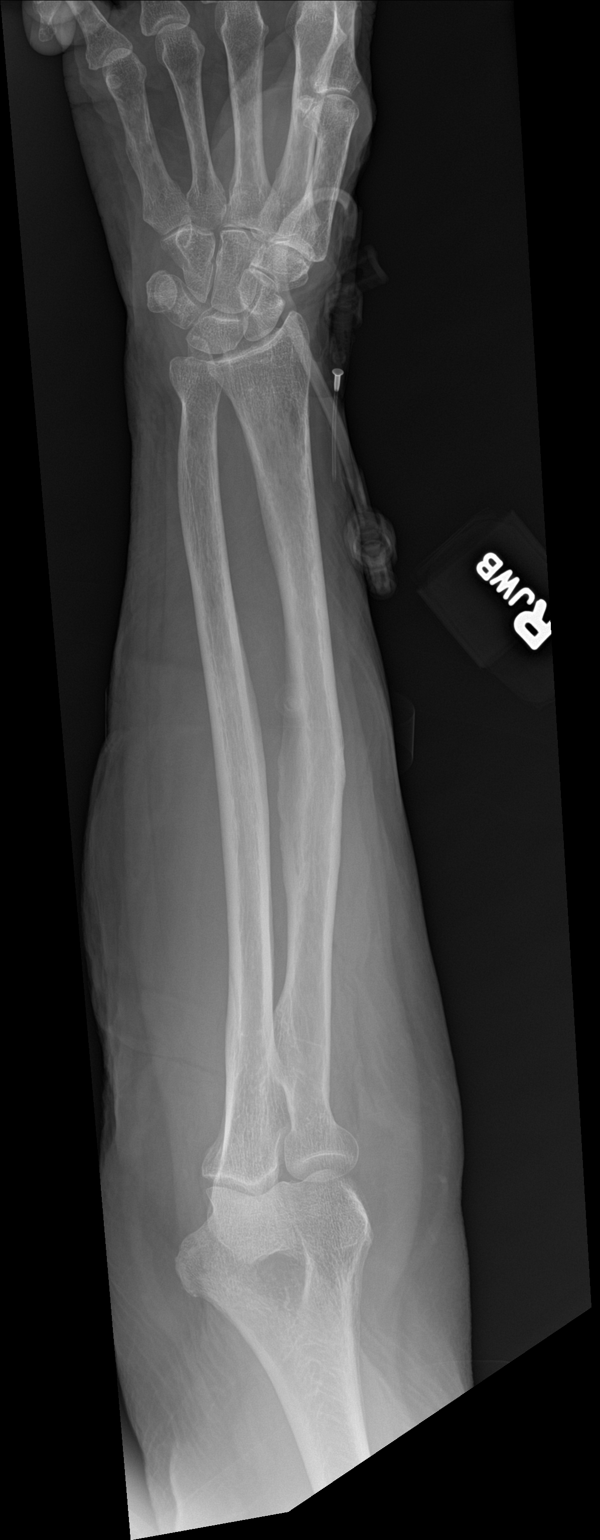

[forearm lat]
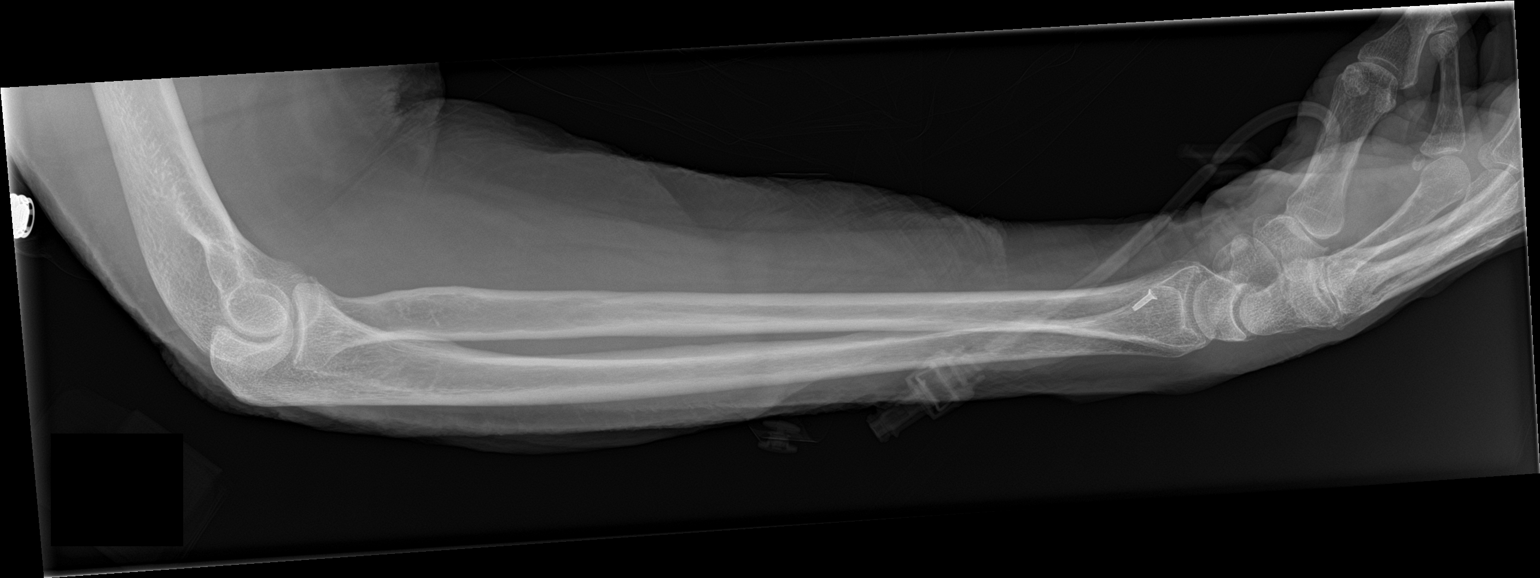

[2 of 2 positions shown; findings below may reference images not displayed]

FINDINGS: There is no evidence of fracture or other focal bone lesions. Soft
tissues are unremarkable.
IMPRESSION: No acute abnormality noted.

## 2019-09-09 ENCOUNTER — Telehealth: Payer: Self-pay

## 2019-09-09 NOTE — Telephone Encounter (Signed)
[name], NP called stating Ms Chelsea [name] is having drainage from her right breast.  She is ordering a mammogram at [facility] but wanted to know if Ms Chelsea [name] would need to be seen here as a new consult.  I told her no that we would schedule her as a established patient.  Scheduling message sent requesting lab and f/u with  Dr. Burr Medico in next couple of weeks.

## 2019-09-10 ENCOUNTER — Telehealth: Payer: Self-pay | Admitting: Hematology

## 2019-09-10 NOTE — Telephone Encounter (Signed)
Scheduled appt per 8/3 sch msg - unable to reach pt .left message for pt with appt date and time.   Unable to reach daughter - no vmail

## 2019-09-11 ENCOUNTER — Other Ambulatory Visit: Payer: Self-pay

## 2019-09-11 DIAGNOSIS — Z170 Estrogen receptor positive status [ER+]: Secondary | ICD-10-CM

## 2019-09-19 ENCOUNTER — Inpatient Hospital Stay: Payer: Medicare HMO

## 2019-09-19 ENCOUNTER — Inpatient Hospital Stay: Payer: Medicare HMO | Admitting: Hematology

## 2019-09-19 ENCOUNTER — Other Ambulatory Visit: Payer: Medicare Other

## 2019-09-25 ENCOUNTER — Inpatient Hospital Stay (HOSPITAL_BASED_OUTPATIENT_CLINIC_OR_DEPARTMENT_OTHER): Payer: Medicare HMO | Admitting: Nurse Practitioner

## 2019-09-25 ENCOUNTER — Inpatient Hospital Stay: Payer: Medicare HMO | Attending: Hematology

## 2019-09-25 ENCOUNTER — Other Ambulatory Visit: Payer: Self-pay

## 2019-09-25 ENCOUNTER — Encounter: Payer: Self-pay | Admitting: Nurse Practitioner

## 2019-09-25 VITALS — BP 151/90 | HR 100 | Temp 97.7°F | Resp 18 | Ht 62.0 in | Wt 195.0 lb

## 2019-09-25 DIAGNOSIS — E785 Hyperlipidemia, unspecified: Secondary | ICD-10-CM | POA: Insufficient documentation

## 2019-09-25 DIAGNOSIS — Z79899 Other long term (current) drug therapy: Secondary | ICD-10-CM | POA: Diagnosis not present

## 2019-09-25 DIAGNOSIS — E1122 Type 2 diabetes mellitus with diabetic chronic kidney disease: Secondary | ICD-10-CM | POA: Insufficient documentation

## 2019-09-25 DIAGNOSIS — C50211 Malignant neoplasm of upper-inner quadrant of right female breast: Secondary | ICD-10-CM | POA: Diagnosis not present

## 2019-09-25 DIAGNOSIS — I252 Old myocardial infarction: Secondary | ICD-10-CM | POA: Insufficient documentation

## 2019-09-25 DIAGNOSIS — C50411 Malignant neoplasm of upper-outer quadrant of right female breast: Secondary | ICD-10-CM

## 2019-09-25 DIAGNOSIS — Z8673 Personal history of transient ischemic attack (TIA), and cerebral infarction without residual deficits: Secondary | ICD-10-CM | POA: Diagnosis not present

## 2019-09-25 DIAGNOSIS — K219 Gastro-esophageal reflux disease without esophagitis: Secondary | ICD-10-CM | POA: Insufficient documentation

## 2019-09-25 DIAGNOSIS — Z7982 Long term (current) use of aspirin: Secondary | ICD-10-CM | POA: Insufficient documentation

## 2019-09-25 DIAGNOSIS — J439 Emphysema, unspecified: Secondary | ICD-10-CM | POA: Diagnosis not present

## 2019-09-25 DIAGNOSIS — Z170 Estrogen receptor positive status [ER+]: Secondary | ICD-10-CM

## 2019-09-25 DIAGNOSIS — N6452 Nipple discharge: Secondary | ICD-10-CM | POA: Diagnosis not present

## 2019-09-25 DIAGNOSIS — M1990 Unspecified osteoarthritis, unspecified site: Secondary | ICD-10-CM | POA: Diagnosis not present

## 2019-09-25 DIAGNOSIS — Z85828 Personal history of other malignant neoplasm of skin: Secondary | ICD-10-CM | POA: Diagnosis not present

## 2019-09-25 DIAGNOSIS — Z794 Long term (current) use of insulin: Secondary | ICD-10-CM | POA: Diagnosis not present

## 2019-09-25 DIAGNOSIS — L298 Other pruritus: Secondary | ICD-10-CM | POA: Diagnosis not present

## 2019-09-25 DIAGNOSIS — I129 Hypertensive chronic kidney disease with stage 1 through stage 4 chronic kidney disease, or unspecified chronic kidney disease: Secondary | ICD-10-CM | POA: Insufficient documentation

## 2019-09-25 LAB — CBC WITH DIFFERENTIAL (CANCER CENTER ONLY)
Abs Immature Granulocytes: 0.02 10*3/uL (ref 0.00–0.07)
Basophils Absolute: 0.1 10*3/uL (ref 0.0–0.1)
Basophils Relative: 1 %
Eosinophils Absolute: 0.2 10*3/uL (ref 0.0–0.5)
Eosinophils Relative: 3 %
HCT: 43.9 % (ref 36.0–46.0)
Hemoglobin: 14.2 g/dL (ref 12.0–15.0)
Immature Granulocytes: 0 %
Lymphocytes Relative: 27 %
Lymphs Abs: 1.6 10*3/uL (ref 0.7–4.0)
MCH: 28.3 pg (ref 26.0–34.0)
MCHC: 32.3 g/dL (ref 30.0–36.0)
MCV: 87.5 fL (ref 80.0–100.0)
Monocytes Absolute: 0.6 10*3/uL (ref 0.1–1.0)
Monocytes Relative: 10 %
Neutro Abs: 3.4 10*3/uL (ref 1.7–7.7)
Neutrophils Relative %: 59 %
Platelet Count: 138 10*3/uL — ABNORMAL LOW (ref 150–400)
RBC: 5.02 MIL/uL (ref 3.87–5.11)
RDW: 13.1 % (ref 11.5–15.5)
WBC Count: 5.7 10*3/uL (ref 4.0–10.5)
nRBC: 0 % (ref 0.0–0.2)

## 2019-09-25 LAB — CMP (CANCER CENTER ONLY)
ALT: 13 U/L (ref 0–44)
AST: 15 U/L (ref 15–41)
Albumin: 3.4 g/dL — ABNORMAL LOW (ref 3.5–5.0)
Alkaline Phosphatase: 84 U/L (ref 38–126)
Anion gap: 7 (ref 5–15)
BUN: 26 mg/dL — ABNORMAL HIGH (ref 8–23)
CO2: 28 mmol/L (ref 22–32)
Calcium: 10 mg/dL (ref 8.9–10.3)
Chloride: 102 mmol/L (ref 98–111)
Creatinine: 1.29 mg/dL — ABNORMAL HIGH (ref 0.44–1.00)
GFR, Est AFR Am: 43 mL/min — ABNORMAL LOW (ref >60)
GFR, Estimated: 37 mL/min — ABNORMAL LOW (ref >60)
Glucose, Bld: 279 mg/dL — ABNORMAL HIGH (ref 70–99)
Potassium: 4.7 mmol/L (ref 3.5–5.1)
Sodium: 137 mmol/L (ref 135–145)
Total Bilirubin: 0.6 mg/dL (ref 0.3–1.2)
Total Protein: 7.1 g/dL (ref 6.5–8.1)

## 2019-09-25 NOTE — Progress Notes (Signed)
[facility]   [phone] [phone]   Clinic Follow up Note   Patient Care Team: System, Pcp Not In as PCP - General Nahser, Wonda Cheng, MD as PCP - Cardiology (Cardiology) Thressa Sheller, MD (Inactive) as Consulting Physician (Internal Medicine) Fanny Skates, MD as Consulting Physician (General Surgery) Truitt Merle, MD as Consulting Physician (Hematology) Thea Silversmith, MD as Consulting Physician (Radiation Oncology) Mauro Kaufmann, RN as Registered Nurse Rockwell Germany, RN as Registered Nurse Jake Shark, Johny Blamer, NP as Nurse Practitioner (Nurse Practitioner) Nickola Major, NP as Nurse Practitioner (Internal Medicine) 09/25/2019  CHIEF COMPLAINT: H/o R breast DCIS   SUMMARY OF ONCOLOGIC HISTORY: Oncology History Overview Note  Breast cancer of upper-inner quadrant of right female breast   Staging form: Breast, AJCC 7th Edition     Clinical: Stage 0 (Tis (DCIS), N0, M0) - Unsigned     Breast cancer of upper-inner quadrant of right female breast ([place])  09/25/2014 Mammogram   Right breast: new cluster of heterogeneous calcifications in upper inner quadrant, suspicious of malignancy.   10/01/2014 Initial Biopsy   Right breast needle core biopsy; ductal carcinoma in situ with necrosis and calcification, ER+ (100%), PR+ (40%)   10/02/2014 Clinical Stage   Stage 0: Tis N0   10/23/2014 Definitive Surgery   Right breast lumpectomy Dalbert Batman): DCIS with calcifications and necrosis, 1 cm, surgical margins were negative.   10/23/2014 Pathologic Stage   Stage 0: Tis N0   01/04/2015 Survivorship   Survivorship care plan completed and mailed to patient in lieu of in person visit.     CURRENT THERAPY: S/p surgical resection 10/2014, no adjuvant RT or endocrine therapy; surveillance   INTERVAL HISTORY: Chelsea [name] returns for right breast concern. She was last seen 05/22/2016. In 2019 she suffered a fall injuring her left ankle. She  was not found for some time, when she was recovered she had low heart rate and her kidneys had started shutting down per her daughter who accompanies her today. Has lived in [facility] since then. She can do some ADLs independently but requires help in the shower, meals are prepared for her. Ambulates with a rolling walker. She deals with diabetes, hypertension but overall her health has not changed in the last year per patient. She has itching underneath the breast intermittently for about a year, she applies what sounds like a foot fungus powder which is helpful. About 2 weeks ago she was scratching at her whole breast and her nipple started bleeding, now the discharge is more yellow. Denies breast pain, mass, fever, chills, bone or other pain, unexplained fatigue, or other new symptoms.    MEDICAL HISTORY:  Past Medical History:  Diagnosis Date  . Anemia    takes Ferrous Sulfate daily  . Arthritis   . Basal cell carcinoma of breast 1998  . Bruises easily   . Dizziness   . DM (diabetes mellitus) ([place])    takes Metformin daily  . Eczema   . Emphysema lung ([place])   . GERD (gastroesophageal reflux disease)    takes Protonix daily  . Glaucoma    Bil  . History of hiatal hernia   . HTN (hypertension)    takes Diovan daily  . Hyperlipidemia   . ITP (idiopathic thrombocytopenic purpura) 2/09  . Myocardial infarction ([place])    N-stemi 2019  . Nocturia   . Osteoporosis   . Peripheral edema   . Stroke ([place]) 02/03/09   mid brain, diplopia  SURGICAL HISTORY: Past Surgical History:  Procedure Laterality Date  . ANKLE FUSION Left 09/26/2017   Procedure: LEFT TIB-CALC FUSION;  Surgeon: Newt Minion, MD;  Location: [facility];  Service: Orthopedics;  Laterality: Left;  . BREAST LUMPECTOMY WITH RADIOACTIVE SEED LOCALIZATION Right 10/23/2014   Procedure: RIGHT BREAST LUMPECTOMY WITH RADIOACTIVE SEED LOCALIZATION;  Surgeon: Fanny Skates, MD;  Location: [facility];  Service: General;  Laterality:  Right;  . CATARACT EXTRACTION Bilateral   . COLONOSCOPY    . ESOPHAGOGASTRODUODENOSCOPY    . LEFT HEART CATH AND CORONARY ANGIOGRAPHY N/A 07/09/2017   Procedure: LEFT HEART CATH AND CORONARY ANGIOGRAPHY;  Surgeon: Jettie Booze, MD;  Location: [facility];  Service: Cardiovascular;  Laterality: N/A;  . MYOMECTOMY N/A 03/14/1995   late 90's per pt  . PARATHYROIDECTOMY  2008  . REFRACTIVE SURGERY Bilateral   . TONSILLECTOMY  as a child    I have reviewed the social history and family history with the patient and they are unchanged from previous note.  ALLERGIES:  is allergic to travatan z [travoprost (bak free)].  MEDICATIONS:  Current Outpatient Medications  Medication Sig Dispense Refill  . acetaminophen (TYLENOL) 500 MG tablet Take 500 mg by mouth every 4 (four) hours as needed for mild pain.    . Amino Acids-Protein Hydrolys (FEEDING SUPPLEMENT, PRO-STAT SUGAR FREE 64,) LIQD Take 30 mLs by mouth daily.    Marland Kitchen aspirin 81 MG chewable tablet Chew 81 mg by mouth daily after breakfast.     . bisacodyl (DULCOLAX) 10 MG suppository Place 10 mg rectally daily as needed for moderate constipation (If not relieved by MOM).     . carvedilol (COREG) 3.125 MG tablet Take 1 tablet (3.125 mg total) by mouth 2 (two) times daily with a meal. 60 tablet 0  . Cholecalciferol 50 MCG (2000 UT) TABS Take 2,000 Units by mouth daily.    . Emollient (CETAPHIL) cream Apply 1 application topically daily. Wash left leg and apply Cetaphil cream and then reapply ACE wrap.  Apply to right leg for dry skin.    . fexofenadine (ALLEGRA) 180 MG tablet Take 180 mg by mouth daily.    . insulin glargine (LANTUS) 100 UNIT/ML injection Inject 5 Units into the skin at bedtime.     . isosorbide mononitrate (IMDUR) 30 MG 24 hr tablet Take 0.5 tablets (15 mg total) by mouth daily. 30 tablet 0  . lactulose (CHRONULAC) 10 GM/15ML solution Take 30 g by mouth daily as needed for mild constipation.     Marland Kitchen losartan (COZAAR) 25  MG tablet Take 25 mg by mouth daily.    . magnesium hydroxide (MILK OF MAGNESIA) 400 MG/5ML suspension Take 30 mLs by mouth daily as needed for mild constipation (If no BM in 3 days).    . magnesium oxide (MAG-OX) 400 (241.3 Mg) MG tablet Take 1 tablet (400 mg total) by mouth 2 (two) times daily. 60 tablet 0  . Multiple Vitamins-Minerals (MULTIVITAMIN PO) Take 1 tablet by mouth daily.    . nitroGLYCERIN (NITROSTAT) 0.4 MG SL tablet Place 1 tablet (0.4 mg total) under the tongue every 5 (five) minutes x 3 doses as needed for chest pain. 10 tablet 0  . Omega-3 Fatty Acids (FISH OIL) 1000 MG CAPS Take 1,000 mg by mouth 2 (two) times daily.     . pantoprazole (PROTONIX) 40 MG tablet Take 40 mg by mouth daily after breakfast.     . polyethylene glycol (MIRALAX / GLYCOLAX) packet Take  17 g by mouth daily.    . polyvinyl alcohol (LIQUIFILM TEARS) 1.4 % ophthalmic solution Place 1 drop into both eyes 2 (two) times daily.    . sennosides-docusate sodium (SENOKOT-S) 8.6-50 MG tablet Take 2 tablets by mouth 2 (two) times daily.    . Sodium Phosphates (RA SALINE ENEMA) 19-7 GM/118ML ENEM Place 1 each rectally daily as needed (for constipation. If not relieved by Bisacodyl Suppository).      No current facility-administered medications for this visit.    PHYSICAL EXAMINATION:  Vitals:   09/25/19 1039  BP: (!) 151/90  Pulse: 100  Resp: 18  Temp: 97.7 F (36.5 C)  SpO2: 96%   Filed Weights   09/25/19 1039  Weight: 195 lb (88.5 kg)    GENERAL:alert, no distress and comfortable SKIN: No rash to exposed skin EYES: sclera clear LUNGS:  normal breathing effort HEART: regular rate & rhythm and no murmurs and no lower extremity edema NEURO: alert & oriented x 3 with fluent speech Breast exam: Limited slightly by posture and performed in chair. Right breast s/p lumpectomy, incisions completely healed. There is no erythema or rash on the breast. There is nipple inversion with signs of white discharge.  There is fullness/firmness in the central breast without discrete mass. No palpable mass in left breast or either axilla that I could appreciate. There is redness to the inframammary folds bilaterally   LABORATORY DATA:  I have reviewed the data as listed CBC Latest Ref Rng & Units 09/25/2019 09/26/2017 06/26/2017  WBC 4.0 - 10.5 K/uL 5.7 5.4 5.0  Hemoglobin 12.0 - 15.0 g/dL 14.2 14.3 11.7(A)  Hematocrit 36 - 46 % 43.9 44.4 35(A)  Platelets 150 - 400 K/uL 138(L) 153 195     CMP Latest Ref Rng & Units 09/25/2019 09/26/2017 08/13/2017  Glucose 70 - 99 mg/dL 279(H) 113(H) -  BUN 8 - 23 mg/dL 26(H) 23 22(A)  Creatinine 0.44 - 1.00 mg/dL 1.29(H) 0.88 0.9  Sodium 135 - 145 mmol/L 137 142 141  Potassium 3.5 - 5.1 mmol/L 4.7 4.0 4.1  Chloride 98 - 111 mmol/L 102 107 -  CO2 22 - 32 mmol/L 28 27 -  Calcium 8.9 - 10.3 mg/dL 10.0 9.4 -  Total Protein 6.5 - 8.1 g/dL 7.1 - -  Total Bilirubin 0.3 - 1.2 mg/dL 0.6 - -  Alkaline Phos 38 - 126 U/L 84 - -  AST 15 - 41 U/L 15 - -  ALT 0 - 44 U/L 13 - -      RADIOGRAPHIC STUDIES: I have personally reviewed the radiological images as listed and agreed with the findings in the report. No results found.   ASSESSMENT & PLAN: 86 yo female with   1. Right breast itching, nipple bleeding and discharge -s/p DCIS 10/2014, no adjuvant radiation or endocrine therapy  -mammogram in 2017 negative -she suffered a fall in 2019 injuring her left leg, she has been in SNF since then.  -noticed breast itching in the inframammary folds a year ago, exam shows yeast rash. I prescribed nystatin.  -she reportedly scratched and the nipple started bleeding 2 weeks ago, with persistent discharge. Exam shows discharge at the nipple with fullness/firmness in the central breast -she has been referred for mammogram by the provider at her facility, [facility], will make sure this includes diagnostic mammogram and [place].  -F/u pending results   2. Right breast DCIS, stage  0, intermediate-grade, ER/PR positive -diagnosed 10/2014, s/p lumpectomy. Margins widely negative.  Due to advanced age and wide margins, adjuvant radiation was not recommended. Due to likely low benefit, plus age and co-morbidities, adjuvant endocrine therapy was not recommended.  -She has been on surveillance, mammogram in 2017 was negative. She has not had one in several years.  -last seen 05/2016  3. Co-morbidities: fall in 2019 with left ankle injury, DM, HTN, CKD -per her co-morbidities worsened after her fall in 2019, but health has been stable lately -F/u PCP   PLAN: -Diagnostic mammogram and ultrasound of the right breast -Follow-up pending results  All questions were answered. The patient knows to call the clinic with any problems, questions or concerns. No barriers to learning was detected.     Chelsea Feeling, NP 09/25/19

## 2019-09-29 ENCOUNTER — Other Ambulatory Visit: Payer: Self-pay | Admitting: Nurse Practitioner

## 2019-09-29 DIAGNOSIS — N6452 Nipple discharge: Secondary | ICD-10-CM

## 2019-09-29 DIAGNOSIS — Z170 Estrogen receptor positive status [ER+]: Secondary | ICD-10-CM

## 2019-09-30 ENCOUNTER — Telehealth: Payer: Self-pay

## 2019-09-30 NOTE — Telephone Encounter (Signed)
Chelsea [name] left vm stating she would not be able to keep her mammo appt on 8/31 at 0940 as she has transportation issues.  She stated her appt will have t be after 1300.  I called [facility] and rescheduled her mammo for 10/20/2019 at 1440 arrive at 1420.  I spoke with Chelsea [name] she now states she has transportation for the 8/31 appt.  I porvided the number for [facility] so that she can rescheduled herself.

## 2019-10-02 ENCOUNTER — Telehealth: Payer: Self-pay | Admitting: Nurse Practitioner

## 2019-10-02 NOTE — Telephone Encounter (Signed)
Called pt per 8/26 sch msg - pt gave me number to SNF - [facility] in [facility] to schedule apt for transportation - [phone] [facility] aware of appt date and time

## 2019-10-03 ENCOUNTER — Other Ambulatory Visit: Payer: Medicare Other

## 2019-10-03 ENCOUNTER — Ambulatory Visit: Payer: Medicare Other | Admitting: Hematology

## 2019-10-07 ENCOUNTER — Other Ambulatory Visit: Payer: Medicare HMO

## 2019-10-20 ENCOUNTER — Ambulatory Visit
Admission: RE | Admit: 2019-10-20 | Discharge: 2019-10-20 | Disposition: A | Discharge status: Home / Self Care | Payer: Medicare HMO | Source: Ambulatory Visit | Attending: Nurse Practitioner | Admitting: Nurse Practitioner

## 2019-10-20 ENCOUNTER — Other Ambulatory Visit: Payer: Self-pay

## 2019-10-20 ENCOUNTER — Other Ambulatory Visit: Payer: Self-pay | Admitting: Nurse Practitioner

## 2019-10-20 DIAGNOSIS — N6452 Nipple discharge: Secondary | ICD-10-CM

## 2019-10-20 DIAGNOSIS — Z170 Estrogen receptor positive status [ER+]: Secondary | ICD-10-CM

## 2019-10-20 DIAGNOSIS — N6310 Unspecified lump in the right breast, unspecified quadrant: Secondary | ICD-10-CM

## 2019-10-22 ENCOUNTER — Ambulatory Visit: Payer: Medicare HMO | Admitting: Nurse Practitioner

## 2019-10-28 ENCOUNTER — Inpatient Hospital Stay: Payer: Medicare HMO | Admitting: Nurse Practitioner

## 2019-10-29 ENCOUNTER — Telehealth: Payer: Self-pay | Admitting: Nurse Practitioner

## 2019-10-29 NOTE — Telephone Encounter (Signed)
Rescheduled appts on 9/21 to 10/6 per 9/20 sch message. Pt transportation is only available on tuesdays and wednesdays and requested around 1pm for appts. Unable to schedule the week of 9/28. No available times. Scheduled pt on 10/6. Spoke with [place] in living facility and is aware of appt time and date.

## 2019-10-30 ENCOUNTER — Ambulatory Visit
Admission: RE | Admit: 2019-10-30 | Discharge: 2019-10-30 | Disposition: A | Discharge status: Home / Self Care | Payer: Medicare HMO | Source: Ambulatory Visit | Attending: Nurse Practitioner | Admitting: Nurse Practitioner

## 2019-10-30 ENCOUNTER — Other Ambulatory Visit: Payer: Self-pay

## 2019-10-30 DIAGNOSIS — N6310 Unspecified lump in the right breast, unspecified quadrant: Secondary | ICD-10-CM

## 2019-10-30 DIAGNOSIS — N6452 Nipple discharge: Secondary | ICD-10-CM

## 2019-11-04 ENCOUNTER — Telehealth: Payer: Self-pay

## 2019-11-04 NOTE — Telephone Encounter (Signed)
Ms Chelsea [name] called stating she has a surgery consult on 11/17/2019 and wanted to know if she needed to keep her appt on 10/6 with Dr. Burr Medico.  Per Dr. Burr Medico Ms Chelsea [name] can cancel her appt and reschedule after surgery.  I reviewed this with Ms Chelsea [name].  She verbalized understanding.  I informed Chelsea [name] transportation that her 10/6 appt has been cancelled.

## 2019-11-12 ENCOUNTER — Inpatient Hospital Stay: Payer: Medicare HMO | Admitting: Hematology

## 2019-11-17 ENCOUNTER — Other Ambulatory Visit: Payer: Self-pay | Admitting: Surgery

## 2019-11-17 DIAGNOSIS — D241 Benign neoplasm of right breast: Secondary | ICD-10-CM

## 2020-01-13 ENCOUNTER — Other Ambulatory Visit: Payer: Self-pay | Admitting: Surgery

## 2020-01-13 DIAGNOSIS — D241 Benign neoplasm of right breast: Secondary | ICD-10-CM

## 2020-02-09 ENCOUNTER — Other Ambulatory Visit (HOSPITAL_COMMUNITY)
Admission: RE | Admit: 2020-02-09 | Discharge: 2020-02-09 | Disposition: A | Discharge status: Home / Self Care | Payer: Medicare HMO | Source: Ambulatory Visit | Attending: Surgery | Admitting: Surgery

## 2020-02-09 DIAGNOSIS — Z01812 Encounter for preprocedural laboratory examination: Secondary | ICD-10-CM | POA: Diagnosis present

## 2020-02-09 DIAGNOSIS — Z20822 Contact with and (suspected) exposure to covid-19: Secondary | ICD-10-CM | POA: Insufficient documentation

## 2020-02-10 LAB — SARS CORONAVIRUS 2 (TAT 6-24 HRS): SARS Coronavirus 2: NEGATIVE

## 2020-02-11 ENCOUNTER — Other Ambulatory Visit: Payer: Self-pay

## 2020-02-11 ENCOUNTER — Encounter (HOSPITAL_COMMUNITY): Payer: Self-pay | Admitting: Surgery

## 2020-02-11 ENCOUNTER — Ambulatory Visit
Admission: RE | Admit: 2020-02-11 | Discharge: 2020-02-11 | Disposition: A | Discharge status: Home / Self Care | Payer: Medicare HMO | Source: Ambulatory Visit | Attending: Surgery | Admitting: Surgery

## 2020-02-11 DIAGNOSIS — D241 Benign neoplasm of right breast: Secondary | ICD-10-CM

## 2020-02-11 NOTE — Anesthesia Preprocedure Evaluation (Addendum)
Anesthesia Evaluation  Patient identified by MRN, date of birth, ID band Patient awake    Reviewed: Allergy & Precautions, NPO status , Patient's Chart, lab work & pertinent test results, reviewed documented beta blocker date and time   Airway Mallampati: III  TM Distance: >3 FB Neck ROM: Full    Dental  (+) Partial Upper, Partial Lower, Dental Advisory Given   Pulmonary COPD,    Pulmonary exam normal breath sounds clear to auscultation       Cardiovascular hypertension, Pt. on home beta blockers and Pt. on medications + Past MI and +CHF  Normal cardiovascular exam Rhythm:Regular Rate:Normal  TTE 2019 - Left ventricle: The cavity size was normal. Wall thickness wasnormal. Systolic function was mildly to moderately reduced. Theestimated ejection fraction was in the range of 40% to 45%. Hypokinesis of the mid-apicalanteroseptal, anterior, and apical myocardium; consistent with ischemia in the distribution of theleft anterior descending coronary artery. Doppler parameters areconsistent with abnormal left ventricular relaxation (grade 1 diastolic dysfunction). Doppler parameters are consistent with elevated mean left atrial filling pressure.  - Tricuspid valve: There was moderate regurgitation.  - Pulmonary arteries: Systolic pressure was mildly increased. PApeak pressure: 36 mm Hg (S).   LHC 2019 Mid LAD lesion is 25% stenosed. Prox LAD lesion is 25% stenosed. Prox Cx to Mid Cx lesion is 10% stenosed. The left ventricular systolic function is normal. The left ventricular ejection fraction is 55-65% by visual estimate. LV end diastolic pressure is normal. There is no aortic valve stenosis.     Neuro/Psych CVA, No Residual Symptoms negative psych ROS   GI/Hepatic Neg liver ROS, hiatal hernia, GERD  Controlled and Medicated,  Endo/Other  negative endocrine ROSdiabetes, Insulin Dependent  Renal/GU Renal  InsufficiencyRenal disease (Cr 1.25, K 4.3)  negative genitourinary   Musculoskeletal  (+) Arthritis ,   Abdominal   Peds  Hematology  (+) Blood dyscrasia (ITP, plt 120), ,   Anesthesia Other Findings   Reproductive/Obstetrics                           Anesthesia Physical Anesthesia Plan  ASA: III  Anesthesia Plan: MAC   Post-op Pain Management:    Induction: Intravenous  PONV Risk Score and Plan: 2 and Ondansetron, Dexamethasone, Treatment may vary due to age or medical condition and Propofol infusion  Airway Management Planned: Natural Airway and Simple Face Mask  Additional Equipment:   Intra-op Plan:   Post-operative Plan:   Informed Consent: I have reviewed the patients History and Physical, chart, labs and discussed the procedure including the risks, benefits and alternatives for the proposed anesthesia with the patient or authorized representative who has indicated his/her understanding and acceptance.     Dental advisory given  Plan Discussed with: CRNA  Anesthesia Plan Comments: (   )     Anesthesia Quick Evaluation

## 2020-02-11 NOTE — Progress Notes (Signed)
No Pharmacies Listed     Your procedure is scheduled on January 6  Report to [facility] Main Entrance "A" at 0915 A.M., and check in at the Admitting office.  Call this number if you have problems the morning of surgery:  [phone]  Call [phone] if you have any questions prior to your surgery date Monday-Friday 8am-4pm    Remember:  Do not eat after midnight the night before your surgery  You may drink clear liquids until 0815 am the morning of your surgery.   Clear liquids allowed are: Water, Non-Citrus Juices (without pulp), Carbonated Beverages, Clear Tea, Black Coffee Only, and Gatorade   ^^^^^PLEASE SEND WITH PATIENT THE MORNING OF SURGERY THE MOST UPDATED MEDICATION MAR, MAKE SURE IT HAS WHAT MEDICATIONS SHE TAKES THE MORNING OF SURGERY AND WHAT TIME THOSE MEDICATIONS WERE GIVEN. ^^^^^    Take these medicines the morning of surgery with A SIP OF WATER  acetaminophen (TYLENOL)  If needed carvedilol (COREG) - patient needs to have this the morning of the procedure very important to give fexofenadine (ALLEGRA) isosorbide mononitrate (IMDUR) pantoprazole (PROTONIX) Eye drops if needed  . Diabetes: THE NIGHT BEFORE SURGERY, take 2 units of Lantus Insulin.     If your blood sugar is less than 70 mg/dL, you will need to treat for low blood sugar: ? Treat a low blood sugar (less than 70 mg/dL) with  cup of clear juice (cranberry or apple), 4glucose tablets, OR glucose gel. ? Recheck blood sugar in 15 minutes after treatment (to make sure it is greater than 70 mg/dL). If your blood sugar is not greater than 70 mg/dL on recheck, call [phone] for further instructions.   Follow your surgeon's instructions on when to stop Aspirin.  If no instructions were given by your surgeon then you will need to call the office to get those instructions.    As of today, STOP taking any Aleve, Naproxen, Ibuprofen, Motrin, Advil, Goody's, BC's, all herbal medications, fish oil, and all  vitamins.                      Do not wear jewelry, make up, or nail polish            Do not wear lotions, powders, perfumes, or deodorant.            Do not shave 48 hours prior to surgery.              Do not bring valuables to the hospital.            [facility] is not responsible for any belongings or valuables.  Do NOT Smoke (Tobacco/Vaping) or drink Alcohol 24 hours prior to your procedure If you use a CPAP at night, you may bring all equipment for your overnight stay.   Contacts, glasses, dentures or bridgework may not be worn into surgery.      For patients admitted to the hospital, discharge time will be determined by your treatment team.   Patients discharged the day of surgery will not be allowed to drive home, and someone needs to stay with them for 24 hours.    Special instructions:     Oral Hygiene is also important to reduce your risk of infection.  Remember - BRUSH YOUR TEETH THE MORNING OF SURGERY WITH YOUR REGULAR TOOTHPASTE   Day of Surgery: Wear Clean/Comfortable clothing the morning of surgery Do not apply any deodorants/lotions.

## 2020-02-11 NOTE — Progress Notes (Signed)
Spoke with Nurse Rayfield Citizen [phone] at [facility] in [place], [place].  Patient has a private room and has been quarantine to her room.  Nurse Rayfield Citizen states patient does not have any SOB, fever, cough or chest pain.  PCP - Kelli Hope, NP Cardiologist - Dr Elease Hashimoto Oncology - Dr Malachy Mood  Chest x-ray - n/a EKG - DOS 02/12/20 Stress Test - n/a ECHO - 05/14/17 Cardiac Cath - 07/09/17  Fasting Blood Sugar - unknown Checks Blood Sugar 1 times a day in the evening  . THE NIGHT BEFORE SURGERY, take 2 units of Lantus insulin      Aspirin Instructions: Follow your surgeon's instructions on when to stop aspirin prior to surgery,  If no instructions were given by your surgeon then you will need to call the office for those instructions.  ERAS: Clears til 8:15 am DOS - Thursday  Anesthesia review: Yes  STOP now taking any Aspirin (unless otherwise instructed by your surgeon), Aleve, Naproxen, Ibuprofen, Motrin, Advil, Goody's, BC's, all herbal medications, fish oil, and all vitamins.   Coronavirus Screening Covid test on 02/09/19 was negative.  Nurse Rayfield Citizen verbalized understanding of instructions that were given via phone. Written instructions were faxed by Shanda Bumps, RN to  [phone].

## 2020-02-11 NOTE — Progress Notes (Signed)
Anesthesia Chart Review: Chelsea [name]   Case: [id number] Date/Time: 02/12/20 1130   Procedure: RIGHT BREAST LUMPECTOMY WITH RADIOACTIVE SEED LOCALIZATION (Right Breast)   Anesthesia type: Choice   Pre-op diagnosis: RIGHT BREAST PAPILLOMA   [facility] ROOM 09 / [facility]   Surgeons: Abigail Miyamoto, MD      DISCUSSION: Patient is an 87 year old female scheduled for the above procedure.  History includes never smoker, ITP (2009), NSTEMI (NSTEMI 05/2017 with apical wall motion abnormality; mild CAD with EF 55-60% 07/09/17 f/u LHC and felt to have recovered stress-induced cardiomyopathy), HLD, HTN, DM2, glaucoma, CVA (02/03/09), emphysema, hiatal hernia, skin cancer, right breast DCIS s/p lumpectomy 10/23/14; developed right nipple discharge, s/p biopsy 10/30/19: intraductal papilloma).  As needed cardiology follow-up recommended at 10/31/17 visit with Dr. Elease Hashimoto. Last labs noted are from 09/25/19. She is getting RSL implanted currently. Apparently was scheduled as a same day work-up since she resides at [facility] in [facility] [phone]).   02/09/20 presurgical COVID-19 test negative.  Anesthesia team to evaluate on the day of surgery.    VS:  BP Readings from Last 3 Encounters:  09/25/19 (!) 151/90  03/12/18 (!) 89/56  02/27/18 117/69   Pulse Readings from Last 3 Encounters:  09/25/19 100  03/12/18 66  02/27/18 81    PROVIDERS: Malachy Mood, MD is HEM-ONC - Elease Hashimoto, Loistine Chance, MD is cardiologist. Stress-induced cardiomyopathy with improved EF and only mild CAD on cath. Continue current medications with as needed cardiology follow-up recommended at 10/31/17 visit.    LABS: Last lab results from 09/25/19 include Cr 1.29, H/H 14.2/43.9, PLT 138, glucose 279.    EKG: Currently last available EKG seen is from 07/09/17.    CV: Cardiac cath 07/09/17:   Mid LAD lesion is 25% stenosed.  Prox LAD lesion is 25% stenosed.  Prox Cx to Mid Cx lesion is 10% stenosed.  The left ventricular  systolic function is normal.  The left ventricular ejection fraction is 55-65% by visual estimate.  LV end diastolic pressure is normal.  There is no aortic valve stenosis.   Nonobstructive CAD.  Continue preventive therapy.   LVEF appears to have improved since the prior echocardiogram.  Her apical RWMA may have been from a stress-induced cardiomyopathy which has resolved.    Echo 05/14/17: Study Conclusions  - Left ventricle: The cavity size was normal. Wall thickness was  normal. Systolic function was mildly to moderately reduced. The  estimated ejection fraction was in the range of 40% to 45%.  Hypokinesis of the mid-apicalanteroseptal, anterior, and apical  myocardium; consistent with ischemia in the distribution of the  left anterior descending coronary artery. Doppler parameters are  consistent with abnormal left ventricular relaxation (grade 1  diastolic dysfunction). Doppler parameters are consistent with  elevated mean left atrial filling pressure.  - Tricuspid valve: There was moderate regurgitation.  - Pulmonary arteries: Systolic pressure was mildly increased. PA  peak pressure: 36 mm Hg (S).    Past Medical History:  Diagnosis Date  . Anemia    takes Ferrous Sulfate daily  . Arthritis   . Basal cell carcinoma of breast 1998  . Bruises easily   . Dizziness   . DM (diabetes mellitus) (HCC)    takes Metformin daily  . Eczema   . Emphysema lung (HCC)   . GERD (gastroesophageal reflux disease)    takes Protonix daily  . Glaucoma    Bil  . History of hiatal hernia   . HTN (hypertension)  takes Diovan daily  . Hyperlipidemia   . ITP (idiopathic thrombocytopenic purpura) 2/09  . Myocardial infarction ([place])    NSTEMI 05/2017; mild CAD with normalization of EF 07/2017 LHC, recovered stress-induced cardiomyopathy  . Nocturia   . Osteoporosis   . Peripheral edema   . Stroke ([place]) 02/03/09   mid brain, diplopia    Past Surgical History:   Procedure Laterality Date  . ANKLE FUSION Left 09/26/2017   Procedure: LEFT TIB-CALC FUSION;  Surgeon: Newt Minion, MD;  Location: [place];  Service: Orthopedics;  Laterality: Left;  . BREAST EXCISIONAL BIOPSY    . BREAST LUMPECTOMY    . BREAST LUMPECTOMY WITH RADIOACTIVE SEED LOCALIZATION Right 10/23/2014   Procedure: RIGHT BREAST LUMPECTOMY WITH RADIOACTIVE SEED LOCALIZATION;  Surgeon: Fanny Skates, MD;  Location: [place];  Service: General;  Laterality: Right;  . CATARACT EXTRACTION Bilateral   . COLONOSCOPY    . ESOPHAGOGASTRODUODENOSCOPY    . LEFT HEART CATH AND CORONARY ANGIOGRAPHY N/A 07/09/2017   Procedure: LEFT HEART CATH AND CORONARY ANGIOGRAPHY;  Surgeon: Jettie Booze, MD;  Location: [place] [facility];  Service: Cardiovascular;  Laterality: N/A;  . MYOMECTOMY N/A 03/14/1995   late 90's per pt  . PARATHYROIDECTOMY  2008  . REFRACTIVE SURGERY Bilateral   . TONSILLECTOMY  as a child    MEDICATIONS: No current facility-administered medications for this encounter.   Marland Kitchen acetaminophen (TYLENOL) 500 MG tablet  . Amino Acids-Protein Hydrolys (FEEDING SUPPLEMENT, PRO-STAT SUGAR FREE 64,) LIQD  . aspirin 81 MG chewable tablet  . bisacodyl (DULCOLAX) 10 MG suppository  . carvedilol (COREG) 3.125 MG tablet  . Cholecalciferol 50 MCG (2000 UT) TABS  . Emollient (CETAPHIL) cream  . fexofenadine (ALLEGRA) 180 MG tablet  . insulin glargine (LANTUS) 100 UNIT/ML injection  . isosorbide mononitrate (IMDUR) 30 MG 24 hr tablet  . lactulose (CHRONULAC) 10 GM/15ML solution  . losartan (COZAAR) 25 MG tablet  . magnesium hydroxide (MILK OF MAGNESIA) 400 MG/5ML suspension  . magnesium oxide (MAG-OX) 400 (241.3 Mg) MG tablet  . Multiple Vitamins-Minerals (MULTIVITAMIN PO)  . nitroGLYCERIN (NITROSTAT) 0.4 MG SL tablet  . Omega-3 Fatty Acids (FISH OIL) 1000 MG CAPS  . pantoprazole (PROTONIX) 40 MG tablet  . polyethylene glycol (MIRALAX / GLYCOLAX) packet  . polyvinyl alcohol (LIQUIFILM  TEARS) 1.4 % ophthalmic solution  . sennosides-docusate sodium (SENOKOT-S) 8.6-50 MG tablet  . Sodium Phosphates (RA SALINE ENEMA) 19-7 GM/118ML ENEM    Myra Gianotti, PA-C Surgical Short Stay/Anesthesiology [facility] Phone [phone] [facility] Phone [phone] 02/11/2020 12:38 PM

## 2020-02-11 NOTE — Progress Notes (Signed)
No Pharmacies Listed     Your procedure is scheduled on January 6  Report to [facility] Main Entrance "A" at 0915 A.M., and check in at the Admitting office.  Call this number if you have problems the morning of surgery:  [phone]  Call [phone] if you have any questions prior to your surgery date Monday-Friday 8am-4pm    Remember:  Do not eat after midnight the night before your surgery  You may drink clear liquids until 0815 am the morning of your surgery.   Clear liquids allowed are: Water, Non-Citrus Juices (without pulp), Carbonated Beverages, Clear Tea, Black Coffee Only, and Gatorade   ^^^^^PLEASE SEND WITH PATIENT THE MORNING OF SURGERY THE MOST UPDATED MEDICATION MAR, MAKE SURE IT HAS WHAT MEDICATIONS SHE TAKES THE MORNING OF SURGERY AND WHAT TIME THOSE MEDICATIONS WERE GIVEN. ^^^^^    Take these medicines the morning of surgery with A SIP OF WATER  acetaminophen (TYLENOL)  If needed carvedilol (COREG) - patient needs to have this the morning of the procedure very important to give fexofenadine (ALLEGRA) isosorbide mononitrate (IMDUR) pantoprazole (PROTONIX) Eye drops if needed  Follow your surgeon's instructions on when to stop Aspirin.  If no instructions were given by your surgeon then you will need to call the office to get those instructions.    As of today, STOP taking any Aleve, Naproxen, Ibuprofen, Motrin, Advil, Goody's, BC's, all herbal medications, fish oil, and all vitamins.                      Do not wear jewelry, make up, or nail polish            Do not wear lotions, powders, perfumes/colognes, or deodorant.            Do not shave 48 hours prior to surgery.              Do not bring valuables to the hospital.            [facility] is not responsible for any belongings or valuables.  Do NOT Smoke (Tobacco/Vaping) or drink Alcohol 24 hours prior to your procedure If you use a CPAP at night, you may bring all equipment for your overnight stay.    Contacts, glasses, dentures or bridgework may not be worn into surgery.      For patients admitted to the hospital, discharge time will be determined by your treatment team.   Patients discharged the day of surgery will not be allowed to drive home, and someone needs to stay with them for 24 hours.    Special instructions:     Oral Hygiene is also important to reduce your risk of infection.  Remember - BRUSH YOUR TEETH THE MORNING OF SURGERY WITH YOUR REGULAR TOOTHPASTE   Day of Surgery: Wear Clean/Comfortable clothing the morning of surgery Do not apply any deodorants/lotions.

## 2020-02-11 NOTE — Progress Notes (Signed)
Patient resides at [facility] [phone] - instructions faxed over to SNF Attempted to contact Rayfield Citizen with [facility] for hx and instructions but no answer left message and call back number  Her number is [phone] Chip Boer fax [phone]  Nothing to eat after midnight. Clear Liquids until 0815 am the morning of surgery  7 days prior to surgery STOP taking any Aspirin (unless otherwise instructed by your surgeon), Aleve, Naproxen, Ibuprofen, Motrin, Advil, Goody's, BC's, all herbal medications, fish oil, and all vitamins.   Anesthesia Review Needed: yes  PCP -  Cardiologist - N[place]   Chest x-ray -  EKG -  Stress Test -  ECHO -  Cardiac Cath -   Fasting Blood Sugar -  Checks Blood Sugar _____ times a day  Aspirin Instructions:  ERAS Protcol - yes clear liquids until 0815 am  COVID TEST- 02/09/19 negative   Anesthesia review: yes  Patient denies shortness of breath, fever, cough and chest pain at PAT appointment   All instructions explained to the patient, with a verbal understanding of the material. Patient agrees to go over the instructions while at home for a better understanding. Patient also instructed to self quarantine after being tested for COVID-19. The opportunity to ask questions was provided.

## 2020-02-11 NOTE — H&P (Signed)
Chelsea [name]  Location: [facility] Patient #: [id number] DOB: 16-Jul-1928 Divorced / Language: [place] / Race: White Female   History of Present Illness  The patient is a 87 year old female who presents with a breast mass.  Chief complaint: Intraductal papilloma right breast  This is a former breast cancer patient of Dr. Jacinto Halim who had a radioactive seed guided lumpectomy of the right breast for DCIS in 2016. Given her age, she had no further treatment. She turned having nipple discharge of a month ago and underwent a mammogram and ultrasound showing an intraductal mass at the 11 o'clock position of the right breast. She underwent a biopsy of this confirming a papilloma. She is still having nipple discharge. She has multiple medical issues and is in a nursing facility.   Past Surgical History Breast Biopsy  Right. Breast Mass; Local Excision  Right. Foot Surgery  Right. Oral Surgery  Tonsillectomy   Diagnostic Studies History   Colonoscopy  1-5 years ago within last year Mammogram  within last year Pap Smear  >5 years ago  Allergies  Travoprost *OPHTHALMIC AGENTS*   Medication History  Nitroglycerin (0.4MG  Tab Sublingual, Sublingual) Active. Lantus SoloStar (100UNIT/ML Soln Pen-inj, Subcutaneous) Active. Lactulose (10GM/15ML Solution, Oral) Active. Furosemide (20MG  Tablet, Oral) Active. Losartan Potassium (50MG  Tablet, Oral) Active. Magnesium Oxide (400 (241.3 Mg)MG Tablet, Oral) Active. Pantoprazole Sodium (40MG  Tablet DR, Oral) Active. Spironolactone (25MG  Tablet, Oral) Active. Vitamin D3 (250 MCG(10000 UT) Capsule, Oral) Active. Medications Reconciled  Social History Caffeine use  Coffee. No alcohol use  No drug use  Tobacco use  Never smoker.  Family History  Breast Cancer  Mother. Colon Cancer  Father.  Pregnancy / Birth History   Age at menarche  14 years. Age of menopause  89-50 >30 Gravida  1 Maternal  age  23-30 34-35 Para  1 Regular periods   Other Problems  Breast Cancer  Diabetes Mellitus  Gastroesophageal Reflux Disease  High blood pressure     Review of Systems  General Not Present- Appetite Loss, Chills, Fatigue, Fever, Night Sweats, Weight Gain and Weight Loss. Skin Not Present- Change in Wart/Mole, Dryness, Hives, Jaundice, New Lesions, Non-Healing Wounds, Rash and Ulcer. HEENT Present- Seasonal Allergies and Wears glasses/contact lenses. Not Present- Earache, Hearing Loss, Hoarseness, Nose Bleed, Oral Ulcers, Ringing in the Ears, Sinus Pain, Sore Throat, Visual Disturbances and Yellow Eyes. Respiratory Not Present- Bloody sputum, Chronic Cough, Difficulty Breathing, Snoring and Wheezing. Breast Present- Breast Mass and Nipple Discharge. Not Present- Breast Pain and Skin Changes. Cardiovascular Present- Swelling of Extremities. Not Present- Chest Pain, Difficulty Breathing Lying Down, Leg Cramps, Palpitations, Rapid Heart Rate and Shortness of Breath. Gastrointestinal Not Present- Abdominal Pain, Bloating, Bloody Stool, Change in Bowel Habits, Chronic diarrhea, Constipation, Difficulty Swallowing, Excessive gas, Gets full quickly at meals, Hemorrhoids, Indigestion, Nausea, Rectal Pain and Vomiting. Female Genitourinary Not Present- Frequency, Nocturia, Painful Urination, Pelvic Pain and Urgency. Musculoskeletal Not Present- Back Pain, Joint Pain, Joint Stiffness, Muscle Pain, Muscle Weakness and Swelling of Extremities. Neurological Present- Trouble walking. Not Present- Decreased Memory, Fainting, Headaches, Numbness, Seizures, Tingling, Tremor and Weakness. Psychiatric Not Present- Anxiety, Bipolar, Change in Sleep Pattern, Depression, Fearful and Frequent crying. Endocrine Not Present- Cold Intolerance, Excessive Hunger, Hair Changes, Heat Intolerance, Hot flashes and New Diabetes. Hematology Present- Easy Bruising. Not Present- Blood Thinners, Excessive bleeding, Gland  problems, HIV and Persistent Infections.  Vitals   Weight: 198.8 lb Height: 62in Body Surface Area: 1.91 m Body Mass Index: 36.36  kg/m  Pulse: 103 (Regular)  BP: 140/88(Sitting, Left Arm, Standard)     Physical Exam  The physical exam findings are as follows: Note: She appears well on exam and walks a walker  There is a large hematoma of the right breast from previous biopsy. There is an inverted nipple. There are no palpable masses and no axillary adenopathy    Assessment & Plan   INTRADUCTAL PAPILLOMA OF BREAST, RIGHT (D24.1)  Impression: I reviewed her notes electronic medical records as well as her mammogram, ultrasound, and pathology results. She has an intraductal papilloma of the right breast causing nipple discharge. Given her previous history of DCIS of the right breast, a radioactive seed lumpectomy is recommended to remove the area of the right breast for complete histologic evaluation. She would like to proceed with surgery given her previous history. I again discussed the surgical procedure with her in detail. We discussed the risk of surgery as well. She understands and wishes to proceed with surgery. Given her age, she may need to stay overnight versus continued staying at the nursing facility.

## 2020-02-12 ENCOUNTER — Encounter (HOSPITAL_COMMUNITY): Payer: Self-pay | Admitting: Surgery

## 2020-02-12 ENCOUNTER — Ambulatory Visit (HOSPITAL_COMMUNITY)
Admission: RE | Admit: 2020-02-12 | Discharge: 2020-02-12 | Disposition: A | Discharge status: Home / Self Care | Payer: Medicare HMO | Attending: Surgery | Admitting: Surgery

## 2020-02-12 ENCOUNTER — Other Ambulatory Visit: Payer: Self-pay

## 2020-02-12 ENCOUNTER — Ambulatory Visit (HOSPITAL_COMMUNITY): Payer: Medicare HMO | Admitting: Vascular Surgery

## 2020-02-12 ENCOUNTER — Ambulatory Visit
Admission: RE | Admit: 2020-02-12 | Discharge: 2020-02-12 | Disposition: A | Discharge status: Home / Self Care | Payer: Medicare HMO | Source: Ambulatory Visit | Attending: Surgery | Admitting: Surgery

## 2020-02-12 ENCOUNTER — Encounter (HOSPITAL_COMMUNITY)
Admission: RE | Disposition: A | Discharge status: Home / Self Care | Payer: Self-pay | Source: Home / Self Care | Attending: Surgery

## 2020-02-12 DIAGNOSIS — Z79899 Other long term (current) drug therapy: Secondary | ICD-10-CM | POA: Diagnosis not present

## 2020-02-12 DIAGNOSIS — I10 Essential (primary) hypertension: Secondary | ICD-10-CM | POA: Insufficient documentation

## 2020-02-12 DIAGNOSIS — Z794 Long term (current) use of insulin: Secondary | ICD-10-CM | POA: Insufficient documentation

## 2020-02-12 DIAGNOSIS — I252 Old myocardial infarction: Secondary | ICD-10-CM | POA: Diagnosis not present

## 2020-02-12 DIAGNOSIS — Z85828 Personal history of other malignant neoplasm of skin: Secondary | ICD-10-CM | POA: Diagnosis not present

## 2020-02-12 DIAGNOSIS — J439 Emphysema, unspecified: Secondary | ICD-10-CM | POA: Diagnosis not present

## 2020-02-12 DIAGNOSIS — I2510 Atherosclerotic heart disease of native coronary artery without angina pectoris: Secondary | ICD-10-CM | POA: Diagnosis not present

## 2020-02-12 DIAGNOSIS — D241 Benign neoplasm of right breast: Secondary | ICD-10-CM

## 2020-02-12 DIAGNOSIS — Z853 Personal history of malignant neoplasm of breast: Secondary | ICD-10-CM | POA: Diagnosis not present

## 2020-02-12 DIAGNOSIS — Z86000 Personal history of in-situ neoplasm of breast: Secondary | ICD-10-CM | POA: Insufficient documentation

## 2020-02-12 DIAGNOSIS — D693 Immune thrombocytopenic purpura: Secondary | ICD-10-CM | POA: Insufficient documentation

## 2020-02-12 DIAGNOSIS — E119 Type 2 diabetes mellitus without complications: Secondary | ICD-10-CM | POA: Insufficient documentation

## 2020-02-12 DIAGNOSIS — Z8673 Personal history of transient ischemic attack (TIA), and cerebral infarction without residual deficits: Secondary | ICD-10-CM | POA: Insufficient documentation

## 2020-02-12 DIAGNOSIS — Z7982 Long term (current) use of aspirin: Secondary | ICD-10-CM | POA: Diagnosis not present

## 2020-02-12 HISTORY — PX: BREAST LUMPECTOMY WITH RADIOACTIVE SEED LOCALIZATION: SHX6424

## 2020-02-12 HISTORY — DX: Dependence on other enabling machines and devices: Z99.89

## 2020-02-12 LAB — BASIC METABOLIC PANEL
Anion gap: 12 (ref 5–15)
BUN: 25 mg/dL — ABNORMAL HIGH (ref 8–23)
CO2: 23 mmol/L (ref 22–32)
Calcium: 9.6 mg/dL (ref 8.9–10.3)
Chloride: 104 mmol/L (ref 98–111)
Creatinine, Ser: 1.25 mg/dL — ABNORMAL HIGH (ref 0.44–1.00)
GFR, Estimated: 41 mL/min — ABNORMAL LOW (ref >60)
Glucose, Bld: 143 mg/dL — ABNORMAL HIGH (ref 70–99)
Potassium: 4.3 mmol/L (ref 3.5–5.1)
Sodium: 139 mmol/L (ref 135–145)

## 2020-02-12 LAB — CBC
HCT: 49.3 % — ABNORMAL HIGH (ref 36.0–46.0)
Hemoglobin: 15.4 g/dL — ABNORMAL HIGH (ref 12.0–15.0)
MCH: 28.8 pg (ref 26.0–34.0)
MCHC: 31.2 g/dL (ref 30.0–36.0)
MCV: 92.3 fL (ref 80.0–100.0)
Platelets: 120 10*3/uL — ABNORMAL LOW (ref 150–400)
RBC: 5.34 MIL/uL — ABNORMAL HIGH (ref 3.87–5.11)
RDW: 13 % (ref 11.5–15.5)
WBC: 5 10*3/uL (ref 4.0–10.5)
nRBC: 0 % (ref 0.0–0.2)

## 2020-02-12 LAB — GLUCOSE, CAPILLARY
Glucose-Capillary: 145 mg/dL — ABNORMAL HIGH (ref 70–99)
Glucose-Capillary: 126 mg/dL — ABNORMAL HIGH (ref 70–99)
Glucose-Capillary: 121 mg/dL — ABNORMAL HIGH (ref 70–99)

## 2020-02-12 SURGERY — BREAST LUMPECTOMY WITH RADIOACTIVE SEED LOCALIZATION
Anesthesia: Monitor Anesthesia Care | Site: Breast | Laterality: Right

## 2020-02-12 MED ORDER — ACETAMINOPHEN 500 MG PO TABS
1000.0000 mg | ORAL_TABLET | Freq: Once | ORAL | Status: AC
  Administered 2020-02-12: 1000 mg via ORAL
  Filled 2020-02-12: qty 2

## 2020-02-12 MED ORDER — BUPIVACAINE-EPINEPHRINE 0.25% -1:200000 IJ SOLN
INTRAMUSCULAR | Status: DC | PRN
  Administered 2020-02-12: 10 mL

## 2020-02-12 MED ORDER — LIDOCAINE 2% (20 MG/ML) 5 ML SYRINGE
INTRAMUSCULAR | Status: AC
  Filled 2020-02-12: qty 10

## 2020-02-12 MED ORDER — PHENYLEPHRINE HCL-NACL 10-0.9 MG/250ML-% IV SOLN
INTRAVENOUS | Status: DC | PRN
  Administered 2020-02-12: 25 ug/min via INTRAVENOUS

## 2020-02-12 MED ORDER — CARVEDILOL 3.125 MG PO TABS
3.1250 mg | ORAL_TABLET | Freq: Once | ORAL | Status: AC
  Administered 2020-02-12: 3.125 mg via ORAL
  Filled 2020-02-12: qty 1

## 2020-02-12 MED ORDER — 0.9 % SODIUM CHLORIDE (POUR BTL) OPTIME
TOPICAL | Status: DC | PRN
  Administered 2020-02-12: 1000 mL

## 2020-02-12 MED ORDER — FENTANYL CITRATE (PF) 100 MCG/2ML IJ SOLN: 25.0000 ug | INTRAMUSCULAR | Status: DC | PRN

## 2020-02-12 MED ORDER — PROPOFOL 500 MG/50ML IV EMUL
INTRAVENOUS | Status: DC | PRN
  Administered 2020-02-12: 100 ug/kg/min via INTRAVENOUS

## 2020-02-12 MED ORDER — TRAMADOL HCL 50 MG PO TABS: 50.0000 mg | ORAL_TABLET | Freq: Four times a day (QID) | ORAL | 0 refills | Status: DC | PRN

## 2020-02-12 MED ORDER — CHLORHEXIDINE GLUCONATE CLOTH 2 % EX PADS: 6.0000 | MEDICATED_PAD | Freq: Once | CUTANEOUS | Status: DC

## 2020-02-12 MED ORDER — LIDOCAINE 2% (20 MG/ML) 5 ML SYRINGE
INTRAMUSCULAR | Status: DC | PRN
  Administered 2020-02-12: 40 mg via INTRAVENOUS

## 2020-02-12 MED ORDER — LIDOCAINE HCL (PF) 1 % IJ SOLN
INTRAMUSCULAR | Status: AC
  Filled 2020-02-12: qty 30

## 2020-02-12 MED ORDER — BUPIVACAINE-EPINEPHRINE 0.5% -1:200000 IJ SOLN
INTRAMUSCULAR | Status: AC
  Filled 2020-02-12: qty 1

## 2020-02-12 MED ORDER — ORAL CARE MOUTH RINSE: 15.0000 mL | Freq: Once | OROMUCOSAL | Status: AC

## 2020-02-12 MED ORDER — FENTANYL CITRATE (PF) 100 MCG/2ML IJ SOLN
INTRAMUSCULAR | Status: AC
  Filled 2020-02-12: qty 2

## 2020-02-12 MED ORDER — FENTANYL CITRATE (PF) 250 MCG/5ML IJ SOLN
INTRAMUSCULAR | Status: DC | PRN
  Administered 2020-02-12: 25 ug via INTRAVENOUS

## 2020-02-12 MED ORDER — ONDANSETRON HCL 4 MG/2ML IJ SOLN
INTRAMUSCULAR | Status: AC
  Filled 2020-02-12: qty 2

## 2020-02-12 MED ORDER — DEXAMETHASONE SODIUM PHOSPHATE 10 MG/ML IJ SOLN
INTRAMUSCULAR | Status: AC
  Filled 2020-02-12: qty 1

## 2020-02-12 MED ORDER — PROPOFOL 10 MG/ML IV BOLUS
INTRAVENOUS | Status: DC | PRN
  Administered 2020-02-12: 10 mg via INTRAVENOUS

## 2020-02-12 MED ORDER — LIDOCAINE HCL 1 % IJ SOLN
INTRAMUSCULAR | Status: DC | PRN
  Administered 2020-02-12: 5 mL

## 2020-02-12 MED ORDER — LACTATED RINGERS IV SOLN: INTRAVENOUS | Status: DC

## 2020-02-12 MED ORDER — CEFAZOLIN SODIUM-DEXTROSE 2-4 GM/100ML-% IV SOLN
2.0000 g | INTRAVENOUS | Status: AC
  Administered 2020-02-12: 2 g via INTRAVENOUS
  Filled 2020-02-12: qty 100

## 2020-02-12 MED ORDER — CHLORHEXIDINE GLUCONATE 0.12 % MT SOLN
15.0000 mL | Freq: Once | OROMUCOSAL | Status: AC
  Administered 2020-02-12: 15 mL via OROMUCOSAL
  Filled 2020-02-12: qty 15

## 2020-02-12 SURGICAL SUPPLY — 31 items
APPLIER CLIP 9.375 MED OPEN (MISCELLANEOUS) ×2
BINDER BREAST LRG (GAUZE/BANDAGES/DRESSINGS) IMPLANT
BINDER BREAST XLRG (GAUZE/BANDAGES/DRESSINGS) IMPLANT
CANISTER SUCT 3000ML PPV (MISCELLANEOUS) ×2 IMPLANT
CHLORAPREP W/TINT 26 (MISCELLANEOUS) ×2 IMPLANT
CLIP APPLIE 9.375 MED OPEN (MISCELLANEOUS) ×1 IMPLANT
COVER PROBE W GEL 5X96 (DRAPES) ×2 IMPLANT
COVER SURGICAL LIGHT HANDLE (MISCELLANEOUS) ×2 IMPLANT
COVER WAND RF STERILE (DRAPES) ×2 IMPLANT
DERMABOND ADVANCED (GAUZE/BANDAGES/DRESSINGS) ×1
DERMABOND ADVANCED .7 DNX12 (GAUZE/BANDAGES/DRESSINGS) ×1 IMPLANT
DEVICE DUBIN SPECIMEN MAMMOGRA (MISCELLANEOUS) ×2 IMPLANT
DRAPE CHEST BREAST 15X10 FENES (DRAPES) ×2 IMPLANT
ELECT CAUTERY BLADE 6.4 (BLADE) ×2 IMPLANT
ELECT REM PT RETURN 9FT ADLT (ELECTROSURGICAL) ×2
ELECTRODE REM PT RTRN 9FT ADLT (ELECTROSURGICAL) ×1 IMPLANT
GLOVE SURG SIGNA 7.5 PF LTX (GLOVE) ×2 IMPLANT
GOWN STRL REUS W/ TWL LRG LVL3 (GOWN DISPOSABLE) ×1 IMPLANT
GOWN STRL REUS W/ TWL XL LVL3 (GOWN DISPOSABLE) ×1 IMPLANT
GOWN STRL REUS W/TWL LRG LVL3 (GOWN DISPOSABLE) ×2
GOWN STRL REUS W/TWL XL LVL3 (GOWN DISPOSABLE) ×2
KIT BASIN OR (CUSTOM PROCEDURE TRAY) ×2 IMPLANT
KIT MARKER MARGIN INK (KITS) ×2 IMPLANT
NEEDLE HYPO 25GX1X1/2 BEV (NEEDLE) ×2 IMPLANT
NS IRRIG 1000ML POUR BTL (IV SOLUTION) IMPLANT
PACK GENERAL/GYN (CUSTOM PROCEDURE TRAY) ×2 IMPLANT
SUT MNCRL AB 4-0 PS2 18 (SUTURE) ×2 IMPLANT
SUT VIC AB 3-0 SH 18 (SUTURE) ×2 IMPLANT
SYR CONTROL 10ML LL (SYRINGE) ×2 IMPLANT
TOWEL GREEN STERILE (TOWEL DISPOSABLE) ×2 IMPLANT
TOWEL GREEN STERILE FF (TOWEL DISPOSABLE) ×2 IMPLANT

## 2020-02-12 NOTE — Anesthesia Procedure Notes (Signed)
Procedure Name: MAC Date/Time: 02/12/2020 11:52 AM Performed by: Alain Marion, CRNA Pre-anesthesia Checklist: Patient identified, Emergency Drugs available, Suction available and Patient being monitored Oxygen Delivery Method: Simple face mask Induction Type: IV induction Placement Confirmation: positive ETCO2

## 2020-02-12 NOTE — Anesthesia Postprocedure Evaluation (Signed)
Anesthesia Post Note  Patient: Chelsea [name]  Procedure(s) Performed: RIGHT BREAST LUMPECTOMY WITH RADIOACTIVE SEED LOCALIZATION (Right Breast)     Patient location during evaluation: PACU Anesthesia Type: MAC Level of consciousness: awake and alert Pain management: pain level controlled Vital Signs Assessment: post-procedure vital signs reviewed and stable Respiratory status: spontaneous breathing, nonlabored ventilation, respiratory function stable and patient connected to nasal cannula oxygen Cardiovascular status: stable and blood pressure returned to baseline Postop Assessment: no apparent nausea or vomiting Anesthetic complications: no   No complications documented.  Last Vitals:  Vitals:   02/12/20 1310 02/12/20 1325  BP: 119/60 123/67  Pulse: (!) 45 (!) 50  Resp: 15 18  Temp: (!) 36.2 C (!) 36.2 C  SpO2: 97% 98%    Last Pain:  Vitals:   02/12/20 1325  TempSrc:   PainSc: 0-No pain                 Seraya Jobst L Zelena Bushong

## 2020-02-12 NOTE — Discharge Instructions (Signed)
[place] Office Phone Number [phone]  BREAST BIOPSY/ PARTIAL MASTECTOMY: POST OP INSTRUCTIONS  Always review your discharge instruction sheet given to you by the facility where your surgery was performed.  IF YOU HAVE DISABILITY OR FAMILY LEAVE FORMS, YOU MUST BRING THEM TO THE OFFICE FOR PROCESSING.  DO NOT GIVE THEM TO YOUR DOCTOR.  1. A prescription for pain medication may be given to you upon discharge.  Take your pain medication as prescribed, if needed.  If narcotic pain medicine is not needed, then you may take acetaminophen (Tylenol) or ibuprofen (Advil) as needed. 2. Take your usually prescribed medications unless otherwise directed 3. If you need a refill on your pain medication, please contact your pharmacy.  They will contact our office to request authorization.  Prescriptions will not be filled after 5pm or on week-ends. 4. You should eat very light the first 24 hours after surgery, such as soup, crackers, pudding, etc.  Resume your normal diet the day after surgery. 5. Most patients will experience some swelling and bruising in the breast.  Ice packs and a good support bra will help.  Swelling and bruising can take several days to resolve.  6. It is common to experience some constipation if taking pain medication after surgery.  Increasing fluid intake and taking a stool softener will usually help or prevent this problem from occurring.  A mild laxative (Milk of Magnesia or Miralax) should be taken according to package directions if there are no bowel movements after 48 hours. 7. Unless discharge instructions indicate otherwise, you may remove your bandages 24-48 hours after surgery, and you may shower at that time.  You may have steri-strips (small skin tapes) in place directly over the incision.  These strips should be left on the skin for 7-10 days.  If your surgeon used skin glue on the incision, you may shower in 24 hours.  The glue will flake off over the  next 2-3 weeks.  Any sutures or staples will be removed at the office during your follow-up visit. 8. ACTIVITIES:  You may resume regular daily activities (gradually increasing) beginning the next day.  Wearing a good support bra or sports bra minimizes pain and swelling.  You may have sexual intercourse when it is comfortable. a. You may drive when you no longer are taking prescription pain medication, you can comfortably wear a seatbelt, and you can safely maneuver your car and apply brakes. b. RETURN TO WORK:  ______________________________________________________________________________________ 9. You should see your doctor in the office for a follow-up appointment approximately two weeks after your surgery.  Your doctor's nurse will typically make your follow-up appointment when she calls you with your pathology report.  Expect your pathology report 2-3 business days after your surgery.  You may call to check if you do not hear from [place] after three days. 10. OTHER INSTRUCTIONS:OK TO REMOVE BINDER AND SHOWER TOMORROW 11. ICE PACK, TYLENOL, AND IBUPROFEN ALSO FOR PAIN _______________________________________________________________________________________________ _____________________________________________________________________________________________________________________________________ _____________________________________________________________________________________________________________________________________ _____________________________________________________________________________________________________________________________________  WHEN TO CALL YOUR DOCTOR: 1. Fever over 101.0 2. Nausea and/or vomiting. 3. Extreme swelling or bruising. 4. Continued bleeding from incision. 5. Increased pain, redness, or drainage from the incision.  The clinic staff is available to answer your questions during regular business hours.  Please don't hesitate to call and ask to speak to one of  the nurses for clinical concerns.  If you have a medical emergency, go to the nearest emergency room or call 911.  A surgeon from [facility]  is always on call at the hospital.  For further questions, please visit [email or web address]

## 2020-02-12 NOTE — Interval H&P Note (Signed)
History and Physical Interval Note: no change in H and P  02/12/2020 10:46 AM  Chelsea [name]  has presented today for surgery, with the diagnosis of RIGHT BREAST PAPILLOMA.  The various methods of treatment have been discussed with the patient and family. After consideration of risks, benefits and other options for treatment, the patient has consented to  Procedure(s): RIGHT BREAST LUMPECTOMY WITH RADIOACTIVE SEED LOCALIZATION X 2 (Right) as a surgical intervention.  The patient's history has been reviewed, patient examined, no change in status, stable for surgery.  I have reviewed the patient's chart and labs.  Questions were answered to the patient's satisfaction.     Abigail Miyamoto

## 2020-02-12 NOTE — Op Note (Signed)
RIGHT BREAST LUMPECTOMY WITH RADIOACTIVE SEED LOCALIZATION   Procedure Note  MYKENZI VANZILE 02/12/2020   Pre-op Diagnosis: RIGHT BREAST PAPILLOMA     Post-op Diagnosis: same  Procedure(s): RIGHT BREAST LUMPECTOMY WITH RADIOACTIVE SEED LOCALIZATION   Surgeon(s): Abigail Miyamoto, MD  Anesthesia: General  Staff:  Circulator: Sofie Rower, RN Relief Circulator: Laurell Roof, RN Scrub Person: Francesco Sor, CST  Estimated Blood Loss: Minimal               Specimens: sent to path  Indications: This is an 87 year old female with a prior history of right breast cancer who now presents with a right breast papilloma.  The decision has been made to proceed with a radioactive seed guided lumpectomy for removal of the papilloma Anturol malignancy given her previous history  Procedure: The patient was brought to the operating room and identified as the correct patient.  She was placed upon the operating room table and anesthesia was induced.  The radioactive seed was located at the 11:30 position of the right breast adjacent to the areola.  I decided skin with lidocaine made a circumareolar incision with a scalpel.  I then dissected down to the breast tissue with electrocautery.  As I was performing a lumpectomy they do neoprobe the radioactive seed came out of the specimen.  The seed was placed in a separate cup.  I then completed the lumpectomy.  I marked the margins with marker paint.  An x-ray was performed confirming that the previous biopsy marker was in the specimen.  The specimen and the seed were then sent to pathology for evaluation.  Chief hemostasis with cautery.  I then closed the subtenons tissue and deep tissue with interrupted 3-0 Vicryl sutures and closed the skin with a running 4-0 Monocryl.  Dermabond and a binder were then applied.  The patient tolerated the procedure well.  All counts were correct at the end of the procedure.  The patient was then taken in a stable condition  from the operating room to the recovery room.          Abigail Miyamoto   Date: 02/12/2020  Time: 12:46 PM

## 2020-02-12 NOTE — Transfer of Care (Signed)
Immediate Anesthesia Transfer of Care Note  Patient: Chelsea [name]  Procedure(s) Performed: RIGHT BREAST LUMPECTOMY WITH RADIOACTIVE SEED LOCALIZATION (Right Breast)  Patient Location: PACU  Anesthesia Type:MAC  Level of Consciousness: awake, alert , oriented and patient cooperative  Airway & Oxygen Therapy: Patient Spontanous Breathing and Patient connected to face mask oxygen  Post-op Assessment: Report given to RN and Post -op Vital signs reviewed and stable  Post vital signs: Reviewed and stable  Last Vitals:  Vitals Value Taken Time  BP 133/122 02/12/20 1250  Temp    Pulse 53 02/12/20 1253  Resp 17 02/12/20 1253  SpO2 99 % 02/12/20 1253  Vitals shown include unvalidated device data.  Last Pain:  Vitals:   02/12/20 0950  TempSrc:   PainSc: 0-No pain         Complications: No complications documented.

## 2020-02-13 ENCOUNTER — Encounter (HOSPITAL_COMMUNITY): Payer: Self-pay | Admitting: Surgery

## 2020-02-16 LAB — SURGICAL PATHOLOGY

## 2020-11-11 ENCOUNTER — Other Ambulatory Visit: Payer: Self-pay | Admitting: Hematology

## 2020-11-11 DIAGNOSIS — Z1231 Encounter for screening mammogram for malignant neoplasm of breast: Secondary | ICD-10-CM

## 2020-12-16 ENCOUNTER — Ambulatory Visit
Admission: RE | Admit: 2020-12-16 | Discharge: 2020-12-16 | Disposition: A | Discharge status: Home / Self Care | Payer: Medicare HMO | Source: Ambulatory Visit | Attending: Hematology | Admitting: Hematology

## 2020-12-16 ENCOUNTER — Other Ambulatory Visit: Payer: Self-pay

## 2020-12-16 DIAGNOSIS — Z1231 Encounter for screening mammogram for malignant neoplasm of breast: Secondary | ICD-10-CM

## 2020-12-20 ENCOUNTER — Other Ambulatory Visit: Payer: Self-pay | Admitting: Hematology

## 2020-12-20 DIAGNOSIS — N6452 Nipple discharge: Secondary | ICD-10-CM

## 2021-02-17 ENCOUNTER — Other Ambulatory Visit: Payer: Medicare HMO

## 2021-02-24 ENCOUNTER — Ambulatory Visit
Admission: RE | Admit: 2021-02-24 | Discharge: 2021-02-24 | Disposition: A | Discharge status: Home / Self Care | Payer: Medicare HMO | Source: Ambulatory Visit | Attending: Hematology | Admitting: Hematology

## 2021-02-24 DIAGNOSIS — N6452 Nipple discharge: Secondary | ICD-10-CM

## 2021-03-18 ENCOUNTER — Other Ambulatory Visit: Payer: Self-pay | Admitting: Surgery

## 2021-04-06 ENCOUNTER — Other Ambulatory Visit: Payer: Self-pay

## 2021-04-11 ENCOUNTER — Encounter (HOSPITAL_BASED_OUTPATIENT_CLINIC_OR_DEPARTMENT_OTHER)
Admission: RE | Admit: 2021-04-11 | Discharge: 2021-04-11 | Disposition: A | Discharge status: Home / Self Care | Payer: Medicare HMO | Source: Ambulatory Visit | Attending: Surgery | Admitting: Surgery

## 2021-04-11 DIAGNOSIS — Z01818 Encounter for other preprocedural examination: Secondary | ICD-10-CM | POA: Diagnosis present

## 2021-04-11 LAB — BASIC METABOLIC PANEL
Anion gap: 9 (ref 5–15)
BUN: 22 mg/dL (ref 8–23)
CO2: 25 mmol/L (ref 22–32)
Calcium: 9.5 mg/dL (ref 8.9–10.3)
Chloride: 103 mmol/L (ref 98–111)
Creatinine, Ser: 1.11 mg/dL — ABNORMAL HIGH (ref 0.44–1.00)
GFR, Estimated: 47 mL/min — ABNORMAL LOW (ref >60)
Glucose, Bld: 104 mg/dL — ABNORMAL HIGH (ref 70–99)
Potassium: 4.5 mmol/L (ref 3.5–5.1)
Sodium: 137 mmol/L (ref 135–145)

## 2021-04-11 NOTE — Progress Notes (Signed)

## 2021-04-13 NOTE — H&P (Signed)
?PROVIDER: Beverlee Nims, MD ? ?MRN: [id number] ?DOB: [birth year] ?DATE OF ENCOUNTER: 03/18/2021 ?Subjective  ? ?Chief Complaint: new problem (New problem right nipple discharge ) ? ? ?History of Present Illness: ?Chelsea [name] is a 88 y.o. female who is seen today for right breast nipple discharge. She has a previous history of DCIS of the right breast. I performed a radioactive seed guided lumpectomy on her in January of last year for a papilloma nipple discharge. About 2 months ago she started having bloody discharge from the right nipple. She reports has been spontaneous. A mammogram and ultrasound were unremarkable except for one dilated duct extending from the nipple towards the retroareolar breast at the previous biopsy site ? ? ? ?Review of Systems: ?A complete review of systems was obtained from the patient. I have reviewed this information and discussed as appropriate with the patient. See HPI as well for other ROS. ? ?ROS  ? ?Medical History: ?Past Medical History:  ?Diagnosis Date  ? Anemia  ? Diabetes mellitus without complication (CMS-HCC)  ? GERD (gastroesophageal reflux disease)  ? History of cancer  ? Hypertension  ? ?There is no problem list on file for this patient. ? ?History reviewed. No pertinent surgical history.  ? ?Allergies  ?Allergen Reactions  ? Januvia [Sitagliptin] Unknown  ? ?Current Outpatient Medications on File Prior to Visit  ?Medication Sig Dispense Refill  ? acetaminophen (TYLENOL) 500 MG tablet Take by mouth  ? carvediloL (COREG) 3.125 MG tablet  ? cholecalciferol (VITAMIN D3) 2,000 unit tablet Take by mouth  ? fexofenadine (ALLEGRA) 180 MG tablet Take by mouth  ? FUROsemide (LASIX) 20 MG tablet  ? isosorbide mononitrate (IMDUR) 30 MG ER tablet  ? losartan (COZAAR) 50 MG tablet  ? spironolactone (ALDACTONE) 25 MG tablet  ? traMADoL (ULTRAM) 50 mg tablet Take 50 mg by mouth every 6 (six) hours as needed  ? ?No current facility-administered medications on file prior to  visit.  ? ?Family History  ?Problem Relation Age of Onset  ? Breast cancer Mother  ? Colon cancer Father  ? ? ?Social History  ? ?Tobacco Use  ?Smoking Status Never  ?Smokeless Tobacco Never  ? ? ?Social History  ? ?Socioeconomic History  ? Marital status: Divorced  ?Tobacco Use  ? Smoking status: Never  ? Smokeless tobacco: Never  ?Substance and Sexual Activity  ? Alcohol use: Never  ? Drug use: Never  ? ?Objective:  ? ?Vitals:  ?03/18/21 1103  ?BP: 122/76  ?Pulse: 64  ?Temp: 36.3 ?C (97.3 ?F)  ?SpO2: 96%  ?Weight: 79.2 kg (174 lb 9.6 oz)  ?Height: 157.5 cm ('5\' 2"'$ )  ? ?Body mass index is 31.93 kg/m?. ? ?Physical Exam  ? ?She is walking with a walker and appears frail ? ?There is no palpable right breast mass. There is some scabbed old blood at the nipple. There is no axillary adenopathy ? ?Labs, Imaging and Diagnostic Testing: ? ?I reviewed her most recent mammograms and ultrasound ? ?Assessment and Plan:  ? ?Diagnoses and all orders for this visit: ? ?Bloody discharge from right nipple ? ? ? ?At this point, I suspect this may be a papilloma again but given her previous history of DCIS a lumpectomy is recommended. Given her overall medical health, I will try this with just local anesthetic and monitored anesthesia care with a right breast lumpectomy to remove the ducts centrally in the previous biopsy site as this is where the dilated duct is going. She is very  anxious about breast cancer recurrence and the nipple discharge and would like to proceed with surgery which I believe is reasonable. We again discussed the risks of bleeding, infection, the need for further procedures if malignancy is found, cardiopulmonary issues, etc. Surgery will be scheduled.  ?

## 2021-04-14 ENCOUNTER — Encounter (HOSPITAL_BASED_OUTPATIENT_CLINIC_OR_DEPARTMENT_OTHER): Payer: Self-pay | Admitting: Surgery

## 2021-04-14 ENCOUNTER — Ambulatory Visit (HOSPITAL_BASED_OUTPATIENT_CLINIC_OR_DEPARTMENT_OTHER): Payer: Medicare HMO | Admitting: Anesthesiology

## 2021-04-14 ENCOUNTER — Other Ambulatory Visit: Payer: Self-pay

## 2021-04-14 ENCOUNTER — Encounter (HOSPITAL_BASED_OUTPATIENT_CLINIC_OR_DEPARTMENT_OTHER)
Admission: RE | Disposition: A | Discharge status: Home / Self Care | Payer: Self-pay | Source: Home / Self Care | Attending: Surgery

## 2021-04-14 ENCOUNTER — Ambulatory Visit (HOSPITAL_BASED_OUTPATIENT_CLINIC_OR_DEPARTMENT_OTHER)
Admission: RE | Admit: 2021-04-14 | Discharge: 2021-04-14 | Disposition: A | Discharge status: Home / Self Care | Payer: Medicare HMO | Attending: Surgery | Admitting: Surgery

## 2021-04-14 DIAGNOSIS — N6452 Nipple discharge: Secondary | ICD-10-CM | POA: Insufficient documentation

## 2021-04-14 DIAGNOSIS — E119 Type 2 diabetes mellitus without complications: Secondary | ICD-10-CM | POA: Diagnosis not present

## 2021-04-14 DIAGNOSIS — Z8673 Personal history of transient ischemic attack (TIA), and cerebral infarction without residual deficits: Secondary | ICD-10-CM | POA: Diagnosis not present

## 2021-04-14 DIAGNOSIS — Z86000 Personal history of in-situ neoplasm of breast: Secondary | ICD-10-CM | POA: Insufficient documentation

## 2021-04-14 DIAGNOSIS — I509 Heart failure, unspecified: Secondary | ICD-10-CM | POA: Insufficient documentation

## 2021-04-14 DIAGNOSIS — I10 Essential (primary) hypertension: Secondary | ICD-10-CM

## 2021-04-14 DIAGNOSIS — I252 Old myocardial infarction: Secondary | ICD-10-CM | POA: Insufficient documentation

## 2021-04-14 DIAGNOSIS — J449 Chronic obstructive pulmonary disease, unspecified: Secondary | ICD-10-CM

## 2021-04-14 DIAGNOSIS — I110 Hypertensive heart disease with heart failure: Secondary | ICD-10-CM | POA: Insufficient documentation

## 2021-04-14 DIAGNOSIS — N6489 Other specified disorders of breast: Secondary | ICD-10-CM | POA: Insufficient documentation

## 2021-04-14 HISTORY — PX: BREAST LUMPECTOMY: SHX2

## 2021-04-14 LAB — GLUCOSE, CAPILLARY
Glucose-Capillary: 90 mg/dL (ref 70–99)
Glucose-Capillary: 118 mg/dL — ABNORMAL HIGH (ref 70–99)

## 2021-04-14 SURGERY — BREAST LUMPECTOMY
Anesthesia: Monitor Anesthesia Care | Site: Breast | Laterality: Right

## 2021-04-14 MED ORDER — PHENYLEPHRINE HCL-NACL 20-0.9 MG/250ML-% IV SOLN
INTRAVENOUS | Status: DC | PRN
  Administered 2021-04-14: 40 ug/min via INTRAVENOUS

## 2021-04-14 MED ORDER — FENTANYL CITRATE (PF) 100 MCG/2ML IJ SOLN: 25.0000 ug | INTRAMUSCULAR | Status: DC | PRN

## 2021-04-14 MED ORDER — ONDANSETRON HCL 4 MG/2ML IJ SOLN
INTRAMUSCULAR | Status: DC | PRN
  Administered 2021-04-14: 4 mg via INTRAVENOUS

## 2021-04-14 MED ORDER — PROPOFOL 10 MG/ML IV BOLUS
INTRAVENOUS | Status: DC | PRN
  Administered 2021-04-14: 20 mg via INTRAVENOUS

## 2021-04-14 MED ORDER — FENTANYL CITRATE (PF) 100 MCG/2ML IJ SOLN
INTRAMUSCULAR | Status: AC
  Filled 2021-04-14: qty 2

## 2021-04-14 MED ORDER — CHLORHEXIDINE GLUCONATE CLOTH 2 % EX PADS
6.0000 | MEDICATED_PAD | Freq: Once | CUTANEOUS | Status: AC
  Administered 2021-04-14: 12:00:00 6 via TOPICAL

## 2021-04-14 MED ORDER — TRAMADOL HCL 50 MG PO TABS: 50.0000 mg | ORAL_TABLET | Freq: Four times a day (QID) | ORAL | 0 refills | Status: AC | PRN

## 2021-04-14 MED ORDER — ACETAMINOPHEN 500 MG PO TABS
ORAL_TABLET | ORAL | Status: AC
  Filled 2021-04-14: qty 1

## 2021-04-14 MED ORDER — ACETAMINOPHEN 500 MG PO TABS
500.0000 mg | ORAL_TABLET | Freq: Once | ORAL | Status: AC
  Administered 2021-04-14: 12:00:00 500 mg via ORAL

## 2021-04-14 MED ORDER — FENTANYL CITRATE (PF) 100 MCG/2ML IJ SOLN
INTRAMUSCULAR | Status: DC | PRN
  Administered 2021-04-14: 25 ug via INTRAVENOUS

## 2021-04-14 MED ORDER — PHENYLEPHRINE HCL (PRESSORS) 10 MG/ML IV SOLN
INTRAVENOUS | Status: AC
Start: 2021-04-14 — End: ?
  Filled 2021-04-14: qty 1

## 2021-04-14 MED ORDER — LIDOCAINE-EPINEPHRINE (PF) 1 %-1:200000 IJ SOLN
INTRAMUSCULAR | Status: DC | PRN
  Administered 2021-04-14: 14 mL

## 2021-04-14 MED ORDER — OXYCODONE HCL 5 MG PO TABS: 5.0000 mg | ORAL_TABLET | Freq: Once | ORAL | Status: DC | PRN

## 2021-04-14 MED ORDER — AMISULPRIDE (ANTIEMETIC) 5 MG/2ML IV SOLN: 10.0000 mg | Freq: Once | INTRAVENOUS | Status: DC | PRN

## 2021-04-14 MED ORDER — ONDANSETRON HCL 4 MG/2ML IJ SOLN: 4.0000 mg | Freq: Once | INTRAMUSCULAR | Status: DC | PRN

## 2021-04-14 MED ORDER — OXYCODONE HCL 5 MG/5ML PO SOLN: 5.0000 mg | Freq: Once | ORAL | Status: DC | PRN

## 2021-04-14 MED ORDER — CEFAZOLIN SODIUM-DEXTROSE 2-4 GM/100ML-% IV SOLN
INTRAVENOUS | Status: AC
  Filled 2021-04-14: qty 100

## 2021-04-14 MED ORDER — LACTATED RINGERS IV SOLN: INTRAVENOUS | Status: DC

## 2021-04-14 MED ORDER — CEFAZOLIN SODIUM-DEXTROSE 2-4 GM/100ML-% IV SOLN
2.0000 g | INTRAVENOUS | Status: AC
  Administered 2021-04-14: 14:00:00 2 g via INTRAVENOUS

## 2021-04-14 MED ORDER — PROPOFOL 500 MG/50ML IV EMUL
INTRAVENOUS | Status: DC | PRN
  Administered 2021-04-14: 100 ug/kg/min via INTRAVENOUS

## 2021-04-14 SURGICAL SUPPLY — 37 items
ADH SKN CLS APL DERMABOND .7 (GAUZE/BANDAGES/DRESSINGS) ×1
APL PRP STRL LF DISP 70% ISPRP (MISCELLANEOUS) ×1
BLADE SURG 15 STRL LF DISP TIS (BLADE) ×1 IMPLANT
BLADE SURG 15 STRL SS (BLADE) ×2
CANISTER SUCT 1200ML W/VALVE (MISCELLANEOUS) IMPLANT
CHLORAPREP W/TINT 26 (MISCELLANEOUS) ×2 IMPLANT
COVER BACK TABLE 60X90IN (DRAPES) ×2 IMPLANT
COVER MAYO STAND STRL (DRAPES) ×2 IMPLANT
DERMABOND ADVANCED (GAUZE/BANDAGES/DRESSINGS) ×1
DERMABOND ADVANCED .7 DNX12 (GAUZE/BANDAGES/DRESSINGS) ×1 IMPLANT
DRAPE LAPAROTOMY 100X72 PEDS (DRAPES) ×2 IMPLANT
DRAPE UTILITY XL STRL (DRAPES) ×2 IMPLANT
ELECT REM PT RETURN 9FT ADLT (ELECTROSURGICAL) ×2
ELECTRODE REM PT RTRN 9FT ADLT (ELECTROSURGICAL) ×1 IMPLANT
GAUZE SPONGE 4X4 12PLY STRL LF (GAUZE/BANDAGES/DRESSINGS) ×2 IMPLANT
GLOVE SURG POLYISO LF SZ6.5 (GLOVE) ×1 IMPLANT
GLOVE SURG POLYISO LF SZ7 (GLOVE) ×1 IMPLANT
GLOVE SURG SIGNA 7.5 PF LTX (GLOVE) ×2 IMPLANT
GLOVE SURG UNDER POLY LF SZ6.5 (GLOVE) ×1 IMPLANT
GLOVE SURG UNDER POLY LF SZ7 (GLOVE) ×1 IMPLANT
GOWN STRL REUS W/ TWL LRG LVL3 (GOWN DISPOSABLE) ×1 IMPLANT
GOWN STRL REUS W/ TWL XL LVL3 (GOWN DISPOSABLE) ×1 IMPLANT
GOWN STRL REUS W/TWL LRG LVL3 (GOWN DISPOSABLE) ×2
GOWN STRL REUS W/TWL XL LVL3 (GOWN DISPOSABLE) ×4
NDL HYPO 25X1 1.5 SAFETY (NEEDLE) ×1 IMPLANT
NEEDLE HYPO 25X1 1.5 SAFETY (NEEDLE) ×2 IMPLANT
NS IRRIG 1000ML POUR BTL (IV SOLUTION) ×2 IMPLANT
PACK BASIN DAY SURGERY FS (CUSTOM PROCEDURE TRAY) ×2 IMPLANT
PENCIL SMOKE EVACUATOR (MISCELLANEOUS) ×2 IMPLANT
SPONGE T-LAP 4X18 ~~LOC~~+RFID (SPONGE) ×2 IMPLANT
SUT MNCRL AB 4-0 PS2 18 (SUTURE) ×2 IMPLANT
SUT VIC AB 3-0 SH 27 (SUTURE) ×2
SUT VIC AB 3-0 SH 27X BRD (SUTURE) ×1 IMPLANT
SYR CONTROL 10ML LL (SYRINGE) ×2 IMPLANT
TOWEL GREEN STERILE FF (TOWEL DISPOSABLE) ×2 IMPLANT
TUBE CONNECTING 20X1/4 (TUBING) IMPLANT
YANKAUER SUCT BULB TIP NO VENT (SUCTIONS) IMPLANT

## 2021-04-14 NOTE — Op Note (Addendum)
? ?  [place] ?04/14/2021 ? ? ?Pre-op Diagnosis: RIGHT BREAST NIPPLE DISCHARGE ?    ?Post-op Diagnosis: same ? ?Procedure(s): ?RIGHT BREAST LUMPECTOMY WITH CENTRAL DUCT EXCISION ? ?Surgeon(s): ?Coralie Keens, MD ?Albin Felling, MD [place] resident ? ?Anesthesia: Monitor Anesthesia Care ? ?Staff:  ?Circulator: Izora Ribas, RN ?Scrub Person: Lovett Sox, CST ? ?Estimated Blood Loss: Minimal ?              ?Specimens: SENT TO PATH ? ?Indications: This is a 88 year old female who has had previous DCIS of the right breast.  She had a lumpectomy back in 2016.  Last year, and a similar occasion she had a papilloma excised for nipple discharge.  She has now developed further nipple discharge.  Mammograms and ultrasound are unremarkable except for a dilated duct going to the old lumpectomy site.  Lumpectomy was recommended ? ?Procedure: The patient brought to operating identifies correct patient.  She is placed upon the operating table and anesthesia was induced.  Her right breast was prepped and draped in the usual sterile fashion.  Her previous incision at the 12 o'clock position of the right breast was anesthetized with lidocaine.  Incision was then made with a scalpel.  We then dissected down to the breast tissue and then underneath the nipple areolar complex.  She had a chronically inverted nipple and we were unable to evert it.  A dilated duct was identified and we performed a central duct incision and lumpectomy incorporating the breast tissue all underneath the nipple areolar complex especially the 12 o'clock position.  This tissue was sent into several pieces to pathology for evaluation.  The remaining surrounding tissue was normal to palpation.  Hemostasis was achieved with the cautery.  We then closed the subcutaneous tissue with interrupted 3-0 Vicryl sutures and closed the skin with a running 4-0 Monocryl.  Dermabond was then applied.  The patient tolerated the procedure well.  All the counts were  correct at the end of the procedure.  She was then taken in a stable condition from the operating room to the recovery room. ?        ? ?Coralie Keens  ? ?Date: 04/14/2021  Time: 2:59 PM ? ? ? ?

## 2021-04-14 NOTE — Progress Notes (Signed)
RN called report to [facility] to [place]. Waiting on Melissa to call back. CNA with patient has reported that she will let Melissa know to call RN and [facility]CD[facility]. Setzer, Marchelle Folks  ?

## 2021-04-14 NOTE — Anesthesia Postprocedure Evaluation (Signed)
Anesthesia Post Note ? ?Patient: Chelsea [name] ? ?Procedure(s) Performed: RIGHT BREAST LUMPECTOMY (Right: Breast) ? ?  ? ?Patient location during evaluation: PACU ?Anesthesia Type: MAC ?Level of consciousness: awake and alert ?Pain management: pain level controlled ?Vital Signs Assessment: post-procedure vital signs reviewed and stable ?Respiratory status: spontaneous breathing, nonlabored ventilation and respiratory function stable ?Cardiovascular status: stable and blood pressure returned to baseline ?Postop Assessment: no apparent nausea or vomiting ?Anesthetic complications: no ? ? ?No notable events documented. ? ?Last Vitals:  ?Vitals:  ? 04/14/21 1515 04/14/21 1530  ?BP: 124/63 130/77  ?Pulse: (!) 53 (!) 47  ?Resp: 15 13  ?Temp:    ?SpO2: 97% 92%  ?  ?Last Pain:  ?Vitals:  ? 04/14/21 1530  ?TempSrc:   ?PainSc: 0-No pain  ? ? ?  ?  ?  ?  ?  ?  ? ?Merlinda Frederick ? ? ? ? ?

## 2021-04-14 NOTE — Anesthesia Preprocedure Evaluation (Addendum)
Anesthesia Evaluation  ?Patient identified by MRN, date of birth, ID band ?Patient awake ? ? ? ?Reviewed: ?Allergy & Precautions, NPO status , Patient's Chart, lab work & pertinent test results ? ?Airway ?Mallampati: II ? ?TM Distance: >3 FB ?Neck ROM: Full ? ? ? Dental ?no notable dental hx. ? ?  ?Pulmonary ?COPD,  ?  ?Pulmonary exam normal ?breath sounds clear to auscultation ? ? ? ? ? ? Cardiovascular ?Exercise Tolerance: Poor ?hypertension, + Past MI and +CHF  ?Normal cardiovascular exam ?Rhythm:Regular Rate:Normal ? ?Normal sinus rhythm ?Normal ECG ?No significant change since last tracing ?Confirmed by Dorris Carnes [phone]) on 04/11/2021 10:12:09 PM ?  ?Neuro/Psych ?CVA negative psych ROS  ? GI/Hepatic ?Neg liver ROS, hiatal hernia, GERD  ,  ?Endo/Other  ?negative endocrine ROSdiabetes ? Renal/GU ?negative Renal ROS  ?negative genitourinary ?  ?Musculoskeletal ? ?(+) Arthritis , Osteoarthritis,  kyphosis  ? Abdominal ?  ?Peds ?negative pediatric ROS ?(+)  Hematology ? ?(+) Blood dyscrasia, anemia , ITP   ?Anesthesia Other Findings ? ? Reproductive/Obstetrics ?negative OB ROS ? ?  ? ? ? ? ? ? ? ? ? ? ? ? ? ?  ?  ? ? ? ? ? ? ? ?Anesthesia Physical ?Anesthesia Plan ? ?ASA: 4 ? ?Anesthesia Plan: MAC  ? ?Post-op Pain Management: Minimal or no pain anticipated  ? ?Induction: Intravenous ? ?PONV Risk Score and Plan: 2 and Treatment may vary due to age or medical condition and Propofol infusion ? ?Airway Management Planned: Natural Airway and Simple Face Mask ? ?Additional Equipment: None ? ?Intra-op Plan:  ? ?Post-operative Plan:  ? ?Informed Consent: I have reviewed the patients History and Physical, chart, labs and discussed the procedure including the risks, benefits and alternatives for the proposed anesthesia with the patient or authorized representative who has indicated his/her understanding and acceptance.  ? ? ? ? ? ?Plan Discussed with: Anesthesiologist and CRNA ? ?Anesthesia  Plan Comments:   ? ? ? ? ? ? ?Anesthesia Quick Evaluation ? ?

## 2021-04-14 NOTE — Interval H&P Note (Signed)
History and Physical Interval Note:no change in H and P ? ?04/14/2021 ?11:46 AM ? ?Chelsea [name]  has presented today for surgery, with the diagnosis of RIGHT BREAST NIPPLE DISCHARGE.  The various methods of treatment have been discussed with the patient and family. After consideration of risks, benefits and other options for treatment, the patient has consented to  Procedure(s): ?RIGHT BREAST LUMPECTOMY (Right) as a surgical intervention.  The patient's history has been reviewed, patient examined, no change in status, stable for surgery.  I have reviewed the patient's chart and labs.  Questions were answered to the patient's satisfaction.   ? ? ?Coralie Keens ? ? ?

## 2021-04-14 NOTE — Transfer of Care (Signed)
Immediate Anesthesia Transfer of Care Note ? ?Patient: Chelsea [name] ? ?Procedure(s) Performed: RIGHT BREAST LUMPECTOMY (Right: Breast) ? ?Patient Location: PACU ? ?Anesthesia Type:MAC ? ?Level of Consciousness: sedated ? ?Airway & Oxygen Therapy: Patient Spontanous Breathing and Patient connected to face mask oxygen ? ?Post-op Assessment: Report given to RN and Post -op Vital signs reviewed and stable ? ?Post vital signs: Reviewed and stable ? ?Last Vitals:  ?Vitals Value Taken Time  ?BP    ?Temp    ?Pulse    ?Resp    ?SpO2    ? ? ?Last Pain:  ?Vitals:  ? 04/14/21 1204  ?TempSrc: Oral  ?PainSc: 0-No pain  ?   ? ?Patients Stated Pain Goal: 3 (04/14/21 1204) ? ?Complications: No notable events documented. ?

## 2021-04-14 NOTE — Discharge Instructions (Addendum)
[facility] ?Office Phone Number [phone] ? ?BREAST BIOPSY/ PARTIAL MASTECTOMY: POST OP INSTRUCTIONS ? ?Always review your discharge instruction sheet given to you by the facility where your surgery was performed. ? ?IF YOU HAVE DISABILITY OR FAMILY LEAVE FORMS, YOU MUST BRING THEM TO THE OFFICE FOR PROCESSING.  DO NOT GIVE THEM TO YOUR DOCTOR. ? ?A prescription for pain medication may be given to you upon discharge.  Take your pain medication as prescribed, if needed.  If narcotic pain medicine is not needed, then you may take acetaminophen (Tylenol) or ibuprofen (Advil) as needed. ?Take your usually prescribed medications unless otherwise directed ?If you need a refill on your pain medication, please contact your pharmacy.  They will contact our office to request authorization.  Prescriptions will not be filled after 5pm or on week-ends. ?You should eat very light the first 24 hours after surgery, such as soup, crackers, pudding, etc.  Resume your normal diet the day after surgery. ?Most patients will experience some swelling and bruising in the breast.  Ice packs and a good support bra will help.  Swelling and bruising can take several days to resolve.  ?It is common to experience some constipation if taking pain medication after surgery.  Increasing fluid intake and taking a stool softener will usually help or prevent this problem from occurring.  A mild laxative (Milk of Magnesia or Miralax) should be taken according to package directions if there are no bowel movements after 48 hours. ?Unless discharge instructions indicate otherwise, you may remove your bandages 24-48 hours after surgery, and you may shower at that time.  You may have steri-strips (small skin tapes) in place directly over the incision.  These strips should be left on the skin for 7-10 days.  If your surgeon used skin glue on the incision, you may shower in 24 hours.  The glue will flake off over the next 2-3 weeks.  Any  sutures or staples will be removed at the office during your follow-up visit. ?ACTIVITIES:  You may resume regular daily activities (gradually increasing) beginning the next day.  Wearing a good support bra or sports bra minimizes pain and swelling.  You may have sexual intercourse when it is comfortable. ?You may drive when you no longer are taking prescription pain medication, you can comfortably wear a seatbelt, and you can safely maneuver your car and apply brakes. ?RETURN TO WORK:  ______________________________________________________________________________________ ?You should see your doctor in the office for a follow-up appointment approximately two weeks after your surgery.  Your doctor?s nurse will typically make your follow-up appointment when she calls you with your pathology report.  Expect your pathology report 2-3 business days after your surgery.  You may call to check if you do not hear from [place] after three days. ?OTHER INSTRUCTIONS: ok to shower starting tomorrow ?Ice pack, tylenol, and ibuprofen also for pain ?_______________________________________________________________________________________________ _____________________________________________________________________________________________________________________________________ ?_____________________________________________________________________________________________________________________________________ ?_____________________________________________________________________________________________________________________________________ ? ?WHEN TO CALL YOUR DOCTOR: ?Fever over 101.0 ?Nausea and/or vomiting. ?Extreme swelling or bruising. ?Continued bleeding from incision. ?Increased pain, redness, or drainage from the incision. ? ?The clinic staff is available to answer your questions during regular business hours.  Please don?t hesitate to call and ask to speak to one of the nurses for clinical concerns.  If you have a medical  emergency, go to the nearest emergency room or call 911.  A surgeon from [facility] is always on call at the hospital. ? ?For further questions, please visit [email or web address]   ?Regional Anesthesia Blocks ? ?  1. Numbness or the inability to move the "blocked" extremity may last from 3-48 hours after placement. The length of time depends on the medication injected and your individual response to the medication. If the numbness is not going away after 48 hours, call your surgeon. ? ?2. The extremity that is blocked will need to be protected until the numbness is gone and the  Strength has returned. Because you cannot feel it, you will need to take extra care to avoid injury. Because it may be weak, you may have difficulty moving it or using it. You may not know what position it is in without looking at it while the block is in effect. ? ?3. For blocks in the legs and feet, returning to weight bearing and walking needs to be done carefully. You will need to wait until the numbness is entirely gone and the strength has returned. You should be able to move your leg and foot normally before you try and bear weight or walk. You will need someone to be with you when you first try to ensure you do not fall and possibly risk injury. ? ?4. Bruising and tenderness at the needle site are common side effects and will resolve in a few days. ? ?5. Persistent numbness or new problems with movement should be communicated to the surgeon or the [facility] [phone] [place] [phone]).  ?*Received Tylenol 500 mg at 12:11 p.m. ?

## 2021-04-15 ENCOUNTER — Encounter (HOSPITAL_BASED_OUTPATIENT_CLINIC_OR_DEPARTMENT_OTHER): Payer: Self-pay | Admitting: Surgery

## 2021-04-18 LAB — SURGICAL PATHOLOGY

## 2021-05-26 ENCOUNTER — Encounter (HOSPITAL_COMMUNITY): Payer: Self-pay

## 2023-08-28 ENCOUNTER — Ambulatory Visit (INDEPENDENT_AMBULATORY_CARE_PROVIDER_SITE_OTHER): Admitting: Orthopedic Surgery

## 2023-08-28 DIAGNOSIS — L97929 Non-pressure chronic ulcer of unspecified part of left lower leg with unspecified severity: Secondary | ICD-10-CM

## 2023-08-28 DIAGNOSIS — I83029 Varicose veins of left lower extremity with ulcer of unspecified site: Secondary | ICD-10-CM | POA: Diagnosis not present

## 2023-08-29 ENCOUNTER — Telehealth: Payer: Self-pay | Admitting: Orthopedic Surgery

## 2023-08-29 NOTE — Telephone Encounter (Signed)
 Katie (RN) [facility] called stating pt refused compression socks and measurements for compression. Pt has a sign in room no compression socks and dr duda order socks. Katie secure number is [phone].

## 2023-08-31 NOTE — Telephone Encounter (Signed)
 SW Izetta about pt refusal for measuring for Juxta Lite compression velcro wrap.and She has compression socks that she refuses to wear. This is just an BURUNDI.   Izetta was not able to measure.

## 2023-09-04 ENCOUNTER — Ambulatory Visit: Admitting: Orthopedic Surgery

## 2023-09-04 ENCOUNTER — Ambulatory Visit (INDEPENDENT_AMBULATORY_CARE_PROVIDER_SITE_OTHER): Admitting: Orthopedic Surgery

## 2023-09-04 ENCOUNTER — Encounter: Payer: Self-pay | Admitting: Orthopedic Surgery

## 2023-09-04 DIAGNOSIS — I87332 Chronic venous hypertension (idiopathic) with ulcer and inflammation of left lower extremity: Secondary | ICD-10-CM

## 2023-09-04 NOTE — Progress Notes (Signed)
 Office Visit Note   Patient: Chelsea [name]           Date of Birth: 1928-07-10           MRN: [id number] Visit Date: 09/04/2023              Requested by: Lanny Callander, MD [address] [place],  [place] [address] PCP: Lanny Callander, MD  Chief Complaint  Patient presents with   Left Leg - Wound Check      HPI: Patient is a 89 year old woman who is seen in follow-up for venous lymphatic insufficiency ulceration posterior left calf  Assessment & Plan: Visit Diagnoses:  1. Chronic venous hypertension (idiopathic) with ulcer and inflammation of left lower extremity (HCC)     Plan: Will reapply 1 additional Dynaflex wrap and then patient will resume wearing her compression socks.  Follow-Up Instructions: Return in about 1 week (around 09/11/2023).   Ortho Exam  Patient is alert, oriented, no adenopathy, well-dressed, normal affect, normal respiratory effort. Examination of the skin is wrinkling well she has significant decrease swelling in the left lower extremity.  The venous ulcer is flat healthy and stable 1 cm in diameter posterior left calf    Imaging: No results found. No images are attached to the encounter.  Labs: Lab Results  Component Value Date   HGBA1C 7.1 (A) 09/21/2017   HGBA1C 6.8 (H) 04/17/2017   HGBA1C 7.2 (H) 10/20/2014   ESRSEDRATE 9 04/17/2017   ESRSEDRATE 11 02/04/2009   CRP 3.4 04/17/2017   CRP 0.8 (H) 02/04/2009   REPTSTATUS 05/18/2017 FINAL 05/12/2017   GRAMSTAIN  03/08/2017    NO WBC SEEN FEW GRAM NEGATIVE RODS FEW GRAM POSITIVE COCCI IN PAIRS RARE GRAM POSITIVE RODS Performed at [facility], [address], [place], [place] [address]    CULT  05/12/2017    NO GROWTH 5 DAYS Performed at [facility], [address] [place], [place] [address]    IDOLINA PROTEUS MIRABILIS 03/08/2017   LABORGA ENTEROCOCCUS FAECALIS 03/08/2017     Lab Results  Component Value Date   ALBUMIN 3.4 (L) 09/25/2019   ALBUMIN 2.2 (L)  05/29/2017   ALBUMIN 2.2 (L) 05/28/2017    Lab Results  Component Value Date   MG 1.2 (L) 05/29/2017   MG 1.7 05/18/2017   MG 1.8 05/17/2017   No results found for: VD25OH  No results found for: PREALBUMIN    Latest Ref Rng & Units 02/12/2020    9:31 AM 09/25/2019   10:02 AM 09/26/2017    8:47 AM  CBC EXTENDED  WBC 4.0 - 10.5 K/uL 5.0  5.7  5.4   RBC 3.87 - 5.11 MIL/uL 5.34  5.02  5.03   Hemoglobin 12.0 - 15.0 g/dL 84.5  85.7  85.6   HCT 36.0 - 46.0 % 49.3  43.9  44.4   Platelets 150 - 400 K/uL 120  138  153   NEUT# 1.7 - 7.7 K/uL  3.4    Lymph# 0.7 - 4.0 K/uL  1.6       There is no height or weight on file to calculate BMI.  Orders:  No orders of the defined types were placed in this encounter.  No orders of the defined types were placed in this encounter.    Procedures: No procedures performed  Clinical Data: No additional findings.  ROS:  All other systems negative, except as noted in the HPI. Review of Systems  Objective: Vital  Signs: LMP  (LMP Unknown)   Specialty Comments:  No specialty comments available.  PMFS History: Patient Active Problem List   Diagnosis Date Noted   Closed fracture dislocation of left ankle joint    Pressure injury of skin 05/16/2017   Acute kidney failure (HCC)    Acute on chronic combined systolic and diastolic CHF (congestive heart failure) (HCC)    NSTEMI (non-ST elevated myocardial infarction) (HCC) 05/13/2017   HTN (hypertension) 05/13/2017   Diabetes mellitus (HCC) 04/10/2017   Venous stasis dermatitis of both lower extremities 04/10/2017   Neuropathy 04/10/2017   Chronic osteomyelitis of left foot (HCC) 04/10/2017   Breast cancer of upper-inner quadrant of right female breast (HCC) 10/05/2014   Occult blood positive stool 03/24/2014   History of ITP 04/14/2011   Past Medical History:  Diagnosis Date   Anemia    takes Ferrous Sulfate  daily   Arthritis    Basal cell carcinoma of breast 1998   Bruises  easily    Dizziness    DM (diabetes mellitus) (HCC)    type 2   Eczema    Emphysema lung (HCC)    GERD (gastroesophageal reflux disease)    takes Protonix  daily   Glaucoma    Bil   History of hiatal hernia    HTN (hypertension)    takes Diovan daily   Hyperlipidemia    ITP (idiopathic thrombocytopenic purpura) 2/09   Myocardial infarction (HCC)    NSTEMI 05/2017; mild CAD with normalization of EF 07/2017 LHC, recovered stress-induced cardiomyopathy   Nocturia    Osteoporosis    Peripheral edema    Stroke (HCC) 02/03/09   mid brain, diplopia   Walker as ambulation aid    and also uses w/c for long distances    Family History  Problem Relation Age of Onset   Breast cancer Mother 47   Colon cancer Father    Diabetes Father        questionable   Multiple sclerosis Daughter    Heart disease Maternal Grandfather     Past Surgical History:  Procedure Laterality Date   ANKLE FUSION Left 09/26/2017   Procedure: LEFT TIB-CALC FUSION;  Surgeon: Harden Jerona GAILS, MD;  Location: [facility];  Service: Orthopedics;  Laterality: Left;   BREAST EXCISIONAL BIOPSY     BREAST LUMPECTOMY     BREAST LUMPECTOMY Right 04/14/2021   Procedure: RIGHT BREAST LUMPECTOMY;  Surgeon: Vernetta Berg, MD;  Location: [facility];  Service: General;  Laterality: Right;   BREAST LUMPECTOMY WITH RADIOACTIVE SEED LOCALIZATION Right 10/23/2014   Procedure: RIGHT BREAST LUMPECTOMY WITH RADIOACTIVE SEED LOCALIZATION;  Surgeon: Elon Pacini, MD;  Location: [facility];  Service: General;  Laterality: Right;   BREAST LUMPECTOMY WITH RADIOACTIVE SEED LOCALIZATION Right 02/12/2020   Procedure: RIGHT BREAST LUMPECTOMY WITH RADIOACTIVE SEED LOCALIZATION;  Surgeon: Vernetta Berg, MD;  Location: [facility];  Service: General;  Laterality: Right;   CATARACT EXTRACTION Bilateral    COLONOSCOPY     ESOPHAGOGASTRODUODENOSCOPY     LEFT HEART CATH AND CORONARY ANGIOGRAPHY N/A 07/09/2017   Procedure: LEFT HEART CATH AND  CORONARY ANGIOGRAPHY;  Surgeon: Dann Candyce RAMAN, MD;  Location: [facility] INVASIVE CV LAB;  Service: Cardiovascular;  Laterality: N/A;   MYOMECTOMY N/A 03/14/1995   late 90's per pt   PARATHYROIDECTOMY  2008   REFRACTIVE SURGERY Bilateral    TONSILLECTOMY  as a child   Social History   Occupational History   Not on file  Tobacco Use  Smoking status: Never   Smokeless tobacco: Never  Vaping Use   Vaping status: Never Used  Substance and Sexual Activity   Alcohol use: No    Alcohol/week: 0.0 standard drinks of alcohol   Drug use: No   Sexual activity: Not on file

## 2023-09-13 ENCOUNTER — Ambulatory Visit (INDEPENDENT_AMBULATORY_CARE_PROVIDER_SITE_OTHER): Admitting: Orthopedic Surgery

## 2023-09-13 ENCOUNTER — Encounter: Payer: Self-pay | Admitting: Orthopedic Surgery

## 2023-09-13 DIAGNOSIS — I83029 Varicose veins of left lower extremity with ulcer of unspecified site: Secondary | ICD-10-CM

## 2023-09-13 DIAGNOSIS — I87332 Chronic venous hypertension (idiopathic) with ulcer and inflammation of left lower extremity: Secondary | ICD-10-CM

## 2023-09-13 DIAGNOSIS — L97929 Non-pressure chronic ulcer of unspecified part of left lower leg with unspecified severity: Secondary | ICD-10-CM

## 2023-09-13 NOTE — Progress Notes (Signed)
 Office Visit Note   Patient: Chelsea [name]           Date of Birth: 1928-08-17           MRN: [id number] Visit Date: 08/28/2023              Requested by: Lanny Callander, MD [address] [place],  [place] [address] PCP: Lanny Callander, MD  Chief Complaint  Patient presents with   Left Leg - Edema      HPI: Patient is a 89 year old woman who presents for evaluation for venous insufficiency ulcer left lower extremity.  Assessment & Plan: Visit Diagnoses:  1. Venous ulcer of left lower extremity with varicose veins (HCC)     Plan: Patient has a small traumatic venous ulcer.  Multilayer compression applied follow-up in 1 week.  Follow-Up Instructions: No follow-ups on file.   Ortho Exam  Patient is alert, oriented, no adenopathy, well-dressed, normal affect, normal respiratory effort. Examination patient has pitting edema of the left lower extremity there is weeping edema with a small traumatic venous ulcer posterior laterally.  No cellulitis no signs of infection.    Imaging: No results found.    Labs: Lab Results  Component Value Date   HGBA1C 7.1 (A) 09/21/2017   HGBA1C 6.8 (H) 04/17/2017   HGBA1C 7.2 (H) 10/20/2014   ESRSEDRATE 9 04/17/2017   ESRSEDRATE 11 02/04/2009   CRP 3.4 04/17/2017   CRP 0.8 (H) 02/04/2009   REPTSTATUS 05/18/2017 FINAL 05/12/2017   GRAMSTAIN  03/08/2017    NO WBC SEEN FEW GRAM NEGATIVE RODS FEW GRAM POSITIVE COCCI IN PAIRS RARE GRAM POSITIVE RODS Performed at [facility], [address], [place], [place] [address]    CULT  05/12/2017    NO GROWTH 5 DAYS Performed at [facility], [address] [place], [place] [address]    IDOLINA PROTEUS MIRABILIS 03/08/2017   LABORGA ENTEROCOCCUS FAECALIS 03/08/2017     Lab Results  Component Value Date   ALBUMIN 3.4 (L) 09/25/2019   ALBUMIN 2.2 (L) 05/29/2017   ALBUMIN 2.2 (L) 05/28/2017    Lab Results  Component Value Date   MG 1.2 (L) 05/29/2017   MG 1.7  05/18/2017   MG 1.8 05/17/2017   No results found for: VD25OH  No results found for: PREALBUMIN    Latest Ref Rng & Units 02/12/2020    9:31 AM 09/25/2019   10:02 AM 09/26/2017    8:47 AM  CBC EXTENDED  WBC 4.0 - 10.5 K/uL 5.0  5.7  5.4   RBC 3.87 - 5.11 MIL/uL 5.34  5.02  5.03   Hemoglobin 12.0 - 15.0 g/dL 84.5  85.7  85.6   HCT 36.0 - 46.0 % 49.3  43.9  44.4   Platelets 150 - 400 K/uL 120  138  153   NEUT# 1.7 - 7.7 K/uL  3.4    Lymph# 0.7 - 4.0 K/uL  1.6       There is no height or weight on file to calculate BMI.  Orders:  No orders of the defined types were placed in this encounter.  No orders of the defined types were placed in this encounter.    Procedures: No procedures performed  Clinical Data: No additional findings.  ROS:  All other systems negative, except as noted in the HPI. Review of Systems  Objective: Vital Signs: LMP  (LMP Unknown)   Specialty Comments:  No specialty comments available.  PMFS History: Patient  Active Problem List   Diagnosis Date Noted   Closed fracture dislocation of left ankle joint    Pressure injury of skin 05/16/2017   Acute kidney failure (HCC)    Acute on chronic combined systolic and diastolic CHF (congestive heart failure) (HCC)    NSTEMI (non-ST elevated myocardial infarction) (HCC) 05/13/2017   HTN (hypertension) 05/13/2017   Diabetes mellitus (HCC) 04/10/2017   Venous stasis dermatitis of both lower extremities 04/10/2017   Neuropathy 04/10/2017   Chronic osteomyelitis of left foot (HCC) 04/10/2017   Breast cancer of upper-inner quadrant of right female breast (HCC) 10/05/2014   Occult blood positive stool 03/24/2014   History of ITP 04/14/2011   Past Medical History:  Diagnosis Date   Anemia    takes Ferrous Sulfate  daily   Arthritis    Basal cell carcinoma of breast 1998   Bruises easily    Dizziness    DM (diabetes mellitus) (HCC)    type 2   Eczema    Emphysema lung (HCC)    GERD  (gastroesophageal reflux disease)    takes Protonix  daily   Glaucoma    Bil   History of hiatal hernia    HTN (hypertension)    takes Diovan daily   Hyperlipidemia    ITP (idiopathic thrombocytopenic purpura) 2/09   Myocardial infarction (HCC)    NSTEMI 05/2017; mild CAD with normalization of EF 07/2017 LHC, recovered stress-induced cardiomyopathy   Nocturia    Osteoporosis    Peripheral edema    Stroke (HCC) 02/03/09   mid brain, diplopia   Walker as ambulation aid    and also uses w/c for long distances    Family History  Problem Relation Age of Onset   Breast cancer Mother 50   Colon cancer Father    Diabetes Father        questionable   Multiple sclerosis Daughter    Heart disease Maternal Grandfather     Past Surgical History:  Procedure Laterality Date   ANKLE FUSION Left 09/26/2017   Procedure: LEFT TIB-CALC FUSION;  Surgeon: Harden Jerona GAILS, MD;  Location: [facility];  Service: Orthopedics;  Laterality: Left;   BREAST EXCISIONAL BIOPSY     BREAST LUMPECTOMY     BREAST LUMPECTOMY Right 04/14/2021   Procedure: RIGHT BREAST LUMPECTOMY;  Surgeon: Vernetta Berg, MD;  Location: [facility];  Service: General;  Laterality: Right;   BREAST LUMPECTOMY WITH RADIOACTIVE SEED LOCALIZATION Right 10/23/2014   Procedure: RIGHT BREAST LUMPECTOMY WITH RADIOACTIVE SEED LOCALIZATION;  Surgeon: Elon Pacini, MD;  Location: [facility];  Service: General;  Laterality: Right;   BREAST LUMPECTOMY WITH RADIOACTIVE SEED LOCALIZATION Right 02/12/2020   Procedure: RIGHT BREAST LUMPECTOMY WITH RADIOACTIVE SEED LOCALIZATION;  Surgeon: Vernetta Berg, MD;  Location: [facility];  Service: General;  Laterality: Right;   CATARACT EXTRACTION Bilateral    COLONOSCOPY     ESOPHAGOGASTRODUODENOSCOPY     LEFT HEART CATH AND CORONARY ANGIOGRAPHY N/A 07/09/2017   Procedure: LEFT HEART CATH AND CORONARY ANGIOGRAPHY;  Surgeon: Dann Candyce RAMAN, MD;  Location: [facility] INVASIVE CV LAB;  Service: Cardiovascular;   Laterality: N/A;   MYOMECTOMY N/A 03/14/1995   late 90's per pt   PARATHYROIDECTOMY  2008   REFRACTIVE SURGERY Bilateral    TONSILLECTOMY  as a child   Social History   Occupational History   Not on file  Tobacco Use   Smoking status: Never   Smokeless tobacco: Never  Vaping Use   Vaping status: Never Used  Substance and Sexual Activity   Alcohol use: No    Alcohol/week: 0.0 standard drinks of alcohol   Drug use: No   Sexual activity: Not on file

## 2023-09-17 ENCOUNTER — Encounter: Payer: Self-pay | Admitting: Orthopedic Surgery

## 2023-09-17 NOTE — Progress Notes (Signed)
 Office Visit Note   Patient: Chelsea [name]           Date of Birth: 1928/02/13           MRN: [id number] Visit Date: 09/13/2023              Requested by: Lanny Callander, MD [address] [place],  [place] [address] PCP: Lanny Callander, MD  Chief Complaint  Patient presents with   Left Leg - Follow-up      HPI: Patient is a 89 year old woman who presents in follow-up for venous ulceration left lower extremity.  She has been in compression wraps.  Assessment & Plan: Visit Diagnoses:  1. Chronic venous hypertension (idiopathic) with ulcer and inflammation of left lower extremity (HCC)   2. Venous ulcer of left lower extremity with varicose veins (HCC)     Plan: Patient will resume the Vive wear compression sock.  Follow-Up Instructions: Return if symptoms worsen or fail to improve.   Ortho Exam  Patient is alert, oriented, no adenopathy, well-dressed, normal affect, normal respiratory effort. The skin is wrinkling well the ulcer is healed there is no cellulitis no drainage.    Imaging: No results found. No images are attached to the encounter.  Labs: Lab Results  Component Value Date   HGBA1C 7.1 (A) 09/21/2017   HGBA1C 6.8 (H) 04/17/2017   HGBA1C 7.2 (H) 10/20/2014   ESRSEDRATE 9 04/17/2017   ESRSEDRATE 11 02/04/2009   CRP 3.4 04/17/2017   CRP 0.8 (H) 02/04/2009   REPTSTATUS 05/18/2017 FINAL 05/12/2017   GRAMSTAIN  03/08/2017    NO WBC SEEN FEW GRAM NEGATIVE RODS FEW GRAM POSITIVE COCCI IN PAIRS RARE GRAM POSITIVE RODS Performed at [facility], [address], [place], [place] [address]    CULT  05/12/2017    NO GROWTH 5 DAYS Performed at [facility], [address], [place], [place] [address]    IDOLINA PROTEUS MIRABILIS 03/08/2017   LABORGA ENTEROCOCCUS FAECALIS 03/08/2017     Lab Results  Component Value Date   ALBUMIN 3.4 (L) 09/25/2019   ALBUMIN 2.2 (L) 05/29/2017   ALBUMIN 2.2 (L) 05/28/2017    Lab Results  Component Value  Date   MG 1.2 (L) 05/29/2017   MG 1.7 05/18/2017   MG 1.8 05/17/2017   No results found for: VD25OH  No results found for: PREALBUMIN    Latest Ref Rng & Units 02/12/2020    9:31 AM 09/25/2019   10:02 AM 09/26/2017    8:47 AM  CBC EXTENDED  WBC 4.0 - 10.5 K/uL 5.0  5.7  5.4   RBC 3.87 - 5.11 MIL/uL 5.34  5.02  5.03   Hemoglobin 12.0 - 15.0 g/dL 84.5  85.7  85.6   HCT 36.0 - 46.0 % 49.3  43.9  44.4   Platelets 150 - 400 K/uL 120  138  153   NEUT# 1.7 - 7.7 K/uL  3.4    Lymph# 0.7 - 4.0 K/uL  1.6       There is no height or weight on file to calculate BMI.  Orders:  No orders of the defined types were placed in this encounter.  No orders of the defined types were placed in this encounter.    Procedures: No procedures performed  Clinical Data: No additional findings.  ROS:  All other systems negative, except as noted in the HPI. Review of Systems  Objective: Vital Signs: LMP  (LMP Unknown)   Specialty  Comments:  No specialty comments available.  PMFS History: Patient Active Problem List   Diagnosis Date Noted   Closed fracture dislocation of left ankle joint    Pressure injury of skin 05/16/2017   Acute kidney failure (HCC)    Acute on chronic combined systolic and diastolic CHF (congestive heart failure) (HCC)    NSTEMI (non-ST elevated myocardial infarction) (HCC) 05/13/2017   HTN (hypertension) 05/13/2017   Diabetes mellitus (HCC) 04/10/2017   Venous stasis dermatitis of both lower extremities 04/10/2017   Neuropathy 04/10/2017   Chronic osteomyelitis of left foot (HCC) 04/10/2017   Breast cancer of upper-inner quadrant of right female breast (HCC) 10/05/2014   Occult blood positive stool 03/24/2014   History of ITP 04/14/2011   Past Medical History:  Diagnosis Date   Anemia    takes Ferrous Sulfate  daily   Arthritis    Basal cell carcinoma of breast 1998   Bruises easily    Dizziness    DM (diabetes mellitus) (HCC)    type 2   Eczema     Emphysema lung (HCC)    GERD (gastroesophageal reflux disease)    takes Protonix  daily   Glaucoma    Bil   History of hiatal hernia    HTN (hypertension)    takes Diovan daily   Hyperlipidemia    ITP (idiopathic thrombocytopenic purpura) 2/09   Myocardial infarction (HCC)    NSTEMI 05/2017; mild CAD with normalization of EF 07/2017 LHC, recovered stress-induced cardiomyopathy   Nocturia    Osteoporosis    Peripheral edema    Stroke (HCC) 02/03/09   mid brain, diplopia   Walker as ambulation aid    and also uses w/c for long distances    Family History  Problem Relation Age of Onset   Breast cancer Mother 87   Colon cancer Father    Diabetes Father        questionable   Multiple sclerosis Daughter    Heart disease Maternal Grandfather     Past Surgical History:  Procedure Laterality Date   ANKLE FUSION Left 09/26/2017   Procedure: LEFT TIB-CALC FUSION;  Surgeon: Harden Jerona GAILS, MD;  Location: [facility];  Service: Orthopedics;  Laterality: Left;   BREAST EXCISIONAL BIOPSY     BREAST LUMPECTOMY     BREAST LUMPECTOMY Right 04/14/2021   Procedure: RIGHT BREAST LUMPECTOMY;  Surgeon: Vernetta Berg, MD;  Location: [facility];  Service: General;  Laterality: Right;   BREAST LUMPECTOMY WITH RADIOACTIVE SEED LOCALIZATION Right 10/23/2014   Procedure: RIGHT BREAST LUMPECTOMY WITH RADIOACTIVE SEED LOCALIZATION;  Surgeon: Elon Pacini, MD;  Location: [facility];  Service: General;  Laterality: Right;   BREAST LUMPECTOMY WITH RADIOACTIVE SEED LOCALIZATION Right 02/12/2020   Procedure: RIGHT BREAST LUMPECTOMY WITH RADIOACTIVE SEED LOCALIZATION;  Surgeon: Vernetta Berg, MD;  Location: [facility];  Service: General;  Laterality: Right;   CATARACT EXTRACTION Bilateral    COLONOSCOPY     ESOPHAGOGASTRODUODENOSCOPY     LEFT HEART CATH AND CORONARY ANGIOGRAPHY N/A 07/09/2017   Procedure: LEFT HEART CATH AND CORONARY ANGIOGRAPHY;  Surgeon: Dann Candyce RAMAN, MD;  Location: [facility] INVASIVE CV  LAB;  Service: Cardiovascular;  Laterality: N/A;   MYOMECTOMY N/A 03/14/1995   late 90's per pt   PARATHYROIDECTOMY  2008   REFRACTIVE SURGERY Bilateral    TONSILLECTOMY  as a child   Social History   Occupational History   Not on file  Tobacco Use   Smoking status: Never   Smokeless tobacco:  Never  Vaping Use   Vaping status: Never Used  Substance and Sexual Activity   Alcohol use: No    Alcohol/week: 0.0 standard drinks of alcohol   Drug use: No   Sexual activity: Not on file

## 2023-12-10 ENCOUNTER — Encounter: Payer: Self-pay | Admitting: Radiology
